# Patient Record
Sex: Male | Born: 1960
Health system: Southern US, Community
[De-identification: ages and names within clinical notes are randomized; demographics above are authoritative.]

## PROBLEM LIST (undated history)

## (undated) DIAGNOSIS — E119 Type 2 diabetes mellitus without complications: Secondary | ICD-10-CM

## (undated) DIAGNOSIS — E7800 Pure hypercholesterolemia, unspecified: Secondary | ICD-10-CM

## (undated) DIAGNOSIS — I513 Intracardiac thrombosis, not elsewhere classified: Secondary | ICD-10-CM

## (undated) DIAGNOSIS — I214 Non-ST elevation (NSTEMI) myocardial infarction: Secondary | ICD-10-CM

---

## 2016-02-21 ENCOUNTER — Emergency Department (HOSPITAL_COMMUNITY): Payer: Self-pay

## 2016-02-21 ENCOUNTER — Other Ambulatory Visit: Payer: Self-pay

## 2016-02-21 ENCOUNTER — Inpatient Hospital Stay (HOSPITAL_COMMUNITY)
Admission: EM | Admit: 2016-02-21 | Discharge: 2016-03-16 | DRG: 246 | Disposition: A | Discharge status: Home Health | Payer: Self-pay | Attending: Internal Medicine | Admitting: Internal Medicine

## 2016-02-21 ENCOUNTER — Emergency Department (HOSPITAL_COMMUNITY): Payer: MEDICAID

## 2016-02-21 ENCOUNTER — Inpatient Hospital Stay (HOSPITAL_COMMUNITY): Payer: MEDICAID | Admitting: Anesthesiology

## 2016-02-21 ENCOUNTER — Inpatient Hospital Stay (HOSPITAL_COMMUNITY): Payer: Self-pay | Admitting: Anesthesiology

## 2016-02-21 ENCOUNTER — Encounter (HOSPITAL_COMMUNITY)
Admission: EM | Disposition: A | Discharge status: Home Health | Payer: Self-pay | Source: Home / Self Care | Attending: Internal Medicine

## 2016-02-21 ENCOUNTER — Encounter (HOSPITAL_COMMUNITY): Payer: Self-pay | Admitting: *Deleted

## 2016-02-21 DIAGNOSIS — Z419 Encounter for procedure for purposes other than remedying health state, unspecified: Secondary | ICD-10-CM

## 2016-02-21 DIAGNOSIS — I998 Other disorder of circulatory system: Secondary | ICD-10-CM

## 2016-02-21 DIAGNOSIS — R079 Chest pain, unspecified: Secondary | ICD-10-CM

## 2016-02-21 DIAGNOSIS — R109 Unspecified abdominal pain: Secondary | ICD-10-CM | POA: Insufficient documentation

## 2016-02-21 DIAGNOSIS — M79A21 Nontraumatic compartment syndrome of right lower extremity: Secondary | ICD-10-CM | POA: Diagnosis present

## 2016-02-21 DIAGNOSIS — R509 Fever, unspecified: Secondary | ICD-10-CM | POA: Insufficient documentation

## 2016-02-21 DIAGNOSIS — I513 Intracardiac thrombosis, not elsewhere classified: Secondary | ICD-10-CM | POA: Diagnosis present

## 2016-02-21 DIAGNOSIS — Y838 Other surgical procedures as the cause of abnormal reaction of the patient, or of later complication, without mention of misadventure at the time of the procedure: Secondary | ICD-10-CM | POA: Diagnosis present

## 2016-02-21 DIAGNOSIS — R9431 Abnormal electrocardiogram [ECG] [EKG]: Secondary | ICD-10-CM | POA: Diagnosis present

## 2016-02-21 DIAGNOSIS — Z9889 Other specified postprocedural states: Secondary | ICD-10-CM

## 2016-02-21 DIAGNOSIS — R7989 Other specified abnormal findings of blood chemistry: Secondary | ICD-10-CM | POA: Diagnosis present

## 2016-02-21 DIAGNOSIS — I2109 ST elevation (STEMI) myocardial infarction involving other coronary artery of anterior wall: Secondary | ICD-10-CM | POA: Diagnosis present

## 2016-02-21 DIAGNOSIS — I5041 Acute combined systolic (congestive) and diastolic (congestive) heart failure: Secondary | ICD-10-CM | POA: Insufficient documentation

## 2016-02-21 DIAGNOSIS — E113393 Type 2 diabetes mellitus with moderate nonproliferative diabetic retinopathy without macular edema, bilateral: Secondary | ICD-10-CM

## 2016-02-21 DIAGNOSIS — E119 Type 2 diabetes mellitus without complications: Secondary | ICD-10-CM

## 2016-02-21 DIAGNOSIS — R9389 Abnormal findings on diagnostic imaging of other specified body structures: Secondary | ICD-10-CM | POA: Insufficient documentation

## 2016-02-21 DIAGNOSIS — I97638 Postprocedural hematoma of a circulatory system organ or structure following other circulatory system procedure: Secondary | ICD-10-CM | POA: Diagnosis not present

## 2016-02-21 DIAGNOSIS — I219 Acute myocardial infarction, unspecified: Secondary | ICD-10-CM | POA: Diagnosis present

## 2016-02-21 DIAGNOSIS — I252 Old myocardial infarction: Secondary | ICD-10-CM

## 2016-02-21 DIAGNOSIS — K8020 Calculus of gallbladder without cholecystitis without obstruction: Secondary | ICD-10-CM | POA: Diagnosis present

## 2016-02-21 DIAGNOSIS — I743 Embolism and thrombosis of arteries of the lower extremities: Secondary | ICD-10-CM

## 2016-02-21 DIAGNOSIS — I214 Non-ST elevation (NSTEMI) myocardial infarction: Secondary | ICD-10-CM | POA: Diagnosis present

## 2016-02-21 DIAGNOSIS — M79604 Pain in right leg: Secondary | ICD-10-CM | POA: Diagnosis present

## 2016-02-21 DIAGNOSIS — Z955 Presence of coronary angioplasty implant and graft: Secondary | ICD-10-CM | POA: Insufficient documentation

## 2016-02-21 DIAGNOSIS — I749 Embolism and thrombosis of unspecified artery: Secondary | ICD-10-CM | POA: Insufficient documentation

## 2016-02-21 DIAGNOSIS — D62 Acute posthemorrhagic anemia: Secondary | ICD-10-CM | POA: Diagnosis not present

## 2016-02-21 DIAGNOSIS — R52 Pain, unspecified: Secondary | ICD-10-CM | POA: Insufficient documentation

## 2016-02-21 DIAGNOSIS — R55 Syncope and collapse: Secondary | ICD-10-CM | POA: Diagnosis not present

## 2016-02-21 DIAGNOSIS — Z87891 Personal history of nicotine dependence: Secondary | ICD-10-CM

## 2016-02-21 DIAGNOSIS — K5900 Constipation, unspecified: Secondary | ICD-10-CM | POA: Diagnosis not present

## 2016-02-21 DIAGNOSIS — E871 Hypo-osmolality and hyponatremia: Secondary | ICD-10-CM | POA: Diagnosis present

## 2016-02-21 DIAGNOSIS — E1165 Type 2 diabetes mellitus with hyperglycemia: Secondary | ICD-10-CM | POA: Diagnosis present

## 2016-02-21 DIAGNOSIS — R778 Other specified abnormalities of plasma proteins: Secondary | ICD-10-CM | POA: Diagnosis present

## 2016-02-21 HISTORY — DX: Intracardiac thrombosis, not elsewhere classified: I51.3

## 2016-02-21 HISTORY — DX: Type 2 diabetes mellitus without complications: E11.9

## 2016-02-21 HISTORY — PX: EMBOLECTOMY: SHX44

## 2016-02-21 HISTORY — DX: Non-ST elevation (NSTEMI) myocardial infarction: I21.4

## 2016-02-21 LAB — CBC WITH DIFFERENTIAL/PLATELET
BASOS ABS: 0 10*3/uL (ref 0.0–0.1)
BASOS PCT: 0 %
Eosinophils Absolute: 0 10*3/uL (ref 0.0–0.7)
Eosinophils Relative: 0 %
HCT: 42.8 % (ref 39.0–52.0)
HEMOGLOBIN: 15.5 g/dL (ref 13.0–17.0)
Lymphocytes Relative: 17 %
Lymphs Abs: 1.7 10*3/uL (ref 0.7–4.0)
MCH: 29.9 pg (ref 26.0–34.0)
MCHC: 36.2 g/dL — ABNORMAL HIGH (ref 30.0–36.0)
MCV: 82.6 fL (ref 78.0–100.0)
MONO ABS: 0.4 10*3/uL (ref 0.1–1.0)
Monocytes Relative: 4 %
NEUTROS ABS: 7.9 10*3/uL — AB (ref 1.7–7.7)
NEUTROS PCT: 79 %
Platelets: 250 10*3/uL (ref 150–400)
RBC: 5.18 MIL/uL (ref 4.22–5.81)
RDW: 12 % (ref 11.5–15.5)
WBC: 10.1 10*3/uL (ref 4.0–10.5)

## 2016-02-21 LAB — MRSA PCR SCREENING: MRSA BY PCR: NEGATIVE

## 2016-02-21 LAB — ECHOCARDIOGRAM COMPLETE
E decel time: 183 msec
E/e' ratio: 13.93
FS: 26 % — AB (ref 28–44)
IV/PV OW: 1.1
LA vol A4C: 36.8 ml
LADIAMINDEX: 1.74 cm/m2
LASIZE: 36 mm
LAVOL: 37.9 mL
LAVOLIN: 18.3 mL/m2
LEFT ATRIUM END SYS DIAM: 36 mm
LV E/e'average: 13.93
LV PW d: 13.7 mm — AB (ref 0.6–1.1)
LVEEMED: 13.93
LVELAT: 7.07 cm/s
MV Dec: 183
MV pk E vel: 98.5 m/s
MVPG: 4 mmHg
MVPKAVEL: 88.3 m/s
P 1/2 time: 439 ms
TDI e' lateral: 7.07
TDI e' medial: 4.03

## 2016-02-21 LAB — BASIC METABOLIC PANEL
Anion gap: 7 (ref 5–15)
BUN: 14 mg/dL (ref 6–20)
CALCIUM: 8.6 mg/dL — AB (ref 8.9–10.3)
CHLORIDE: 100 mmol/L — AB (ref 101–111)
CO2: 25 mmol/L (ref 22–32)
Creatinine, Ser: 0.94 mg/dL (ref 0.61–1.24)
Glucose, Bld: 216 mg/dL — ABNORMAL HIGH (ref 65–99)
Potassium: 4.5 mmol/L (ref 3.5–5.1)
SODIUM: 132 mmol/L — AB (ref 135–145)

## 2016-02-21 LAB — I-STAT TROPONIN, ED: Troponin i, poc: 0.85 ng/mL (ref 0.00–0.08)

## 2016-02-21 LAB — APTT: aPTT: 25 seconds (ref 24–37)

## 2016-02-21 LAB — CBC
HEMATOCRIT: 41.3 % (ref 39.0–52.0)
HEMOGLOBIN: 14.2 g/dL (ref 13.0–17.0)
MCH: 28.9 pg (ref 26.0–34.0)
MCHC: 34.4 g/dL (ref 30.0–36.0)
MCV: 84.1 fL (ref 78.0–100.0)
Platelets: 266 10*3/uL (ref 150–400)
RBC: 4.91 MIL/uL (ref 4.22–5.81)
RDW: 12.1 % (ref 11.5–15.5)
WBC: 11.3 10*3/uL — ABNORMAL HIGH (ref 4.0–10.5)

## 2016-02-21 LAB — TROPONIN I
Troponin I: 0.84 ng/mL (ref <0.031)
Troponin I: 0.94 ng/mL (ref <0.031)

## 2016-02-21 LAB — GLUCOSE, CAPILLARY
Glucose-Capillary: 176 mg/dL — ABNORMAL HIGH (ref 65–99)
Glucose-Capillary: 161 mg/dL — ABNORMAL HIGH (ref 65–99)

## 2016-02-21 LAB — PROTIME-INR
INR: 1.11 (ref 0.00–1.49)
Prothrombin Time: 14.5 seconds (ref 11.6–15.2)

## 2016-02-21 SURGERY — EMBOLECTOMY
Laterality: Right

## 2016-02-21 MED ORDER — DOCUSATE SODIUM 100 MG PO CAPS
100.0000 mg | ORAL_CAPSULE | Freq: Every day | ORAL | Status: DC
  Administered 2016-02-23 – 2016-03-16 (×21): 100 mg via ORAL
  Filled 2016-02-21 (×22): qty 1

## 2016-02-21 MED ORDER — ONDANSETRON HCL 4 MG/2ML IJ SOLN
4.0000 mg | Freq: Four times a day (QID) | INTRAMUSCULAR | Status: DC | PRN
  Administered 2016-02-22: 4 mg via INTRAVENOUS

## 2016-02-21 MED ORDER — BUPIVACAINE HCL (PF) 0.5 % IJ SOLN
INTRAMUSCULAR | Status: AC
  Filled 2016-02-21: qty 30

## 2016-02-21 MED ORDER — BUPIVACAINE-EPINEPHRINE (PF) 0.5% -1:200000 IJ SOLN
INTRAMUSCULAR | Status: AC
  Filled 2016-02-21: qty 30

## 2016-02-21 MED ORDER — SODIUM CHLORIDE 0.9 % IV SOLN: INTRAVENOUS | Status: DC

## 2016-02-21 MED ORDER — 0.9 % SODIUM CHLORIDE (POUR BTL) OPTIME
TOPICAL | Status: DC | PRN
  Administered 2016-02-21: 2000 mL

## 2016-02-21 MED ORDER — PROPOFOL 10 MG/ML IV BOLUS
INTRAVENOUS | Status: AC
  Filled 2016-02-21: qty 40

## 2016-02-21 MED ORDER — ACETAMINOPHEN 650 MG RE SUPP: 325.0000 mg | RECTAL | Status: DC | PRN

## 2016-02-21 MED ORDER — MAGNESIUM SULFATE 2 GM/50ML IV SOLN: 2.0000 g | Freq: Every day | INTRAVENOUS | Status: DC | PRN

## 2016-02-21 MED ORDER — METOPROLOL TARTRATE 5 MG/5ML IV SOLN: 2.0000 mg | INTRAVENOUS | Status: DC | PRN

## 2016-02-21 MED ORDER — ALUM & MAG HYDROXIDE-SIMETH 200-200-20 MG/5ML PO SUSP
15.0000 mL | ORAL | Status: DC | PRN
  Administered 2016-03-03: 30 mL via ORAL
  Filled 2016-02-21 (×3): qty 30

## 2016-02-21 MED ORDER — MORPHINE SULFATE (PF) 2 MG/ML IV SOLN
2.0000 mg | INTRAVENOUS | Status: DC | PRN
  Administered 2016-02-22 (×4): 2 mg via INTRAVENOUS
  Administered 2016-02-24: 1 mg via INTRAVENOUS
  Administered 2016-02-24: 3 mg via INTRAVENOUS
  Filled 2016-02-21: qty 2
  Filled 2016-02-21 (×2): qty 1

## 2016-02-21 MED ORDER — SUCCINYLCHOLINE CHLORIDE 200 MG/10ML IV SOSY
PREFILLED_SYRINGE | INTRAVENOUS | Status: AC
  Filled 2016-02-21: qty 20

## 2016-02-21 MED ORDER — IOPAMIDOL (ISOVUE-370) INJECTION 76%
100.0000 mL | Freq: Once | INTRAVENOUS | Status: AC | PRN
  Administered 2016-02-21: 100 mL via INTRAVENOUS

## 2016-02-21 MED ORDER — MIDAZOLAM HCL 2 MG/2ML IJ SOLN
INTRAMUSCULAR | Status: AC
  Filled 2016-02-21: qty 2

## 2016-02-21 MED ORDER — ASPIRIN 81 MG PO CHEW
324.0000 mg | CHEWABLE_TABLET | Freq: Once | ORAL | Status: AC
  Administered 2016-02-21: 324 mg via ORAL
  Filled 2016-02-21: qty 4

## 2016-02-21 MED ORDER — SODIUM CHLORIDE 0.9 % IV SOLN: 500.0000 mL | Freq: Once | INTRAVENOUS | Status: DC | PRN

## 2016-02-21 MED ORDER — ACETAMINOPHEN 650 MG RE SUPP: 650.0000 mg | Freq: Four times a day (QID) | RECTAL | Status: DC | PRN

## 2016-02-21 MED ORDER — POTASSIUM CHLORIDE CRYS ER 20 MEQ PO TBCR: 20.0000 meq | EXTENDED_RELEASE_TABLET | Freq: Every day | ORAL | Status: DC | PRN

## 2016-02-21 MED ORDER — ACETAMINOPHEN 325 MG PO TABS
650.0000 mg | ORAL_TABLET | Freq: Four times a day (QID) | ORAL | Status: DC | PRN
  Administered 2016-02-26 – 2016-03-08 (×5): 650 mg via ORAL
  Filled 2016-02-21 (×6): qty 2

## 2016-02-21 MED ORDER — LIDOCAINE-EPINEPHRINE (PF) 1 %-1:200000 IJ SOLN
INTRAMUSCULAR | Status: AC
Start: 2016-02-21 — End: 2016-02-21
  Filled 2016-02-21: qty 30

## 2016-02-21 MED ORDER — MIDAZOLAM HCL 2 MG/2ML IJ SOLN: 0.5000 mg | Freq: Once | INTRAMUSCULAR | Status: DC | PRN

## 2016-02-21 MED ORDER — THROMBIN 20000 UNITS EX SOLR
CUTANEOUS | Status: AC
  Filled 2016-02-21: qty 20000

## 2016-02-21 MED ORDER — HEPARIN BOLUS VIA INFUSION
4000.0000 [IU] | INTRAVENOUS | Status: AC
  Administered 2016-02-21: 4000 [IU] via INTRAVENOUS
  Filled 2016-02-21: qty 4000

## 2016-02-21 MED ORDER — HEPARIN (PORCINE) IN NACL 100-0.45 UNIT/ML-% IJ SOLN
1100.0000 [IU]/h | INTRAMUSCULAR | Status: DC
  Administered 2016-02-21: 1100 [IU]/h via INTRAVENOUS
  Filled 2016-02-21: qty 250

## 2016-02-21 MED ORDER — LIDOCAINE HCL (PF) 1 % IJ SOLN
INTRAMUSCULAR | Status: AC
  Filled 2016-02-21: qty 90

## 2016-02-21 MED ORDER — LIDOCAINE-EPINEPHRINE (PF) 1 %-1:200000 IJ SOLN
INTRAMUSCULAR | Status: DC | PRN
  Administered 2016-02-21: 14 mL

## 2016-02-21 MED ORDER — THROMBIN 20000 UNITS EX SOLR: CUTANEOUS | Status: DC | PRN

## 2016-02-21 MED ORDER — CEFUROXIME SODIUM 1.5 G IJ SOLR
1.5000 g | INTRAMUSCULAR | Status: AC
  Administered 2016-02-21: 1.5 g via INTRAMUSCULAR

## 2016-02-21 MED ORDER — FENTANYL CITRATE (PF) 250 MCG/5ML IJ SOLN
INTRAMUSCULAR | Status: AC
  Filled 2016-02-21: qty 5

## 2016-02-21 MED ORDER — PANTOPRAZOLE SODIUM 40 MG PO TBEC
40.0000 mg | DELAYED_RELEASE_TABLET | Freq: Every day | ORAL | Status: DC
  Administered 2016-02-23 – 2016-03-06 (×12): 40 mg via ORAL
  Filled 2016-02-21 (×12): qty 1

## 2016-02-21 MED ORDER — ASPIRIN 325 MG PO TABS
325.0000 mg | ORAL_TABLET | Freq: Every day | ORAL | Status: DC
  Administered 2016-02-23 – 2016-03-06 (×12): 325 mg via ORAL
  Filled 2016-02-21 (×13): qty 1

## 2016-02-21 MED ORDER — BISACODYL 10 MG RE SUPP
10.0000 mg | Freq: Every day | RECTAL | Status: DC | PRN
  Administered 2016-03-06 (×2): 10 mg via RECTAL
  Filled 2016-02-21 (×3): qty 1

## 2016-02-21 MED ORDER — INSULIN ASPART 100 UNIT/ML ~~LOC~~ SOLN: 0.0000 [IU] | Freq: Every day | SUBCUTANEOUS | Status: DC

## 2016-02-21 MED ORDER — BUPIVACAINE HCL (PF) 0.5 % IJ SOLN
INTRAMUSCULAR | Status: DC | PRN
  Administered 2016-02-21: 30 mL

## 2016-02-21 MED ORDER — FENTANYL CITRATE (PF) 100 MCG/2ML IJ SOLN
50.0000 ug | INTRAMUSCULAR | Status: AC | PRN
  Administered 2016-02-21 (×2): 50 ug via NASAL
  Filled 2016-02-21 (×2): qty 2

## 2016-02-21 MED ORDER — ONDANSETRON HCL 4 MG/2ML IJ SOLN: 4.0000 mg | Freq: Four times a day (QID) | INTRAMUSCULAR | Status: DC | PRN

## 2016-02-21 MED ORDER — MIDAZOLAM HCL 5 MG/5ML IJ SOLN
INTRAMUSCULAR | Status: DC | PRN
  Administered 2016-02-21 (×2): 1 mg via INTRAVENOUS

## 2016-02-21 MED ORDER — POLYETHYLENE GLYCOL 3350 17 G PO PACK
17.0000 g | PACK | Freq: Every day | ORAL | Status: DC | PRN
  Administered 2016-02-24: 17 g via ORAL
  Filled 2016-02-21: qty 1

## 2016-02-21 MED ORDER — HEPARIN (PORCINE) IN NACL 100-0.45 UNIT/ML-% IJ SOLN
14.0000 [IU]/kg/h | INTRAMUSCULAR | Status: DC
Start: 2016-02-21 — End: 2016-02-21
  Administered 2016-02-21: 14 [IU]/kg/h via INTRAVENOUS
  Filled 2016-02-21: qty 250

## 2016-02-21 MED ORDER — PROMETHAZINE HCL 25 MG/ML IJ SOLN: 6.2500 mg | INTRAMUSCULAR | Status: DC | PRN

## 2016-02-21 MED ORDER — OXYCODONE-ACETAMINOPHEN 5-325 MG PO TABS
1.0000 | ORAL_TABLET | ORAL | Status: DC | PRN
  Administered 2016-02-22 – 2016-02-24 (×9): 2 via ORAL
  Administered 2016-02-25: 1 via ORAL
  Administered 2016-02-25 – 2016-03-09 (×31): 2 via ORAL
  Administered 2016-03-10: 1 via ORAL
  Administered 2016-03-10 – 2016-03-15 (×8): 2 via ORAL
  Administered 2016-03-16: 1 via ORAL
  Administered 2016-03-16: 2 via ORAL
  Filled 2016-02-21 (×23): qty 2
  Filled 2016-02-21: qty 1
  Filled 2016-02-21 (×3): qty 2
  Filled 2016-02-21: qty 1
  Filled 2016-02-21 (×27): qty 2

## 2016-02-21 MED ORDER — LABETALOL HCL 5 MG/ML IV SOLN
10.0000 mg | INTRAVENOUS | Status: DC | PRN
  Filled 2016-02-21: qty 4

## 2016-02-21 MED ORDER — HEPARIN SODIUM (PORCINE) 1000 UNIT/ML IJ SOLN
INTRAMUSCULAR | Status: DC | PRN
  Administered 2016-02-21: 9500 [IU] via INTRAVENOUS

## 2016-02-21 MED ORDER — MEPERIDINE HCL 25 MG/ML IJ SOLN: 6.2500 mg | INTRAMUSCULAR | Status: DC | PRN

## 2016-02-21 MED ORDER — LIDOCAINE HCL 1 % IJ SOLN
INTRAMUSCULAR | Status: DC | PRN
  Administered 2016-02-21: 30 mL

## 2016-02-21 MED ORDER — FENTANYL CITRATE (PF) 250 MCG/5ML IJ SOLN
INTRAMUSCULAR | Status: DC | PRN
  Administered 2016-02-21 (×5): 50 ug via INTRAVENOUS

## 2016-02-21 MED ORDER — LIDOCAINE-EPINEPHRINE (PF) 1 %-1:200000 IJ SOLN
INTRAMUSCULAR | Status: DC | PRN
  Administered 2016-02-21: 30 mL

## 2016-02-21 MED ORDER — SODIUM CHLORIDE 0.9 % IV SOLN
INTRAVENOUS | Status: DC
  Administered 2016-02-21 – 2016-02-25 (×5): via INTRAVENOUS
  Administered 2016-02-25: 75 mL via INTRAVENOUS

## 2016-02-21 MED ORDER — ONDANSETRON HCL 4 MG PO TABS: 4.0000 mg | ORAL_TABLET | Freq: Four times a day (QID) | ORAL | Status: DC | PRN

## 2016-02-21 MED ORDER — ACETAMINOPHEN 325 MG PO TABS: 325.0000 mg | ORAL_TABLET | ORAL | Status: DC | PRN

## 2016-02-21 MED ORDER — LACTATED RINGERS IV SOLN
INTRAVENOUS | Status: DC | PRN
  Administered 2016-02-21: 17:00:00 via INTRAVENOUS

## 2016-02-21 MED ORDER — SODIUM CHLORIDE 0.9 % IV SOLN
INTRAVENOUS | Status: DC | PRN
  Administered 2016-02-21: 16:00:00

## 2016-02-21 MED ORDER — HEMOSTATIC AGENTS (NO CHARGE) OPTIME
TOPICAL | Status: DC | PRN
  Administered 2016-02-21: 1 via TOPICAL

## 2016-02-21 MED ORDER — BUPIVACAINE HCL (PF) 0.5 % IJ SOLN
INTRAMUSCULAR | Status: DC | PRN
  Administered 2016-02-21: 14 mL

## 2016-02-21 MED ORDER — IOPAMIDOL (ISOVUE-300) INJECTION 61%
INTRAVENOUS | Status: AC
  Filled 2016-02-21: qty 50

## 2016-02-21 MED ORDER — MORPHINE SULFATE (PF) 2 MG/ML IV SOLN
1.0000 mg | INTRAVENOUS | Status: DC | PRN
  Administered 2016-02-22: 2 mg via INTRAVENOUS
  Filled 2016-02-21 (×3): qty 1

## 2016-02-21 MED ORDER — LIDOCAINE-EPINEPHRINE (PF) 1 %-1:200000 IJ SOLN
INTRAMUSCULAR | Status: AC
  Filled 2016-02-21: qty 30

## 2016-02-21 MED ORDER — DEXTROSE 5 % IV SOLN
1.5000 g | Freq: Two times a day (BID) | INTRAVENOUS | Status: AC
  Administered 2016-02-22 (×2): 1.5 g via INTRAVENOUS
  Filled 2016-02-21 (×3): qty 1.5

## 2016-02-21 MED ORDER — IOPAMIDOL (ISOVUE-370) INJECTION 76%: 100.0000 mL | Freq: Once | INTRAVENOUS | Status: DC | PRN

## 2016-02-21 MED ORDER — LIDOCAINE HCL 1 % IJ SOLN: INTRAMUSCULAR | Status: DC | PRN

## 2016-02-21 MED ORDER — FLEET ENEMA 7-19 GM/118ML RE ENEM
1.0000 | ENEMA | Freq: Once | RECTAL | Status: DC | PRN
  Filled 2016-02-21: qty 1

## 2016-02-21 MED ORDER — INSULIN ASPART 100 UNIT/ML ~~LOC~~ SOLN
0.0000 [IU] | Freq: Three times a day (TID) | SUBCUTANEOUS | Status: DC
  Administered 2016-02-22: 2 [IU] via SUBCUTANEOUS
  Administered 2016-02-22: 5 [IU] via SUBCUTANEOUS

## 2016-02-21 MED ORDER — ONDANSETRON HCL 4 MG/2ML IJ SOLN: 4.0000 mg | Freq: Three times a day (TID) | INTRAMUSCULAR | Status: DC | PRN

## 2016-02-21 MED ORDER — HYDRALAZINE HCL 20 MG/ML IJ SOLN: 5.0000 mg | INTRAMUSCULAR | Status: DC | PRN

## 2016-02-21 SURGICAL SUPPLY — 51 items
ARMBAND PINK RESTRICT EXTREMIT (MISCELLANEOUS) IMPLANT
BANDAGE ACE 4X5 VEL STRL LF (GAUZE/BANDAGES/DRESSINGS) ×3 IMPLANT
BNDG GAUZE ELAST 4 BULKY (GAUZE/BANDAGES/DRESSINGS) ×3 IMPLANT
CANISTER SUCTION 2500CC (MISCELLANEOUS) ×3 IMPLANT
CATH EMB 3FR 40CM (CATHETERS) ×3 IMPLANT
CATH EMB 4FR 80CM (CATHETERS) ×3 IMPLANT
CLIP TI MEDIUM 6 (CLIP) ×3 IMPLANT
CLIP TI WIDE RED SMALL 24 (CLIP) ×3 IMPLANT
CLIP TI WIDE RED SMALL 6 (CLIP) ×3 IMPLANT
CONT SPEC STER OR (MISCELLANEOUS) ×3 IMPLANT
COVER PROBE W GEL 5X96 (DRAPES) ×3 IMPLANT
DERMABOND ADVANCED (GAUZE/BANDAGES/DRESSINGS) ×2
DERMABOND ADVANCED .7 DNX12 (GAUZE/BANDAGES/DRESSINGS) ×1 IMPLANT
DRAIN HEMOVAC 1/8 X 5 (WOUND CARE) ×3 IMPLANT
ELECT REM PT RETURN 9FT ADLT (ELECTROSURGICAL) ×3
ELECTRODE REM PT RTRN 9FT ADLT (ELECTROSURGICAL) ×1 IMPLANT
GEL ULTRASOUND 20GR AQUASONIC (MISCELLANEOUS) IMPLANT
GLOVE BIO SURGEON STRL SZ 6.5 (GLOVE) ×8 IMPLANT
GLOVE BIO SURGEON STRL SZ7 (GLOVE) ×3 IMPLANT
GLOVE BIO SURGEONS STRL SZ 6.5 (GLOVE) ×4
GLOVE BIOGEL PI IND STRL 6.5 (GLOVE) ×6 IMPLANT
GLOVE BIOGEL PI IND STRL 7.5 (GLOVE) ×1 IMPLANT
GLOVE BIOGEL PI INDICATOR 6.5 (GLOVE) ×12
GLOVE BIOGEL PI INDICATOR 7.5 (GLOVE) ×2
GOWN STRL REUS W/ TWL LRG LVL3 (GOWN DISPOSABLE) ×4 IMPLANT
GOWN STRL REUS W/TWL LRG LVL3 (GOWN DISPOSABLE) ×8
HEMOSTAT SPONGE AVITENE ULTRA (HEMOSTASIS) ×3 IMPLANT
KIT BASIN OR (CUSTOM PROCEDURE TRAY) ×3 IMPLANT
KIT ROOM TURNOVER OR (KITS) ×3 IMPLANT
LIQUID BAND (GAUZE/BANDAGES/DRESSINGS) ×3 IMPLANT
NEEDLE 27GAX1X1/2 (NEEDLE) ×3 IMPLANT
NS IRRIG 1000ML POUR BTL (IV SOLUTION) ×6 IMPLANT
PACK PERIPHERAL VASCULAR (CUSTOM PROCEDURE TRAY) ×3 IMPLANT
PAD ARMBOARD 7.5X6 YLW CONV (MISCELLANEOUS) ×6 IMPLANT
SPONGE GAUZE 4X4 12PLY STER LF (GAUZE/BANDAGES/DRESSINGS) ×3 IMPLANT
SPONGE LAP 18X18 X RAY DECT (DISPOSABLE) ×3 IMPLANT
SPONGE SURGIFOAM ABS GEL 100 (HEMOSTASIS) IMPLANT
SUT ETHILON 3 0 PS 1 (SUTURE) ×3 IMPLANT
SUT MNCRL AB 4-0 PS2 18 (SUTURE) ×3 IMPLANT
SUT PROLENE 6 0 BV (SUTURE) ×6 IMPLANT
SUT PROLENE 6 0 C 1 24 (SUTURE) ×3 IMPLANT
SUT VIC AB 2-0 CTX 36 (SUTURE) ×3 IMPLANT
SUT VIC AB 3-0 SH 27 (SUTURE) ×4
SUT VIC AB 3-0 SH 27X BRD (SUTURE) ×2 IMPLANT
SYR 3ML LL SCALE MARK (SYRINGE) ×6 IMPLANT
SYR BULB IRRIGATION 50ML (SYRINGE) ×3 IMPLANT
SYR CONTROL 10ML LL (SYRINGE) ×3 IMPLANT
SYRINGE 1CC SLIP TB (MISCELLANEOUS) ×3 IMPLANT
TRAY FOLEY CATH 16FRSI W/METER (SET/KITS/TRAYS/PACK) ×3 IMPLANT
UNDERPAD 30X30 INCONTINENT (UNDERPADS AND DIAPERS) ×3 IMPLANT
WATER STERILE IRR 1000ML POUR (IV SOLUTION) ×3 IMPLANT

## 2016-02-21 NOTE — Progress Notes (Signed)
ANTICOAGULATION CONSULT NOTE   Pharmacy Consult for Heparin Indication: Subacute MI, vascular occulusion   No Known Allergies  Patient Measurements: Height: 5\' 8"  (172.7 cm) Weight: 207 lb (93.895 kg) IBW/kg (Calculated) : 68.4 Heparin Dosing Weight: 88kg  Vital Signs: Temp: 98.3 F (36.8 C) (06/25 2006) Temp Source: Oral (06/25 1421) BP: 112/83 mmHg (06/25 2045) Pulse Rate: 81 (06/25 2100)  Labs:  Recent Labs  02/21/16 0827 02/21/16 1342 02/21/16 2026  HGB 15.5  --   --   HCT 42.8  --   --   PLT 250  --   --   APTT  --  25  --   LABPROT  --  14.5  --   INR  --  1.11  --   CREATININE 0.94  --   --   TROPONINI 0.84*  --  0.94*    Estimated Creatinine Clearance: 97.6 mL/min (by C-G formula based on Cr of 0.94).   Medical History: Past Medical History  Diagnosis Date  . Diabetes (HCC)   . NSTEMI (non-ST elevated myocardial infarction) (HCC)   . LV (left ventricular) mural thrombus (HCC)     Medications:  Scheduled:  . sodium chloride   Intravenous STAT  . [MAR Hold] aspirin  325 mg Oral Daily  . [MAR Hold] insulin aspart  0-15 Units Subcutaneous TID WC  . [MAR Hold] insulin aspart  0-5 Units Subcutaneous QHS   Infusions:  . heparin 1,100 Units/hr (02/21/16 1651)    Assessment: 55 yo male pain in his right leg with associated numbness in his toes.  Concern for embolic event. Patient also c/o chest pain ~2 weeks ago; ECG shows subacute anterior MI - Cardiology consulted and starting IV heparin per pharmacy protocol.  New dx subacute MI with LV thrombus.>>embolization to R leg Patient now s/p R femoral embolectomy and fasciotomies  Heparin continued at 1100 units/hr through procedure. 9500 unit bolus intra-op. Initial level was due prior to OR so will allow bolus to settle before checking level.  Goal of Therapy:  Heparin level 0.3-0.7 units/ml Monitor platelets by anticoagulation protocol: Yes   Plan:   Restart Heparin IV infusion 1100 units/hr    Check heparin level in 6 hrs  Daily heparin level and CBC  Sheppard Coil PharmD., BCPS Clinical Pharmacist Pager [phone] 02/21/2016 9:18 PM

## 2016-02-21 NOTE — Op Note (Addendum)
OPERATIVE NOTE   PROCEDURE: 1. Right femoral embolectomy 2. Right calf four compartment fasciotomies  PRE-OPERATIVE DIAGNOSIS: subacute myocardial infarction with left ventricle thrombus, embolism to right femoral arteries  POST-OPERATIVE DIAGNOSIS: same as above   SURGEON: Leonides Sake, MD  ASSISTANT(S): RNFA  ANESTHESIA: local and MAC  ESTIMATED BLOOD LOSS: 350 cc  FINDING(S): 1.  Defined thrombus in right profunda femoral artery and superficial femoral artery  2.  Faintly palpable anterior tibial artery and posterior tibial artery at end of case with multiphasic doppler signals 3.  Viable muscle throughout all compartments without any muscle bulging  SPECIMEN(S):  none  INDICATIONS:   Troy Yu is a 55 y.o. male who presents with >12 hours of right leg ischemic symptoms in setting of newly diagnosed subacute myocardial infarction with left ventricle thrombus.  It was felt that the patient had embolized to the right leg.  CTA abd/pelvis with runoff verified this had occurred with embolism found in the right profunda femoral artery and superficial femoral artery.  I recommended: right leg thromboembolectomy and four-compartment calf fasciotomy.  The risk, benefits, and alternative for this operation were discussed with the patient.  The patient is aware the risks include but are not limited to: bleeding, infection, myocardial infarction, stroke, limb loss, nerve damage, need for additional procedures in the future, and wound complications. The patient is aware of these risks and agreed to proceed.  DESCRIPTION: After obtaining full informed written consent, the patient was brought back to the operating room and placed supine upon the operating table.  The patient received IV antibiotics prior to induction.  After obtaining adequate anesthesia, the patient was prepped and draped in the standard fashion for: right leg thromboembolectomy.  I started by imaging the right common  femoral artery with Sonosite.  Proximally there was a pulsation but distally this was gone, consistent with thrombus in the proximal superficial femoral artery and profunda femoral artery on CTA.  I injected a mixture of local anesthetic (1% lidocaine with epinephrine, 1% lidocaine without epinephrine, and 0.5% Marcaine) in the skin and subcutaneous tissue overlying the right common femoral artery.  I made an incision over the right common femoral artery.  Using blunt dissection and electrocautery, I dissected out the common femoral artery from inguinal ligament down to femoral bifurcation.  I placed vessel loops around the proximal common femoral artery, profunda femoral artery and superficial femoral artery.  The patient was given 9500 units of Heparin intravenously, which was a therapeutic bolus.  Additionally, a heparin drip at 1100 units/hr was running, given the presence of left ventricular thrombus.    After waiting 3 minutes, I made a transverse arteriotomy just proximal to the femoral bifurcation.  I extended this with a Potts scissor.  In the process, a thrombus presented itself immediately.  I removed this thrombus which appeared to be somewhat organized.  I passed a 3 Fogarty down the profunda femoral artery, obtaining an organized thrombus.  On subsequent passes, there was no further thrombus, along with return of pulsatile bleeding.  I then repeated this process with a 4 Fogarty down the superficial femoral artery.  I could advance the Fogarty for its entire length, i.e. down to the ankle level.  I retrieved an organized plaque.  On subsequent passes, there was no thrombus.  There was some backbleeding but not pulsatile bleeding.  I also allowed the distal external iliac artery to bleed.  This blood flow was pulsatile, without any thrombus.  I passed the  4 Fogarty balloon proximally, obtaining no thrombus.  I clamped the distal external iliac artery and placed the superficial femoral artery and  profunda femoral artery under tension.  I repaired this common femoral artery with two running stitches of 6-0 Prolene.  Prior to completing this repair, I backbled all arteries.  There was no obvious thrombus.  I completed this repair in the usual fashion.  I made an incision in the thigh and then tunneled through the subcutaneous tissue with a tonsil clamp.  I passed a 10 JP drain through the subcutaneous tissue.  I sharply shortened the drain for the dimensions of his incision.  I secured the drain to the skin with a 3-0 Nylon tied to the drain.  I turned my attention distally to start the fasciotomies.  Preoperatively, I had discussed with the patient trying to complete the fasciotomies with skip incisions rather than a long incision on each side of his right calf.  I could faintly feel an anterior tibial artery pulse and faintly a posterior tibial artery pulse.  I turned my attention distally to the lateral calf.  I injected the prior mixture of local anesthesia in the skin along the lateral calf, in line with expected fasciotomies.  I made an incision in the mid-calf roughly 2/3 of the way between the tibia and fibula.  I dissected down the fascia and opened it sharply.  I verified entry into the anterior compartment.  The muscle appeared alive and reactive to electrical stimulation.  I then open the lateral compartment fascia.  The muscle appeared alive and reactive to electrical stimulation.  I elevated the skin with an retractor and under direct visualization opened the lateral and anterior compartments sharply with a Metzenbaum scissor down to just above the ankle.  I made another skin incision in the proximal 1/3 of the calf lateral, in line with fasciotomies.  I dissected down to the fascia and repeat this process, connecting the fasciotomies made from this incision down the prior incision.  I also extended these fasciotomies proximally to the proximal calf.  I packed these incisions with  Avitene.  At this point, I turned my attention to the medial calf.  I injected the prior mixture of local anesthesia in the skin along the medial calf, in line with expected fasciotomies.  I made an incision 1 finger width behind the tibia.  I dissected down to the superficial posterior compartment fascia with electrocautery.  I then bluntly dissected down to the deep posterior compartment, taking down the medial belly of the gastrocnemius.  Neither compartments demonstrated any ischemic muscle and there was no bulging.  All muscle was reactive to electric stimulation.  I extended the fasciotomy proximally under direct visualization.  I then extended this incision distally under direct visualization.  I then had to make another skip incision in the distal 1/3 of the medial calf, in line with the fasciotomy.  I dissected down to the superficial fascia with electrocautery.  I sharply opened this and then extended the fasciotomy distally to above the level of the ankle.  I then extended this proximally to join the prior fasciotomy.  There was some active bleeding at this point.  After further exploration, I had to control some venous bleeding in the subcutaneous tissue with electrocautery.  I repacked these incisions with Avitene.  At this point, the right groin was washed out.  There was no further active bleeding.  The right groin incision was repaired with with a double layer  of 2-0 Vicryl, single layer of 3-0 Vicryl, and a running subcuticular of 4-0 Monocryl in the right groin.  The JP drain was attached to the bulb suction.  At this point, I washed out the lateral fasciotomy incisions.  There was no active bleeding.  I reapproximated the subcutaneous tissue in each incision with 3-0 Vicryl stitches.  The skin in each incisions was then stapled.  I repeated this process on the medial fasciotomy incisions.   Distally there remained faintly palpable anterior tibial artery and posterior tibial artery.  I washed  off the lower leg and wrapped it with Kerlix.  A gentle ACE wrap was applied to the calf to try to get some compression on the calf, given the need for continued anticoagulation.  In total, 118 cc of the local anesthestic mixture was used throughout this case.   COMPLICATIONS: none  CONDITION: stable   Leonides Sake, MD, [facility] [place] Office: [phone] Pager: [phone]  02/21/2016, 8:18 PM

## 2016-02-21 NOTE — ED Notes (Signed)
Cardiology at bedside.

## 2016-02-21 NOTE — ED Notes (Signed)
Spoke with [facility] CRNA will be ready for pt in 15 mins. They are waiting for echo report to result before doing surgery.

## 2016-02-21 NOTE — Progress Notes (Signed)
ANTICOAGULATION CONSULT NOTE - Initial Consult  Pharmacy Consult for Heparin Indication: Subacute MI, vascular occulusion   No Known Allergies  Patient Measurements: Height: 5\' 8"  (172.7 cm) Weight: 207 lb (93.895 kg) IBW/kg (Calculated) : 68.4 Heparin Dosing Weight: 88kg  Vital Signs: Temp: 98.9 F (37.2 C) (06/25 0918) Temp Source: Oral (06/25 0918) BP: 118/82 mmHg (06/25 1300) Pulse Rate: 81 (06/25 1300)  Labs:  Recent Labs  02/21/16 0827  HGB 15.5  HCT 42.8  PLT 250  CREATININE 0.94  TROPONINI 0.84*    Estimated Creatinine Clearance: 97.6 mL/min (by C-G formula based on Cr of 0.94).   Medical History: History reviewed. No pertinent past medical history.  Medications:  Scheduled:  . [START ON 02/22/2016] aspirin  325 mg Oral Daily  . heparin  4,000 Units Intravenous STAT   Infusions:  . heparin     PRN:   Assessment: 55 yo male pain in his right leg with associated numbness in his toes.  Concern for embolic event. Patient also c/o chest pain ~2 weeks ago; ECG shows subacute anterior MI - Cardiology consulted and starting IV heparin per pharmacy protocol.  Goal of Therapy:  Heparin level 0.3-0.7 units/ml Monitor platelets by anticoagulation protocol: Yes   Plan:   Heparin 4000 units IV bolus  Heparin IV infusion 1100 units/hr   Check heparin level in 6 hrs  Daily heparin level and CBC  Loralee Pacas, PharmD, BCPS Pager: [phone] 02/21/2016,1:33 PM

## 2016-02-21 NOTE — ED Provider Notes (Signed)
Patient seen/examined in the Emergency Department in conjunction with Midlevel Provider [facility] Patient reports right LE pain/numbness that is improving.  He also mentions CP recently but not currently Exam : awake/alert, no distress, distal pulses intact in legs, no discoloration is noted, he is ambulatory Plan: plan to image right LE due to concern for possible occlusion As for abnormal EKG, labs pending then would recommend cardiology consult/followup   Zadie Rhine, MD 02/21/16 [phone]

## 2016-02-21 NOTE — Progress Notes (Signed)
D; paged 2 different Dr, for lab result, said need call Dr, Claiborne Billings, sent messages  Await call. Report given to 2H 20 RN via phone wil tx with monitor

## 2016-02-21 NOTE — Anesthesia Postprocedure Evaluation (Signed)
Anesthesia Post Note  Patient: Troy Yu  Procedure(s) Performed: Procedure(s) (LRB): EMBOLECTOMY RIGHT COMMON FEMORAL ARTERY; FOUR COMPARTMENT FASCIOTOMY (Right)  Patient location during evaluation: PACU Anesthesia Type: MAC Level of consciousness: awake and alert, oriented and patient cooperative Pain management: pain level controlled Vital Signs Assessment: post-procedure vital signs reviewed and stable Respiratory status: spontaneous breathing, nonlabored ventilation, respiratory function stable and patient connected to nasal cannula oxygen Cardiovascular status: blood pressure returned to baseline and stable Postop Assessment: no signs of nausea or vomiting Anesthetic complications: no    Last Vitals:  Filed Vitals:   02/21/16 2100 02/21/16 2115  BP: 100/81 104/78  Pulse: 81 87  Temp:    Resp: 20 17    Last Pain:  Filed Vitals:   02/21/16 2121  PainSc: 10-Worst pain ever                 Tramar Brueckner,E. Mecca Barga

## 2016-02-21 NOTE — ED Provider Notes (Signed)
Patient was transferred from [facility] after developing a pulseless right lower extremity.  CT angiogram of the lower extremities have been done prior to transfer.  It showed embolic thrombus at the bifurcation of the right common femoral artery with thrombus into the proximal profundus femoral.  Dr. Imogene Burn was notified and came to the emergency department.  Patient will be taken to surgery.  Patient was admitted to the hospitalist service.  I examined the patient he was awake alert.  Still with absent pulses in the right foot.  Nelva Nay, MD 02/21/16 1600

## 2016-02-21 NOTE — Anesthesia Preprocedure Evaluation (Addendum)
Anesthesia Evaluation  Patient identified by MRN, date of birth, ID band Patient awake    Reviewed: Allergy & Precautions, NPO status , Patient's Chart, lab work & pertinent test resultsPreop documentation limited or incomplete due to emergent nature of procedure.  History of Anesthesia Complications Negative for: history of anesthetic complications  Airway Mallampati: I  TM Distance: >3 FB Neck ROM: Full    Dental  (+) Poor Dentition, Loose, Missing, Dental Advisory Given   Pulmonary former smoker,    breath sounds clear to auscultation       Cardiovascular (-) angina+ Past MI (anterior MI 2 weeks ago, possibly extending today) and + Peripheral Vascular Disease   Rhythm:Regular Rate:Normal  ECHO today 02/21/16: Left ventricle: Anteroapical infarct with large thrombus burden at apex. Mobile and high embolic potential. The cavity size was severely dilated. Wall thickness was normal. Systolic function was moderately to severely reduced. The estimated ejection fraction was in the range of 30% to 35%   Neuro/Psych negative neurological ROS     GI/Hepatic negative GI ROS, Neg liver ROS,   Endo/Other  diabetes (newly diagnosed, glu 216, diet controlled)Morbid obesity  Renal/GU negative Renal ROS     Musculoskeletal   Abdominal (+) + obese,   Peds  Hematology negative hematology ROS (+)   Anesthesia Other Findings   Reproductive/Obstetrics                       Anesthesia Physical Anesthesia Plan  ASA: IV and emergent  Anesthesia Plan: MAC   Post-op Pain Management:    Induction: Intravenous  Airway Management Planned: Natural Airway and Simple Face Mask  Additional Equipment:   Intra-op Plan:   Post-operative Plan:   Informed Consent: I have reviewed the patients History and Physical, chart, labs and discussed the procedure including the risks, benefits and alternatives for the proposed  anesthesia with the patient or authorized representative who has indicated his/her understanding and acceptance.   Dental advisory given, Consent reviewed with POA and Only emergency history available  Plan Discussed with: CRNA and Surgeon  Anesthesia Plan Comments: (Plan routine monitors, MAC with local,  GETA explained if pt/surgeon needs for fasciotomy Pt and family understand risk of further MI/heart damage)        Anesthesia Quick Evaluation

## 2016-02-21 NOTE — H&P (Signed)
History and Physical    Troy Yu JXB:[id number] DOB: [birth year] DOA: 02/21/2016  PCP: No primary care provider on file. Patient coming from: home  Chief Complaint: right leg pain  HPI: Troy Yu is a 55 y.o. male with medical history significant for recent chest pain presents to [place] from [facility] ed with cc right leg pain and recent chest pain. Initial evaluation includes evaluation by cardiology who opined likely large MI weeks ago with now LV thrombus with emboli. He's also been evaluated by vascular surgery who plans taken to the operating room today.  Information is obtained from the patient and the chart. He reports developing right leg pain yesterday. Associated symptoms include numbness and tingling of his toes. He reports he didn't think much of it initially because he's had problems with the leg since he was a child that worsened a little bit after an injury in 2014. Yesterday he was doing some heavy lifting when the pain began. He reports it being continuous for 2 hours radiating from his right lateral knee down to his foot. He rates the pain a 10 out of 10. He then developed coolness. He took ibuprofen without improvement. He denies any chest pain palpitations headache dizziness syncope or near-syncope. He denies any abdominal pain nausea vomiting diarrhea constipation. He denies dysuria hematuria frequency or urgency. He denies cough shortness of breath fever chills or sick contacts. He denies any recent travel.  Of note patient reports approximately 2 weeks ago while on a during fasting time he developed left-sided chest pressure. Associated symptoms at that time included nausea and vomiting. He attributed to being in the hot signed in the setting of fasting. He reports the pain lasted for 3 days. He says he broke his fast and vomited shortly thereafter. The pain resolved and has not reoccurred. At that time he denied any shortness of breath.    ED Course: He is evaluated by cardiology  at [facility] who recommend the initiation of heparin and transfer to [facility]. At [facility] ED he is evaluated by vascular surgery as noted above.  Review of Systems: As per HPI otherwise 10 point review of systems negative.   Ambulatory Status: Independent with steady gait  Past Medical History  Diagnosis Date  . Diabetes (HCC)   . NSTEMI (non-ST elevated myocardial infarction) (HCC)   . LV (left ventricular) mural thrombus (HCC)     History reviewed. No pertinent past surgical history.  Social History   Social History  . Marital Status: Married    Spouse Name: N/A  . Number of Children: N/A  . Years of Education: N/A   Occupational History  . Not on file.   Social History Main Topics  . Smoking status: Never Smoker   . Smokeless tobacco: Not on file  . Alcohol Use: No  . Drug Use: No  . Sexual Activity: Not on file   Other Topics Concern  . Not on file   Social History Narrative  . No narrative on file    No Known Allergies  History reviewed. No pertinent family history.  Prior to Admission medications   Medication Sig Start Date End Date Taking? Authorizing Provider  ibuprofen (ADVIL,MOTRIN) 200 MG tablet Take 400 mg by mouth every 6 (six) hours as needed (for pain.).   Yes Historical Provider, MD    Physical Exam: Filed Vitals:   02/21/16 1330 02/21/16 1421 02/21/16 1430 02/21/16 1500  BP: 122/83 115/71 117/79 134/91  Pulse: 75  79 88  Temp:  99 F (37.2 C)    TempSrc:  Oral    Resp: Height:      Weight:      SpO2: 97% 98% 98% 100%     General:  Appears calm and comfortable Eyes:  PERRL, EOMI, normal lids, iris ENT:  grossly normal hearing, lips & tongue, mmm Neck:  no LAD, masses or thyromegaly Cardiovascular:  RRR, no m/r/g. No LE edema. Right foot with very faint pulses. Left pulses palpable and not as cool as the right foot. A lower extremity without erythema or swelling. Nontender to palpation Respiratory:  CTA bilaterally, no w/r/r.  Normal respiratory effort. Abdomen:  soft, ntnd, positive bowel sounds no guarding or rebounding Skin:  no rash or induration seen on limited exam Musculoskeletal:  grossly normal tone BUE/BLE, good ROM, no bony abnormality Psychiatric:  grossly normal mood and affect, speech fluent and appropriate, AOx3 Neurologic:  CN 2-12 grossly intact, moves all extremities in coordinated fashion, sensation intact  Labs on Admission: I have personally reviewed following labs and imaging studies  CBC:  Recent Labs Lab 02/21/16 0827  WBC 10.1  NEUTROABS 7.9*  HGB 15.5  HCT 42.8  MCV 82.6  PLT 250   Basic Metabolic Panel:  Recent Labs Lab 02/21/16 0827  NA 132*  K 4.5  CL 100*  CO2 25  GLUCOSE 216*  BUN 14  CREATININE 0.94  CALCIUM 8.6*   GFR: Estimated Creatinine Clearance: 97.6 mL/min (by C-G formula based on Cr of 0.94). Liver Function Tests: No results for input(s): AST, ALT, ALKPHOS, BILITOT, PROT, ALBUMIN in the last 168 hours. No results for input(s): LIPASE, AMYLASE in the last 168 hours. No results for input(s): AMMONIA in the last 168 hours. Coagulation Profile:  Recent Labs Lab 02/21/16 1342  INR 1.11   Cardiac Enzymes:  Recent Labs Lab 02/21/16 0827  TROPONINI 0.84*   BNP (last 3 results) No results for input(s): PROBNP in the last 8760 hours. HbA1C: No results for input(s): HGBA1C in the last 72 hours. CBG: No results for input(s): GLUCAP in the last 168 hours. Lipid Profile: No results for input(s): CHOL, HDL, LDLCALC, TRIG, CHOLHDL, LDLDIRECT in the last 72 hours. Thyroid Function Tests: No results for input(s): TSH, T4TOTAL, FREET4, T3FREE, THYROIDAB in the last 72 hours. Anemia Panel: No results for input(s): VITAMINB12, FOLATE, FERRITIN, TIBC, IRON, RETICCTPCT in the last 72 hours. Urine analysis: No results found for: COLORURINE, APPEARANCEUR, LABSPEC, PHURINE, GLUCOSEU, HGBUR, BILIRUBINUR, KETONESUR, PROTEINUR, UROBILINOGEN, NITRITE,  LEUKOCYTESUR  Creatinine Clearance: Estimated Creatinine Clearance: 97.6 mL/min (by C-G formula based on Cr of 0.94).  Sepsis Labs: (procalcitonin:4,lacticidven:4) )No results found for this or any previous visit (from the past 240 hour(s)).   Radiological Exams on Admission: Dg Chest 2 View  02/21/2016  CLINICAL DATA:  Left lower extremity numbness and pain. EXAM: CHEST  2 VIEW COMPARISON:  None. FINDINGS: Normal heart size. Low lung volumes. Top-normal heart size. Mediastinal contour is within normal limits. No pneumothorax. No pleural effusion. Lungs appear clear, with no acute consolidative airspace disease and no pulmonary edema. IMPRESSION: Low lung volumes.  No active disease in the chest. Electronically Signed   By: Delbert Phenix M.D.   On: 02/21/2016 11:27   Ct Angio Ao+bifem W &/or Wo Contrast  02/21/2016  CLINICAL DATA:  Right lower extremity pain with diminished pulses and numbness. EXAM: CT ANGIOGRAPHY OF ABDOMINAL AORTA WITH ILIOFEMORAL RUNOFF TECHNIQUE: Multidetector CT imaging of the abdomen, pelvis and  lower extremities was performed using the standard protocol during bolus administration of intravenous contrast. Multiplanar CT image reconstructions and MIPs were obtained to evaluate the vascular anatomy. CONTRAST:  100 mL Isovue 370 IV COMPARISON:  None. FINDINGS: Aorta: Normally patent abdominal aorta without evidence of atherosclerosis, aneurysm or dissection. Visceral arteries are widely patent including the celiac axis, superior mesenteric artery, inferior mesenteric artery and bilateral single renal arteries. No embolic occlusions identified in the abdomen or pelvis. Right Lower Extremity: Widely patent right common, external and internal iliac arteries. The common femoral artery is normally patent up to its bifurcation. At the femoral bifurcation, there is nearly occlusive thrombus identified extending into both proximal profunda femoral and superficial femoral artery  trunks. Contrast flow is seen beyond the thrombus and into normal distal profunda femoral branches. The SFA is open, but demonstrates some anterior mural thrombus. In the absence of significant atherosclerosis, this may represent some adherent thrombus from recent/prior embolic event. Anterior mural thrombus continues into the popliteal artery above the knee. Below the knee, there is only faint opacification of the distal popliteal artery and no visualized opacification of the tibial arteries. This is likely due to slow flow. However, component of thrombus in the tibial arteries cannot be entirely excluded. Left Lower Extremity: Left-sided arterial supply is completely normal including the common femoral artery, profunda femoral artery, superficial femoral artery, popliteal artery and tibial arteries. Or there is some narrowing of the popliteal artery just above the knee joint of approximately 40-50% is associated with a slightly tortuous/ course. No plaque is identified at this level. A component of incidental popliteal entrapment cannot be excluded. Review of the MIP images confirms the above findings. Lower chest:  Bibasilar atelectasis.  No pleural effusions. Hepatobiliary: Unremarkable arterial phase imaging of the liver and gallbladder. Pancreas: Unremarkable appearance. Spleen: Normal appearance of spleen. Adrenals/Urinary Tract: Normal adrenal glands and kidneys. No renal infarcts identified. Stomach/Bowel: Normal appearance without evidence to suggest mesenteric ischemia or inflammation. No obstruction. Normal appendix. No free air, free fluid or abscess. Lymphatic: No enlarged lymph nodes identified. Reproductive: Negative. Other: No hernias. Musculoskeletal: Bony structures are normal. IMPRESSION: 1. Embolic thrombus at the bifurcation of the right common femoral artery with thrombus extending into proximal profunda femoral and superficial femoral arteries. Thrombus is nearly occlusive but contrast is  seen to flow around the thrombus into the distal vessels. There is also a component of probable anterior mural thrombus in the SFA and popliteal arteries which may be consistent with additional adherent thrombus. Tibial arteries are not adequately opacified on the right, most likely secondary to slow flow. Distal thrombus cannot be excluded. Vascular Surgical consultation is recommended. 2. Incidental finding of a mildly tortuous course of the popliteal artery on the left associated with some narrowing just above the knee joint. This narrowing does not appear atherosclerotic and there may be an incidental component of popliteal entrapment syndrome on the left. These results were called by telephone at the time of interpretation on 02/21/2016 at 2:55 pm to Dr. Zadie Rhine , who verbally acknowledged these results. Electronically Signed   By: Irish Lack M.D.   On: 02/21/2016 14:56    EKG: Independently reviewed. Sinus rhythm Probable inferior infarct, old Anterior infarct, recent Prolonged QT interval biphasic t waves Abnormal ekg   Assessment/Plan Principal Problem:   LV (left ventricular) mural thrombus (HCC) Active Problems:   Anterior subendocardial MI (HCC)   Right leg pain   Non-STEMI (non-ST elevated myocardial infarction) (HCC)   Elevated  troponin   Abnormal EKG   Diabetes (HCC)   #1. Ventricular mural thrombus. CT angio pelvis reveals embolic thrombus at right common femoral artery. Presumably from non-STEMI several weeks ago. Heparin started in the emergency department at [facility]. He was evaluated by vascular surgery who recommends surgery and plan to take today -Admit to telemetry -Vascular surgery per Dr. Claudie Fisherman -Management per Dr. Claudie Fisherman  #2. Non-STEMI/ elevated troponin/abnormal EKG. History indicates chest pain 3 weeks ago. Troponin elevated. EKG abnormal. Pain-free on admission. He denies any history of CAD or PVD. Evaluated by cardiology time subacute anterior MI large by  ECG per cards note. Echo reveals LVEF of 30-35%. -Management per cards -Low-dose ACE inhibitor per cards -Cardiology note indicates patient will eventually need right and left heart cath.  #3. Diabetes. Patient reports being diagnosed with type 2 diabetes but he "watches what he eats". Denies ever being started on any medications. Serum glucose 216 on admission -We'll obtain a hemoglobin A1c -We will use sliding scale insulin for optimal control -May benefit from outpatient follow-up  -#4. Right leg pain. Related to #1. Also has a history of right knee injury as well -surgery as noted above -monitor     DVT prophylaxis: heparin gtt Code Status: full  Family Communication: sons at bedside  Disposition Plan:  Home when ready Consults called: nishan cardiology chen vascular  Admission status: inpatient    Gwenyth Bender MD [facility]  If 7PM-7AM, please contact night-coverage [email or web address] Password [email or web address]  02/21/2016, 3:21 PM

## 2016-02-21 NOTE — ED Notes (Signed)
PA at bedside.

## 2016-02-21 NOTE — ED Provider Notes (Signed)
CSN: [id number]     Arrival date & time 02/21/16  0256 History   First MD Initiated Contact with Patient 02/21/16 [phone]     Chief Complaint  Patient presents with  . Leg Pain     (Consider location/radiation/quality/duration/timing/severity/associated sxs/prior Treatment) HPI   Patient presents with extreme pain in his right leg with associated numbness in his toes that began yesterday.  Has had chronic problems with this leg since he was a child, when the leg "turned to the opposite direction" and he injured it in a similar way in 2014.  Yesterday he was pushing something heavy in a closet with the pain began.  It was continuous for 2 hours, radiating from his right lateral knee down into his foot.  The pain was severe and was associated with numbness/tingling, and cold sensation.  Used 400mg  ibuprofen and topical cream without improvement.  Denies fevers, recent illness, back pain.  Pt states approximately 2 weeks ago he developed left sided chest pressure with N/V that occurred in the setting of fasting and working in the midday sun.  He had pain for 3 days.  When he broke his fast he vomited and felt better.  He has had no chest pain since.  He denies any SOB, DOE.    Currently without insurance as he was told he no longer qualifies for [employer] insurance.  Previously was told he was prediabetic and had high cholesterol.  He attempts to control these things with diet.     History reviewed. No pertinent past medical history. History reviewed. No pertinent past surgical history. No family history on file. Social History  Substance Use Topics  . Smoking status: Never Smoker   . Smokeless tobacco: None  . Alcohol Use: No    Review of Systems  All other systems reviewed and are negative.     Allergies  Review of patient's allergies indicates no known allergies.  Home Medications   Prior to Admission medications   Medication Sig Start Date End Date Taking? Authorizing  Provider  ibuprofen (ADVIL,MOTRIN) 200 MG tablet Take 400 mg by mouth every 6 (six) hours as needed (for pain.).   Yes Historical Provider, MD   BP 151/81 mmHg  Pulse 93  Temp(Src) 98.9 F (37.2 C) (Oral)  Resp 22  SpO2 100% Physical Exam  Constitutional: He appears well-developed and well-nourished. No distress.  HENT:  Head: Normocephalic and atraumatic.  Neck: Neck supple.  Cardiovascular: Normal rate and regular rhythm.   Lower extremity pulses intact.  Right dorsalis pedis pulse slightly decreased compared to left.  Pulmonary/Chest: Effort normal and breath sounds normal. No respiratory distress. He has no wheezes. He has no rales.  Abdominal: Soft. He exhibits no distension and no mass. There is no tenderness. There is no rebound and no guarding.  Musculoskeletal: Normal range of motion. He exhibits no edema or tenderness.  Spine nontender, no crepitus, or stepoffs.  RLE:  No tenderness, no edema, erythema, warmth.  Few erythematous and scabbed lesions scattered over lower leg.  See cardiovascular exam.   Neurological: He is alert. He exhibits normal muscle tone.  Skin: He is not diaphoretic.  Nursing note and vitals reviewed.   ED Course  Procedures (including critical care time) Labs Review Labs Reviewed  BASIC METABOLIC PANEL - Abnormal; Notable for the following:    Sodium 132 (*)    Chloride 100 (*)    Glucose, Bld 216 (*)    Calcium 8.6 (*)  All other components within normal limits  CBC WITH DIFFERENTIAL/PLATELET - Abnormal; Notable for the following:    MCHC 36.2 (*)    Neutro Abs 7.9 (*)    All other components within normal limits  TROPONIN I - Abnormal; Notable for the following:    Troponin I 0.84 (*)    All other components within normal limits  I-STAT TROPOININ, ED - Abnormal; Notable for the following:    Troponin i, poc 0.85 (*)    All other components within normal limits  APTT  PROTIME-INR  HEPARIN LEVEL (UNFRACTIONATED)    Imaging  Review Dg Chest 2 View  02/21/2016  CLINICAL DATA:  Left lower extremity numbness and pain. EXAM: CHEST  2 VIEW COMPARISON:  None. FINDINGS: Normal heart size. Low lung volumes. Top-normal heart size. Mediastinal contour is within normal limits. No pneumothorax. No pleural effusion. Lungs appear clear, with no acute consolidative airspace disease and no pulmonary edema. IMPRESSION: Low lung volumes.  No active disease in the chest. Electronically Signed   By: Delbert Phenix M.D.   On: 02/21/2016 11:27   I have personally reviewed and evaluated these images and lab results as part of my medical decision-making.   EKG Interpretation   Date/Time:  Sunday February 21 2016 09:10:29 EDT Ventricular Rate:  74 PR Interval:    QRS Duration: 113 QT Interval:  462 QTC Calculation: 513 R Axis:   -98 Text Interpretation:  Sinus rhythm Probable inferior infarct, old Anterior  infarct, recent Prolonged QT interval biphasic t waves Abnormal ekg No  previous ECGs available Confirmed by Bebe Shaggy  MD, Dorinda Hill ([id number]) on  02/21/2016 9:21:49 AM Also confirmed by Bebe Shaggy  MD, DONALD ([id number]),  editor Whitney Post, Cala Bradford [phone])  on 02/21/2016 10:05:16 AM       CRITICAL CARE Performed by: Trixie Dredge   Total critical care time: 45 minutes  Critical care time was exclusive of separately billable procedures and treating other patients.  Critical care was necessary to treat or prevent imminent or life-threatening deterioration.  Critical care was time spent personally by me on the following activities: development of treatment plan with patient and/or surrogate as well as nursing, discussions with consultants, evaluation of patient's response to treatment, examination of patient, obtaining history from patient or surrogate, ordering and performing treatments and interventions, ordering and review of laboratory studies, ordering and review of radiographic studies, pulse oximetry and re-evaluation of patient's condition.      Discussed pt with Dr Bebe Shaggy, who has also seen and examined the patient.    9:51 AM I spoke with Dr Delton See, cardiology, who will see the patient.    12:41 PM I spoke with PA Janee Morn, cardiology.  Dr Eden Emms to see pt.  1:13 PM Dr Eden Emms has seen patient.  Pt to be admitted at [facility].    MDM   Final diagnoses:  Pain  Non-STEMI (non-ST elevated myocardial infarction) [facility])  Vascular occlusion  Right leg pain    Pt with untreated HLD and DM p/w severe right leg pain with numbness and cold sensation that resolved after several hours and tx with fentanyl in the waiting room.  On my exam he had present but decreased pulses in the right foot.   He told me of an episode two weeks ago in which he had chest pain, N/V x 3 days, that resolved.  I discussed the patient with Dr Bebe Shaggy.  Out of concern for vascular occlusion or pathology I ordered CT angio of the right lower extremity  and EKG to look for possible arrhythmia.  EKG reviewed by Dr Bebe Shaggy was concerning for [facility].  Troponin then ordered was elevated.  Dr Bebe Shaggy and I discussed the plan at this point and I spoke with cardiology.  We elected to hold off of the CT angio extremity given the fact that patient continued to be pain free had demonstrated pulses in his foot and needed more urgent cardiology evaluation and possible catheterization.  I spoke initially with Dr Delton See of cardiology then again with PA Janee Morn (see above).  Dr Eden Emms saw the patient in the ED and spoke with Dr Bebe Shaggy regarding the resulting plan of discussion with vascular surgery, additional testing, and transfer to [facility].  Please see Dr Bebe Shaggy and Dr Fabio Bering notes for further details.  Dr Bebe Shaggy placed further orders and spoke with Dr Imogene Burn (vascular) who requested ED to ED transfer so he could take the patient to OR.   I spoke with Dr Madilyn Hook (ED [facility]) who accepted patient in transfer.      Trixie Dredge, PA-C 02/21/16 1401  Zadie Rhine, MD 02/21/16 1536

## 2016-02-21 NOTE — ED Notes (Signed)
Unable to collect labs MD and Pa is in the room with the patient

## 2016-02-21 NOTE — ED Notes (Signed)
Patient transported to CT 

## 2016-02-21 NOTE — ED Provider Notes (Signed)
While awaiting evaluation, pt now has gone pulseless in right foot His foot is not cool to touch and is warm to touch He is pain free at this time D/w dr Eden Emms cardiology He has seen patient Plan to admit to hospitalist, start heparin, consult vascular surgery.  He may need cardiac cath but not emergently he suspects he had large MI weeks ago and now has LV thrombus with emboli I spoke to dr Imogene Burn with vascular He requests emergent CT angio abd/pelvis with bifem runoff Start heparin and transfer to ER at [facility] so he can take to OR I have spoken to pharmacy to start heparin   Zadie Rhine, MD 02/21/16 1323

## 2016-02-21 NOTE — ED Notes (Signed)
Pt states that he is having  Rt leg pain from foot to hip; pt denies injury to leg; + pulse, + sensation; no obvious swelling or deformity noted

## 2016-02-21 NOTE — ED Notes (Signed)
PA and EDP informed about elevated troponin.

## 2016-02-21 NOTE — Consult Note (Signed)
CARDIOLOGY CONSULT NOTE       Patient ID: Troy Yu MRN: [id number] DOB/AGE: 1961/04/22 55 y.o.  Admit date: 02/21/2016 Referring Physician:  Bebe Shaggy Primary Physician: No primary care provider on file. Primary Cardiologist:  New Reason for Consultation:  Chest pain  Active Problems:   * No active hospital problems. *   HPI:  55 y.o. seen in ER for right leg pain and paresthesias. Started at 12:30 am.  Pain from knee down. Somewhat improved now but persistent paresthesias. Indicated difficulty moving his ankle Had SSCP for 48 hrs 2 weeks ago. ECG shows subacute anterior MI.  Pain free now. No previous history of CAD, PVD, angina or claudication. Stressed 2 weeks ago when office roof leaked and  Weather was very humid. Has also been fasting for 30 days for religious purposes which just ended today.  No dyspnea or signs of CHF No other evidence of embolic events.  Was taking no meds At home has not been on asa.  Does indicate injuring right knee in past but had acute pain below knee early this am which is different   ROS All other systems reviewed and negative except as noted above  History reviewed. No pertinent past medical history.  No family history on file.  Social History   Social History  . Marital Status: Married    Spouse Name: N/A  . Number of Children: N/A  . Years of Education: N/A   Occupational History  . Not on file.   Social History Main Topics  . Smoking status: Never Smoker   . Smokeless tobacco: Not on file  . Alcohol Use: No  . Drug Use: No  . Sexual Activity: Not on file   Other Topics Concern  . Not on file   Social History Narrative  . No narrative on file    History reviewed. No pertinent past surgical history.      Physical Exam: Blood pressure 117/80, pulse 77, temperature 98.9 F (37.2 C), temperature source Oral, resp. rate 19, SpO2 100 %.   Affect appropriate Middle aged [place] / Muslim male  HEENT: normal Neck supple with  no adenopathy JVP normal no bruits no thyromegaly Lungs clear with no wheezing and good diaphragmatic motion Heart:  S1/S2 no murmur, no rub, gallop or click PMI normal Abdomen: benighn, BS positve, no tenderness, no AAA no bruit.  No HSM or HJR Cannot doppler PT/DP on right foot but it is same temp as left which has loud PT doppler Femorals normal with no bruit.  No edema Neuro non-focal Skin warm and dry No muscular weakness   Labs:   Lab Results  Component Value Date   WBC 10.1 02/21/2016   HGB 15.5 02/21/2016   HCT 42.8 02/21/2016   MCV 82.6 02/21/2016   PLT 250 02/21/2016    Recent Labs Lab 02/21/16 0827  NA 132*  K 4.5  CL 100*  CO2 25  BUN 14  CREATININE 0.94  CALCIUM 8.6*  GLUCOSE 216*   Lab Results  Component Value Date   TROPONINI 0.84* 02/21/2016     Radiology: Dg Chest 2 View  02/21/2016  CLINICAL DATA:  Left lower extremity numbness and pain. EXAM: CHEST  2 VIEW COMPARISON:  None. FINDINGS: Normal heart size. Low lung volumes. Top-normal heart size. Mediastinal contour is within normal limits. No pneumothorax. No pleural effusion. Lungs appear clear, with no acute consolidative airspace disease and no pulmonary edema. IMPRESSION: Low lung volumes.  No active disease in the  chest. Electronically Signed   By: Delbert Phenix M.D.   On: 02/21/2016 11:27    EKG:  SR subacute anterior MI with PVC   ASSESSMENT AND PLAN:  Chest Pain:  Subacute anterior MI large by ECG Clinically occurred 2 weeks ago when he had 48 hrs pain. Have contacted echo tech to do stat echo to assess EF and r/o Mural apical thrombus.  Start low dose ACE and beta blocker pending results. Have empirically started him on asa and heparin  Leg Pain:  Discussed with Dr Bebe Shaggy need for arterial duplex and notifying [facility] he indicated he would work on this. Foot/leg does not appear cold or in jeopardy But acute onset of pain , lack of PT/DP in distal small vessels and normal femoral with no  history of claudication suggests embolic event to leg below knee.   Discussed diagnosis of MI with 2 sons and patient . At some point latter in week will need right and left heart cath. Will be admitted by hospitalist to [facility]  Signed: Charlton Haws 02/21/2016, 1:13 PM

## 2016-02-21 NOTE — Anesthesia Procedure Notes (Signed)
Anesthesia Procedure Note Time out, Sterile prep, drape R wrist. 1% lido local, #22ga AC radial artery.  Biopatch and sterile dressing on.  VSS, pt tolerated well  Sandford Craze, MD  [phone]

## 2016-02-21 NOTE — Progress Notes (Signed)
*  PRELIMINARY RESULTS* Echocardiogram 2D Echocardiogram has been performed.  Troy Yu 02/21/2016, 2:00 PM

## 2016-02-21 NOTE — Transfer of Care (Signed)
Immediate Anesthesia Transfer of Care Note  Patient: Troy Yu  Procedure(s) Performed: Procedure(s): EMBOLECTOMY RIGHT COMMON FEMORAL ARTERY; FOUR COMPARTMENT FASCIOTOMY (Right)  Patient Location: PACU  Anesthesia Type:MAC  Level of Consciousness: awake  Airway & Oxygen Therapy: Patient Spontanous Breathing  Post-op Assessment: Report given to RN and Post -op Vital signs reviewed and stable  Post vital signs: Reviewed and stable  Last Vitals:  Filed Vitals:   02/21/16 1600 02/21/16 2015  BP: 117/79   Pulse: 81 80  Temp:    Resp: 26 20    Last Pain:  Filed Vitals:   02/21/16 2015  PainSc: 10-Worst pain ever         Complications: No apparent anesthesia complications

## 2016-02-21 NOTE — Consult Note (Addendum)
Referred by:  [facility] ED  Reason for referral: right leg ischemia   History of Present Illness  Troy Yu is a 55 y.o. (06-07-1961) male who presents with chief complaint: right leg pain.  This patient reported developed severe right leg pain that woke him up from sleep (~1 am).  He was seen in [facility] ED and found to a subacute MI.  Reported while at [facility] his right leg pain improved.  He initially had paraesthesia associated with the right leg pain.  This also improved.  He denies any current chest pain.  He has no known prior history of thrombopenia and no family history of such.    Past Medical History: none  Past Surgical History: none   Social History   Social History  . Marital Status: Married    Spouse Name: N/A  . Number of Children: N/A  . Years of Education: N/A   Occupational History  . Not on file.   Social History Main Topics  . Smoking status: Never Smoker   . Smokeless tobacco: Not on file  . Alcohol Use: No  . Drug Use: No  . Sexual Activity: Not on file   Other Topics Concern  . Not on file   Social History Narrative  . No narrative on file    Family History: patient is unable to detail the medical history of his parents    Current Facility-Administered Medications  Medication Dose Route Frequency Provider Last Rate Last Dose  . 0.9 %  sodium chloride infusion   Intravenous STAT Nelva Nay, MD      . acetaminophen (TYLENOL) tablet 650 mg  650 mg Oral Q6H PRN Gwenyth Bender, NP       Or  . acetaminophen (TYLENOL) suppository 650 mg  650 mg Rectal Q6H PRN Gwenyth Bender, NP      . Melene Muller ON 02/22/2016] aspirin tablet 325 mg  325 mg Oral Daily Wendall Stade, MD      . heparin ADULT infusion 100 units/mL (25000 units/238mL sodium chloride 0.45%)  14 Units/kg/hr (Adjusted) Intravenous Continuous Rollene Fare, RPH 11 mL/hr at 02/21/16 1341 14 Units/kg/hr at 02/21/16 1341  . insulin aspart (novoLOG) injection 0-15 Units  0-15 Units Subcutaneous TID WC  Lesle Chris Black, NP      . insulin aspart (novoLOG) injection 0-5 Units  0-5 Units Subcutaneous QHS Lesle Chris Black, NP      . ondansetron [facility]) injection 4 mg  4 mg Intravenous Q8H PRN Nelva Nay, MD      . ondansetron [facility]) tablet 4 mg  4 mg Oral Q6H PRN Gwenyth Bender, NP       Or  . ondansetron [facility]) injection 4 mg  4 mg Intravenous Q6H PRN Gwenyth Bender, NP       Current Outpatient Prescriptions  Medication Sig Dispense Refill  . ibuprofen (ADVIL,MOTRIN) 200 MG tablet Take 400 mg by mouth every 6 (six) hours as needed (for pain.).       No Known Allergies   REVIEW OF SYSTEMS:  (Positives checked otherwise negative)  CARDIOVASCULAR:    chest pain: 2 week ago  chest pressure,   palpitations,   shortness of breath when laying flat,   shortness of breath with exertion,    pain in feet when walking,   pain in feet when laying flat,  history of blood clot in veins (DVT),   history of phlebitis,    swelling in legs,  [ ]  varicose veins  PULMONARY:   [ ]  productive cough,  [ ]  asthma,  [ ]  wheezing  NEUROLOGIC:   [ ]  weakness in arms or legs,  [ ]  numbness in arms or legs,  [ ]  difficulty speaking or slurred speech,  [ ]  temporary loss of vision in one eye,  [ ]  dizziness  HEMATOLOGIC:   [ ]  bleeding problems,  [ ]  problems with blood clotting too easily  MUSCULOSKEL:   [ ]  joint pain, [ ]  joint swelling  GASTROINTEST:   [ ]  vomiting blood,  [ ]  blood in stool     GENITOURINARY:   [ ]  burning with urination,  [ ]  blood in urine  PSYCHIATRIC:   [ ]  history of major depression  INTEGUMENTARY:   [ ]  rashes,  [ ]  ulcers  CONSTITUTIONAL:   [ ]  fever,  [ ]  chills   Physical Examination  Filed Vitals:   02/21/16 1330 02/21/16 1421 02/21/16 1430 02/21/16 1500  BP: 122/83 115/71 117/79 134/91  Pulse: 75  79 88  Temp:  99 F (37.2 C)    TempSrc:  Oral    Resp: 18 16 19 24   Height:      Weight:      SpO2: 97% 98%  98% 100%   Body mass index is 31.48 kg/(m^2).  General: A&O x 3, WDWN  Head: Asher/AT  Ear/Nose/Throat: Hearing grossly intact, nares w/o erythema or drainage, oropharynx w/o Erythema/Exudate, Mallampati score: 3  Eyes: PERRLA, EOMI  Neck: Supple, no nuchal rigidity, no palpable LAD  Pulmonary: Sym exp, good air movt, CTAB, no rales, rhonchi, & wheezing  Cardiac: RRR, Nl S1, S2, no Murmurs, rubs or gallops  Vascular: Vessel Right Left  Radial Palpable Palpable  Brachial Palpable Palpable  Carotid Palpable, without bruit Palpable, without bruit  Aorta Not palpable N/A  Femoral Faintly Palpable Palpable  Popliteal Not palpable Not palpable  PT Not Palpable Palpable  DP Not Palpable Palpable   Gastrointestinal: soft, NTND, no G/R, no HSM, no masses, no CVAT B  Musculoskeletal: M/S 5/5 throughout , intact PF/DF in R foot, Extremities without ischemic changes except cool from distal lower leg  Neurologic: CN 2-12 intact , Pain and light touch intact in extremities including right foot, Motor exam as listed above  Psychiatric: Judgment intact, Mood & affect appropriate for pt's clinical situation  Dermatologic: See M/S exam for extremity exam, no rashes otherwise noted  Lymph : No Cervical, Axillary, or Inguinal lymphadenopathy    Laboratory: CBC:    Component Value Date/Time   WBC 10.1 02/21/2016 0827   RBC 5.18 02/21/2016 0827   HGB 15.5 02/21/2016 0827   HCT 42.8 02/21/2016 0827   PLT 250 02/21/2016 0827   MCV 82.6 02/21/2016 0827   MCH 29.9 02/21/2016 0827   MCHC 36.2* 02/21/2016 0827   RDW 12.0 02/21/2016 0827   LYMPHSABS 1.7 02/21/2016 0827   MONOABS 0.4 02/21/2016 0827   EOSABS 0.0 02/21/2016 0827   BASOSABS 0.0 02/21/2016 0827    BMP:    Component Value Date/Time   NA 132* 02/21/2016 0827   K 4.5 02/21/2016 0827   CL 100* 02/21/2016 0827   CO2 25 02/21/2016 0827   GLUCOSE 216* 02/21/2016 0827   BUN 14 02/21/2016 0827   CREATININE 0.94 02/21/2016  0827   CALCIUM 8.6* 02/21/2016 0827   GFRNONAA >60 02/21/2016 0827   GFRAA >60 02/21/2016 0827    Coagulation: Lab Results  Component Value Date   INR 1.11 02/21/2016   No results found for: PTT  Lipids: No results found for: CHOL, TRIG, HDL, CHOLHDL, VLDL, LDLCALC, LDLDIRECT   Radiology: Dg Chest 2 View  02/21/2016  CLINICAL DATA:  Left lower extremity numbness and pain. EXAM: CHEST  2 VIEW COMPARISON:  None. FINDINGS: Normal heart size. Low lung volumes. Top-normal heart size. Mediastinal contour is within normal limits. No pneumothorax. No pleural effusion. Lungs appear clear, with no acute consolidative airspace disease and no pulmonary edema. IMPRESSION: Low lung volumes.  No active disease in the chest. Electronically Signed   By: Delbert Phenix M.D.   On: 02/21/2016 11:27   Ct Angio Ao+bifem W &/or Wo Contrast  02/21/2016  CLINICAL DATA:  Right lower extremity pain with diminished pulses and numbness. EXAM: CT ANGIOGRAPHY OF ABDOMINAL AORTA WITH ILIOFEMORAL RUNOFF TECHNIQUE: Multidetector CT imaging of the abdomen, pelvis and lower extremities was performed using the standard protocol during bolus administration of intravenous contrast. Multiplanar CT image reconstructions and MIPs were obtained to evaluate the vascular anatomy. CONTRAST:  100 mL Isovue 370 IV COMPARISON:  None. FINDINGS: Aorta: Normally patent abdominal aorta without evidence of atherosclerosis, aneurysm or dissection. Visceral arteries are widely patent including the celiac axis, superior mesenteric artery, inferior mesenteric artery and bilateral single renal arteries. No embolic occlusions identified in the abdomen or pelvis. Right Lower Extremity: Widely patent right common, external and internal iliac arteries. The common femoral artery is normally patent up to its bifurcation. At the femoral bifurcation, there is nearly occlusive thrombus identified extending into both proximal profunda femoral and superficial  femoral artery trunks. Contrast flow is seen beyond the thrombus and into normal distal profunda femoral branches. The SFA is open, but demonstrates some anterior mural thrombus. In the absence of significant atherosclerosis, this may represent some adherent thrombus from recent/prior embolic event. Anterior mural thrombus continues into the popliteal artery above the knee. Below the knee, there is only faint opacification of the distal popliteal artery and no visualized opacification of the tibial arteries. This is likely due to slow flow. However, component of thrombus in the tibial arteries cannot be entirely excluded. Left Lower Extremity: Left-sided arterial supply is completely normal including the common femoral artery, profunda femoral artery, superficial femoral artery, popliteal artery and tibial arteries. Or there is some narrowing of the popliteal artery just above the knee joint of approximately 40-50% is associated with a slightly tortuous/ course. No plaque is identified at this level. A component of incidental popliteal entrapment cannot be excluded. Review of the MIP images confirms the above findings. Lower chest:  Bibasilar atelectasis.  No pleural effusions. Hepatobiliary: Unremarkable arterial phase imaging of the liver and gallbladder. Pancreas: Unremarkable appearance. Spleen: Normal appearance of spleen. Adrenals/Urinary Tract: Normal adrenal glands and kidneys. No renal infarcts identified. Stomach/Bowel: Normal appearance without evidence to suggest mesenteric ischemia or inflammation. No obstruction. Normal appendix. No free air, free fluid or abscess. Lymphatic: No enlarged lymph nodes identified. Reproductive: Negative. Other: No hernias. Musculoskeletal: Bony structures are normal. IMPRESSION: 1. Embolic thrombus at the bifurcation of the right common femoral artery with thrombus extending into proximal profunda femoral and superficial femoral arteries. Thrombus is nearly occlusive but  contrast is seen to flow around the thrombus into the distal vessels. There is also a component of probable anterior mural thrombus in the SFA and popliteal arteries which may be consistent with additional adherent thrombus. Tibial arteries are not adequately opacified on the right, most likely secondary to  slow flow. Distal thrombus cannot be excluded. Vascular Surgical consultation is recommended. 2. Incidental finding of a mildly tortuous course of the popliteal artery on the left associated with some narrowing just above the knee joint. This narrowing does not appear atherosclerotic and there may be an incidental component of popliteal entrapment syndrome on the left. These results were called by telephone at the time of interpretation on 02/21/2016 at 2:55 pm to Dr. Zadie Rhine , who verbally acknowledged these results. Electronically Signed   By: Irish Lack M.D.   On: 02/21/2016 14:56   Based on this patient's CTA, he has an embolism to R distal CFA extending into profunda femoral artery and superficial femoral artery.  There is flow past the R SFA thrombus.  Distal R pop flow is poorly visualized and distal tibial flow is not visualized.  Medical Decision Making  Jayveon Convey is a 55 y.o. male who presents with: recent MI, R femoral embolism likely to result in threatened right leg    Pt will need: Emergent R leg embolectomy, possible four-compartment fasciotomy. The risk, benefits, and alternative for this operation were discussed with the patient.   The patient is aware the risks include but are not limited to: bleeding, infection, myocardial infarction, stroke, limb loss, nerve damage, need for additional procedures in the future, and wound complications. Obviously this patient is at increased risk of mortality given the recent MI, unfortunately his history is consistent with acute femoral occlusion which resolved likely due to partial distal embolization.  Waiting is likely to result in  limb loss in this patient. He is aware of the risk and has agreed to proceed.  Thank you for allowing [place] to participate in this patient's care.   Leonides Sake, MD [facility] Office: [phone] Pager: [phone]  02/21/2016, 3:12 PM

## 2016-02-21 NOTE — ED Notes (Signed)
Echo at bedside

## 2016-02-22 ENCOUNTER — Encounter (HOSPITAL_COMMUNITY): Payer: Self-pay | Admitting: Certified Registered Nurse Anesthetist

## 2016-02-22 ENCOUNTER — Inpatient Hospital Stay (HOSPITAL_COMMUNITY): Payer: MEDICAID | Admitting: Certified Registered Nurse Anesthetist

## 2016-02-22 ENCOUNTER — Encounter (HOSPITAL_COMMUNITY)
Admission: EM | Disposition: A | Discharge status: Home Health | Payer: Self-pay | Source: Home / Self Care | Attending: Internal Medicine

## 2016-02-22 ENCOUNTER — Inpatient Hospital Stay (HOSPITAL_COMMUNITY): Payer: Self-pay | Admitting: Certified Registered Nurse Anesthetist

## 2016-02-22 ENCOUNTER — Other Ambulatory Visit: Payer: Self-pay

## 2016-02-22 DIAGNOSIS — R9431 Abnormal electrocardiogram [ECG] [EKG]: Secondary | ICD-10-CM

## 2016-02-22 DIAGNOSIS — I213 ST elevation (STEMI) myocardial infarction of unspecified site: Secondary | ICD-10-CM

## 2016-02-22 DIAGNOSIS — I97638 Postprocedural hematoma of a circulatory system organ or structure following other circulatory system procedure: Secondary | ICD-10-CM

## 2016-02-22 DIAGNOSIS — R7989 Other specified abnormal findings of blood chemistry: Secondary | ICD-10-CM

## 2016-02-22 DIAGNOSIS — R52 Pain, unspecified: Secondary | ICD-10-CM

## 2016-02-22 DIAGNOSIS — E1169 Type 2 diabetes mellitus with other specified complication: Secondary | ICD-10-CM

## 2016-02-22 HISTORY — PX: APPLICATION OF WOUND VAC: SHX5189

## 2016-02-22 HISTORY — PX: HEMATOMA EVACUATION: SHX5118

## 2016-02-22 LAB — BASIC METABOLIC PANEL
Anion gap: 6 (ref 5–15)
BUN: 12 mg/dL (ref 6–20)
CALCIUM: 7.9 mg/dL — AB (ref 8.9–10.3)
CO2: 23 mmol/L (ref 22–32)
CREATININE: 1.01 mg/dL (ref 0.61–1.24)
Chloride: 101 mmol/L (ref 101–111)
GFR calc Af Amer: >60 mL/min (ref >60)
GLUCOSE: 303 mg/dL — AB (ref 65–99)
Potassium: 4.4 mmol/L (ref 3.5–5.1)
SODIUM: 130 mmol/L — AB (ref 135–145)

## 2016-02-22 LAB — CBC
HEMATOCRIT: 37.9 % — AB (ref 39.0–52.0)
HEMOGLOBIN: 12.8 g/dL — AB (ref 13.0–17.0)
MCH: 28.5 pg (ref 26.0–34.0)
MCHC: 33.8 g/dL (ref 30.0–36.0)
MCV: 84.4 fL (ref 78.0–100.0)
Platelets: 266 10*3/uL (ref 150–400)
RBC: 4.49 MIL/uL (ref 4.22–5.81)
RDW: 12.1 % (ref 11.5–15.5)
WBC: 12.6 10*3/uL — ABNORMAL HIGH (ref 4.0–10.5)

## 2016-02-22 LAB — GLUCOSE, CAPILLARY
GLUCOSE-CAPILLARY: 126 mg/dL — AB (ref 65–99)
Glucose-Capillary: 309 mg/dL — ABNORMAL HIGH (ref 65–99)
Glucose-Capillary: 230 mg/dL — ABNORMAL HIGH (ref 65–99)
Glucose-Capillary: 105 mg/dL — ABNORMAL HIGH (ref 65–99)

## 2016-02-22 LAB — TROPONIN I: Troponin I: 0.85 ng/mL (ref <0.031)

## 2016-02-22 LAB — HEPARIN LEVEL (UNFRACTIONATED): Heparin Unfractionated: 0.57 IU/mL (ref 0.30–0.70)

## 2016-02-22 SURGERY — EVACUATION HEMATOMA
Anesthesia: General | Site: Leg Lower | Laterality: Right

## 2016-02-22 MED ORDER — ROCURONIUM BROMIDE 50 MG/5ML IV SOLN
INTRAVENOUS | Status: AC
  Filled 2016-02-22: qty 1

## 2016-02-22 MED ORDER — LACTATED RINGERS IV SOLN
INTRAVENOUS | Status: DC
  Administered 2016-02-22: 08:00:00 via INTRAVENOUS

## 2016-02-22 MED ORDER — FENTANYL CITRATE (PF) 250 MCG/5ML IJ SOLN
INTRAMUSCULAR | Status: AC
  Filled 2016-02-22: qty 5

## 2016-02-22 MED ORDER — FENTANYL CITRATE (PF) 100 MCG/2ML IJ SOLN
INTRAMUSCULAR | Status: DC | PRN
  Administered 2016-02-22 (×3): 50 ug via INTRAVENOUS

## 2016-02-22 MED ORDER — SUCCINYLCHOLINE CHLORIDE 20 MG/ML IJ SOLN
INTRAMUSCULAR | Status: DC | PRN
  Administered 2016-02-22: 100 mg via INTRAVENOUS

## 2016-02-22 MED ORDER — HEPARIN (PORCINE) IN NACL 100-0.45 UNIT/ML-% IJ SOLN
1350.0000 [IU]/h | INTRAMUSCULAR | Status: DC
  Administered 2016-02-22: 900 [IU]/h via INTRAVENOUS
  Administered 2016-02-23: 1200 [IU]/h via INTRAVENOUS
  Administered 2016-02-24 – 2016-02-26 (×3): 1350 [IU]/h via INTRAVENOUS
  Filled 2016-02-22 (×4): qty 250

## 2016-02-22 MED ORDER — PROPOFOL 10 MG/ML IV BOLUS
INTRAVENOUS | Status: DC | PRN
  Administered 2016-02-22: 100 mg via INTRAVENOUS

## 2016-02-22 MED ORDER — PHENYLEPHRINE HCL 10 MG/ML IJ SOLN
INTRAMUSCULAR | Status: DC | PRN
  Administered 2016-02-22: 80 ug via INTRAVENOUS

## 2016-02-22 MED ORDER — INSULIN ASPART 100 UNIT/ML ~~LOC~~ SOLN
4.0000 [IU] | Freq: Three times a day (TID) | SUBCUTANEOUS | Status: DC
  Administered 2016-02-23 – 2016-02-25 (×9): 4 [IU] via SUBCUTANEOUS

## 2016-02-22 MED ORDER — SODIUM CHLORIDE 0.9 % IV SOLN
INTRAVENOUS | Status: DC | PRN
  Administered 2016-02-22: 09:00:00

## 2016-02-22 MED ORDER — EPHEDRINE SULFATE 50 MG/ML IJ SOLN
INTRAMUSCULAR | Status: DC | PRN
  Administered 2016-02-22 (×3): 5 mg via INTRAVENOUS
  Administered 2016-02-22: 10 mg via INTRAVENOUS
  Administered 2016-02-22: 5 mg via INTRAVENOUS

## 2016-02-22 MED ORDER — MEPERIDINE HCL 25 MG/ML IJ SOLN: 6.2500 mg | INTRAMUSCULAR | Status: DC | PRN

## 2016-02-22 MED ORDER — PROPRANOLOL HCL 10 MG PO TABS
10.0000 mg | ORAL_TABLET | Freq: Three times a day (TID) | ORAL | Status: DC
  Administered 2016-02-22 (×2): 10 mg via ORAL
  Filled 2016-02-22 (×3): qty 1

## 2016-02-22 MED ORDER — PHENYLEPHRINE HCL 10 MG/ML IJ SOLN
10.0000 mg | INTRAVENOUS | Status: DC | PRN
  Administered 2016-02-22: 25 ug/min via INTRAVENOUS

## 2016-02-22 MED ORDER — SUCCINYLCHOLINE CHLORIDE 200 MG/10ML IV SOSY
PREFILLED_SYRINGE | INTRAVENOUS | Status: AC
  Filled 2016-02-22: qty 20

## 2016-02-22 MED ORDER — PROPOFOL 10 MG/ML IV BOLUS
INTRAVENOUS | Status: AC
  Filled 2016-02-22: qty 20

## 2016-02-22 MED ORDER — PROMETHAZINE HCL 25 MG/ML IJ SOLN: 6.2500 mg | INTRAMUSCULAR | Status: DC | PRN

## 2016-02-22 MED ORDER — HYDROMORPHONE HCL 1 MG/ML IJ SOLN: 0.2500 mg | INTRAMUSCULAR | Status: DC | PRN

## 2016-02-22 MED ORDER — INSULIN ASPART 100 UNIT/ML ~~LOC~~ SOLN
0.0000 [IU] | Freq: Every day | SUBCUTANEOUS | Status: DC
  Administered 2016-02-25: 2 [IU] via SUBCUTANEOUS
  Administered 2016-02-26: 3 [IU] via SUBCUTANEOUS
  Administered 2016-03-02: 2 [IU] via SUBCUTANEOUS

## 2016-02-22 MED ORDER — PHENYLEPHRINE 40 MCG/ML (10ML) SYRINGE FOR IV PUSH (FOR BLOOD PRESSURE SUPPORT)
PREFILLED_SYRINGE | INTRAVENOUS | Status: AC
  Filled 2016-02-22: qty 20

## 2016-02-22 MED ORDER — INSULIN ASPART 100 UNIT/ML ~~LOC~~ SOLN
0.0000 [IU] | Freq: Three times a day (TID) | SUBCUTANEOUS | Status: DC
  Administered 2016-02-23 – 2016-02-24 (×4): 5 [IU] via SUBCUTANEOUS
  Administered 2016-02-24 (×2): 3 [IU] via SUBCUTANEOUS
  Administered 2016-02-25 – 2016-02-26 (×5): 5 [IU] via SUBCUTANEOUS
  Administered 2016-02-26: 3 [IU] via SUBCUTANEOUS
  Administered 2016-02-27: 5 [IU] via SUBCUTANEOUS
  Administered 2016-02-27 (×2): 8 [IU] via SUBCUTANEOUS
  Administered 2016-02-28: 5 [IU] via SUBCUTANEOUS
  Administered 2016-02-28: 2 [IU] via SUBCUTANEOUS
  Administered 2016-02-28 – 2016-02-29 (×3): 3 [IU] via SUBCUTANEOUS
  Administered 2016-02-29: 5 [IU] via SUBCUTANEOUS
  Administered 2016-03-01 – 2016-03-02 (×4): 3 [IU] via SUBCUTANEOUS
  Administered 2016-03-02: 5 [IU] via SUBCUTANEOUS
  Administered 2016-03-03: 2 [IU] via SUBCUTANEOUS
  Administered 2016-03-03 (×2): 3 [IU] via SUBCUTANEOUS
  Administered 2016-03-04 – 2016-03-05 (×3): 2 [IU] via SUBCUTANEOUS
  Administered 2016-03-05: 3 [IU] via SUBCUTANEOUS
  Administered 2016-03-05: 5 [IU] via SUBCUTANEOUS
  Administered 2016-03-06 – 2016-03-08 (×3): 2 [IU] via SUBCUTANEOUS
  Administered 2016-03-09: 3 [IU] via SUBCUTANEOUS
  Administered 2016-03-11: 2 [IU] via SUBCUTANEOUS
  Administered 2016-03-11: 3 [IU] via SUBCUTANEOUS
  Administered 2016-03-12 (×2): 2 [IU] via SUBCUTANEOUS
  Administered 2016-03-13 – 2016-03-14 (×3): 3 [IU] via SUBCUTANEOUS
  Administered 2016-03-15: 2 [IU] via SUBCUTANEOUS
  Administered 2016-03-15: 3 [IU] via SUBCUTANEOUS
  Administered 2016-03-15: 2 [IU] via SUBCUTANEOUS

## 2016-02-22 MED ORDER — LIDOCAINE 2% (20 MG/ML) 5 ML SYRINGE
INTRAMUSCULAR | Status: AC
  Filled 2016-02-22: qty 10

## 2016-02-22 MED ORDER — INSULIN GLARGINE 100 UNIT/ML ~~LOC~~ SOLN
10.0000 [IU] | Freq: Every day | SUBCUTANEOUS | Status: DC
  Administered 2016-02-22 – 2016-02-24 (×3): 10 [IU] via SUBCUTANEOUS
  Filled 2016-02-22 (×3): qty 0.1

## 2016-02-22 MED ORDER — EPHEDRINE 5 MG/ML INJ
INTRAVENOUS | Status: AC
  Filled 2016-02-22: qty 10

## 2016-02-22 MED ORDER — SODIUM CHLORIDE 0.9 % IR SOLN
Status: DC | PRN
Start: 2016-02-22 — End: 2016-02-22
  Administered 2016-02-22: 1000 mL
  Administered 2016-02-22: 3000 mL

## 2016-02-22 MED ORDER — LIDOCAINE HCL (CARDIAC) 20 MG/ML IV SOLN
INTRAVENOUS | Status: DC | PRN
  Administered 2016-02-22: 100 mg via INTRAVENOUS

## 2016-02-22 SURGICAL SUPPLY — 48 items
BANDAGE ELASTIC 4 VELCRO ST LF (GAUZE/BANDAGES/DRESSINGS) IMPLANT
BANDAGE ESMARK 6X9 LF (GAUZE/BANDAGES/DRESSINGS) IMPLANT
BNDG ESMARK 6X9 LF (GAUZE/BANDAGES/DRESSINGS)
CANISTER SUCTION 2500CC (MISCELLANEOUS) ×6 IMPLANT
CONNECTOR Y ATS VAC SYSTEM (MISCELLANEOUS) ×3 IMPLANT
CUFF TOURNIQUET SINGLE 18IN (TOURNIQUET CUFF) IMPLANT
CUFF TOURNIQUET SINGLE 24IN (TOURNIQUET CUFF) IMPLANT
CUFF TOURNIQUET SINGLE 34IN LL (TOURNIQUET CUFF) IMPLANT
CUFF TOURNIQUET SINGLE 44IN (TOURNIQUET CUFF) IMPLANT
DRAIN CHANNEL 15F RND FF W/TCR (WOUND CARE) IMPLANT
DRAPE ORTHO SPLIT 77X108 STRL (DRAPES) ×2
DRAPE PROXIMA HALF (DRAPES) ×3 IMPLANT
DRAPE SURG ORHT 6 SPLT 77X108 (DRAPES) ×1 IMPLANT
DRAPE U-SHAPE 47X51 STRL (DRAPES) ×3 IMPLANT
DRSG COVADERM 4X10 (GAUZE/BANDAGES/DRESSINGS) IMPLANT
DRSG COVADERM 4X8 (GAUZE/BANDAGES/DRESSINGS) IMPLANT
DRSG VAC ATS LRG SENSATRAC (GAUZE/BANDAGES/DRESSINGS) ×3 IMPLANT
ELECT REM PT RETURN 9FT ADLT (ELECTROSURGICAL) ×3
ELECTRODE REM PT RTRN 9FT ADLT (ELECTROSURGICAL) ×1 IMPLANT
EVACUATOR SILICONE 100CC (DRAIN) IMPLANT
GLOVE BIO SURGEON STRL SZ7 (GLOVE) ×3 IMPLANT
GLOVE BIOGEL PI IND STRL 7.5 (GLOVE) ×1 IMPLANT
GLOVE BIOGEL PI INDICATOR 7.5 (GLOVE) ×2
GOWN STRL REUS W/ TWL LRG LVL3 (GOWN DISPOSABLE) ×3 IMPLANT
GOWN STRL REUS W/TWL LRG LVL3 (GOWN DISPOSABLE) ×6
HANDPIECE INTERPULSE COAX TIP (DISPOSABLE) ×2
KIT BASIN OR (CUSTOM PROCEDURE TRAY) ×3 IMPLANT
KIT REMOVER STAPLE SKIN (MISCELLANEOUS) ×3 IMPLANT
KIT ROOM TURNOVER OR (KITS) ×3 IMPLANT
NS IRRIG 1000ML POUR BTL (IV SOLUTION) ×6 IMPLANT
PACK PERIPHERAL VASCULAR (CUSTOM PROCEDURE TRAY) ×3 IMPLANT
PAD ARMBOARD 7.5X6 YLW CONV (MISCELLANEOUS) ×6 IMPLANT
PADDING CAST COTTON 6X4 STRL (CAST SUPPLIES) IMPLANT
SET HNDPC FAN SPRY TIP SCT (DISPOSABLE) ×1 IMPLANT
SPONGE LAP 18X18 X RAY DECT (DISPOSABLE) ×3 IMPLANT
SPONGE SURGIFOAM ABS GEL 100 (HEMOSTASIS) IMPLANT
STAPLER VISISTAT 35W (STAPLE) IMPLANT
SUT MNCRL AB 4-0 PS2 18 (SUTURE) IMPLANT
SUT PROLENE 5 0 C 1 24 (SUTURE) IMPLANT
SUT PROLENE 6 0 BV (SUTURE) IMPLANT
SUT VIC AB 2-0 CT1 27 (SUTURE)
SUT VIC AB 2-0 CT1 TAPERPNT 27 (SUTURE) IMPLANT
SUT VIC AB 3-0 SH 27 (SUTURE) ×2
SUT VIC AB 3-0 SH 27X BRD (SUTURE) ×1 IMPLANT
SYR 20CC LL (SYRINGE) ×3 IMPLANT
TRAY FOLEY W/METER SILVER 16FR (SET/KITS/TRAYS/PACK) ×3 IMPLANT
UNDERPAD 30X30 INCONTINENT (UNDERPADS AND DIAPERS) ×3 IMPLANT
WATER STERILE IRR 1000ML POUR (IV SOLUTION) ×3 IMPLANT

## 2016-02-22 NOTE — Progress Notes (Signed)
ANTICOAGULATION CONSULT NOTE - Follow Up Consult  Pharmacy Consult for Heparin  Indication: Subacute MI/vascular occlusion  No Known Allergies  Patient Measurements: Height: 5\' 8"  (172.7 cm) Weight: 204 lb 5.9 oz (92.7 kg) IBW/kg (Calculated) : 68.4 Vital Signs: Temp: 97.9 F (36.6 C) (06/26 1315) Temp Source: Oral (06/26 0743) BP: 115/81 mmHg (06/26 1315) Pulse Rate: 96 (06/26 1315)  Labs:  Recent Labs  02/21/16 0827 02/21/16 1342 02/21/16 2026 02/21/16 2300 02/22/16 0400 02/22/16 0500  HGB 15.5  --   --  14.2  --  12.8*  HCT 42.8  --   --  41.3  --  37.9*  PLT 250  --   --  266  --  266  APTT  --  25  --   --   --   --   LABPROT  --  14.5  --   --   --   --   INR  --  1.11  --   --   --   --   HEPARINUNFRC  --   --   --   --  0.57  --   CREATININE 0.94  --   --   --  1.01  --   TROPONINI 0.84*  --  0.94*  --  0.85*  --     Estimated Creatinine Clearance: 90.2 mL/min (by C-G formula based on Cr of 1.01).   Assessment: 55 yo male with LV thrombus and R femoral embolism s/p embolectomy on 6/25 and now s/p hematoma evacuation. Heparin to restart 8 hrs post surgery. -Heparin infusion was last at 1100 units/hr with heparin level= 0.57  Goal of Therapy:  Heparin level= 0.,5-0.7 Monitor platelets by anticoagulation protocol: Yes   Plan:  -No heparin bolus -restart heparin at 7pm at 900/hr (goal 0.3-0.5).  -heparin level and CBC daily  Harland German, Pharm D 02/22/2016 1:55 PM

## 2016-02-22 NOTE — Transfer of Care (Signed)
Immediate Anesthesia Transfer of Care Note  Patient: Troy Yu  Procedure(s) Performed: Procedure(s): EVACUATION HEMATOMA With Placement of Negative Pressure Dressing (Right) APPLICATION OF Negative Pressure Dressing Right Lower Leg (Right)  Patient Location: PACU  Anesthesia Type:General  Level of Consciousness: awake, alert , oriented and patient cooperative  Airway & Oxygen Therapy: Patient Spontanous Breathing and Patient connected to face mask oxygen  Post-op Assessment: Report given to RN and Post -op Vital signs reviewed and stable  Post vital signs: Reviewed and stable  Last Vitals:  Filed Vitals:   02/22/16 0743 02/22/16 1030  BP: 108/85 117/78  Pulse: 98 100  Temp: 36.7 C   Resp: 12     Last Pain:  Filed Vitals:   02/22/16 1034  PainSc: 6       Patients Stated Pain Goal: 0 (02/22/16 0450)  Complications: No apparent anesthesia complications

## 2016-02-22 NOTE — Anesthesia Procedure Notes (Signed)
Procedure Name: Intubation Date/Time: 02/22/2016 8:51 AM Performed by: Adonis Housekeeper Pre-anesthesia Checklist: Patient identified, Emergency Drugs available, Suction available and Patient being monitored Patient Re-evaluated:Patient Re-evaluated prior to inductionOxygen Delivery Method: Circle system utilized Preoxygenation: Pre-oxygenation with 100% oxygen Intubation Type: IV induction Ventilation: Mask ventilation without difficulty Laryngoscope Size: Glidescope and 4 Grade View: Grade I Tube type: Oral Tube size: 7.5 mm Number of attempts: 1 Airway Equipment and Method: Stylet and Video-laryngoscopy Placement Confirmation: ETT inserted through vocal cords under direct vision,  positive ETCO2 and breath sounds checked- equal and bilateral Secured at: 22 cm Tube secured with: Tape Dental Injury: Teeth and Oropharynx as per pre-operative assessment

## 2016-02-22 NOTE — Progress Notes (Signed)
ANTICOAGULATION CONSULT NOTE - Follow Up Consult  Pharmacy Consult for Heparin  Indication: Subacute MI/vascular occlusion  No Known Allergies  Patient Measurements: Height: 5\' 8"  (172.7 cm) Weight: 204 lb 5.9 oz (92.7 kg) IBW/kg (Calculated) : 68.4 Vital Signs: Temp: 98.2 F (36.8 C) (06/26 0400) Temp Source: Oral (06/26 0400) BP: 131/78 mmHg (06/26 0400) Pulse Rate: 88 (06/26 0400)  Labs:  Recent Labs  02/21/16 0827 02/21/16 1342 02/21/16 2026 02/21/16 2300 02/22/16 0400 02/22/16 0500  HGB 15.5  --   --  14.2  --  12.8*  HCT 42.8  --   --  41.3  --  37.9*  PLT 250  --   --  266  --  266  APTT  --  25  --   --   --   --   LABPROT  --  14.5  --   --   --   --   INR  --  1.11  --   --   --   --   HEPARINUNFRC  --   --   --   --  0.57  --   CREATININE 0.94  --   --   --   --   --   TROPONINI 0.84*  --  0.94*  --   --   --     Estimated Creatinine Clearance: 96.9 mL/min (by C-G formula based on Cr of 0.94).   Assessment: S/P right femoral embolectomy, initial heparin level is good at 0.57  Goal of Therapy:  Heparin level 0.3-0.7 units/ml Monitor platelets by anticoagulation protocol: Yes   Plan:  -Cont heparin at 1100 units/hr -1200 HL  Abran Duke 02/22/2016,6:24 AM

## 2016-02-22 NOTE — Progress Notes (Signed)
PT Cancellation Note  Patient Details Name: Troy Yu MRN: [id number] DOB: 1961/08/09   Cancelled Treatment:    Reason Eval/Treat Not Completed: Patient at procedure or test/unavailable (currently off floor for evacuation of hematoma ).  PT will continue to follow acutely.  Encarnacion Chu PT, DPT  Pager: [phone] Phone: [phone] 02/22/2016, 11:23 AM

## 2016-02-22 NOTE — Progress Notes (Signed)
   Daily Progress Note   Called to talk with the patient.  He is upset with the results of the second operation, i.e. the need for skin incision extension and application of VAC dressings.  He is also upset with his management overnight which he did not express to me earlier this AM.  - I discussed with the patient the intraoperative findings and what was done procedure wise.  - I also discussed with the patient that I had only received one call from the unit nurse overnight (documented in pager at 2:18) until this AM at 06:58, shortly before  I was starting AM rounds.  - He seems to believe that ACE bandage that was gently applied to the calf lead to need for his second operation.  I doubt the ACE bandage contributed significantly given the nurse documented persistence of pulses in the right foot.  - He does not seem to accept that his second operation was due to bleeding in his post-fasciotomy incisions with compromised skin.  I discussed this with the patient at bedside and called his son Ninfa Linden earlier.  - At this point, the patient would like a second opinion.  I have briefly discussed the case with Dr. Arbie Cookey, who will provide another opinion.   Leonides Sake, MD [facility] Office: [phone] Pager: [phone]  02/22/2016, 3:33 PM

## 2016-02-22 NOTE — Progress Notes (Signed)
Progress Note    Troy Yu  WUJ:[id number] DOB: 13-Aug-1961  DOA: 02/21/2016 PCP: No primary care provider on file.    Brief Narrative:   Troy Yu is an 55 y.o. male who was in his usual state of health until about 2 weeks ago when he had substernal chest pain that lasted about 48 hours.The pain went away after he vomited, and he attributed his symptoms to a prolonged religious fast.  He then come to the [facility] ED on 02/21/16 with severe RLE pain associated with paresthesias.  Upon presentation, he was found to have an abnormal EKG and elevated troponin.  Cardiology was subsequently called to evaluate the patient, and he was felt to have suffered a subacute anterior MI.  STAT echo was ordered and EDP advised to order an arterial duplex with recommendation to consult vascular surgery.  IV heparin ordered and the patient was transferred to [facility] for further evaluation by vascular surgery, as it was felt that he had a right femoral embolism with concern for threatened limb.  Dr Troy Yu subsequently performed a right femoral embolectomy and right calf compartment fasciotomies x 4 .  At 4:16 a.m. On 02/22/16, the patient complained of loss of sensation to RLE, and the patient reports he was in severe pain.  He was given pain medication which did not alleviate his symptoms. He was re-evaluated by Dr. Imogene Yu at 7:33 a.m. Who removed the ACE wrap from his RLE which relieved the pain, and the patient was felt to have excessive bleeding in his calf related to full anti-coagulation. He was taken back to surgery for evacuation of hematoma and VAC dressings. Cardiologist continues to follow for management of his subacute MI.  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  Wound VAC in place.  Case taken over by Dr. Arbie Yu after patient requested a second opinion. Will get FLP in a.m.  Active Problems:   Anterior subendocardial MI (HCC)/Abnormal EKG/Elevated troponin On therapeutic dose heparin.  Inderal started. ACE-I to be started when stable.  Will eventually need cardiac cath.    Newly diagnosed diabetes mellitus/Hyperglycemia Check hemoglobin A1c.  Currently on moderate scale SSI. CBGs 161-309. Add meal coverage and add Lantus, 10 units daily. Will ask DM coordinator to see.    Hyponatremia Likely from hyperglycemia.  Correct blood glucose and monitor.    Family Communication/Anticipated D/C date and plan/Code Status   DVT prophylaxis: On IV heparin. Code Status: Full Code.  Family Communication: Several family updated at bedside. Disposition Plan: Home when stable, likely will be in hospital for several more days.   Medical Consultants:    Cardiology  Vascular surgery   Procedures:   02/21/16 PROCEDURE: 1. Right femoral embolectomy 2. Right calf four compartment fasciotomies  02/22/16 PROCEDURE: 3. Evacuation of hematoma from bilateral right calf fasciotomies 4. Placement of negative pressure dressings x 2   Anti-Infectives:   Anti-infectives    Start     Dose/Rate Route Frequency Ordered Stop   02/22/16 0800  cefUROXime (ZINACEF) 1.5 g in dextrose 5 % 50 mL IVPB     1.5 g 100 mL/hr over 30 Minutes Intravenous Every 12 hours 02/21/16 2115 02/23/16 0759   02/22/16 0600  cefUROXime (ZINACEF) injection 1.5 g     1.5 g Intramuscular On call to O.R. 02/21/16 1523 02/21/16 1713      Subjective:   Troy Yu tells me he is very upset with his care.  He feels he suffered in terrible pain all night and was specifically upset that a doctor did not come an examine him when he was in such terrible pain.  He was tearful during our conversation, and  expressed disappointment at the care rendered.  He is especially upset at the appearance of his leg.  No complaints of chest pain, palpitations, dyspnea.  Objective:    Filed Vitals:   02/22/16 1245 02/22/16 1300 02/22/16 1315 02/22/16 1728  BP: 109/77  115/81 104/91  Pulse: 98 93 96 112  Temp: 97.6 F (36.4 C)  97.9 F (36.6 C) 100.4 F (38 C)  TempSrc:    Oral  Resp: 16 16 21 27   Height:      Weight:      SpO2: 100% 100% 100% 100%    Intake/Output Summary (Last 24 hours) at 02/22/16 1826 Last data filed at 02/22/16 1400  Gross per 24 hour  Intake 1909.5 ml  Output   1375 ml  Net  534.5 ml   Filed Weights   02/21/16 1322 02/21/16 2300 02/22/16 0400  Weight: 93.895 kg (207 lb) 91.1 kg (200 lb 13.4 oz) 92.7 kg (204 lb 5.9 oz)    Exam: General exam: Visibly upset, but otherwise no physical distress. Respiratory system: Clear to auscultation. Respiratory effort normal. Cardiovascular system: S1 & S2 heard, RRR. No JVD,  rubs, gallops or clicks. No murmurs. Gastrointestinal system: Abdomen is nondistended, soft and nontender. No organomegaly or masses felt. Normal bowel sounds heard. Central nervous system: Alert and oriented. No focal neurological deficits. Extremities: RLE with wound VAC in place, 1+ pulse, able to move toes. Skin: No rashes, lesions or ulcers. Psychiatry: Judgement and insight appear normal. Mood & affect anxious/distressed.   Data Reviewed:   I have personally reviewed following labs and imaging studies:  Labs: Basic Metabolic Panel:  Recent Labs Lab 02/21/16 0827 02/22/16 0400  NA 132* 130*  K 4.5 4.4  CL 100* 101  CO2 25 23  GLUCOSE 216* 303*  BUN 14 12  CREATININE 0.94 1.01  CALCIUM 8.6* 7.9*   GFR Estimated Creatinine Clearance: 90.2 mL/min (by C-G formula based on Cr of 1.01). Liver Function Tests: No results for input(s): AST, ALT, ALKPHOS, BILITOT, PROT, ALBUMIN in the last 168 hours. No results for input(s): LIPASE, AMYLASE in  the last 168 hours. No results for input(s): AMMONIA in the last 168 hours. Coagulation profile  Recent Labs Lab 02/21/16 1342  INR 1.11    CBC:  Recent Labs Lab 02/21/16 0827 02/21/16 2300 02/22/16 0500  WBC 10.1 11.3* 12.6*  NEUTROABS 7.9*  --   --   HGB 15.5 14.2 12.8*  HCT 42.8 41.3 37.9*  MCV 82.6 84.1 84.4  PLT 250 266 266   Cardiac Enzymes:  Recent Labs Lab 02/21/16 0827 02/21/16 2026 02/22/16 0400  TROPONINI 0.84* 0.94* 0.85*   BNP (last 3 results) No results for input(s): PROBNP in the last 8760 hours. CBG:  Recent Labs Lab 02/21/16 2020 02/21/16 2257 02/22/16 0822 02/22/16 1232  GLUCAP 176* 161* 309* 230*   D-Dimer: No results for input(s): DDIMER in the last 72 hours. Hgb A1c: No results for input(s): HGBA1C in the last 72 hours. Lipid Profile: No results for input(s): CHOL, HDL, LDLCALC,  TRIG, CHOLHDL, LDLDIRECT in the last 72 hours. Thyroid function studies: No results for input(s): TSH, T4TOTAL, T3FREE, THYROIDAB in the last 72 hours.  Invalid input(s): FREET3 Anemia work up: No results for input(s): VITAMINB12, FOLATE, FERRITIN, TIBC, IRON, RETICCTPCT in the last 72 hours. Sepsis Labs:  Recent Labs Lab 02/21/16 0827 02/21/16 2300 02/22/16 0500  WBC 10.1 11.3* 12.6*   Urine analysis: No results found for: COLORURINE, APPEARANCEUR, LABSPEC, PHURINE, GLUCOSEU, HGBUR, BILIRUBINUR, KETONESUR, PROTEINUR, UROBILINOGEN, NITRITE, LEUKOCYTESUR Microbiology Recent Results (from the past 240 hour(s))  MRSA PCR Screening     Status: None   Collection Time: 02/21/16 10:17 PM  Result Value Ref Range Status   MRSA by PCR NEGATIVE NEGATIVE Final    Comment:        The GeneXpert MRSA Assay (FDA approved for NASAL specimens only), is one component of a comprehensive MRSA colonization surveillance program. It is not intended to diagnose MRSA infection nor to guide or monitor treatment for MRSA infections.     Radiology: Dg Chest 2  View  02/21/2016  CLINICAL DATA:  Left lower extremity numbness and pain. EXAM: CHEST  2 VIEW COMPARISON:  None. FINDINGS: Normal heart size. Low lung volumes. Top-normal heart size. Mediastinal contour is within normal limits. No pneumothorax. No pleural effusion. Lungs appear clear, with no acute consolidative airspace disease and no pulmonary edema. IMPRESSION: Low lung volumes.  No active disease in the chest. Electronically Signed   By: Delbert Phenix M.D.   On: 02/21/2016 11:27   Ct Angio Ao+bifem W &/or Wo Contrast  02/21/2016  CLINICAL DATA:  Right lower extremity pain with diminished pulses and numbness. EXAM: CT ANGIOGRAPHY OF ABDOMINAL AORTA WITH ILIOFEMORAL RUNOFF TECHNIQUE: Multidetector CT imaging of the abdomen, pelvis and lower extremities was performed using the standard protocol during bolus administration of intravenous contrast. Multiplanar CT image reconstructions and MIPs were obtained to evaluate the vascular anatomy. CONTRAST:  100 mL Isovue 370 IV COMPARISON:  None. FINDINGS: Aorta: Normally patent abdominal aorta without evidence of atherosclerosis, aneurysm or dissection. Visceral arteries are widely patent including the celiac axis, superior mesenteric artery, inferior mesenteric artery and bilateral single renal arteries. No embolic occlusions identified in the abdomen or pelvis. Right Lower Extremity: Widely patent right common, external and internal iliac arteries. The common femoral artery is normally patent up to its bifurcation. At the femoral bifurcation, there is nearly occlusive thrombus identified extending into both proximal profunda femoral and superficial femoral artery trunks. Contrast flow is seen beyond the thrombus and into normal distal profunda femoral branches. The SFA is open, but demonstrates some anterior mural thrombus. In the absence of significant atherosclerosis, this may represent some adherent thrombus from recent/prior embolic event. Anterior mural thrombus  continues into the popliteal artery above the knee. Below the knee, there is only faint opacification of the distal popliteal artery and no visualized opacification of the tibial arteries. This is likely due to slow flow. However, component of thrombus in the tibial arteries cannot be entirely excluded. Left Lower Extremity: Left-sided arterial supply is completely normal including the common femoral artery, profunda femoral artery, superficial femoral artery, popliteal artery and tibial arteries. Or there is some narrowing of the popliteal artery just above the knee joint of approximately 40-50% is associated with a slightly tortuous/ course. No plaque is identified at this level. A component of incidental popliteal entrapment cannot be excluded. Review of the MIP images confirms the above findings. Lower chest:  Bibasilar atelectasis.  No pleural effusions. Hepatobiliary:  Unremarkable arterial phase imaging of the liver and gallbladder. Pancreas: Unremarkable appearance. Spleen: Normal appearance of spleen. Adrenals/Urinary Tract: Normal adrenal glands and kidneys. No renal infarcts identified. Stomach/Bowel: Normal appearance without evidence to suggest mesenteric ischemia or inflammation. No obstruction. Normal appendix. No free air, free fluid or abscess. Lymphatic: No enlarged lymph nodes identified. Reproductive: Negative. Other: No hernias. Musculoskeletal: Bony structures are normal. IMPRESSION: 1. Embolic thrombus at the bifurcation of the right common femoral artery with thrombus extending into proximal profunda femoral and superficial femoral arteries. Thrombus is nearly occlusive but contrast is seen to flow around the thrombus into the distal vessels. There is also a component of probable anterior mural thrombus in the SFA and popliteal arteries which may be consistent with additional adherent thrombus. Tibial arteries are not adequately opacified on the right, most likely secondary to slow flow.  Distal thrombus cannot be excluded. Vascular Surgical consultation is recommended. 2. Incidental finding of a mildly tortuous course of the popliteal artery on the left associated with some narrowing just above the knee joint. This narrowing does not appear atherosclerotic and there may be an incidental component of popliteal entrapment syndrome on the left. These results were called by telephone at the time of interpretation on 02/21/2016 at 2:55 pm to Dr. Zadie Rhine , who verbally acknowledged these results. Electronically Signed   By: Irish Lack M.D.   On: 02/21/2016 14:56    Medications:   . aspirin  325 mg Oral Daily  . cefUROXime (ZINACEF)  IV  1.5 g Intravenous Q12H  . docusate sodium  100 mg Oral Daily  . insulin aspart  0-15 Units Subcutaneous TID WC  . insulin aspart  0-5 Units Subcutaneous QHS  . pantoprazole  40 mg Oral Daily  . propranolol  10 mg Oral TID   Continuous Infusions: . sodium chloride 75 mL/hr at 02/21/16 2230  . heparin    . lactated ringers 50 mL/hr at 02/22/16 0826    Time spent: 35 minutes.  The patient is medically complex with multiple co-morbidities and is at high risk for clinical deterioration and requires high complexity decision making.    LOS: 1 day   RAMA,CHRISTINA  [facility] Pager [phone]. If unable to reach me by pager, please call my cell phone at [phone].  *Please refer to [email or web address], password [email or web address] to get updated schedule on who will round on this patient, as hospitalists switch teams weekly. If 7PM-7AM, please contact night-coverage at [email or web address], password [email or web address] for any overnight needs.  02/22/2016, 6:26 PM

## 2016-02-22 NOTE — Progress Notes (Addendum)
   Daily Progress Note  Assessment/Planning: POD #1 s/p R fem TE, R calf fasciotomy   Excessive bleeding in calf, likely related to full anticoagulation  Will need to evacuate hematoma and likely convert the fasciotomy incision to full thickness and apply VAC dressings  Pt still has palpable pulses  Subjective  - 1 Day Post-Op  C/o pain in R calf (relieved by removing ACE wrap)  Objective Filed Vitals:   02/22/16 0400 02/22/16 0500 02/22/16 0600 02/22/16 0700  BP: 131/78     Pulse: 88 87 101 100  Temp: 98.2 F (36.8 C)     TempSrc: Oral     Resp: 16 21 15 13   Height:      Weight: 204 lb 5.9 oz (92.7 kg)     SpO2: 100% 99% 98% 99%    Intake/Output Summary (Last 24 hours) at 02/22/16 0733 Last data filed at 02/22/16 0700  Gross per 24 hour  Intake 1409.5 ml  Output   2295 ml  Net -885.5 ml    PULM  RRR CV  Mild tachy, RRR GI  soft, NTND VASC  L calf swollen: medial > lateral, some skin blistering, palpable DP and and AT, dopplerable PT, weak DF/PF sec. To pain  Laboratory CBC    Component Value Date/Time   WBC 12.6* 02/22/2016 0500   HGB 12.8* 02/22/2016 0500   HCT 37.9* 02/22/2016 0500   PLT 266 02/22/2016 0500    BMET    Component Value Date/Time   NA 130* 02/22/2016 0400   K 4.4 02/22/2016 0400   CL 101 02/22/2016 0400   CO2 23 02/22/2016 0400   GLUCOSE 303* 02/22/2016 0400   BUN 12 02/22/2016 0400   CREATININE 1.01 02/22/2016 0400   CALCIUM 7.9* 02/22/2016 0400   GFRNONAA >60 02/22/2016 0400   GFRAA >60 02/22/2016 0400    Leonides Sake, MD [facility] Office: [phone] Pager: [phone]  02/22/2016, 7:33 AM   Addendum  DF/PF improved after ACE wrap removed.  Paraesthesia improved.  Leonides Sake, MD [facility] [place] Office: [phone] Pager: [phone]  02/22/2016, 8:03 AM

## 2016-02-22 NOTE — Progress Notes (Signed)
Paged Dr. Imogene Burn about Pt. Losing sensation in his right leg associated with numbness. New onset of pain and pt complaints of coolness in toes. Informed Dr. Imogene Burn that there are still +1 pulses throughout right leg. MD said to administer PRN pain meds and continue to monitor.

## 2016-02-22 NOTE — Progress Notes (Signed)
Patient ID: Troy Yu, male   DOB: 09/06/1960, 55 y.o.   MRN: [id number]    Subjective:  Seen this am before OR  Denies SSCP, palpitations or Dyspnea Right leg hurts and has to go back to Or  Objective:  Filed Vitals:   02/22/16 0500 02/22/16 0600 02/22/16 0700 02/22/16 0743  BP:    108/85  Pulse: 87 101 100 98  Temp:    98.1 F (36.7 C)  TempSrc:    Oral  Resp: 21 15 13 12   Height:      Weight:      SpO2: 99% 98% 99% 100%    Intake/Output from previous day:  Intake/Output Summary (Last 24 hours) at 02/22/16 3845 Last data filed at 02/22/16 3646  Gross per 24 hour  Intake 1409.5 ml  Output   2345 ml  Net -935.5 ml    Physical Exam: Affect appropriate Healthy:  appears stated age HEENT: normal Neck supple with no adenopathy JVP normal no bruits no thyromegaly Lungs clear with no wheezing and good diaphragmatic motion Heart:  S1/S2 no murmur, no rub, gallop or click PMI normal Abdomen: benighn, BS positve, no tenderness, no AAA no bruit.  No HSM or HJR Distal pulses intact with no bruits No edema Neuro non-focal Skin warm and dry Post RLE embolectomy and fasciotomy with bleeding in calf.   Lab Results: Basic Metabolic Panel:  Recent Labs  80/32/12 0827 02/22/16 0400  NA 132* 130*  K 4.5 4.4  CL 100* 101  CO2 25 23  GLUCOSE 216* 303*  BUN 14 12  CREATININE 0.94 1.01  CALCIUM 8.6* 7.9*   CBC:  Recent Labs  02/21/16 0827 02/21/16 2300 02/22/16 0500  WBC 10.1 11.3* 12.6*  NEUTROABS 7.9*  --   --   HGB 15.5 14.2 12.8*  HCT 42.8 41.3 37.9*  MCV 82.6 84.1 84.4  PLT 250 266 266   Cardiac Enzymes:  Recent Labs  02/21/16 0827 02/21/16 2026 02/22/16 0400  TROPONINI 0.84* 0.94* 0.85*    Imaging: Dg Chest 2 View  02/21/2016  CLINICAL DATA:  Left lower extremity numbness and pain. EXAM: CHEST  2 VIEW COMPARISON:  None. FINDINGS: Normal heart size. Low lung volumes. Top-normal heart size. Mediastinal contour is within normal limits. No  pneumothorax. No pleural effusion. Lungs appear clear, with no acute consolidative airspace disease and no pulmonary edema. IMPRESSION: Low lung volumes.  No active disease in the chest. Electronically Signed   By: Delbert Phenix M.D.   On: 02/21/2016 11:27   Ct Angio Ao+bifem W &/or Wo Contrast  02/21/2016  CLINICAL DATA:  Right lower extremity pain with diminished pulses and numbness. EXAM: CT ANGIOGRAPHY OF ABDOMINAL AORTA WITH ILIOFEMORAL RUNOFF TECHNIQUE: Multidetector CT imaging of the abdomen, pelvis and lower extremities was performed using the standard protocol during bolus administration of intravenous contrast. Multiplanar CT image reconstructions and MIPs were obtained to evaluate the vascular anatomy. CONTRAST:  100 mL Isovue 370 IV COMPARISON:  None. FINDINGS: Aorta: Normally patent abdominal aorta without evidence of atherosclerosis, aneurysm or dissection. Visceral arteries are widely patent including the celiac axis, superior mesenteric artery, inferior mesenteric artery and bilateral single renal arteries. No embolic occlusions identified in the abdomen or pelvis. Right Lower Extremity: Widely patent right common, external and internal iliac arteries. The common femoral artery is normally patent up to its bifurcation. At the femoral bifurcation, there is nearly occlusive thrombus identified extending into both proximal profunda femoral and superficial femoral artery trunks. Contrast flow  is seen beyond the thrombus and into normal distal profunda femoral branches. The SFA is open, but demonstrates some anterior mural thrombus. In the absence of significant atherosclerosis, this may represent some adherent thrombus from recent/prior embolic event. Anterior mural thrombus continues into the popliteal artery above the knee. Below the knee, there is only faint opacification of the distal popliteal artery and no visualized opacification of the tibial arteries. This is likely due to slow flow. However,  component of thrombus in the tibial arteries cannot be entirely excluded. Left Lower Extremity: Left-sided arterial supply is completely normal including the common femoral artery, profunda femoral artery, superficial femoral artery, popliteal artery and tibial arteries. Or there is some narrowing of the popliteal artery just above the knee joint of approximately 40-50% is associated with a slightly tortuous/ course. No plaque is identified at this level. A component of incidental popliteal entrapment cannot be excluded. Review of the MIP images confirms the above findings. Lower chest:  Bibasilar atelectasis.  No pleural effusions. Hepatobiliary: Unremarkable arterial phase imaging of the liver and gallbladder. Pancreas: Unremarkable appearance. Spleen: Normal appearance of spleen. Adrenals/Urinary Tract: Normal adrenal glands and kidneys. No renal infarcts identified. Stomach/Bowel: Normal appearance without evidence to suggest mesenteric ischemia or inflammation. No obstruction. Normal appendix. No free air, free fluid or abscess. Lymphatic: No enlarged lymph nodes identified. Reproductive: Negative. Other: No hernias. Musculoskeletal: Bony structures are normal. IMPRESSION: 1. Embolic thrombus at the bifurcation of the right common femoral artery with thrombus extending into proximal profunda femoral and superficial femoral arteries. Thrombus is nearly occlusive but contrast is seen to flow around the thrombus into the distal vessels. There is also a component of probable anterior mural thrombus in the SFA and popliteal arteries which may be consistent with additional adherent thrombus. Tibial arteries are not adequately opacified on the right, most likely secondary to slow flow. Distal thrombus cannot be excluded. Vascular Surgical consultation is recommended. 2. Incidental finding of a mildly tortuous course of the popliteal artery on the left associated with some narrowing just above the knee joint. This  narrowing does not appear atherosclerotic and there may be an incidental component of popliteal entrapment syndrome on the left. These results were called by telephone at the time of interpretation on 02/21/2016 at 2:55 pm to Dr. Zadie Rhine , who verbally acknowledged these results. Electronically Signed   By: Irish Lack M.D.   On: 02/21/2016 14:56    Cardiac Studies:  ECG:  SR anterior MI sub acute    Telemetry:  SR occasional PVCls   Echo: EF 30-35% anterior MI apical thrombus large burden  Medications:   . sodium chloride   Intravenous STAT  . [MAR Hold] aspirin  325 mg Oral Daily  . [MAR Hold] cefUROXime (ZINACEF)  IV  1.5 g Intravenous Q12H  . [MAR Hold] docusate sodium  100 mg Oral Daily  . [MAR Hold] insulin aspart  0-15 Units Subcutaneous TID WC  . [MAR Hold] insulin aspart  0-5 Units Subcutaneous QHS  . [MAR Hold] pantoprazole  40 mg Oral Daily     . sodium chloride 75 mL/hr at 02/21/16 2230  . heparin Stopped (02/22/16 0731)  . lactated ringers 50 mL/hr at 02/22/16 7829    Assessment/Plan:  Anterior MI:  Subacute 2 weeks ago with mural thrombus. Cath will have to wait definitive Rx and recovery from LE  Thrombectomy and now need for full thickness fasciotomy from bleeding  Lasix as needed to keep I/O even At some point  will need beta blocker and ACE  PV:  In OR now for revision full thickness fasciotomy Still with dopplerable PT/DP  Will take him a while to  Recover from all this   Charlton Haws 02/22/2016, 9:39 AM

## 2016-02-22 NOTE — Progress Notes (Signed)
Pts CBG 250, anesthesia called and order received for 5 uts sliding scale

## 2016-02-22 NOTE — Anesthesia Preprocedure Evaluation (Signed)
Anesthesia Evaluation  Patient identified by MRN, date of birth, ID band Patient awake    Reviewed: Allergy & Precautions, NPO status , Patient's Chart, lab work & pertinent test resultsPreop documentation limited or incomplete due to emergent nature of procedure.  History of Anesthesia Complications Negative for: history of anesthetic complications  Airway Mallampati: I  TM Distance: >3 FB Neck ROM: Full    Dental  (+) Poor Dentition, Loose, Missing, Dental Advisory Given   Pulmonary former smoker,    breath sounds clear to auscultation       Cardiovascular (-) angina+ Past MI (anterior MI 2 weeks ago, possibly extending today) and + Peripheral Vascular Disease   Rhythm:Regular Rate:Normal  ECHO today 02/21/16: Left ventricle: Anteroapical infarct with large thrombus burden at apex. Mobile and high embolic potential. The cavity size was severely dilated. Wall thickness was normal. Systolic function was moderately to severely reduced. The estimated ejection fraction was in the range of 30% to 35%   Neuro/Psych negative neurological ROS     GI/Hepatic negative GI ROS, Neg liver ROS,   Endo/Other  diabetesMorbid obesity  Renal/GU negative Renal ROS     Musculoskeletal   Abdominal (+) + obese,   Peds  Hematology negative hematology ROS (+)   Anesthesia Other Findings   Reproductive/Obstetrics                             Anesthesia Physical  Anesthesia Plan  ASA: IV and emergent  Anesthesia Plan: General   Post-op Pain Management:    Induction: Intravenous, Rapid sequence and Cricoid pressure planned  Airway Management Planned: Oral ETT  Additional Equipment:   Intra-op Plan:   Post-operative Plan: Possible Post-op intubation/ventilation  Informed Consent: I have reviewed the patients History and Physical, chart, labs and discussed the procedure including the risks, benefits and  alternatives for the proposed anesthesia with the patient or authorized representative who has indicated his/her understanding and acceptance.   Dental advisory given  Plan Discussed with: CRNA  Anesthesia Plan Comments:         Anesthesia Quick Evaluation

## 2016-02-22 NOTE — Progress Notes (Signed)
Patient ID: Troy Yu, male   DOB: 03-19-1961, 55 y.o.   MRN: [id number] Long discussion with patient and family They have been very upset about care by Dr Imogene Burn Apparently Dr Arbie Cookey is taking over vascular case Pain control seems to be good HR in 120's ECG no flutter ST Will start inderal 10 tid give recent large anterior MI  Charlton Haws

## 2016-02-22 NOTE — Anesthesia Postprocedure Evaluation (Signed)
Anesthesia Post Note  Patient: Troy Yu  Procedure(s) Performed: Procedure(s) (LRB): EVACUATION HEMATOMA With Placement of Negative Pressure Dressing (Right) APPLICATION OF Negative Pressure Dressing Right Lower Leg (Right)  Patient location during evaluation: PACU Anesthesia Type: General Level of consciousness: sedated and patient cooperative Pain management: pain level controlled Vital Signs Assessment: post-procedure vital signs reviewed and stable Respiratory status: spontaneous breathing Cardiovascular status: stable Anesthetic complications: no    Last Vitals:  Filed Vitals:   02/22/16 1300 02/22/16 1315  BP:  115/81  Pulse: 93 96  Temp:  36.6 C  Resp: 16 21    Last Pain:  Filed Vitals:   02/22/16 1318  PainSc: 6                  Isley Zinni [employer]

## 2016-02-22 NOTE — Progress Notes (Signed)
Patient ID: Troy Yu, male   DOB: 12/29/1960, 55 y.o.   MRN: [id number] Dr. Imogene Burn asked that I review the patient and evaluate and discuss with the patient his care. This was at the patient's request. I have reviewed his chart and his CT angiogram. He presented with severe ischemia related to occlusion of his common superficial femoral and profundus femoris artery. He underwent emergent thrombectomy and fasciotomy yesterday with Dr. Imogene Burn. He had the continued presence of thrombus in his heart and therefore was maintained on anticoagulation therapy. He had significant pain overnight and was found to have a hematoma requiring return to the operating room for evacuation and completion of skin bridges for the fasciotomy. He had a VAC placed. The patient has a very upset regarding indications for and timing of his re- intervention. He is convinced that that this is all related to his Ace wrap placed to tight. I explained that this is a routine following this type of procedure and that he was found to have good flow to his foot overnight. He does report immediately relief when this was removed this morning. I explained that subcutaneous hematoma in light of the pressure dressing most likely was the cause of his pain. I explained that fortunately has normal flow to his foot and currently is does have a 2-3+ dorsalis pedis pulse. His fasciotomy I dressing wound vacs are intact and is a groin has no evidence of hematoma and is soft with the JP drain present. He does have decreased sensation in his foot but does have apparently good motor function and is able to access foot.  He remains very upset regarding his care. I have agreed to assume care of the patient at his request. I have discussed this with Dr. Imogene Burn as well. Also discussed with multiple family members present.

## 2016-02-22 NOTE — Op Note (Signed)
    OPERATIVE NOTE   PROCEDURE: 1. Evacuation of hematoma from bilateral right calf fasciotomies 2. Placement of negative pressure dressings x 2  PRE-OPERATIVE DIAGNOSIS: right calf hematoma  POST-OPERATIVE DIAGNOSIS: same as above   SURGEON: Leonides Sake, MD  ASSISTANT(S): Karsten Ro, PAC   ANESTHESIA: general  ESTIMATED BLOOD LOSS: 50 cc  FINDING(S): 1. Limited bleeding and hematoma in anterior and lateral compartments: viable muscle throughout without bulge 2. Extensive hematoma in superficial and deep posterior compartments: viable muscle throughout with mild muscle bulge 3.  Fasciotomies already completed in all compartments  SPECIMEN(S):  none  INDICATIONS:   Troy Yu is a 55 y.o. male who presents with right calf swelling with compromised skin the the setting of right femoral embolectomy and calf fasciotomies done for subacute myocardial infarction with embolization of LV thrombus.  Full anticoagulation was continued due to LV thrombus presence.  I recommended evacuation of hematoma to prevent any further skin necrosis due to pressure build up.  DESCRIPTION: After obtaining full informed written consent, the patient was brought back to the operating room and placed supine upon the operating table.  The patient received IV antibiotics prior to induction.  After obtaining adequate anesthesia, the patient was prepped and draped in the standard fashion for: right calf exploration.  I removed the staples from the medial incision:  Immediately pressurized serous fluid began draining.  Eventually there was some limited bleeding noted.  I extended the medial fasciotomy incision, connecting the prior incisions.  There was some mild muscle bulge.  The deep and superficial posterior compartment fasciotomies were all complete.  All muscles were viable and reactive to electrostimulation.  I extended the skin incision proximally and distally.  I washed out the medial fasciotomy incision with  a Pulsavac and sterile normal saline.  I manually evacuated some hematoma.  I packed this incision and turned my attention to the lateral incisions.  I took out the staples here and sucked out some venous blood from this fasciotomy incision.  I extended the fasciotomy incisions proximally and distally and connected the two prior incisions.  The anterior and lateral fasciotomies were already completed.  There was no active bleeding.  The muscle in each compartment was viable throughout and were reactive to electrostimulation.  I washed out the lateral fasciotomy incision with a Pulsavac and sterile normal saline.    At this point, venous oozing was controlled with electrocautery in all incisions.  I washed off the leg and then dried the leg.  Large VAC sponges were fashioned for both fasciotomy incisions.  There were secured with adhesive strips.  A hole was cut in the strips overlying each incision.  A lilypad was applied to both incisions.  The two tubes were connected to a Y-connected and connected to the a VAC pump.  The pump was activated and then the [facility] dressing became adherent.  I had to reinforce some areas with additional strips.  The plan is to resume anticoagulation in 8 hours given the significant bleeding in the calf.  Will try to close the fasciotomies on Thursday.  I expect the lateral fasciotomy incision will close easily.  The medial incision is likely to be difficult to close.   COMPLICATIONS: none  CONDITION: stable   Leonides Sake, MD [facility] Office: [phone] Pager: [phone]  02/22/2016, 10:24 AM

## 2016-02-23 ENCOUNTER — Encounter (HOSPITAL_COMMUNITY): Payer: Self-pay

## 2016-02-23 ENCOUNTER — Encounter (HOSPITAL_COMMUNITY): Payer: Self-pay | Admitting: Vascular Surgery

## 2016-02-23 DIAGNOSIS — I998 Other disorder of circulatory system: Secondary | ICD-10-CM | POA: Insufficient documentation

## 2016-02-23 DIAGNOSIS — I749 Embolism and thrombosis of unspecified artery: Secondary | ICD-10-CM | POA: Insufficient documentation

## 2016-02-23 LAB — CBC
HEMATOCRIT: 27 % — AB (ref 39.0–52.0)
HEMATOCRIT: 25.3 % — AB (ref 39.0–52.0)
Hemoglobin: 9.4 g/dL — ABNORMAL LOW (ref 13.0–17.0)
Hemoglobin: 8.8 g/dL — ABNORMAL LOW (ref 13.0–17.0)
MCH: 29.2 pg (ref 26.0–34.0)
MCH: 29.4 pg (ref 26.0–34.0)
MCHC: 34.8 g/dL (ref 30.0–36.0)
MCHC: 34.8 g/dL (ref 30.0–36.0)
MCV: 83.9 fL (ref 78.0–100.0)
MCV: 84.6 fL (ref 78.0–100.0)
PLATELETS: 178 10*3/uL (ref 150–400)
PLATELETS: 197 10*3/uL (ref 150–400)
RBC: 3.22 MIL/uL — ABNORMAL LOW (ref 4.22–5.81)
RBC: 2.99 MIL/uL — ABNORMAL LOW (ref 4.22–5.81)
RDW: 12 % (ref 11.5–15.5)
RDW: 12 % (ref 11.5–15.5)
WBC: 7.8 10*3/uL (ref 4.0–10.5)
WBC: 8.3 10*3/uL (ref 4.0–10.5)

## 2016-02-23 LAB — COMPREHENSIVE METABOLIC PANEL
ALK PHOS: 35 U/L — AB (ref 38–126)
ALT: 17 U/L (ref 17–63)
AST: 15 U/L (ref 15–41)
Albumin: 2.1 g/dL — ABNORMAL LOW (ref 3.5–5.0)
Anion gap: 6 (ref 5–15)
BILIRUBIN TOTAL: 0.5 mg/dL (ref 0.3–1.2)
BUN: 10 mg/dL (ref 6–20)
CALCIUM: 7.5 mg/dL — AB (ref 8.9–10.3)
CO2: 24 mmol/L (ref 22–32)
CREATININE: 0.97 mg/dL (ref 0.61–1.24)
Chloride: 102 mmol/L (ref 101–111)
Glucose, Bld: 275 mg/dL — ABNORMAL HIGH (ref 65–99)
Potassium: 3.5 mmol/L (ref 3.5–5.1)
Sodium: 132 mmol/L — ABNORMAL LOW (ref 135–145)
TOTAL PROTEIN: 4.6 g/dL — AB (ref 6.5–8.1)

## 2016-02-23 LAB — LIPID PANEL
CHOLESTEROL: 116 mg/dL (ref 0–200)
HDL: 30 mg/dL — AB (ref >40)
LDL CALC: 60 mg/dL (ref 0–99)
TRIGLYCERIDES: 130 mg/dL (ref <150)
Total CHOL/HDL Ratio: 3.9 RATIO
VLDL: 26 mg/dL (ref 0–40)

## 2016-02-23 LAB — GLUCOSE, CAPILLARY
Glucose-Capillary: 208 mg/dL — ABNORMAL HIGH (ref 65–99)
Glucose-Capillary: 210 mg/dL — ABNORMAL HIGH (ref 65–99)
Glucose-Capillary: 223 mg/dL — ABNORMAL HIGH (ref 65–99)
Glucose-Capillary: 168 mg/dL — ABNORMAL HIGH (ref 65–99)

## 2016-02-23 LAB — HEPARIN LEVEL (UNFRACTIONATED)
HEPARIN UNFRACTIONATED: 0.18 [IU]/mL — AB (ref 0.30–0.70)
HEPARIN UNFRACTIONATED: 0.1 [IU]/mL — AB (ref 0.30–0.70)
Heparin Unfractionated: 0.36 IU/mL (ref 0.30–0.70)

## 2016-02-23 MED ORDER — INSULIN STARTER KIT- PEN NEEDLES (ENGLISH)
1.0000 | Freq: Once | Status: AC
  Administered 2016-02-23: 1
  Filled 2016-02-23: qty 1

## 2016-02-23 MED ORDER — PROPRANOLOL HCL 10 MG PO TABS
10.0000 mg | ORAL_TABLET | Freq: Three times a day (TID) | ORAL | Status: DC
  Administered 2016-02-23 – 2016-03-08 (×36): 10 mg via ORAL
  Filled 2016-02-23 (×48): qty 1

## 2016-02-23 MED ORDER — LIVING WELL WITH DIABETES BOOK
Freq: Once | Status: AC
  Administered 2016-02-23: 16:00:00
  Filled 2016-02-23: qty 1

## 2016-02-23 MED ORDER — ATORVASTATIN CALCIUM 80 MG PO TABS
80.0000 mg | ORAL_TABLET | Freq: Every day | ORAL | Status: DC
  Administered 2016-02-23 – 2016-03-16 (×23): 80 mg via ORAL
  Filled 2016-02-23 (×24): qty 1

## 2016-02-23 NOTE — Evaluation (Signed)
Physical Therapy Evaluation Patient Details Name: Troy Yu MRN: [id number] DOB: September 01, 1960 Today's Date: 02/23/2016   History of Present Illness  55 y.o. male with no significant hx of cad/pvd/angina/claudication. 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 adm with complaints of red right leg pain; + NSTEMI subacute; EF 30-35%; +left ventricle thrombus; +Rt femoral embolism, 6/25 femoral embolectomy and four compartment calf fasciotomies; 6/26 Evacuation of hematoma from bilateral right calf fasciotomies with placement of negative pressure dressings x 2     Clinical Impression  Patient is s/p above surgeries resulting in functional limitations due to the deficits listed below (see PT Problem List). Patient with good family support (son to care for him). Patient will benefit from skilled PT to increase their independence and safety with mobility to allow discharge to the venue listed below.      Follow Up Recommendations Home health PT;Supervision for mobility/OOB    Equipment Recommendations  Rolling walker with 5" wheels (possible wheelchair (depending on progress))    Recommendations for Other Services   none    Precautions / Restrictions Precautions Precautions: Fall      Mobility  Bed Mobility Overal bed mobility: + 2 for safety/equipment;Needs Assistance Bed Mobility: Supine to Sit     Supine to sit: Min guard;HOB elevated;+2 for safety/equipment     General bed mobility comments: moving slowly, but no assist needed  Transfers Overall transfer level: Needs assistance Equipment used: Rolling walker (2 wheeled) Transfers: Sit to/from [employer] Transfers Sit to Stand: Min assist;+2 safety/equipment;+2 physical assistance Stand pivot transfers: Min assist       General transfer comment: pt holding Rt foot off floor and hopping (small hops) on  LT  Ambulation/Gait             General Gait Details: unable due to lines and dizziness  Stairs            Wheelchair Mobility    Modified Rankin (Stroke Patients Only)       Balance Overall balance assessment: Needs assistance Sitting-balance support: No upper extremity supported;Feet supported Sitting balance-Leahy Scale: Fair     Standing balance support: Bilateral upper extremity supported Standing balance-Leahy Scale: Poor Standing balance comment: min to steady                             Pertinent Vitals/Pain See vitals flow sheet. (BP with MAP 69 throughout) HR mid 80s  Pain Assessment: 0-10 Pain Score: 5  Pain Location: Rt calf at rest; up to 7 with OOB Pain Descriptors / Indicators: Operative site guarding Pain Intervention(s): Limited activity within patient's tolerance;Monitored during session;Repositioned    Home Living Family/patient expects to be discharged to:: Private residence Living Arrangements: Children (pt did not mention a spouse) Available Help at Discharge: Family;Available 24 hours/day ( son 24/7) Type of Home: Other(Comment) (condo) Home Access: Level entry;Other (comment) (curb from parking lot)     Home Layout: One level Home Equipment: None      Prior Function Level of Independence: Independent               Hand Dominance        Extremity/Trunk Assessment   Upper Extremity Assessment: Defer to OT evaluation;Overall [facility] for tasks assessed           Lower Extremity Assessment: RLE deficits/detail RLE  Deficits / Details: AAROM hip/knee flexion in supine to 80; able to DF ankle to neutral;    Cervical / Trunk Assessment: Normal  Communication   Communication: No difficulties  Cognition Arousal/Alertness: Awake/alert Behavior During Therapy: WFL for tasks assessed/performed Overall Cognitive Status: Within Functional Limits for tasks assessed                      General  Comments   All incisions clean and dry pre and post activity. VAC contained 300 cc pre and ~320 post.     Exercises General Exercises - Lower Extremity Ankle Circles/Pumps: AROM;Right;5 reps Quad Sets: AROM;Right;Other reps (comment) Heel Slides: AAROM;Right;5 reps Hip ABduction/ADduction: AROM;Right;Other reps (comment) Other Exercises Other Exercises: rt leg internal rotation; educated re: try no pillow under knee to allow full extension      Assessment/Plan    PT Assessment Patient needs continued PT services  PT Diagnosis Difficulty walking;Acute pain   PT Problem List Decreased range of motion;Decreased activity tolerance;Decreased balance;Decreased mobility;Decreased knowledge of use of DME;Impaired sensation;Pain  PT Treatment Interventions DME instruction;Gait training;Stair training;Functional mobility training;Therapeutic activities;Therapeutic exercise;Patient/family education   PT Goals (Current goals can be found in the Care Plan section) Acute Rehab PT Goals Patient Stated Goal: heal leg and walk PT Goal Formulation: With patient Time For Goal Achievement: 03/01/16 Potential to Achieve Goals: Good    Frequency Min 4X/week   Barriers to discharge        Co-evaluation               End of Session Equipment Utilized During Treatment: Gait belt Activity Tolerance: Patient tolerated treatment well Patient left: in chair;with call bell/phone within reach;with family/visitor present;with nursing/sitter in room Nurse Communication: Mobility status         Time: 1610-9604 PT Time Calculation (min) (ACUTE ONLY): 43 min   Charges:   PT Evaluation $PT Eval Low Complexity: 1 Procedure PT Treatments $Therapeutic Exercise: 8-22 mins $Therapeutic Activity: 8-22 mins   PT G Codes:        Tishawna Larouche 03/17/16, 12:40 PM Pager [phone]

## 2016-02-23 NOTE — Progress Notes (Addendum)
ANTICOAGULATION CONSULT NOTE - Follow Up Consult  Pharmacy Consult for Heparin  Indication: Subacute MI/vascular occlusion  No Known Allergies  Patient Measurements: Height: 5\' 8"  (172.7 cm) Weight: 206 lb 9.1 oz (93.7 kg) IBW/kg (Calculated) : 68.4  Heparin dosing wt: 87kg  Vital Signs: Temp: 100.2 F (37.9 C) (06/27 1200) Temp Source: Oral (06/27 1200) BP: 96/57 mmHg (06/27 1200) Pulse Rate: 80 (06/27 1200)  Labs:  Recent Labs  02/21/16 0827 02/21/16 1342 02/21/16 2026  02/22/16 0400 02/22/16 0500 02/23/16 0339 02/23/16 1325 02/23/16 1349  HGB 15.5  --   --   < >  --  12.8* 9.4* 8.8*  --   HCT 42.8  --   --   < >  --  37.9* 27.0* 25.3*  --   PLT 250  --   --   < >  --  266 178 197  --   APTT  --  25  --   --   --   --   --   --   --   LABPROT  --  14.5  --   --   --   --   --   --   --   INR  --  1.11  --   --   --   --   --   --   --   HEPARINUNFRC  --   --   --   --  0.57  --  0.18*  --  0.10*  CREATININE 0.94  --   --   --  1.01  --  0.97  --   --   TROPONINI 0.84*  --  0.94*  --  0.85*  --   --   --   --   < > = values in this interval not displayed.  Estimated Creatinine Clearance: 94.4 mL/min (by C-G formula based on Cr of 0.97).   Assessment: 55 yo male with LV thrombus and R femoral embolism s/p embolectomy on 6/25 and now s/p hematoma evacuation. Heparin level has decreased after the last infusion change. -HL= 0.1 on 1050 units/hr  (was at goal previously on 1100 units/hr) -hg= 8.8 with trend down (likely post op anemia)  Goal of Therapy:  Heparin level 0.3-0.7 units/ml, low end for now with calf hematoma Monitor platelets by anticoagulation protocol: Yes   Plan:  -Increase heparin to 1200 units/hr  -Heparin level in 8 hours and daily wth CBC daily  Harland German, Pharm D 02/23/2016 3:05 PM

## 2016-02-23 NOTE — Progress Notes (Signed)
ANTICOAGULATION CONSULT NOTE - Follow Up Consult  Pharmacy Consult for Heparin  Indication: Subacute MI/vascular occlusion  No Known Allergies  Patient Measurements: Height: 5\' 8"  (172.7 cm) Weight: 204 lb 5.9 oz (92.7 kg) IBW/kg (Calculated) : 68.4 Vital Signs: Temp: 98.5 F (36.9 C) (06/27 0400) Temp Source: Oral (06/27 0400) BP: 107/63 mmHg (06/27 0400) Pulse Rate: 81 (06/26 2145)  Labs:  Recent Labs  02/21/16 0827 02/21/16 1342 02/21/16 2026 02/21/16 2300 02/22/16 0400 02/22/16 0500 02/23/16 0339  HGB 15.5  --   --  14.2  --  12.8* 9.4*  HCT 42.8  --   --  41.3  --  37.9* 27.0*  PLT 250  --   --  266  --  266 178  APTT  --  25  --   --   --   --   --   LABPROT  --  14.5  --   --   --   --   --   INR  --  1.11  --   --   --   --   --   HEPARINUNFRC  --   --   --   --  0.57  --  0.18*  CREATININE 0.94  --   --   --  1.01  --   --   TROPONINI 0.84*  --  0.94*  --  0.85*  --   --     Estimated Creatinine Clearance: 90.2 mL/min (by C-G formula based on Cr of 1.01).   Assessment: S/P right femoral embolectomy, heparin level is slightly low this AM, had to have evacuation of hematoma 6/26 so being more conservative  Goal of Therapy:  Heparin level 0.3-0.7 units/ml, low end for now with calf hematoma Monitor platelets by anticoagulation protocol: Yes   Plan:  -Inc heparin to 1050 units/hr -1200 HL  Abran Duke 02/23/2016,4:42 AM

## 2016-02-23 NOTE — Progress Notes (Signed)
Progress Note    Troy Yu  MVH:[id number] DOB: [birth year]  DOA: 02/21/2016 PCP: No primary care provider on file.    Brief Narrative:   Troy Yu is an 55 y.o. male who was in his usual state of health until about 2 weeks ago when he had substernal chest pain that lasted about 48 hours.The pain went away after he vomited, and he attributed his symptoms to a prolonged religious fast.  He then come to the [facility] ED on 02/21/16 with severe RLE pain associated with paresthesias.  Upon presentation, he was found to have an abnormal EKG and elevated troponin.  Cardiology was subsequently called to evaluate the patient, and he was felt to have suffered a subacute anterior MI.  STAT echo was ordered and EDP advised to order an arterial duplex with recommendation to consult vascular surgery.  IV heparin ordered and the patient was transferred to [facility] for further evaluation by vascular surgery, as it was felt that he had a right femoral embolism with concern for threatened limb.  Dr Imogene Burn subsequently performed a right femoral embolectomy and right calf compartment fasciotomies x 4 .  At 4:16 a.m. On 02/22/16, the patient complained of loss of sensation to RLE, and the patient reports he was in severe pain.  He was given pain medication which did not alleviate his symptoms. He was re-evaluated by Dr. Imogene Burn at 7:33 a.m. Who removed the ACE wrap from his RLE which relieved the pain, and the patient was felt to have excessive bleeding in his calf related to full anti-coagulation. He was taken back to surgery for evacuation of hematoma and VAC dressings. Cardiologist continues to follow for management of his subacute MI.  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  Wound VAC in place.  Case taken over by Dr. Arbie Cookey after patient requested a second opinion. Lipids well controlled, cholesterol 116, LDL 60. Likely for surgical revision later this week.  Active Problems:   Anterior subendocardial MI (HCC)/Abnormal EKG/Elevated troponin On therapeutic dose heparin.  Inderal started. ACE-I to be started when stable.  Will eventually need cardiac cath.    Newly diagnosed diabetes mellitus/Hyperglycemia Follow-up hemoglobin A1c.  Currently on moderate scale SSI/meal coverage and 10 units of Lantus. CBGs better controlled with insulin adjustment. Will ask DM coordinator to see.    Hyponatremia Likely from hyperglycemia.  Correct blood glucose and monitor.   Family Communication/Anticipated D/C date and plan/Code Status   DVT prophylaxis: On IV heparin. Code Status: Full Code.  Family Communication: Several family updated at bedside6/26/17. Disposition Plan: Home when stable, likely will be in hospital for several more days.   Medical Consultants:    Cardiology  Vascular surgery   Procedures:   02/21/16 PROCEDURE: 1. Right femoral embolectomy 2. Right calf four compartment fasciotomies  02/22/16 PROCEDURE: 3. Evacuation of hematoma from bilateral right calf fasciotomies 4. Placement of negative pressure dressings x 2   Anti-Infectives:   Anti-infectives    Start     Dose/Rate Route Frequency Ordered Stop   02/22/16 0800  cefUROXime (ZINACEF) 1.5 g in dextrose 5 % 50 mL IVPB     1.5 g 100 mL/hr over 30 Minutes Intravenous Every 12 hours  02/21/16 2115 02/22/16 2140   02/22/16 0600  cefUROXime (ZINACEF) injection 1.5 g     1.5 g Intramuscular On call to O.R. 02/21/16 1523 02/21/16 1713      Subjective:   Troy Yu appears comfortable today. He continues to be pain medications to control his leg pain, but reports it is much better. Has not moved his  bowels. Did get out of bed and had some in his leg with movement. No shortness of breath or chest pain.  Objective:    Filed Vitals:   02/23/16 0200 02/23/16 0400 02/23/16 0443 02/23/16 0600  BP: 91/56 107/63  88/60  Pulse:      Temp:  98.5 F (36.9 C)    TempSrc:  Oral    Resp: Height:      Weight:   93.7 kg (206 lb 9.1 oz)   SpO2: 96% 100%  99%    Intake/Output Summary (Last 24 hours) at 02/23/16 0720 Last data filed at 02/23/16 0700  Gross per 24 hour  Intake 3149.76 ml  Output   2880 ml  Net 269.76 ml   Filed Weights   02/21/16 2300 02/22/16 0400 02/23/16 0443  Weight: 91.1 kg (200 lb 13.4 oz) 92.7 kg (204 lb 5.9 oz) 93.7 kg (206 lb 9.1 oz)    Exam: General exam: Visibly upset, but otherwise no physical distress. Respiratory system: Clear to auscultation. Respiratory effort normal. Cardiovascular system: S1 & S2 heard, RRR. No JVD,  rubs, gallops or clicks. No murmurs. Gastrointestinal system: Abdomen is nondistended, soft and nontender. No organomegaly or masses felt. Normal bowel sounds heard. Central nervous system: Alert and oriented. No focal neurological deficits. Extremities: RLE with wound VAC in place, 1+ pulse, able to move toes. Skin: No rashes, lesions or ulcers. Psychiatry: Judgement and insight appear normal. Mood & affect anxious/distressed.   Data Reviewed:   I have personally reviewed following labs and imaging studies:  Labs: Basic Metabolic Panel:  Recent Labs Lab 02/21/16 0827 02/22/16 0400 02/23/16 0339  NA 132* 130* 132*  K 4.5 4.4 3.5  CL 100* 101 102  CO2 GLUCOSE 216* 303* 275*  BUN CREATININE 0.94 1.01 0.97  CALCIUM 8.6* 7.9* 7.5*   GFR Estimated Creatinine Clearance: 94.4 mL/min (by C-G formula based on Cr of 0.97). Liver Function Tests:  Recent Labs Lab 02/23/16 0339  AST 15  ALT 17  ALKPHOS 35*  BILITOT 0.5  PROT 4.6*  ALBUMIN 2.1*   No results for input(s): LIPASE, AMYLASE in the  last 168 hours. No results for input(s): AMMONIA in the last 168 hours. Coagulation profile  Recent Labs Lab 02/21/16 1342  INR 1.11    CBC:  Recent Labs Lab 02/21/16 0827 02/21/16 2300 02/22/16 0500 02/23/16 0339  WBC 10.1 11.3* 12.6* 7.8  NEUTROABS 7.9*  --   --   --   HGB 15.5 14.2 12.8* 9.4*  HCT 42.8 41.3 37.9* 27.0*  MCV 82.6 84.1 84.4 83.9  PLT 250 266 266 178   Cardiac Enzymes:  Recent Labs Lab 02/21/16 0827 02/21/16 2026 02/22/16 0400  TROPONINI 0.84* 0.94* 0.85*   BNP (last 3 results) No results for input(s): PROBNP in the last 8760 hours. CBG:  Recent Labs Lab 02/21/16 2257 02/22/16 0822 02/22/16 1232 02/22/16 1833 02/22/16 2126  GLUCAP 161* 309* 230* 126* 105*   D-Dimer: No results for input(s): DDIMER in the last 72 hours. Hgb A1c: No results for  input(s): HGBA1C in the last 72 hours. Lipid Profile:  Recent Labs  02/23/16 0339  CHOL 116  HDL 30*  LDLCALC 60  TRIG 161  CHOLHDL 3.9   Thyroid function studies: No results for input(s): TSH, T4TOTAL, T3FREE, THYROIDAB in the last 72 hours.  Invalid input(s): FREET3 Anemia work up: No results for input(s): VITAMINB12, FOLATE, FERRITIN, TIBC, IRON, RETICCTPCT in the last 72 hours. Sepsis Labs:  Recent Labs Lab 02/21/16 0827 02/21/16 2300 02/22/16 0500 02/23/16 0339  WBC 10.1 11.3* 12.6* 7.8   Urine analysis: No results found for: COLORURINE, APPEARANCEUR, LABSPEC, PHURINE, GLUCOSEU, HGBUR, BILIRUBINUR, KETONESUR, PROTEINUR, UROBILINOGEN, NITRITE, LEUKOCYTESUR Microbiology Recent Results (from the past 240 hour(s))  MRSA PCR Screening     Status: None   Collection Time: 02/21/16 10:17 PM  Result Value Ref Range Status   MRSA by PCR NEGATIVE NEGATIVE Final    Comment:        The GeneXpert MRSA Assay (FDA approved for NASAL specimens only), is one component of a comprehensive MRSA colonization surveillance program. It is not intended to diagnose MRSA infection nor to  guide or monitor treatment for MRSA infections.     Radiology: Dg Chest 2 View  02/21/2016  CLINICAL DATA:  Left lower extremity numbness and pain. EXAM: CHEST  2 VIEW COMPARISON:  None. FINDINGS: Normal heart size. Low lung volumes. Top-normal heart size. Mediastinal contour is within normal limits. No pneumothorax. No pleural effusion. Lungs appear clear, with no acute consolidative airspace disease and no pulmonary edema. IMPRESSION: Low lung volumes.  No active disease in the chest. Electronically Signed   By: Delbert Phenix M.D.   On: 02/21/2016 11:27   Ct Angio Ao+bifem W &/or Wo Contrast  02/21/2016  CLINICAL DATA:  Right lower extremity pain with diminished pulses and numbness. EXAM: CT ANGIOGRAPHY OF ABDOMINAL AORTA WITH ILIOFEMORAL RUNOFF TECHNIQUE: Multidetector CT imaging of the abdomen, pelvis and lower extremities was performed using the standard protocol during bolus administration of intravenous contrast. Multiplanar CT image reconstructions and MIPs were obtained to evaluate the vascular anatomy. CONTRAST:  100 mL Isovue 370 IV COMPARISON:  None. FINDINGS: Aorta: Normally patent abdominal aorta without evidence of atherosclerosis, aneurysm or dissection. Visceral arteries are widely patent including the celiac axis, superior mesenteric artery, inferior mesenteric artery and bilateral single renal arteries. No embolic occlusions identified in the abdomen or pelvis. Right Lower Extremity: Widely patent right common, external and internal iliac arteries. The common femoral artery is normally patent up to its bifurcation. At the femoral bifurcation, there is nearly occlusive thrombus identified extending into both proximal profunda femoral and superficial femoral artery trunks. Contrast flow is seen beyond the thrombus and into normal distal profunda femoral branches. The SFA is open, but demonstrates some anterior mural thrombus. In the absence of significant atherosclerosis, this may  represent some adherent thrombus from recent/prior embolic event. Anterior mural thrombus continues into the popliteal artery above the knee. Below the knee, there is only faint opacification of the distal popliteal artery and no visualized opacification of the tibial arteries. This is likely due to slow flow. However, component of thrombus in the tibial arteries cannot be entirely excluded. Left Lower Extremity: Left-sided arterial supply is completely normal including the common femoral artery, profunda femoral artery, superficial femoral artery, popliteal artery and tibial arteries. Or there is some narrowing of the popliteal artery just above the knee joint of approximately 40-50% is associated with a slightly tortuous/ course. No plaque is identified at this level. A  component of incidental popliteal entrapment cannot be excluded. Review of the MIP images confirms the above findings. Lower chest:  Bibasilar atelectasis.  No pleural effusions. Hepatobiliary: Unremarkable arterial phase imaging of the liver and gallbladder. Pancreas: Unremarkable appearance. Spleen: Normal appearance of spleen. Adrenals/Urinary Tract: Normal adrenal glands and kidneys. No renal infarcts identified. Stomach/Bowel: Normal appearance without evidence to suggest mesenteric ischemia or inflammation. No obstruction. Normal appendix. No free air, free fluid or abscess. Lymphatic: No enlarged lymph nodes identified. Reproductive: Negative. Other: No hernias. Musculoskeletal: Bony structures are normal. IMPRESSION: 1. Embolic thrombus at the bifurcation of the right common femoral artery with thrombus extending into proximal profunda femoral and superficial femoral arteries. Thrombus is nearly occlusive but contrast is seen to flow around the thrombus into the distal vessels. There is also a component of probable anterior mural thrombus in the SFA and popliteal arteries which may be consistent with additional adherent thrombus. Tibial  arteries are not adequately opacified on the right, most likely secondary to slow flow. Distal thrombus cannot be excluded. Vascular Surgical consultation is recommended. 2. Incidental finding of a mildly tortuous course of the popliteal artery on the left associated with some narrowing just above the knee joint. This narrowing does not appear atherosclerotic and there may be an incidental component of popliteal entrapment syndrome on the left. These results were called by telephone at the time of interpretation on 02/21/2016 at 2:55 pm to Dr. Zadie Rhine , who verbally acknowledged these results. Electronically Signed   By: Irish Lack M.D.   On: 02/21/2016 14:56    Medications:   . aspirin  325 mg Oral Daily  . docusate sodium  100 mg Oral Daily  . insulin aspart  0-15 Units Subcutaneous TID WC  . insulin aspart  0-5 Units Subcutaneous QHS  . insulin aspart  4 Units Subcutaneous TID WC  . insulin glargine  10 Units Subcutaneous QHS  . pantoprazole  40 mg Oral Daily  . propranolol  10 mg Oral TID   Continuous Infusions: . sodium chloride 75 mL/hr at 02/23/16 0000  . heparin 1,050 Units/hr (02/23/16 0445)  . lactated ringers Stopped (02/22/16 1900)    Time spent: 25 minutes.    LOS: 2 days   Paras Kreider  [facility] Pager [phone]. If unable to reach me by pager, please call my cell phone at [phone].  *Please refer to [email or web address], password [email or web address] to get updated schedule on who will round on this patient, as hospitalists switch teams weekly. If 7PM-7AM, please contact night-coverage at [email or web address], password [email or web address] for any overnight needs.  02/23/2016, 7:20 AM

## 2016-02-23 NOTE — Progress Notes (Signed)
Patient Name: Troy Yu Date of Encounter: 02/23/2016  Primary Cardiologist: Dr. Eden Emms   Principal Problem:   LV (left ventricular) [facility] Active Problems:   Anterior subendocardial MI [facility])   Right leg pain   Non-STEMI (non-ST elevated myocardial infarction) (HCC)   Elevated troponin   Abnormal EKG   Diabetes (HCC)   Acute MI (HCC)   History of embolectomy   Pain    SUBJECTIVE  Denies any CP or SOB. Last episode of CP was 2 weeks ago.   CURRENT MEDS . aspirin  325 mg Oral Daily  . docusate sodium  100 mg Oral Daily  . insulin aspart  0-15 Units Subcutaneous TID WC  . insulin aspart  0-5 Units Subcutaneous QHS  . insulin aspart  4 Units Subcutaneous TID WC  . insulin glargine  10 Units Subcutaneous QHS  . pantoprazole  40 mg Oral Daily  . propranolol  10 mg Oral TID    OBJECTIVE  Filed Vitals:   02/23/16 0400 02/23/16 0443 02/23/16 0600 02/23/16 0734  BP: 107/63  88/60   Pulse:    82  Temp: 98.5 F (36.9 C)   98.3 F (36.8 C)  TempSrc: Oral   Oral  Resp: 13  15   Height:      Weight:  93.7 kg (206 lb 9.1 oz)    SpO2: 100%  99%     Intake/Output Summary (Last 24 hours) at 02/23/16 0812 Last data filed at 02/23/16 0800  Gross per 24 hour  Intake 3235.26 ml  Output   3290 ml  Net -54.74 ml   Filed Weights   02/21/16 2300 02/22/16 0400 02/23/16 0443  Weight: 91.1 kg (200 lb 13.4 oz) 92.7 kg (204 lb 5.9 oz) 93.7 kg (206 lb 9.1 oz)    PHYSICAL EXAM  General: Pleasant, NAD. Neuro: Alert and oriented X 3. Moves all extremities spontaneously. Psych: Normal affect. HEENT:  Normal  Neck: Supple without bruits or JVD. Lungs:  Resp regular and unlabored, CTA. Heart: RRR no s3, s4, or murmurs. Abdomen: Soft, non-tender, non-distended, BS + x 4.  Extremities: No clubbing, cyanosis. Wound Vac in place, draining serosanguinous fluid. Great DP pulse in RLE  Accessory Clinical Findings  CBC  Recent Labs  02/21/16 0827  02/22/16 0500  02/23/16 0339  WBC 10.1  < > 12.6* 7.8  NEUTROABS 7.9*  --   --   --   HGB 15.5  < > 12.8* 9.4*  HCT 42.8  < > 37.9* 27.0*  MCV 82.6  < > 84.4 83.9  PLT 250  < > 266 178  < > = values in this interval not displayed. Basic Metabolic Panel  Recent Labs  02/22/16 0400 02/23/16 0339  NA 130* 132*  K 4.4 3.5  CL 101 102  CO2 23 24  GLUCOSE 303* 275*  BUN 12 10  CREATININE 1.01 0.97  CALCIUM 7.9* 7.5*   Liver Function Tests  Recent Labs  02/23/16 0339  AST 15  ALT 17  ALKPHOS 35*  BILITOT 0.5  PROT 4.6*  ALBUMIN 2.1*   Cardiac Enzymes  Recent Labs  02/21/16 0827 02/21/16 2026 02/22/16 0400  TROPONINI 0.84* 0.94* 0.85*   Fasting Lipid Panel  Recent Labs  02/23/16 0339  CHOL 116  HDL 30*  LDLCALC 60  TRIG 161  CHOLHDL 3.9    TELE NSR without ventricular ectopy    ECG  No new EKG  Echocardiogram 02/21/2016  LV EF: 30% -  35%  ------------------------------------------------------------------- Indications: Chest pain 786.51.  ------------------------------------------------------------------- History: PMH: Right Leg Pain, Chest pain 2 weeks ago. Angina pectoris. PMH: Myocardial infarction.  ------------------------------------------------------------------- Study Conclusions  - Left ventricle: Anteroapical infarct with large thrombus burden  at apex. Mobile and high embolic potential. The cavity size was  severely dilated. Wall thickness was normal. Systolic function  was moderately to severely reduced. The estimated ejection  fraction was in the range of 30% to 35%. - Atrial septum: No defect or patent foramen ovale was identified.    Radiology/Studies  Dg Chest 2 View  02/21/2016  CLINICAL DATA:  Left lower extremity numbness and pain. EXAM: CHEST  2 VIEW COMPARISON:  None. FINDINGS: Normal heart size. Low lung volumes. Top-normal heart size. Mediastinal contour is within normal limits. No pneumothorax. No pleural  effusion. Lungs appear clear, with no acute consolidative airspace disease and no pulmonary edema. IMPRESSION: Low lung volumes.  No active disease in the chest. Electronically Signed   By: Delbert Phenix M.D.   On: 02/21/2016 11:27   Ct Angio Ao+bifem W &/or Wo Contrast  02/21/2016  CLINICAL DATA:  Right lower extremity pain with diminished pulses and numbness. EXAM: CT ANGIOGRAPHY OF ABDOMINAL AORTA WITH ILIOFEMORAL RUNOFF TECHNIQUE: Multidetector CT imaging of the abdomen, pelvis and lower extremities was performed using the standard protocol during bolus administration of intravenous contrast. Multiplanar CT image reconstructions and MIPs were obtained to evaluate the vascular anatomy. CONTRAST:  100 mL Isovue 370 IV COMPARISON:  None. FINDINGS: Aorta: Normally patent abdominal aorta without evidence of atherosclerosis, aneurysm or dissection. Visceral arteries are widely patent including the celiac axis, superior mesenteric artery, inferior mesenteric artery and bilateral single renal arteries. No embolic occlusions identified in the abdomen or pelvis. Right Lower Extremity: Widely patent right common, external and internal iliac arteries. The common femoral artery is normally patent up to its bifurcation. At the femoral bifurcation, there is nearly occlusive thrombus identified extending into both proximal profunda femoral and superficial femoral artery trunks. Contrast flow is seen beyond the thrombus and into normal distal profunda femoral branches. The SFA is open, but demonstrates some anterior mural thrombus. In the absence of significant atherosclerosis, this may represent some adherent thrombus from recent/prior embolic event. Anterior mural thrombus continues into the popliteal artery above the knee. Below the knee, there is only faint opacification of the distal popliteal artery and no visualized opacification of the tibial arteries. This is likely due to slow flow. However, component of thrombus in  the tibial arteries cannot be entirely excluded. Left Lower Extremity: Left-sided arterial supply is completely normal including the common femoral artery, profunda femoral artery, superficial femoral artery, popliteal artery and tibial arteries. Or there is some narrowing of the popliteal artery just above the knee joint of approximately 40-50% is associated with a slightly tortuous/ course. No plaque is identified at this level. A component of incidental popliteal entrapment cannot be excluded. Review of the MIP images confirms the above findings. Lower chest:  Bibasilar atelectasis.  No pleural effusions. Hepatobiliary: Unremarkable arterial phase imaging of the liver and gallbladder. Pancreas: Unremarkable appearance. Spleen: Normal appearance of spleen. Adrenals/Urinary Tract: Normal adrenal glands and kidneys. No renal infarcts identified. Stomach/Bowel: Normal appearance without evidence to suggest mesenteric ischemia or inflammation. No obstruction. Normal appendix. No free air, free fluid or abscess. Lymphatic: No enlarged lymph nodes identified. Reproductive: Negative. Other: No hernias. Musculoskeletal: Bony structures are normal. IMPRESSION: 1. Embolic thrombus at the bifurcation of the right common femoral artery with thrombus  extending into proximal profunda femoral and superficial femoral arteries. Thrombus is nearly occlusive but contrast is seen to flow around the thrombus into the distal vessels. There is also a component of probable anterior mural thrombus in the SFA and popliteal arteries which may be consistent with additional adherent thrombus. Tibial arteries are not adequately opacified on the right, most likely secondary to slow flow. Distal thrombus cannot be excluded. Vascular Surgical consultation is recommended. 2. Incidental finding of a mildly tortuous course of the popliteal artery on the left associated with some narrowing just above the knee joint. This narrowing does not appear  atherosclerotic and there may be an incidental component of popliteal entrapment syndrome on the left. These results were called by telephone at the time of interpretation on 02/21/2016 at 2:55 pm to Dr. Zadie Rhine , who verbally acknowledged these results. Electronically Signed   By: Irish Lack M.D.   On: 02/21/2016 14:56    ASSESSMENT AND PLAN  1. Subacute anterior MI: likely occurred 2 weeks prior to arrival  - Echo 02/21/2016 EF 30-35%, anteroapical infarct with large thrombus burden at apex, mobile and high embolic potential  - once RLE healed, plan for cath   2. Ischemic leg  - CTA of leg showed embolic thrombus at the bifurcation of R common femoral artery extending into proximal profunda femoral and superficial femoral arteries.  - 02/21/2016 emergent R leg embolectomy, R calf four compartment fasciotomies  - post procedure complicated by presence of hematoma  - 02/22/2016 s/p evacuation of hematoma from bilateral R calf fasciotomies, application of VAC  3. Newly diagnosed DM  4. Worsening postop anemia: hgb 14.2 --> 12.8 --> 9.4. Continue to monitor. Patient is on IV heparin and has wound vac that actively draining the RLE  5. Hypotension: transfuse PRBC for anemia if hgb < 8.0 and give IV fluid. Likely dry. Hold BB if SBP < 105  Signed, Azalee Course PA-C Pager: [phone]  I/O's ok no CHF on exam.  HR better on inderal No ACE due to low BP.  On heparin for now Will have another Operation Friday to try to close fasciotomy sights Discussed with Dr Arbie Cookey. Start coumadin over weekend.   Cath will be deferred for a few weeks until acute PV issues resolved and fasciotomy incisions closed/healing Better.   Charlton Haws

## 2016-02-23 NOTE — Evaluation (Signed)
Occupational Therapy Evaluation Patient Details Name: Jequan Shahin MRN: [id number] DOB: [birth year] Today's Date: 02/23/2016    History of Present Illness 55 y.o. male with no significant hx of cad/pvd/angina/claudication. 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 adm with complaints of red right leg pain; + NSTEMI subacute; EF 30-35%; +left ventricle thrombus; +Rt femoral embolism, 6/25 femoral embolectomy and four compartment calf fasciotomies; 6/26 Evacuation of hematoma from bilateral right calf fasciotomies with placement of negative pressure dressings x 2 with plan for partial wound closeure with VAC replacement on 02/26/16   Clinical Impression   This 55 yo male admitted and underwent above presents to acute OT with deficits below (see OT problem list) thus affecting his PLOF of independent with basic ADLs and IADLs. He will benefit from acute OT without need for follow up, but will need 24 hour S with prn A.    Follow Up Recommendations  No OT follow up;Supervision/Assistance - 24 hour    Equipment Recommendations  3 in 1 bedside comode       Precautions / Restrictions Precautions Precautions: Fall Precaution Comments: wound vac RLE Restrictions Weight Bearing Restrictions: No      Mobility Bed Mobility Overal bed mobility: Needs Assistance Bed Mobility: Sit to Supine     Sit to supine: Min assist   General bed mobility comments: for RLE with HOB flat and no rail  Transfers Overall transfer level: Needs assistance Equipment used: Rolling walker (2 wheeled) Transfers: Sit to/from [employer] Sit to Stand: Min assist;+2 safety/equipment Stand pivot transfers: Min assist;+2 safety/equipment       General transfer comment: pt holding Rt foot off floor and hopping (small hops) on LT    Balance Overall balance assessment: Needs  assistance Sitting-balance support: No upper extremity supported (LLE supported, pt does not want to put RLE on ground due to pain) Sitting balance-Leahy Scale: Fair     Standing balance support: Bilateral upper extremity supported;During functional activity Standing balance-Leahy Scale: Poor Standing balance comment: reliant on steadying A when in standing with RW                            ADL Overall ADL's : Needs assistance/impaired Eating/Feeding: Independent;Sitting   Grooming: Set up;Sitting   Upper Body Bathing: Set up;Sitting   Lower Body Bathing: Moderate assistance (min A sit<>stand)   Upper Body Dressing : Set up;Sitting   Lower Body Dressing: Maximal assistance (min A sit<>stand)   Toilet Transfer: Minimal assistance;+2 for safety/equipment;RW Toilet Transfer Details (indicate cue type and reason): recliner>hop to bed since he does not want to put weight on RLE due to painful Toileting- Clothing Manipulation and Hygiene: Moderate assistance (min A sit<>stand)                         Pertinent Vitals/Pain Pain Assessment: 0-10 Pain Score: 5  Pain Location: wound areas Pain Descriptors / Indicators: Stabbing Pain Intervention(s): Monitored during session;Repositioned;Patient requesting pain meds-RN notified;RN gave pain meds during session     Hand Dominance Right   Extremity/Trunk Assessment Upper Extremity Assessment Upper Extremity Assessment: Overall WFL for tasks assessed     Communication Communication Communication: No difficulties   Cognition Arousal/Alertness: Awake/alert Behavior During Therapy: WFL for tasks assessed/performed Overall Cognitive Status: Within Functional Limits for tasks assessed  Home Living Family/patient expects to be discharged to:: Private residence Living Arrangements: Children Available Help at Discharge: Family;Available 24 hours/day Type of Home:  Other(Comment) Home Access: Level entry;Other (comment) (curb from parking lot)     Home Layout: One level         Bathroom Toilet: Standard     Home Equipment: None          Prior Functioning/Environment Level of Independence: Independent             OT Diagnosis: Generalized weakness;Acute pain   OT Problem List: Impaired balance (sitting and/or standing);Pain;Decreased knowledge of use of DME or AE   OT Treatment/Interventions: Self-care/ADL training;Patient/family education;Balance training;DME and/or AE instruction;Therapeutic activities    OT Goals(Current goals can be found in the care plan section) Acute Rehab OT Goals Patient Stated Goal: be able to walk again OT Goal Formulation: With patient Time For Goal Achievement: 03/08/16 Potential to Achieve Goals: Good  OT Frequency: Min 2X/week              End of Session Equipment Utilized During Treatment: Gait belt;Rolling walker Nurse Communication: Patient requests pain meds  Activity Tolerance: Patient tolerated treatment well Patient left: in bed;with bed alarm set;with family/visitor present   Time: 1359-1434 OT Time Calculation (min): 35 min Charges:  OT General Charges $OT Visit: 1 Procedure OT Evaluation $OT Eval Moderate Complexity: 1 Procedure OT Treatments $Therapeutic Activity: 8-22 mins  Evette Georges [phone] 02/23/2016, 2:48 PM

## 2016-02-23 NOTE — Progress Notes (Signed)
JP drain removed at this time per MD order. Manual pressure held for 25 minutes. Level 1 bruising. No active bleeding at time of pressure dressing application. RN will continue to monitor site.

## 2016-02-23 NOTE — Progress Notes (Signed)
Subjective: Interval History: none.. Comfortable night. No chest pain. Still with some soreness in his  Objective: Vital signs in last 24 hours: Temp:  [97.6 F (36.4 C)-100.4 F (38 C)] 98.3 F (36.8 C) (06/27 0734) Pulse Rate:  [81-112] 82 (06/27 0734) Resp:  [13-29] 15 (06/27 0600) BP: (88-117)/(54-91) 88/60 mmHg (06/27 0600) SpO2:  [96 %-100 %] 99 % (06/27 0600) Arterial Line BP: (93-100)/(87-93) 93/88 mmHg (06/26 1245) Weight:  [206 lb 9.1 oz (93.7 kg)] 206 lb 9.1 oz (93.7 kg) (06/27 0443)  Intake/Output from previous day: 06/26 0701 - 06/27 0700 In: 3149.8 [I.V.:3149.8] Out: 2880 [Urine:2625; Drains:200; Blood:55] Intake/Output this shift: Total I/O In: 85.5 [I.V.:85.5] Out: 410 [Urine:400; Drains:10]  Groin without hematoma. Jackson-Pratt drain in place. VAC in place for fasciotomy sites area and does have sensation throughout his foot. Reports some tingling more so on the medial aspect. Able to move his toes without difficulty  Lab Results:  Recent Labs  02/22/16 0500 02/23/16 0339  WBC 12.6* 7.8  HGB 12.8* 9.4*  HCT 37.9* 27.0*  PLT 266 178   BMET  Recent Labs  02/22/16 0400 02/23/16 0339  NA 130* 132*  K 4.4 3.5  CL 101 102  CO2 23 24  GLUCOSE 303* 275*  BUN 12 10  CREATININE 1.01 0.97  CALCIUM 7.9* 7.5*    Studies/Results: Dg Chest 2 View  02/21/2016  CLINICAL DATA:  Left lower extremity numbness and pain. EXAM: CHEST  2 VIEW COMPARISON:  None. FINDINGS: Normal heart size. Low lung volumes. Top-normal heart size. Mediastinal contour is within normal limits. No pneumothorax. No pleural effusion. Lungs appear clear, with no acute consolidative airspace disease and no pulmonary edema. IMPRESSION: Low lung volumes.  No active disease in the chest. Electronically Signed   By: Delbert Phenix M.D.   On: 02/21/2016 11:27   Ct Angio Ao+bifem W &/or Wo Contrast  02/21/2016  CLINICAL DATA:  Right lower extremity pain with diminished pulses and numbness. EXAM:  CT ANGIOGRAPHY OF ABDOMINAL AORTA WITH ILIOFEMORAL RUNOFF TECHNIQUE: Multidetector CT imaging of the abdomen, pelvis and lower extremities was performed using the standard protocol during bolus administration of intravenous contrast. Multiplanar CT image reconstructions and MIPs were obtained to evaluate the vascular anatomy. CONTRAST:  100 mL Isovue 370 IV COMPARISON:  None. FINDINGS: Aorta: Normally patent abdominal aorta without evidence of atherosclerosis, aneurysm or dissection. Visceral arteries are widely patent including the celiac axis, superior mesenteric artery, inferior mesenteric artery and bilateral single renal arteries. No embolic occlusions identified in the abdomen or pelvis. Right Lower Extremity: Widely patent right common, external and internal iliac arteries. The common femoral artery is normally patent up to its bifurcation. At the femoral bifurcation, there is nearly occlusive thrombus identified extending into both proximal profunda femoral and superficial femoral artery trunks. Contrast flow is seen beyond the thrombus and into normal distal profunda femoral branches. The SFA is open, but demonstrates some anterior mural thrombus. In the absence of significant atherosclerosis, this may represent some adherent thrombus from recent/prior embolic event. Anterior mural thrombus continues into the popliteal artery above the knee. Below the knee, there is only faint opacification of the distal popliteal artery and no visualized opacification of the tibial arteries. This is likely due to slow flow. However, component of thrombus in the tibial arteries cannot be entirely excluded. Left Lower Extremity: Left-sided arterial supply is completely normal including the common femoral artery, profunda femoral artery, superficial femoral artery, popliteal artery and tibial arteries. Or there is  some narrowing of the popliteal artery just above the knee joint of approximately 40-50% is associated with a  slightly tortuous/ course. No plaque is identified at this level. A component of incidental popliteal entrapment cannot be excluded. Review of the MIP images confirms the above findings. Lower chest:  Bibasilar atelectasis.  No pleural effusions. Hepatobiliary: Unremarkable arterial phase imaging of the liver and gallbladder. Pancreas: Unremarkable appearance. Spleen: Normal appearance of spleen. Adrenals/Urinary Tract: Normal adrenal glands and kidneys. No renal infarcts identified. Stomach/Bowel: Normal appearance without evidence to suggest mesenteric ischemia or inflammation. No obstruction. Normal appendix. No free air, free fluid or abscess. Lymphatic: No enlarged lymph nodes identified. Reproductive: Negative. Other: No hernias. Musculoskeletal: Bony structures are normal. IMPRESSION: 1. Embolic thrombus at the bifurcation of the right common femoral artery with thrombus extending into proximal profunda femoral and superficial femoral arteries. Thrombus is nearly occlusive but contrast is seen to flow around the thrombus into the distal vessels. There is also a component of probable anterior mural thrombus in the SFA and popliteal arteries which may be consistent with additional adherent thrombus. Tibial arteries are not adequately opacified on the right, most likely secondary to slow flow. Distal thrombus cannot be excluded. Vascular Surgical consultation is recommended. 2. Incidental finding of a mildly tortuous course of the popliteal artery on the left associated with some narrowing just above the knee joint. This narrowing does not appear atherosclerotic and there may be an incidental component of popliteal entrapment syndrome on the left. These results were called by telephone at the time of interpretation on 02/21/2016 at 2:55 pm to Dr. Zadie Rhine , who verbally acknowledged these results. Electronically Signed   By: Irish Lack M.D.   On: 02/21/2016 14:56   Anti-infectives: Anti-infectives     Start     Dose/Rate Route Frequency Ordered Stop   02/22/16 0800  cefUROXime (ZINACEF) 1.5 g in dextrose 5 % 50 mL IVPB     1.5 g 100 mL/hr over 30 Minutes Intravenous Every 12 hours 02/21/16 2115 02/22/16 2140   02/22/16 0600  cefUROXime (ZINACEF) injection 1.5 g     1.5 g Intramuscular On call to O.R. 02/21/16 1523 02/21/16 1713      Assessment/Plan: s/p Procedure(s): EVACUATION HEMATOMA With Placement of Negative Pressure Dressing (Right) APPLICATION OF Negative Pressure Dressing Right Lower Leg (Right) Stable overall. 2-3+ dorsalis pedis pulse on the right. No evidence of bleeding. Continue heparin due to cardiac issues. We will begin to mobilize. Suggest PT evaluation if okay with cardiac service. Explained need for eventual closure of fasciotomy sites. Tentatively plan to return to the operating room on Friday, June 30 for partial closure and washout and replacement of VAC. Explained may eventually require skin grafting since unlikely to be able to completely close skin over fasciotomies. Patient and family present understand the plan that we have discussed this morning. Will DC Jackson-Pratt drain from right groin. Has put out 10 cc in the last shift   LOS: 2 days   Francene Mcerlean 02/23/2016, 8:17 AM

## 2016-02-23 NOTE — Progress Notes (Addendum)
ANTICOAGULATION CONSULT NOTE - Follow Up Consult  Pharmacy Consult for Heparin  Indication: Subacute MI/vascular occlusion  No Known Allergies  Patient Measurements: Height: 5\' 8"  (172.7 cm) Weight: 206 lb 9.1 oz (93.7 kg) IBW/kg (Calculated) : 68.4  Heparin dosing wt: 87kg  Vital Signs: Temp: 98.8 F (37.1 C) (06/27 2338) Temp Source: Oral (06/27 2338) BP: 95/58 mmHg (06/28 0000)  Labs:  Recent Labs  02/21/16 0827 02/21/16 1342 02/21/16 2026  02/22/16 0400  02/23/16 0339 02/23/16 1325 02/23/16 1349 02/23/16 2300 02/24/16 0320 02/24/16 0325  HGB 15.5  --   --   < >  --   < > 9.4* 8.8*  --   --  8.8*  --   HCT 42.8  --   --   < >  --   < > 27.0* 25.3*  --   --  26.4*  --   PLT 250  --   --   < >  --   < > 178 197  --   --  198  --   APTT  --  25  --   --   --   --   --   --   --   --   --   --   LABPROT  --  14.5  --   --   --   --   --   --   --   --   --   --   INR  --  1.11  --   --   --   --   --   --   --   --   --   --   HEPARINUNFRC  --   --   --   < > 0.57  --  0.18*  --  0.10* 0.36  --  0.29*  CREATININE 0.94  --   --   --  1.01  --  0.97  --   --   --   --   --   TROPONINI 0.84*  --  0.94*  --  0.85*  --   --   --   --   --   --   --   < > = values in this interval not displayed.  Estimated Creatinine Clearance: 94.4 mL/min (by C-G formula based on Cr of 0.97).   Assessment: 55 yo male with LV thrombus and R femoral embolism s/p embolectomy on 6/25 and now s/p hematoma evacuation. Heparin level low end of therapeutic on 1200 units/hr. No bleeding noted.  Goal of Therapy:  Heparin level 0.3-0.7 units/ml, low end for now with calf hematoma Monitor platelets by anticoagulation protocol: Yes   Plan:  -Continue heparin at 1200 units/hr  -Daily heparin level to confirm therapeutic  Christoper Fabian, PharmD, BCPS Clinical pharmacist, pager [phone]  02/24/2016 4:05 AM   Addendum 0405: Heparin level now down to 0.29 (slightly subtherapeutic) on 1200  units/hr. No issues with line or bleeding reported per RN.  Plan: Increase heparin gtt to 1350 units/hr F/u 6 hour heparin level  Christoper Fabian, PharmD, BCPS Clinical pharmacist, pager [phone] 02/24/2016 4:06 AM

## 2016-02-24 LAB — HEPARIN LEVEL (UNFRACTIONATED)
Heparin Unfractionated: 0.29 IU/mL — ABNORMAL LOW (ref 0.30–0.70)
Heparin Unfractionated: 0.48 IU/mL (ref 0.30–0.70)

## 2016-02-24 LAB — GLUCOSE, CAPILLARY
GLUCOSE-CAPILLARY: 191 mg/dL — AB (ref 65–99)
GLUCOSE-CAPILLARY: 173 mg/dL — AB (ref 65–99)
Glucose-Capillary: 181 mg/dL — ABNORMAL HIGH (ref 65–99)
Glucose-Capillary: 238 mg/dL — ABNORMAL HIGH (ref 65–99)

## 2016-02-24 LAB — CBC
HEMATOCRIT: 26.4 % — AB (ref 39.0–52.0)
Hemoglobin: 8.8 g/dL — ABNORMAL LOW (ref 13.0–17.0)
MCH: 28.1 pg (ref 26.0–34.0)
MCHC: 33.3 g/dL (ref 30.0–36.0)
MCV: 84.3 fL (ref 78.0–100.0)
Platelets: 198 10*3/uL (ref 150–400)
RBC: 3.13 MIL/uL — ABNORMAL LOW (ref 4.22–5.81)
RDW: 12.1 % (ref 11.5–15.5)
WBC: 9.3 10*3/uL (ref 4.0–10.5)

## 2016-02-24 LAB — HEMOGLOBIN A1C
HEMOGLOBIN A1C: 9.2 % — AB (ref 4.8–5.6)
MEAN PLASMA GLUCOSE: 217 mg/dL

## 2016-02-24 MED ORDER — HYDROMORPHONE HCL 1 MG/ML IJ SOLN
0.5000 mg | INTRAMUSCULAR | Status: DC | PRN
  Administered 2016-02-24 – 2016-02-27 (×8): 1 mg via INTRAVENOUS
  Administered 2016-02-27: 0.5 mg via INTRAVENOUS
  Administered 2016-02-27 – 2016-03-02 (×38): 1 mg via INTRAVENOUS
  Filled 2016-02-24 (×49): qty 1

## 2016-02-24 MED ORDER — LIVING WELL WITH DIABETES BOOK
Freq: Once | Status: AC
  Administered 2016-02-24: 11:00:00
  Filled 2016-02-24: qty 1

## 2016-02-24 MED ORDER — INSULIN STARTER KIT- PEN NEEDLES (ENGLISH)
1.0000 | Freq: Once | Status: AC
  Administered 2016-02-24: 1
  Filled 2016-02-24: qty 1

## 2016-02-24 NOTE — Progress Notes (Addendum)
PROGRESS NOTE    Troy Yu  IOE:[id number] DOB: [birth year] DOA: 02/21/2016 PCP: No primary care provider on file.   Brief Narrative:  Troy Yu is an 55 y.o. male who was in his usual state of health until about 2 weeks ago when he had substernal chest pain that lasted about 48 hours.The pain went away after he vomited, and he attributed his symptoms to a prolonged religious fast. He then come to the [facility] ED on 02/21/16 with severe RLE pain associated with paresthesias. Upon presentation, he was found to have an abnormal EKG and elevated troponin. Cardiology was subsequently called to evaluate the patient, and he was felt to have suffered a subacute anterior MI. STAT echo was ordered and EDP advised to order an arterial duplex with recommendation to consult vascular surgery. IV heparin ordered and the patient was transferred to [facility] for further evaluation by vascular surgery, as it was felt that he had a right femoral embolism with concern for threatened limb. Dr Troy Yu subsequently performed a right femoral embolectomy and right calf compartment fasciotomies x 4 . At 4:16 a.m. On 02/22/16, the patient complained of loss of sensation to RLE, and the patient reports he was in severe pain. He was given pain medication which did not alleviate his symptoms. He was re-evaluated by Dr. [name] at 7:33 a.m. Who removed the ACE wrap from his RLE which relieved the pain, and the patient was felt to have excessive bleeding in his calf related to full anti-coagulation. He was taken back to surgery for evacuation of hematoma and VAC dressings. Cardiologist continues to follow for management of his subacute MI.  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  -Wound VAC in place.  -Case taken over by Dr. [name] after patient requested a second opinion.  -Lipids well controlled, cholesterol 116, LDL 60.  -Likely for surgical revision later this week, 02/26/2016  Anterior subendocardial MI (HCC)/Abnormal EKG/Elevated troponin -On therapeutic dose heparin.  -Cardiology consulted and appreciated -Continue Inderal -ACE-I to be started when BP able to tolerate -Will eventually need cardiac cath  Newly diagnosed diabetes mellitus/Hyperglycemia -Hemoglobin A1c 9.2.  -Continue lantus, ISS and CBG monitoring.  -Diabetes coordinator consulted  Hyponatremia -Likely from hyperglycemia. Correct blood glucose and monitor. -Continue to monitor BMP  Postoperative anemia -Hemoglobin currently 8.8 -Currently on heparin -Continue to monitor CBC  DVT Prophylaxis  heparin  Code Status: Full  Family Communication: Son at bedside  Disposition Plan: Admitted. Pending surgery on Friday. Monitor hemoglobin  Consultants Cardiology  Vascular Surgery  Procedures  Right femoral embolectomy Right calf four compartment fasciotomies  Evacuation of hematoma from bilateral right calf fasciotomies Placement of negative pressure dressings x2  Antibiotics   Anti-infectives    Start     Dose/Rate Route Frequency Ordered Stop   02/22/16 0800  cefUROXime (ZINACEF) 1.5 g in dextrose 5 % 50 mL IVPB     1.5 g 100 mL/hr over 30 Minutes Intravenous Every 12 hours 02/21/16 2115 02/22/16 2140   02/22/16 0600  cefUROXime (ZINACEF) injection 1.5 g     1.5 g Intramuscular On call to O.R. 02/21/16 1523 02/21/16 1713      Subjective:   Troy Yu seen and examined today.  Patient states  his pain is controlled as he just took some pain medication.  Denies chest pain, shortness of breath, abdominal pain. Denies having a bowel movement recently.   Objective:   Filed Vitals:   02/24/16 0400  02/24/16 0745 02/24/16 0848 02/24/16 0924  BP: 102/59  91/75 104/63  Pulse:   88 83  Temp: 99.1 F (37.3 C)  98.5 F (36.9 C)   TempSrc: Oral  Oral   Resp: '16 22 20 21  ' Height:      Weight:      SpO2: 99% 100% 99% 100%    Intake/Output Summary (Last 24 hours) at 02/24/16 1122 Last data filed at 02/24/16 1000  Gross per 24 hour  Intake 2603.63 ml  Output   4825 ml  Net -2221.37 ml   Filed Weights   02/21/16 2300 02/22/16 0400 02/23/16 0443  Weight: 91.1 kg (200 lb 13.4 oz) 92.7 kg (204 lb 5.9 oz) 93.7 kg (206 lb 9.1 oz)    Exam  General: Well developed, well nourished, NAD  HEENT: NCAT, mucous membranes moist.   Cardiovascular: S1 S2 auscultated, no murmurs, RRR  Respiratory: Clear to auscultation bilaterally   Abdomen: Soft, nontender, nondistended, + bowel sounds  Extremities: warm dry without cyanosis clubbing or edema. RLE with wound vac in place.  Neuro: AAOx3, nonfocal  Psych: Normal affect and demeanor    Data Reviewed: I have personally reviewed following labs and imaging studies  CBC:  Recent Labs Lab 02/21/16 0827 02/21/16 2300 02/22/16 0500 02/23/16 0339 02/23/16 1325 02/24/16 0320  WBC 10.1 11.3* 12.6* 7.8 8.3 9.3  NEUTROABS 7.9*  --   --   --   --   --   HGB 15.5 14.2 12.8* 9.4* 8.8* 8.8*  HCT 42.8 41.3 37.9* 27.0* 25.3* 26.4*  MCV 82.6 84.1 84.4 83.9 84.6 84.3  PLT 250 266 266 178 197 275   Basic Metabolic Panel:  Recent Labs Lab 02/21/16 0827 02/22/16 0400 02/23/16 0339  NA 132* 130* 132*  K 4.5 4.4 3.5  CL 100* 101 102  CO2 '25 23 24  ' GLUCOSE 216* 303* 275*  BUN '14 12 10  ' CREATININE 0.94 1.01 0.97  CALCIUM 8.6* 7.9* 7.5*   GFR: Estimated Creatinine Clearance: 94.4 mL/min (by C-G formula based on Cr of 0.97). Liver Function Tests:  Recent Labs Lab 02/23/16 0339  AST 15  ALT 17  ALKPHOS 35*  BILITOT 0.5  PROT 4.6*  ALBUMIN 2.1*   No results for input(s): LIPASE, AMYLASE in the last 168 hours. No results for  input(s): AMMONIA in the last 168 hours. Coagulation Profile:  Recent Labs Lab 02/21/16 1342  INR 1.11   Cardiac Enzymes:  Recent Labs Lab 02/21/16 0827 02/21/16 2026 02/22/16 0400  TROPONINI 0.84* 0.94* 0.85*   BNP (last 3 results) No results for input(s): PROBNP in the last 8760 hours. HbA1C:  Recent Labs  02/23/16 0339  HGBA1C 9.2*   CBG:  Recent Labs Lab 02/23/16 0736 02/23/16 1147 02/23/16 1716 02/23/16 2135 02/24/16 0846  GLUCAP 208* 210* 223* 168* 181*   Lipid Profile:  Recent Labs  02/23/16 0339  CHOL 116  HDL 30*  LDLCALC 60  TRIG 130  CHOLHDL 3.9   Thyroid Function Tests: No results for input(s): TSH, T4TOTAL, FREET4, T3FREE, THYROIDAB in the last 72 hours. Anemia Panel: No results for input(s): VITAMINB12, FOLATE, FERRITIN, TIBC, IRON, RETICCTPCT in the last 72 hours. Urine analysis: No results found for: COLORURINE, APPEARANCEUR, [place], [place], [place], [place], [place], [place], [place],  UROBILINOGEN, NITRITE, LEUKOCYTESUR Sepsis Labs: '@[email or web address]' (procalcitonin:4,lacticidven:4)  ) Recent Results (from the past 240 hour(s))  MRSA PCR Screening     Status: None   Collection Time: 02/21/16 10:17 PM  Result Value Ref Range Status   MRSA by PCR NEGATIVE NEGATIVE Final    Comment:        The GeneXpert MRSA Assay (FDA approved for NASAL specimens only), is one component of a comprehensive MRSA colonization surveillance program. It is not intended to diagnose MRSA infection nor to guide or monitor treatment for MRSA infections.       Radiology Studies: No results found.   Scheduled Meds: . aspirin  325 mg Oral Daily  . atorvastatin  80 mg Oral q1800  . docusate sodium  100 mg Oral Daily  . insulin aspart  0-15 Units Subcutaneous TID WC  . insulin aspart  0-5 Units Subcutaneous QHS  . insulin aspart  4 Units Subcutaneous TID WC  . insulin glargine  10 Units Subcutaneous QHS  . insulin starter kit- pen needles  1 kit  Other Once  . living well with diabetes book   Does not apply Once  . pantoprazole  40 mg Oral Daily  . propranolol  10 mg Oral TID   Continuous Infusions: . sodium chloride 75 mL/hr at 02/23/16 2350  . heparin 1,350 Units/hr (02/24/16 0415)     LOS: 3 days   Time Spent in minutes   30 minutes  Troy Yu D.O. on 02/24/2016 at 11:22 AM  Between 7am to 7pm - Pager - [phone]  After 7pm go to [email or web address] - password [email or web address]  And look for the night coverage person covering for me after hours  [facility] Office  [phone]

## 2016-02-24 NOTE — Progress Notes (Signed)
Patient ID: Troy Yu, male   DOB: [birth year], 55 y.o.   MRN: [id number]   Patient Name: Troy Yu Date of Encounter: 02/24/2016  Primary Cardiologist: Dr. Eden Emms   Principal Problem:   LV (left ventricular) mural thrombus [facility]) Active Problems:   Anterior subendocardial MI [facility])   Right leg pain   Non-STEMI (non-ST elevated myocardial infarction) (HCC)   Elevated troponin   Abnormal EKG   Diabetes (HCC)   Acute MI (HCC)   History of embolectomy   Pain   Thromboembolism (HCC)   Vascular occlusion    SUBJECTIVE  Better spirits some bleeding/ drainage from leg ? To OR to "clean" No chest pain or dyspnea   CURRENT MEDS . aspirin  325 mg Oral Daily  . atorvastatin  80 mg Oral q1800  . docusate sodium  100 mg Oral Daily  . insulin aspart  0-15 Units Subcutaneous TID WC  . insulin aspart  0-5 Units Subcutaneous QHS  . insulin aspart  4 Units Subcutaneous TID WC  . insulin glargine  10 Units Subcutaneous QHS  . pantoprazole  40 mg Oral Daily  . propranolol  10 mg Oral TID    OBJECTIVE  Filed Vitals:   02/24/16 0000 02/24/16 0400 02/24/16 0745 02/24/16 0848  BP: 95/58 102/59  91/75  Pulse:    88  Temp:  99.1 F (37.3 C)  98.5 F (36.9 C)  TempSrc:  Oral  Oral  Resp: 20 16 22 20   Height:      Weight:      SpO2: 99% 99% 100% 99%    Intake/Output Summary (Last 24 hours) at 02/24/16 0905 Last data filed at 02/24/16 0800  Gross per 24 hour  Intake 2837.63 ml  Output   4825 ml  Net -1987.37 ml   Filed Weights   02/21/16 2300 02/22/16 0400 02/23/16 0443  Weight: 200 lb 13.4 oz (91.1 kg) 204 lb 5.9 oz (92.7 kg) 206 lb 9.1 oz (93.7 kg)    PHYSICAL EXAM  General: Pleasant, NAD. Neuro: Alert and oriented X 3. Moves all extremities spontaneously. Psych: Normal affect. HEENT:  Normal  Neck: Supple without bruits or JVD. Lungs:  Resp regular and unlabored, CTA. Heart: RRR no s3, s4, or murmurs. Abdomen: Soft, non-tender, non-distended, BS + x 4.    Extremities: No clubbing, cyanosis. Wound Vac in place, draining serosanguinous fluid. Great DP pulse in RLE  Accessory Clinical Findings  CBC  Recent Labs  02/23/16 1325 02/24/16 0320  WBC 8.3 9.3  HGB 8.8* 8.8*  HCT 25.3* 26.4*  MCV 84.6 84.3  PLT 197 198   Basic Metabolic Panel  Recent Labs  02/22/16 0400 02/23/16 0339  NA 130* 132*  K 4.4 3.5  CL 101 102  CO2 23 24  GLUCOSE 303* 275*  BUN 12 10  CREATININE 1.01 0.97  CALCIUM 7.9* 7.5*   Liver Function Tests  Recent Labs  02/23/16 0339  AST 15  ALT 17  ALKPHOS 35*  BILITOT 0.5  PROT 4.6*  ALBUMIN 2.1*   Cardiac Enzymes  Recent Labs  02/21/16 2026 02/22/16 0400  TROPONINI 0.94* 0.85*   Fasting Lipid Panel  Recent Labs  02/23/16 0339  CHOL 116  HDL 30*  LDLCALC 60  TRIG 831  CHOLHDL 3.9    TELE NSR without ventricular ectopy    ECG  No new EKG  Echocardiogram 02/21/2016  LV EF: 30% - 35%  ------------------------------------------------------------------- Indications: Chest pain 786.51.  ------------------------------------------------------------------- History: PMH: Right Leg Pain,  Chest pain 2 weeks ago. Angina pectoris. PMH: Myocardial infarction.  ------------------------------------------------------------------- Study Conclusions  - Left ventricle: Anteroapical infarct with large thrombus burden  at apex. Mobile and high embolic potential. The cavity size was  severely dilated. Wall thickness was normal. Systolic function  was moderately to severely reduced. The estimated ejection  fraction was in the range of 30% to 35%. - Atrial septum: No defect or patent foramen ovale was identified.    Radiology/Studies  Dg Chest 2 View  02/21/2016  CLINICAL DATA:  Left lower extremity numbness and pain. EXAM: CHEST  2 VIEW COMPARISON:  None. FINDINGS: Normal heart size. Low lung volumes. Top-normal heart size. Mediastinal contour is within normal  limits. No pneumothorax. No pleural effusion. Lungs appear clear, with no acute consolidative airspace disease and no pulmonary edema. IMPRESSION: Low lung volumes.  No active disease in the chest. Electronically Signed   By: Delbert Phenix M.D.   On: 02/21/2016 11:27   Ct Angio Ao+bifem W &/or Wo Contrast  02/21/2016  CLINICAL DATA:  Right lower extremity pain with diminished pulses and numbness. EXAM: CT ANGIOGRAPHY OF ABDOMINAL AORTA WITH ILIOFEMORAL RUNOFF TECHNIQUE: Multidetector CT imaging of the abdomen, pelvis and lower extremities was performed using the standard protocol during bolus administration of intravenous contrast. Multiplanar CT image reconstructions and MIPs were obtained to evaluate the vascular anatomy. CONTRAST:  100 mL Isovue 370 IV COMPARISON:  None. FINDINGS: Aorta: Normally patent abdominal aorta without evidence of atherosclerosis, aneurysm or dissection. Visceral arteries are widely patent including the celiac axis, superior mesenteric artery, inferior mesenteric artery and bilateral single renal arteries. No embolic occlusions identified in the abdomen or pelvis. Right Lower Extremity: Widely patent right common, external and internal iliac arteries. The common femoral artery is normally patent up to its bifurcation. At the femoral bifurcation, there is nearly occlusive thrombus identified extending into both proximal profunda femoral and superficial femoral artery trunks. Contrast flow is seen beyond the thrombus and into normal distal profunda femoral branches. The SFA is open, but demonstrates some anterior mural thrombus. In the absence of significant atherosclerosis, this may represent some adherent thrombus from recent/prior embolic event. Anterior mural thrombus continues into the popliteal artery above the knee. Below the knee, there is only faint opacification of the distal popliteal artery and no visualized opacification of the tibial arteries. This is likely due to slow  flow. However, component of thrombus in the tibial arteries cannot be entirely excluded. Left Lower Extremity: Left-sided arterial supply is completely normal including the common femoral artery, profunda femoral artery, superficial femoral artery, popliteal artery and tibial arteries. Or there is some narrowing of the popliteal artery just above the knee joint of approximately 40-50% is associated with a slightly tortuous/ course. No plaque is identified at this level. A component of incidental popliteal entrapment cannot be excluded. Review of the MIP images confirms the above findings. Lower chest:  Bibasilar atelectasis.  No pleural effusions. Hepatobiliary: Unremarkable arterial phase imaging of the liver and gallbladder. Pancreas: Unremarkable appearance. Spleen: Normal appearance of spleen. Adrenals/Urinary Tract: Normal adrenal glands and kidneys. No renal infarcts identified. Stomach/Bowel: Normal appearance without evidence to suggest mesenteric ischemia or inflammation. No obstruction. Normal appendix. No free air, free fluid or abscess. Lymphatic: No enlarged lymph nodes identified. Reproductive: Negative. Other: No hernias. Musculoskeletal: Bony structures are normal. IMPRESSION: 1. Embolic thrombus at the bifurcation of the right common femoral artery with thrombus extending into proximal profunda femoral and superficial femoral arteries. Thrombus is nearly occlusive but  contrast is seen to flow around the thrombus into the distal vessels. There is also a component of probable anterior mural thrombus in the SFA and popliteal arteries which may be consistent with additional adherent thrombus. Tibial arteries are not adequately opacified on the right, most likely secondary to slow flow. Distal thrombus cannot be excluded. Vascular Surgical consultation is recommended. 2. Incidental finding of a mildly tortuous course of the popliteal artery on the left associated with some narrowing just above the knee  joint. This narrowing does not appear atherosclerotic and there may be an incidental component of popliteal entrapment syndrome on the left. These results were called by telephone at the time of interpretation on 02/21/2016 at 2:55 pm to Dr. Zadie Rhine , who verbally acknowledged these results. Electronically Signed   By: Irish Lack M.D.   On: 02/21/2016 14:56    ASSESSMENT AND PLAN  1. Subacute anterior MI: likely occurred 2 weeks prior to arrival  - Echo 02/21/2016 EF 30-35%, anteroapical infarct with large thrombus burden at apex, mobile and high embolic potential  - once RLE healed will need further coronary evaluation and risk stratification for AICD.  Add ACE before d/c when BP allows  - good response to beta blocker HR in 80's now   2. Ischemic leg  - CTA of leg showed embolic thrombus at the bifurcation of R common femoral artery extending into proximal profunda femoral and superficial femoral arteries.  - 02/21/2016 emergent R leg embolectomy, R calf four compartment fasciotomies  - post procedure complicated by presence of hematoma  - 02/22/2016 s/p evacuation of hematoma from bilateral R calf fasciotomies, application of VAC  - Dr Early to debride today and ? Partially close fasciotomy on Friday   3. Newly diagnosed DM  4. Worsening postop anemia: hgb 14.2 --> 12.8 --> 9.4. Continue to monitor. Patient is on IV heparin and has wound vac that actively draining the RLE  5. Hypotension: transfuse PRBC for anemia if hgb < 8.0 and give IV fluid. Likely dry. Hold BB if SBP < 105  Charlton Haws

## 2016-02-24 NOTE — Progress Notes (Addendum)
  [facility] Progress Note  Subjective   Right foot feels ok. Needs to have BM.   Objective Filed Vitals:   02/24/16 0000 02/24/16 0400  BP: 95/58 102/59  Pulse:    Temp:  99.1 F (37.3 C)  Resp: 20 16    Intake/Output Summary (Last 24 hours) at 02/24/16 0826 Last data filed at 02/24/16 0745  Gross per 24 hour  Intake 2639.63 ml  Output   4825 ml  Net -2185.37 ml   Right leg medial VAC seal intact. Right lateral with minor oozing distally.  Right foot 3+ DP pulse. Motor and sensory function intact. Left foot 3+ DP pulse.   Assessment/Planning: 55 y.o. male is s/p: right femoral embolectomy, evacuation hematoma from bilateral right calf fasciotomies, placement of negative pressure dressings 2 Days Post-Op   Right foot well perfused. Continue heparin.  For VAC change today. Please premedicate patient.  Plan to return to OR Friday for possible closure.   Raymond Gurney 02/24/2016 8:26 AM  Addendum Was at bedside for [facility] change with WOC RN. Medial and lateral fasciotomy sites are clean with viable muscle.   Maris Berger, PA-C  Laboratory CBC    Component Value Date/Time   WBC 9.3 02/24/2016 0320   HGB 8.8* 02/24/2016 0320   HCT 26.4* 02/24/2016 0320   PLT 198 02/24/2016 0320    BMET    Component Value Date/Time   NA 132* 02/23/2016 0339   K 3.5 02/23/2016 0339   CL 102 02/23/2016 0339   CO2 24 02/23/2016 0339   GLUCOSE 275* 02/23/2016 0339   BUN 10 02/23/2016 0339   CREATININE 0.97 02/23/2016 0339   CALCIUM 7.5* 02/23/2016 0339   GFRNONAA >60 02/23/2016 0339   GFRAA >60 02/23/2016 0339    COAG Lab Results  Component Value Date   INR 1.11 02/21/2016   No results found for: PTT  Antibiotics Anti-infectives    Start     Dose/Rate Route Frequency Ordered Stop   02/22/16 0800  cefUROXime (ZINACEF) 1.5 g in dextrose 5 % 50 mL IVPB     1.5 g 100 mL/hr over 30 Minutes Intravenous Every 12 hours 02/21/16 2115 02/22/16 2140   02/22/16 0600  cefUROXime (ZINACEF) injection 1.5 g     1.5 g Intramuscular On call to O.R. 02/21/16 1523 02/21/16 1713       Maris Berger, PA-C [facility] Office: [phone] Pager: [phone] 02/24/2016 8:26 AM

## 2016-02-24 NOTE — Consult Note (Signed)
WOC wound consult note Reason for Consult: right LE medial and lateral fasciotomy sites, placement of VAC OR 02/22/16 First post operative dressing change at the bedside, contacted [facility] to have surgeon or PA at the bedside per hospital policy.  Wound type:  RLE fasciotomies  Measurement: see operative notes Wound bed: both sites, bleeding with good muscle color. No significant edema.  Drainage (amount, consistency, odor) sanguinous, canister on NPWT with 500 and filled from initial placement, canister changed by bedside nurse just prior to my arrival Periwound: intact Dressing procedure/placement/frequency: Extended visit with PA from [facility] at the bedside.  Total of 6 of MSO4 given IV due to patient's insist ance that pain was too much. PO Percocet administered per bedside nurse as well. Dressing change took greater than 1 hour.  Patient has lots of questions related to the dressing. Requested multiple times to have anesthesia for dressing change, we have explained that this type of dressing is routinely changed at the bedside until the time of closure.  He was pleasant but did ask WOC nurse to stop dressing change multiple times.  2pc of black foam used to cover the medial site. No Mepitel used at the time of initial placement per PA, and no need to place today per PA order due to planned closure on Friday in the OR.  VO to not use Mepitel placed in the patient's chart. 1pc of black foam used to cover the lateral site. Y connector used to attain seal of each site at .  Pt did have a lot of pain despite use of IV and PO pain meds.  Plans per PA for return to OR Friday.  WOC will keep patient on my list and follow along with you in case further dressing changes needed, would not expect bedside nursing to complete this type of dressing change.  Jewett Mcgann Perry RN,CWOCN [phone]

## 2016-02-24 NOTE — Progress Notes (Signed)
ANTICOAGULATION CONSULT NOTE - Follow Up Consult  Pharmacy Consult for Heparin  Indication: Subacute MI/vascular occlusion  No Known Allergies  Patient Measurements: Height: 5\' 8"  (172.7 cm) Weight: 206 lb 9.1 oz (93.7 kg) IBW/kg (Calculated) : 68.4  Heparin dosing wt: 87kg  Vital Signs: Temp: 98.5 F (36.9 C) (06/28 0848) Temp Source: Oral (06/28 0848) BP: 104/63 mmHg (06/28 0924) Pulse Rate: 83 (06/28 0924)  Labs:  Recent Labs  02/21/16 1342 02/21/16 2026  02/22/16 0400  02/23/16 0339 02/23/16 1325  02/23/16 2300 02/24/16 0320 02/24/16 0325 02/24/16 0940  HGB  --   --   < >  --   < > 9.4* 8.8*  --   --  8.8*  --   --   HCT  --   --   < >  --   < > 27.0* 25.3*  --   --  26.4*  --   --   PLT  --   --   < >  --   < > 178 197  --   --  198  --   --   APTT 25  --   --   --   --   --   --   --   --   --   --   --   LABPROT 14.5  --   --   --   --   --   --   --   --   --   --   --   INR 1.11  --   --   --   --   --   --   --   --   --   --   --   HEPARINUNFRC  --   --   < > 0.57  --  0.18*  --   < > 0.36  --  0.29* 0.48  CREATININE  --   --   --  1.01  --  0.97  --   --   --   --   --   --   TROPONINI  --  0.94*  --  0.85*  --   --   --   --   --   --   --   --   < > = values in this interval not displayed.  Estimated Creatinine Clearance: 94.4 mL/min (by C-G formula based on Cr of 0.97).   Assessment: 55 yo male with LV thrombus and R femoral embolism s/p embolectomy on 6/25 and now s/p hematoma evacuation. Heparin level is at goal. Plans noted for OR on Friday. -HL= 0.48 on 1350 units/hr   -hg= 8.8, plt= 198  Goal of Therapy:  Heparin level 0.3-0.7 units/ml, low end for now with calf hematoma Monitor platelets by anticoagulation protocol: Yes   Plan:  -Increase heparin to 1200 units/hr  -Heparin level in 8 hours and daily wth CBC daily  Harland German, Pharm D 02/24/2016 11:40 AM

## 2016-02-24 NOTE — Progress Notes (Signed)
Inpatient Diabetes Program Recommendations  AACE/ADA: New Consensus Statement on Inpatient Glycemic Control (2015)  Target Ranges:  Prepandial:   less than 140 mg/dL      Peak postprandial:   less than 180 mg/dL (1-2 hours)      Critically ill patients:  140 - 180 mg/dL   Results for ORMAND, SENN (MRN [id number]) as of 02/24/2016 12:38  Ref. Range 02/23/2016 03:39  Hemoglobin A1C Latest Ref Range: 4.8-5.6 % 9.2 (H)   Results for BURMAN, BRUINGTON (MRN [id number]) as of 02/24/2016 12:38  Ref. Range 02/23/2016 07:36 02/23/2016 11:47 02/23/2016 17:16 02/23/2016 21:35 02/24/2016 08:46  Glucose-Capillary Latest Ref Range: 65-99 mg/dL 208 (H) 210 (H) 223 (H) 168 (H) 181 (H)   Review of Glycemic Control  Diabetes history: New onset Current orders for Inpatient glycemic control: Lantus 10 units + Novolog meal coverage 4 units tid with meals + Novolog correction 0-15 units tid with meals + 0-5 units hs  Inpatient Diabetes Program Recommendations:  Reviewed CBGs. Please consider increase in Lantus insulin to 15 units q hs. Ordered book Living Well with Diabetes, patient educaton videos to be coordinated with nurses, pen needle starter kit. Nurses, please start teaching insulin injection administration when giving patient coverage and allow patient to start giving own injections. Also review insulin pen with patient when appropriate.  Thank you, Nani Gasser. Norberto Wishon, RN, MSN, CDE Inpatient Glycemic Control Team Team Pager [phone] (8am-5pm) 02/24/2016 12:44 PM

## 2016-02-24 NOTE — Progress Notes (Signed)
Occupational Therapy Treatment Patient Details Name: Troy Yu MRN: [id number] DOB: Dec 30, 1960 Today's Date: 02/24/2016    History of present illness 55 y.o. male with no significant hx of cad/pvd/angina/claudication. 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 adm with complaints of red right leg pain; + NSTEMI subacute; EF 30-35%; +left ventricle thrombus; +Rt femoral embolism, 6/25 femoral embolectomy and four compartment calf fasciotomies; 6/26 Evacuation of hematoma from bilateral right calf fasciotomies with placement of negative pressure dressings x 2 with plan for partial wound closeure with VAC replacement on 02/26/16   OT comments  This 55 yo male admitted and underwent above remains at same level of transfers as yesterday due to muliple lines/tubes and decreased balance when up on feet. He will continue to benefit from acute OT and hopefully will start attempting to put weight on RLE post next sx.  Follow Up Recommendations  No OT follow up;Supervision/Assistance - 24 hour    Equipment Recommendations  3 in 1 bedside comode       Precautions / Restrictions Precautions Precautions: Fall Precaution Comments: wound vac RLE Restrictions Weight Bearing Restrictions: No       Mobility Bed Mobility Overal bed mobility: Needs Assistance Bed Mobility: Sit to Supine     Sit to supine: Min assist (A for partially getting LLE onto bed and fully with RLE)    Transfers Overall transfer level: Needs assistance Equipment used: Rolling walker (2 wheeled) Transfers: Sit to/from Stand Sit to Stand: Min assist;+2 safety/equipment Stand pivot transfers: Min assist;+2 safety/equipment       General transfer comment: min A to steady, +2 for lines/ wond vac. Pt cannot tolerate wt on RLE    Balance Overall balance assessment: Needs assistance Sitting-balance support: No upper  extremity supported;Feet supported Sitting balance-Leahy Scale: Fair     Standing balance support: Bilateral upper extremity supported Standing balance-Leahy Scale: Poor Standing balance comment: reliant on Bil UE support in standing                    ADL Overall ADL's : Needs assistance/impaired                         Toilet Transfer: Minimal assistance;+2 for safety/equipment;RW                              Cognition   Behavior During Therapy: WFL for tasks assessed/performed Overall Cognitive Status: Within Functional Limits for tasks assessed                         Exercises General Exercises - Lower Extremity Ankle Circles/Pumps: AROM;Right;10 reps Other Exercises Other Exercises: pt able to verbalize proper positioning of his RLE and was propped with knee in extension and hip in neutral           Pertinent Vitals/ Pain       Pain Assessment: Faces Faces Pain Scale: Hurts whole lot Pain Location: RLE where wound/vac is Pain Descriptors / Indicators: Grimacing;Operative site guarding Pain Intervention(s): Limited activity within patient's tolerance;Monitored during session;Repositioned         Frequency Min 2X/week     Progress Toward Goals  OT Goals(current goals can now be found in the care plan section)  Progress towards OT goals: Not  progressing toward goals - comment (pt still requiring min A for ambulation due to multiple lines/tubes and unsteady on his feet due to not wanting to WB through RLE)  Acute Rehab OT Goals Patient Stated Goal: be able to walk again  Plan      Co-evaluation    PT/OT/SLP Co-Evaluation/Treatment: Yes (partial) Reason for Co-Treatment: For patient/therapist safety   OT goals addressed during session: ADL's and self-care;Strengthening/ROM      End of Session Equipment Utilized During Treatment: Gait belt;Rolling walker   Activity Tolerance Patient tolerated treatment well    Patient Left in bed;with bed alarm set;with family/visitor present           Time: 2956-2130 OT Time Calculation (min): 10 min  Charges: OT General Charges $OT Visit: 1 Procedure OT Treatments $Self Care/Home Management : 8-22 mins  Evette Georges [phone] 02/24/2016, 3:03 PM

## 2016-02-24 NOTE — Progress Notes (Signed)
Physical Therapy Treatment Patient Details Name: Troy Yu MRN: [id number] DOB: [birth year] Today's Date: 02/24/2016    History of Present Illness 55 y.o. male with no significant hx of cad/pvd/angina/claudication. 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 adm with complaints of red right leg pain; + NSTEMI subacute; EF 30-35%; +left ventricle thrombus; +Rt femoral embolism, 6/25 femoral embolectomy and four compartment calf fasciotomies; 6/26 Evacuation of hematoma from bilateral right calf fasciotomies with placement of negative pressure dressings x 2 with plan for partial wound closeure with VAC replacement on 02/26/16    PT Comments    Pt ambulated to and from bathroom with unsuccessful attempt at bowel movement. Pt still unable to tolerate wt on RLE so is hopping with RW, is somewhat impulsive, working on safety with transfers and ambulation as well as proper positioning of the RLE. PT will continue to follow.   Follow Up Recommendations  Home health PT;Supervision for mobility/OOB     Equipment Recommendations  Rolling walker with 5" wheels (possible wheelchair (depending on progress))    Recommendations for Other Services       Precautions / Restrictions Precautions Precautions: Fall Precaution Comments: wound vac RLE Restrictions Weight Bearing Restrictions: No    Mobility  Bed Mobility Overal bed mobility: Needs Assistance Bed Mobility: Sit to Supine;Supine to Sit     Supine to sit: Min assist Sit to supine: Min assist   General bed mobility comments: pt needed min HHA to lift trunk to full sitting and min A to RLE for return to supine  Transfers Overall transfer level: Needs assistance Equipment used: Rolling walker (2 wheeled) Transfers: Sit to/from Stand Sit to Stand: Min assist;+2 safety/equipment         General transfer comment: min A to steady,  +2 for lines/ wond vac. Pt cannot tolerate wt on RLE  Ambulation/Gait Ambulation/Gait assistance: Min assist;+2 safety/equipment Ambulation Distance (Feet): 16 Feet (8' x2) Assistive device: Rolling walker (2 wheeled)   Gait velocity: decreased Gait velocity interpretation: Below normal speed for age/gender General Gait Details: hopping pattern on LLE, vc's for moving safely as pt tends to be impulsive. Practiced turning with RW as well as backing.   Stairs            Wheelchair Mobility    Modified Rankin (Stroke Patients Only)       Balance Overall balance assessment: Needs assistance Sitting-balance support: No upper extremity supported Sitting balance-Leahy Scale: Fair     Standing balance support: Bilateral upper extremity supported Standing balance-Leahy Scale: Poor Standing balance comment: needs bilateral UE support for safety in standing                    Cognition Arousal/Alertness: Awake/alert Behavior During Therapy: WFL for tasks assessed/performed Overall Cognitive Status: Within Functional Limits for tasks assessed                      Exercises General Exercises - Lower Extremity Ankle Circles/Pumps: AROM;Right;10 reps Other Exercises Other Exercises: pt able to verbalize proper positioning of his RLE and was propped with knee in extension and hip in neutral    General Comments General comments (skin integrity, edema, etc.): pt with oozing from distal wound, RN notified and covered before mobility      Pertinent Vitals/Pain Pain Assessment: Faces Faces Pain Scale: Hurts whole lot Pain Location: wound  RLE Pain Descriptors / Indicators: Operative site guarding;Grimacing Pain Intervention(s): Limited activity within patient's tolerance;Monitored during session;Repositioned    Home Living                      Prior Function            PT Goals (current goals can now be found in the care plan section) Acute Rehab  PT Goals Patient Stated Goal: be able to walk again PT Goal Formulation: With patient Time For Goal Achievement: 03/01/16 Potential to Achieve Goals: Good Progress towards PT goals: Progressing toward goals    Frequency  Min 4X/week    PT Plan Current plan remains appropriate    Co-evaluation             End of Session Equipment Utilized During Treatment: Gait belt Activity Tolerance: Patient tolerated treatment well Patient left: with call bell/phone within reach;with family/visitor present;in bed     Time: 1049-1130 PT Time Calculation (min) (ACUTE ONLY): 41 min  Charges:  $Gait Training: 8-22 mins $Therapeutic Activity: 23-37 mins                    G Codes:     Lyanne Co, PT  Acute Rehab Services  [phone]  [place], [place] 02/24/2016, 2:20 PM

## 2016-02-25 LAB — GLUCOSE, CAPILLARY
GLUCOSE-CAPILLARY: 217 mg/dL — AB (ref 65–99)
Glucose-Capillary: 249 mg/dL — ABNORMAL HIGH (ref 65–99)
Glucose-Capillary: 209 mg/dL — ABNORMAL HIGH (ref 65–99)
Glucose-Capillary: 238 mg/dL — ABNORMAL HIGH (ref 65–99)

## 2016-02-25 LAB — BASIC METABOLIC PANEL
ANION GAP: 5 (ref 5–15)
BUN: 5 mg/dL — AB (ref 6–20)
CALCIUM: 7.6 mg/dL — AB (ref 8.9–10.3)
CO2: 26 mmol/L (ref 22–32)
CREATININE: 0.88 mg/dL (ref 0.61–1.24)
Chloride: 102 mmol/L (ref 101–111)
GFR calc Af Amer: >60 mL/min (ref >60)
GFR calc non Af Amer: >60 mL/min (ref >60)
GLUCOSE: 234 mg/dL — AB (ref 65–99)
Potassium: 4.2 mmol/L (ref 3.5–5.1)
Sodium: 133 mmol/L — ABNORMAL LOW (ref 135–145)

## 2016-02-25 LAB — CBC
HEMATOCRIT: 22.7 % — AB (ref 39.0–52.0)
Hemoglobin: 7.8 g/dL — ABNORMAL LOW (ref 13.0–17.0)
MCH: 29.3 pg (ref 26.0–34.0)
MCHC: 34.4 g/dL (ref 30.0–36.0)
MCV: 85.3 fL (ref 78.0–100.0)
Platelets: 265 10*3/uL (ref 150–400)
RBC: 2.66 MIL/uL — ABNORMAL LOW (ref 4.22–5.81)
RDW: 12.3 % (ref 11.5–15.5)
WBC: 9.6 10*3/uL (ref 4.0–10.5)

## 2016-02-25 LAB — PREPARE RBC (CROSSMATCH)

## 2016-02-25 LAB — HEPARIN LEVEL (UNFRACTIONATED): Heparin Unfractionated: 0.62 IU/mL (ref 0.30–0.70)

## 2016-02-25 LAB — ABO/RH: ABO/RH(D): A POS

## 2016-02-25 MED ORDER — SODIUM CHLORIDE 0.9 % IV BOLUS (SEPSIS)
500.0000 mL | Freq: Once | INTRAVENOUS | Status: AC
  Administered 2016-02-25: 500 mL via INTRAVENOUS

## 2016-02-25 MED ORDER — DEXTROSE 5 % IV SOLN
1.5000 g | INTRAVENOUS | Status: AC
  Administered 2016-02-26: 1.5 g via INTRAVENOUS
  Filled 2016-02-25 (×2): qty 1.5

## 2016-02-25 MED ORDER — SODIUM CHLORIDE 0.9 % IV SOLN: Freq: Once | INTRAVENOUS | Status: AC

## 2016-02-25 MED ORDER — INSULIN GLARGINE 100 UNIT/ML ~~LOC~~ SOLN
15.0000 [IU] | Freq: Every day | SUBCUTANEOUS | Status: DC
Start: 2016-02-25 — End: 2016-02-26
  Administered 2016-02-25: 15 [IU] via SUBCUTANEOUS
  Filled 2016-02-25 (×2): qty 0.15

## 2016-02-25 MED ORDER — SODIUM CHLORIDE 0.9 % IV BOLUS (SEPSIS): 500.0000 mL | INTRAVENOUS | Status: DC | PRN

## 2016-02-25 NOTE — Progress Notes (Signed)
Issues over custody of patient's daughter arose on hospital premises in hallway outside unit. Daughter was seen by visitors being pulled by family down the hallway and family members yelling. [employer] was called and came on unit and talked with patient, his ex-wife, and other family for about 2 hours. Patient is very upset about his situation with his ex wife and that [employer] got involved. Hospital security aware as well.

## 2016-02-25 NOTE — Progress Notes (Signed)
   02/25/16 2230  Vitals  BP (!) 95/58 mmHg  MAP (mmHg) 68  Pulse Rate 94  ECG Heart Rate 94  Resp 18  Oxygen Therapy  SpO2 100 %  500 ml NS bolus complete. Patient drowsy without complaints at this time. Will continue to monitor.

## 2016-02-25 NOTE — Progress Notes (Signed)
Patient becoming more drowsy. BP slowing trending down, now SBP 82-86. Patient states he feels really tired. Donnamarie Poag NP paged. Known Hgb 7.6; in which patient had refused blood products earlier. Patient stated it is not in relation to religious beliefs, he just did not want the blood at the time. Educated patient and family on purpose and possible need for blood products.

## 2016-02-25 NOTE — Progress Notes (Signed)
Subjective: Interval History: none.. Had some difficulty with VAC dressing on the lateral aspect of his right leg. Patient is very upset about this. Again very concerned.  Objective: Vital signs in last 24 hours: Temp:  [97.4 F (36.3 C)-99.2 F (37.3 C)] 97.4 F (36.3 C) (06/29 0922) Pulse Rate:  [79-109] 92 (06/29 1000) Resp:  [12-26] 16 (06/29 1000) BP: (88-149)/(51-87) 107/66 mmHg (06/29 1000) SpO2:  [98 %-100 %] 100 % (06/29 1000) Weight:  [200 lb 13.4 oz (91.1 kg)] 200 lb 13.4 oz (91.1 kg) (06/29 0500)  Intake/Output from previous day: 06/28 0701 - 06/29 0700 In: 2242.5 [P.O.:120; I.V.:2122.5] Out: 2345 [Urine:1325; Drains:1020] Intake/Output this shift: Total I/O In: 267 [I.V.:267] Out: 2100 [Urine:2100]  Right groin without hematoma. 3+ dorsalis pedis pulse on the right. Medial VAC wound intact. Lateral not keeping a seal and some blood under the VAC dressing.  Lab Results:  Recent Labs  02/24/16 0320 02/25/16 0348  WBC 9.3 9.6  HGB 8.8* 7.8*  HCT 26.4* 22.7*  PLT 198 265   BMET  Recent Labs  02/23/16 0339 02/25/16 0348  NA 132* 133*  K 3.5 4.2  CL 102 102  CO2 24 26  GLUCOSE 275* 234*  BUN 10 5*  CREATININE 0.97 0.88  CALCIUM 7.5* 7.6*    Studies/Results: Dg Chest 2 View  02/21/2016  CLINICAL DATA:  Left lower extremity numbness and pain. EXAM: CHEST  2 VIEW COMPARISON:  None. FINDINGS: Normal heart size. Low lung volumes. Top-normal heart size. Mediastinal contour is within normal limits. No pneumothorax. No pleural effusion. Lungs appear clear, with no acute consolidative airspace disease and no pulmonary edema. IMPRESSION: Low lung volumes.  No active disease in the chest. Electronically Signed   By: Delbert Phenix M.D.   On: 02/21/2016 11:27   Ct Angio Ao+bifem W &/or Wo Contrast  02/21/2016  CLINICAL DATA:  Right lower extremity pain with diminished pulses and numbness. EXAM: CT ANGIOGRAPHY OF ABDOMINAL AORTA WITH ILIOFEMORAL RUNOFF TECHNIQUE:  Multidetector CT imaging of the abdomen, pelvis and lower extremities was performed using the standard protocol during bolus administration of intravenous contrast. Multiplanar CT image reconstructions and MIPs were obtained to evaluate the vascular anatomy. CONTRAST:  100 mL Isovue 370 IV COMPARISON:  None. FINDINGS: Aorta: Normally patent abdominal aorta without evidence of atherosclerosis, aneurysm or dissection. Visceral arteries are widely patent including the celiac axis, superior mesenteric artery, inferior mesenteric artery and bilateral single renal arteries. No embolic occlusions identified in the abdomen or pelvis. Right Lower Extremity: Widely patent right common, external and internal iliac arteries. The common femoral artery is normally patent up to its bifurcation. At the femoral bifurcation, there is nearly occlusive thrombus identified extending into both proximal profunda femoral and superficial femoral artery trunks. Contrast flow is seen beyond the thrombus and into normal distal profunda femoral branches. The SFA is open, but demonstrates some anterior mural thrombus. In the absence of significant atherosclerosis, this may represent some adherent thrombus from recent/prior embolic event. Anterior mural thrombus continues into the popliteal artery above the knee. Below the knee, there is only faint opacification of the distal popliteal artery and no visualized opacification of the tibial arteries. This is likely due to slow flow. However, component of thrombus in the tibial arteries cannot be entirely excluded. Left Lower Extremity: Left-sided arterial supply is completely normal including the common femoral artery, profunda femoral artery, superficial femoral artery, popliteal artery and tibial arteries. Or there is some narrowing of the popliteal artery just  above the knee joint of approximately 40-50% is associated with a slightly tortuous/ course. No plaque is identified at this level. A  component of incidental popliteal entrapment cannot be excluded. Review of the MIP images confirms the above findings. Lower chest:  Bibasilar atelectasis.  No pleural effusions. Hepatobiliary: Unremarkable arterial phase imaging of the liver and gallbladder. Pancreas: Unremarkable appearance. Spleen: Normal appearance of spleen. Adrenals/Urinary Tract: Normal adrenal glands and kidneys. No renal infarcts identified. Stomach/Bowel: Normal appearance without evidence to suggest mesenteric ischemia or inflammation. No obstruction. Normal appendix. No free air, free fluid or abscess. Lymphatic: No enlarged lymph nodes identified. Reproductive: Negative. Other: No hernias. Musculoskeletal: Bony structures are normal. IMPRESSION: 1. Embolic thrombus at the bifurcation of the right common femoral artery with thrombus extending into proximal profunda femoral and superficial femoral arteries. Thrombus is nearly occlusive but contrast is seen to flow around the thrombus into the distal vessels. There is also a component of probable anterior mural thrombus in the SFA and popliteal arteries which may be consistent with additional adherent thrombus. Tibial arteries are not adequately opacified on the right, most likely secondary to slow flow. Distal thrombus cannot be excluded. Vascular Surgical consultation is recommended. 2. Incidental finding of a mildly tortuous course of the popliteal artery on the left associated with some narrowing just above the knee joint. This narrowing does not appear atherosclerotic and there may be an incidental component of popliteal entrapment syndrome on the left. These results were called by telephone at the time of interpretation on 02/21/2016 at 2:55 pm to Dr. Zadie Rhine , who verbally acknowledged these results. Electronically Signed   By: Irish Lack M.D.   On: 02/21/2016 14:56   Anti-infectives: Anti-infectives    Start     Dose/Rate Route Frequency Ordered Stop   02/26/16  0600  cefUROXime (ZINACEF) 1.5 g in dextrose 5 % 50 mL IVPB     1.5 g 100 mL/hr over 30 Minutes Intravenous On call to O.R. 02/25/16 1055 02/27/16 0559   02/22/16 0800  cefUROXime (ZINACEF) 1.5 g in dextrose 5 % 50 mL IVPB     1.5 g 100 mL/hr over 30 Minutes Intravenous Every 12 hours 02/21/16 2115 02/22/16 2140   02/22/16 0600  cefUROXime (ZINACEF) injection 1.5 g     1.5 g Intramuscular On call to O.R. 02/21/16 1523 02/21/16 1713      Assessment/Plan: s/p Procedure(s): EVACUATION HEMATOMA With Placement of Negative Pressure Dressing (Right) APPLICATION OF Negative Pressure Dressing Right Lower Leg (Right) Stable overall. Again discussed with the patient and his son present the fact that the malfunctioning back is not causing any danger. Explained that he could have ongoing normal saline wet-to-dry care only. Explained that the [facility] dressing typically can lessen the time required for closure. Will remove the lateral portion of the VAC dressing and dressed this with saline today. Will take to the operating room tomorrow and explained to the patient this would be mid morning around 9:00. Will closes much as possible of his skin without tension. Explain the objective is to decrease the amount of open area that we'll have to either close by secondary attention intention or have skin grafting. Patient understands and I will plan for surgery tomorrow.  Dr.Mikhail was present as well and we both discussed possibility for transfusion. Has had continued drop in his hemoglobin and hematocrit. Patient currently refusing consideration for transfusion. We will monitor this but may need to proceed with transfusion at some point.   LOS: 4  days   Gretta Began 02/25/2016, 10:56 AM

## 2016-02-25 NOTE — Progress Notes (Addendum)
While starting to change patient's right leg dressing, patient c/o nausea and attempted to turn self toward left side to vomit, pt became unresponsive, eyes rolled upward and patient's upper body began to twitch rapidly. HR 122. BP 99/63. Event lasted approximately 15-20 secs. Patient then became responsive. Donnamarie Poag NP notified and at bedside.

## 2016-02-25 NOTE — Progress Notes (Signed)
Patient ID: Kairee Isa, male   DOB: Sep 26, 1960, 55 y.o.   MRN: [id number]   Patient Name: Troy Yu Date of Encounter: 02/25/2016  Primary Cardiologist: Dr. Eden Emms   Principal Problem:   LV (left ventricular) mural thrombus [facility]) Active Problems:   Anterior subendocardial MI [facility])   Right leg pain   Non-STEMI (non-ST elevated myocardial infarction) (HCC)   Elevated troponin   Abnormal EKG   Diabetes (HCC)   Acute MI (HCC)   History of embolectomy   Pain   Thromboembolism (HCC)   Vascular occlusion    SUBJECTIVE  No chest pain or dyspnea. Waiting for OR tomorrow.   CURRENT MEDS . aspirin  325 mg Oral Daily  . atorvastatin  80 mg Oral q1800  . docusate sodium  100 mg Oral Daily  . insulin aspart  0-15 Units Subcutaneous TID WC  . insulin aspart  0-5 Units Subcutaneous QHS  . insulin aspart  4 Units Subcutaneous TID WC  . insulin glargine  15 Units Subcutaneous QHS  . pantoprazole  40 mg Oral Daily  . propranolol  10 mg Oral TID    OBJECTIVE  Filed Vitals:   02/25/16 0322 02/25/16 0400 02/25/16 0500 02/25/16 0922  BP: 95/70 107/63  98/54  Pulse: 80 79  80  Temp: 98.5 F (36.9 C)   97.4 F (36.3 C)  TempSrc: Oral   Oral  Resp: 16   17  Height:      Weight:   200 lb 13.4 oz (91.1 kg)   SpO2: 100% 100%  100%    Intake/Output Summary (Last 24 hours) at 02/25/16 0935 Last data filed at 02/25/16 0500  Gross per 24 hour  Intake   1716 ml  Output   2070 ml  Net   -354 ml   Filed Weights   02/22/16 0400 02/23/16 0443 02/25/16 0500  Weight: 204 lb 5.9 oz (92.7 kg) 206 lb 9.1 oz (93.7 kg) 200 lb 13.4 oz (91.1 kg)    PHYSICAL EXAM  General: Pleasant, NAD. Neuro: Alert and oriented X 3. Moves all extremities spontaneously. Psych: Normal affect. HEENT:  Normal  Neck: Supple without bruits or JVD. Lungs:  Resp regular and unlabored, CTA. Heart: RRR no s3, s4, or murmurs. Abdomen: Soft, non-tender, non-distended, BS + x 4.  Extremities: No  clubbing, cyanosis. Great DP pulse in RLE. RLE has draining blood  Accessory Clinical Findings  CBC  Recent Labs  02/24/16 0320 02/25/16 0348  WBC 9.3 9.6  HGB 8.8* 7.8*  HCT 26.4* 22.7*  MCV 84.3 85.3  PLT 198 265   Basic Metabolic Panel  Recent Labs  02/23/16 0339 02/25/16 0348  NA 132* 133*  K 3.5 4.2  CL 102 102  CO2 24 26  GLUCOSE 275* 234*  BUN 10 5*  CREATININE 0.97 0.88  CALCIUM 7.5* 7.6*   Liver Function Tests  Recent Labs  02/23/16 0339  AST 15  ALT 17  ALKPHOS 35*  BILITOT 0.5  PROT 4.6*  ALBUMIN 2.1*   Cardiac Enzymes No results for input(s): CKTOTAL, CKMB, CKMBINDEX, TROPONINI in the last 72 hours. Fasting Lipid Panel  Recent Labs  02/23/16 0339  CHOL 116  HDL 30*  LDLCALC 60  TRIG 478  CHOLHDL 3.9    TELE NSR without ventricular ectopy, HR 80s    ECG  No new EKG  Echocardiogram 02/21/2016  LV EF: 30% - 35%  ------------------------------------------------------------------- Indications: Chest pain 786.51.  ------------------------------------------------------------------- History: PMH: Right Leg Pain, Chest pain  2 weeks ago. Angina pectoris. PMH: Myocardial infarction.  ------------------------------------------------------------------- Study Conclusions  - Left ventricle: Anteroapical infarct with large thrombus burden  at apex. Mobile and high embolic potential. The cavity size was  severely dilated. Wall thickness was normal. Systolic function  was moderately to severely reduced. The estimated ejection  fraction was in the range of 30% to 35%. - Atrial septum: No defect or patent foramen ovale was identified.    Radiology/Studies  Dg Chest 2 View  02/21/2016  CLINICAL DATA:  Left lower extremity numbness and pain. EXAM: CHEST  2 VIEW COMPARISON:  None. FINDINGS: Normal heart size. Low lung volumes. Top-normal heart size. Mediastinal contour is within normal limits. No pneumothorax. No  pleural effusion. Lungs appear clear, with no acute consolidative airspace disease and no pulmonary edema. IMPRESSION: Low lung volumes.  No active disease in the chest. Electronically Signed   By: Delbert Phenix M.D.   On: 02/21/2016 11:27   Ct Angio Ao+bifem W &/or Wo Contrast  02/21/2016  CLINICAL DATA:  Right lower extremity pain with diminished pulses and numbness. EXAM: CT ANGIOGRAPHY OF ABDOMINAL AORTA WITH ILIOFEMORAL RUNOFF TECHNIQUE: Multidetector CT imaging of the abdomen, pelvis and lower extremities was performed using the standard protocol during bolus administration of intravenous contrast. Multiplanar CT image reconstructions and MIPs were obtained to evaluate the vascular anatomy. CONTRAST:  100 mL Isovue 370 IV COMPARISON:  None. FINDINGS: Aorta: Normally patent abdominal aorta without evidence of atherosclerosis, aneurysm or dissection. Visceral arteries are widely patent including the celiac axis, superior mesenteric artery, inferior mesenteric artery and bilateral single renal arteries. No embolic occlusions identified in the abdomen or pelvis. Right Lower Extremity: Widely patent right common, external and internal iliac arteries. The common femoral artery is normally patent up to its bifurcation. At the femoral bifurcation, there is nearly occlusive thrombus identified extending into both proximal profunda femoral and superficial femoral artery trunks. Contrast flow is seen beyond the thrombus and into normal distal profunda femoral branches. The SFA is open, but demonstrates some anterior mural thrombus. In the absence of significant atherosclerosis, this may represent some adherent thrombus from recent/prior embolic event. Anterior mural thrombus continues into the popliteal artery above the knee. Below the knee, there is only faint opacification of the distal popliteal artery and no visualized opacification of the tibial arteries. This is likely due to slow flow. However, component of  thrombus in the tibial arteries cannot be entirely excluded. Left Lower Extremity: Left-sided arterial supply is completely normal including the common femoral artery, profunda femoral artery, superficial femoral artery, popliteal artery and tibial arteries. Or there is some narrowing of the popliteal artery just above the knee joint of approximately 40-50% is associated with a slightly tortuous/ course. No plaque is identified at this level. A component of incidental popliteal entrapment cannot be excluded. Review of the MIP images confirms the above findings. Lower chest:  Bibasilar atelectasis.  No pleural effusions. Hepatobiliary: Unremarkable arterial phase imaging of the liver and gallbladder. Pancreas: Unremarkable appearance. Spleen: Normal appearance of spleen. Adrenals/Urinary Tract: Normal adrenal glands and kidneys. No renal infarcts identified. Stomach/Bowel: Normal appearance without evidence to suggest mesenteric ischemia or inflammation. No obstruction. Normal appendix. No free air, free fluid or abscess. Lymphatic: No enlarged lymph nodes identified. Reproductive: Negative. Other: No hernias. Musculoskeletal: Bony structures are normal. IMPRESSION: 1. Embolic thrombus at the bifurcation of the right common femoral artery with thrombus extending into proximal profunda femoral and superficial femoral arteries. Thrombus is nearly occlusive but contrast is  seen to flow around the thrombus into the distal vessels. There is also a component of probable anterior mural thrombus in the SFA and popliteal arteries which may be consistent with additional adherent thrombus. Tibial arteries are not adequately opacified on the right, most likely secondary to slow flow. Distal thrombus cannot be excluded. Vascular Surgical consultation is recommended. 2. Incidental finding of a mildly tortuous course of the popliteal artery on the left associated with some narrowing just above the knee joint. This narrowing does  not appear atherosclerotic and there may be an incidental component of popliteal entrapment syndrome on the left. These results were called by telephone at the time of interpretation on 02/21/2016 at 2:55 pm to Dr. Zadie Rhine , who verbally acknowledged these results. Electronically Signed   By: Irish Lack M.D.   On: 02/21/2016 14:56    ASSESSMENT AND PLAN  1. Subacute anterior MI: likely occurred 2 weeks prior to arrival  - Echo 02/21/2016 EF 30-35%, anteroapical infarct with large thrombus burden at apex, mobile and high embolic potential  - once RLE healed will need further coronary evaluation and risk stratification for AICD.  Add ACE before d/c when BP allows  - good response to beta blocker HR in 80's now   2. Ischemic leg  - CTA of leg showed embolic thrombus at the bifurcation of R common femoral artery extending into proximal profunda femoral and superficial femoral arteries.  - 02/21/2016 emergent R leg embolectomy, R calf four compartment fasciotomies  - post procedure complicated by presence of hematoma  - 02/22/2016 s/p evacuation of hematoma from bilateral R calf fasciotomies, application of VAC  - Dr Early to debride today and ? Partially close fasciotomy on Friday   3. Newly diagnosed DM: per hospitalist  4. Worsening postop anemia: hgb 14.2 --> 12.8 --> 9.4 --> 8.8 --> 8.8 --> 7.8. Continue to monitor. Patient is on IV heparin. Some wound vac issue overnight, still has trouble despite change. Still bleeding from R leg, noticeable pool of blood under the dressing. Typed and crossed. Transfuse as needed.  5. Hypotension: transfuse PRBC for anemia if hgb < 8.0 and give IV fluid. Likely dry. Hold BB if SBP < 105  Charlton Haws

## 2016-02-25 NOTE — Progress Notes (Signed)
Wound vac on lateral site was alarming high suction alarm and negative air alarms. Site dressing began leaking sanguinous drainage like earlier. Wound vac machine changed from earlier this evening to rule out defective pump. Tried to reinforce transparent film to ensure no air leaks. Negative air alarms continued to occur. Wound vac machine for lateral site paused for now. Vasc MD I spoke with earlier tonight stated that it was okay to pause machine until AM if unsuccessful in troubleshooting. Will continue to monitor.

## 2016-02-25 NOTE — Progress Notes (Signed)
Wound vac on lateral site was losing suction and continuously had obstruction alarms on the vac machine. Clots could be seen in the tubing from the lateral site. Medial site functioning well. MD on call paged and notified. Dilaudid given to patient before interventions started. Transparent film removed from lateral site and clots found around entry site of tubing on black foam. Tubing replaced and new transparent film applied.Lateral and medial sites now have separate vac pumps. Lateral wound site regained suction and is functioning without issue now. Will continue to monitor.

## 2016-02-25 NOTE — Progress Notes (Signed)
PROGRESS NOTE    Troy Yu  EYC:[id number] DOB: 1960-12-08 DOA: 02/21/2016 PCP: No primary care provider on file.   Brief Narrative:  Troy Yu is an 55 y.o. male who was in his usual state of health until about 2 weeks ago when he had substernal chest pain that lasted about 48 hours.The pain went away after he vomited, and he attributed his symptoms to a prolonged religious fast. He then come to the [facility] ED on 02/21/16 with severe RLE pain associated with paresthesias. Upon presentation, he was found to have an abnormal EKG and elevated troponin. Cardiology was subsequently called to evaluate the patient, and he was felt to have suffered a subacute anterior MI. STAT echo was ordered and EDP advised to order an arterial duplex with recommendation to consult vascular surgery. IV heparin ordered and the patient was transferred to [facility] for further evaluation by vascular surgery, as it was felt that he had a right femoral embolism with concern for threatened limb. Dr Imogene Burn subsequently performed a right femoral embolectomy and right calf compartment fasciotomies x 4 . At 4:16 a.m. On 02/22/16, the patient complained of loss of sensation to RLE, and the patient reports he was in severe pain. He was given pain medication which did not alleviate his symptoms. He was re-evaluated by Dr. Imogene Burn at 7:33 a.m. Who removed the ACE wrap from his RLE which relieved the pain, and the patient was felt to have excessive bleeding in his calf related to full anti-coagulation. He was taken back to surgery for evacuation of hematoma and VAC dressings. Cardiologist continues to follow for management of his subacute MI.  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  -Wound VAC in place.  Lateral side not functioning properly.  Spoke with Dr. Arbie Cookey, plan for saline dressing today (wet-to-dry) -Case taken over by Dr. Arbie Cookey after patient requested a second opinion.  -Lipids well controlled, cholesterol 116, LDL 60.  -Likely for surgical revision later this week, 02/26/2016  Anterior subendocardial MI (HCC)/Abnormal EKG/Elevated troponin -On therapeutic dose heparin.  -Cardiology consulted and appreciated -Continue Inderal -ACE-I to be started when BP able to tolerate -Will eventually need cardiac cath  Newly diagnosed diabetes mellitus/Hyperglycemia -Hemoglobin A1c 9.2.  -Continue lantus, ISS and CBG monitoring.  -Diabetes coordinator consulted  Hyponatremia -Likely from hyperglycemia. Correct blood glucose and monitor. -Continue to monitor BMP  Postoperative anemia -Hemoglobin currently 7.8 -Currently on heparin -Spoke with patient regarding blood transfusion, he refused today.   -Continue to monitor CBC  DVT Prophylaxis  heparin  Code Status: Full  Family Communication: Son at bedside  Disposition Plan: Admitted. Pending surgery on Friday. Monitor hemoglobin  Consultants Cardiology  Vascular Surgery  Procedures  Right femoral embolectomy Right calf four compartment fasciotomies  Evacuation of hematoma from bilateral right calf fasciotomies Placement of negative pressure dressings x2  Antibiotics   Anti-infectives    Start     Dose/Rate Route Frequency Ordered Stop   02/26/16 0930  cefUROXime (ZINACEF) 1.5 g in dextrose 5 % 50 mL IVPB     1.5 g 100 mL/hr over 30 Minutes Intravenous To [facility] 02/25/16 1055 02/27/16 0930   02/22/16 0800  cefUROXime (ZINACEF) 1.5 g in dextrose 5 % 50 mL  IVPB     1.5 g 100 mL/hr over 30 Minutes Intravenous Every 12 hours 02/21/16 2115 02/22/16 2140   02/22/16 0600  cefUROXime (ZINACEF) injection 1.5 g     1.5 g  Intramuscular On call to O.R. 02/21/16 1523 02/21/16 1713      Subjective:   Troy Yu seen and examined today.  Patient is very upset that part of his wound vac is not functioning properly.  Denies  chest pain, shortness of breath, abdominal pain.   Objective:   Filed Vitals:   02/25/16 0800 02/25/16 0900 02/25/16 0922 02/25/16 1000  BP:   98/54 107/66  Pulse: 89 99 80 92  Temp:   97.4 F (36.3 C)   TempSrc:   Oral   Resp:   17 16  Height:      Weight:      SpO2: 100% 98% 100% 100%    Intake/Output Summary (Last 24 hours) at 02/25/16 1120 Last data filed at 02/25/16 1000  Gross per 24 hour  Intake 2035.51 ml  Output   4170 ml  Net -2134.49 ml   Filed Weights   02/22/16 0400 02/23/16 0443 02/25/16 0500  Weight: 92.7 kg (204 lb 5.9 oz) 93.7 kg (206 lb 9.1 oz) 91.1 kg (200 lb 13.4 oz)    Exam  General: Well developed, well nourished, NAD  HEENT: NCAT, mucous membranes moist.   Cardiovascular: S1 S2 auscultated, no murmurs, RRR  Respiratory: Clear to auscultation bilaterally   Abdomen: Soft, nontender, nondistended, + bowel sounds  Extremities: warm dry without cyanosis clubbing or edema. RLE with wound vac in place- lateral aspect not keeping seal.   Neuro: AAOx3, nonfocal  Psych: Anxious, however, appropriate   Data Reviewed: I have personally reviewed following labs and imaging studies  CBC:  Recent Labs Lab 02/21/16 0827  02/22/16 0500 02/23/16 0339 02/23/16 1325 02/24/16 0320 02/25/16 0348  WBC 10.1  < > 12.6* 7.8 8.3 9.3 9.6  NEUTROABS 7.9*  --   --   --   --   --   --   HGB 15.5  < > 12.8* 9.4* 8.8* 8.8* 7.8*  HCT 42.8  < > 37.9* 27.0* 25.3* 26.4* 22.7*  MCV 82.6  < > 84.4 83.9 84.6 84.3 85.3  PLT 250  < > 266 178 197 198 265  < > = values in this interval not displayed. Basic Metabolic Panel:  Recent Labs Lab 02/21/16 0827 02/22/16 0400 02/23/16 0339 02/25/16 0348  NA 132* 130* 132* 133*  K 4.5 4.4 3.5 4.2  CL 100* 101  102 102  CO2 GLUCOSE 216* 303* 275* 234*  BUN 5*  CREATININE 0.94 1.01 0.97 0.88  CALCIUM 8.6* 7.9* 7.5* 7.6*   GFR: Estimated Creatinine Clearance: 102.7 mL/min (by C-G formula based on Cr of 0.88). Liver Function Tests:  Recent Labs Lab 02/23/16 0339  AST 15  ALT 17  ALKPHOS 35*  BILITOT 0.5  PROT 4.6*  ALBUMIN 2.1*   No results for input(s): LIPASE, AMYLASE in the last 168 hours. No results for input(s): AMMONIA in the last 168 hours. Coagulation Profile:  Recent Labs Lab 02/21/16 1342  INR 1.11   Cardiac Enzymes:  Recent Labs Lab 02/21/16 0827 02/21/16 2026 02/22/16 0400  TROPONINI 0.84* 0.94* 0.85*   BNP (last 3 results) No results for input(s): PROBNP in the last 8760 hours. HbA1C:  Recent Labs  02/23/16 0339  HGBA1C 9.2*   CBG:  Recent Labs Lab 02/24/16 0846 02/24/16 1314 02/24/16 1713 02/24/16 2242 02/25/16 0714  GLUCAP 181* 191* 238* 173* 249*   Lipid Profile:  Recent Labs  02/23/16 0339  CHOL 116  HDL 30*  LDLCALC 60  TRIG 409  CHOLHDL 3.9   Thyroid Function Tests: No results for input(s): TSH, T4TOTAL, FREET4, T3FREE, THYROIDAB in the last 72 hours. Anemia Panel: No results for input(s): VITAMINB12, FOLATE, FERRITIN, TIBC, IRON, RETICCTPCT in the last 72 hours. Urine analysis: No results found for: COLORURINE, APPEARANCEUR, LABSPEC, PHURINE, GLUCOSEU, HGBUR, BILIRUBINUR, KETONESUR, PROTEINUR, UROBILINOGEN, NITRITE, LEUKOCYTESUR Sepsis Labs: @LABRCNTIP (procalcitonin:4,lacticidven:4)  ) Recent Results (from the past 240 hour(s))  MRSA PCR Screening     Status: None   Collection Time: 02/21/16 10:17 PM  Result Value Ref Range Status   MRSA by PCR NEGATIVE NEGATIVE Final    Comment:        The GeneXpert MRSA Assay (FDA approved for NASAL specimens only), is one component of a comprehensive MRSA colonization surveillance program. It is not intended to diagnose MRSA infection nor to guide  or monitor treatment for MRSA infections.       Radiology Studies: No results found.   Scheduled Meds: . aspirin  325 mg Oral Daily  . atorvastatin  80 mg Oral q1800  . [START ON 02/26/2016] cefUROXime (ZINACEF)  IV  1.5 g Intravenous To SS-Surg  . docusate sodium  100 mg Oral Daily  . insulin aspart  0-15 Units Subcutaneous TID WC  . insulin aspart  0-5 Units Subcutaneous QHS  . insulin aspart  4 Units Subcutaneous TID WC  . insulin glargine  15 Units Subcutaneous QHS  . pantoprazole  40 mg Oral Daily  . propranolol  10 mg Oral TID   Continuous Infusions: . sodium chloride 75 mL/hr at 02/25/16 0358  . heparin 1,350 Units/hr (02/25/16 0540)     LOS: 4 days   Time Spent in minutes   30 minutes  Troy Yu D.O. on 02/25/2016 at 11:20 AM  Between 7am to 7pm - Pager - [phone]  After 7pm go to [email or web address] - password [email or web address]  And look for the night coverage person covering for me after hours  [facility] Office  [phone]

## 2016-02-25 NOTE — Progress Notes (Addendum)
RN paged NP because pt's BP was in the 80s and he was getting sleepy and "feeling bad" (weak). Attending wanted to give pt blood today but he refused. Tonight, he agreed to be transfused but then told RN he wanted to discuss this first. Questions were asked and answered. Risks vs benefits discussed and pt continues to refuse treatment at present and will talk more about this to Dr. in am. Explained that IVF will be used to help maintain safe BP but may become necessary to use blood if life threatening circumstances become evident.  KJKG, NP [facility] Called by RN stating she was attempting to change his right leg dressing and pt became nauseated. When pt turned sideways to lean over bed to vomit, he went unresponsive for a few seconds and upon awakening neuro was at baseline. NP to bedside.  S: Pt wants to be assured 100% that he will not "get anything" from the blood. It has to be "clean" he says. Relates nausea and dizziness when being turned in bed. No hx seizures. Wants to eat and get something to make him have a BM.  O: Obese fairly well appearing AAM in NAD. He is alert and oriented. No focal neuro deficits. Speech is fluent, not slurred, and appropriate. MOE x 4 spontaneously. Follows commands. He is a Biomedical scientist. A/P: 1. Hypotension (orthostatic) secondary to anemia and blood loss-pt refused blood earlier. NP again reviewed need for blood and how lack of volume and oxygen of blood can cause him to pass out (which he did), have even lower BP, and even die. NP related that there was no "100%" guarantee of blood free of disease or infection, but at this time, the risks of not getting it outweigh the risks of receiving it. NP related that a blood reaction/allergy is more likely than contracting a disease from a transfusion. Reassured pt that our procedures for screening blood of "diseases" is very stringent these days. He then agrees to receive transfusions. I discussed all this with his son at  bedside as well.  At present, stat CBC has been sent and we will cycle H/H every 6 hours. Transfuse 2 Units now and more as needed. Finish bolus which will make a total of 1.5 liters. BP 90s at present, but suspect with motion/turning it fell lower than this.  2. Acute blood loss from right lower extremity wound. Looks like about 100cc of blood on dressing supplies. Has a wound vac on one portion of leg but refused wound vac on other wound on same leg. Is slated for surgery this am to attempt to close/graft wound by Dr. Arbie Cookey. See #1. 3. Constipation-pt obsessed with bowels at present time. Explained to pt that we recognize this issue, but getting his BP higher takes priority at this moment. Assured pt that he could have a suppository later after stable. Pt told RN that he would just "dig" the stool out. Pt educated that he is on a blood thinner and that manual evacuation is not recommended due to risks of bleeding. He becomes beligerant with further discussion and states he "doesn't want to speak of this anymore, I will just take care of this myself, don't worry about me, leave me alone."  4. Mural Thrombus-on Heparin, this can not be stopped at this time.  5. Hx subendocardial MI-explained need to keep his Hgb a bit higher due to stress on heart caused by hypovolemia.  6. Syncope-believe secondary to orthostasis. RN stated he had some shaking in  UEs. However, do not suspect a seizure as pt was not post ictal and his mental/neuro status was immediately back to baseline with no sequelae of neuro issues on post-syncope episode exam. Plan: Replete volume/blood loss with transfusions. Follow H/H carefully. Change leg dsg when stable. Will assist with BM when stable. Follow BP closely. Should he have further neurological changes or ? episodes affecting mental status, will consider CT head.  If further hypotensive symptoms, may need PCCM consult and/or transfer to ICU.  Jimmye Norman, NP [facility] Update: Hgb down to 4.7. Total of 4 units to be transfused. Pt spiked a fever. WBCC up to 12. Check blood cx x 2, urinalysis, CXR, LA, add diff to CBC, and give 500cc bolus. Will start blood even with fever because fever started prior to transfusion. Tylenol prn. Further plan depends on pt's response to blood tranfusions and Hgb levels.  KJKG, NP [facility] Update: Pt's BP hovering around 100-105. T 102.5 after 2nd unit but had temp prior to starting the first unit. Pt needs Tylenol and Lasix now, so will hold 3rd unit for now, recheck temp in 45 mins and restart blood then if appropriate. He has no other signs of transfusion reaction. Will let attending know at 0700. KJKG, NP [facility]

## 2016-02-25 NOTE — Progress Notes (Signed)
ANTICOAGULATION CONSULT NOTE - Follow Up Consult  Pharmacy Consult for Heparin  Indication: Subacute MI/vascular occlusion  No Known Allergies  Patient Measurements: Height: 5\' 8"  (172.7 cm) Weight: 200 lb 13.4 oz (91.1 kg) IBW/kg (Calculated) : 68.4  Heparin dosing wt: 87 kg  Vital Signs: Temp: 97.4 F (36.3 C) (06/29 0922) Temp Source: Oral (06/29 0922) BP: 107/66 mmHg (06/29 1000) Pulse Rate: 92 (06/29 1000)  Labs:  Recent Labs  02/23/16 0339 02/23/16 1325  02/24/16 0320 02/24/16 0325 02/24/16 0940 02/25/16 0348  HGB 9.4* 8.8*  --  8.8*  --   --  7.8*  HCT 27.0* 25.3*  --  26.4*  --   --  22.7*  PLT 178 197  --  198  --   --  265  HEPARINUNFRC 0.18*  --   < >  --  0.29* 0.48 0.62  CREATININE 0.97  --   --   --   --   --  0.88  < > = values in this interval not displayed.  Estimated Creatinine Clearance: 102.7 mL/min (by C-G formula based on Cr of 0.88).  . sodium chloride 75 mL/hr at 02/25/16 0358  . heparin 1,350 Units/hr (02/25/16 0540)   Assessment: 55 yo male with LV thrombus and R femoral embolism s/p embolectomy on 6/25 and now s/p hematoma evacuation. Heparin level is at goal. Plans noted for OR on Friday.  Wound continuing to bleed per MD note.  Hgb declining, planning to transfuse.  -HL= 0.62 on heparin 1350 units/hr.  Goal of Therapy:  Heparin level 0.3-0.7 units/ml, low end for now with calf hematoma Monitor platelets by anticoagulation protocol: Yes   Plan:  -Continue IV heparin at current rate. -F/u plans for oral anticoagulation eventually. -Daily heparin level and CBC.  Tad Moore, BCPS  Clinical Pharmacist Pager [phone]  02/25/2016 10:36 AM

## 2016-02-25 NOTE — Care Management Note (Signed)
Case Management Note  Patient Details  Name: Nygil Luttrull MRN: [id number] Date of Birth: 02/22/1961  Subjective/Objective:        Adm w lv vent thrombus, leg wound            Action/Plan: lives w wife, has sched appt w pcp but has not seen   Expected Discharge Date:                  Expected Discharge Plan:  Home w Home Health Services  In-House Referral:     Discharge planning Services  CM Consult, [facility]igent [facility]  Post Acute Care Choice:    Choice offered to:     DME Arranged:    DME Agency:     HH Arranged:    HH Agency:     Status of Service:     If discussed at [employer], dates discussed:    Additional Comments: gave pt inform on clinics in [place] States [place] near [address] area.  Hanley Hays, RN 02/25/2016, 10:31 AM

## 2016-02-25 NOTE — Progress Notes (Signed)
PT Cancellation Note  Patient Details Name: Lynkin Torrente MRN: [id number] DOB: [birth year]   Cancelled Treatment:    Reason Eval/Treat Not Completed: Patient at procedure or test/unavailable;Medical issues which prohibited therapy   Nursing preparing to change Rt leg lateral dressing due to incr bleeding. Noted plans for OR tomorrow. Please clarify when OK to proceed with activity post-op.    Chayna Surratt 02/25/2016, 2:46 PM Pager [phone]

## 2016-02-26 ENCOUNTER — Inpatient Hospital Stay (HOSPITAL_COMMUNITY): Payer: Self-pay | Admitting: Anesthesiology

## 2016-02-26 ENCOUNTER — Inpatient Hospital Stay (HOSPITAL_COMMUNITY): Payer: Self-pay

## 2016-02-26 ENCOUNTER — Inpatient Hospital Stay (HOSPITAL_COMMUNITY): Payer: MEDICAID | Admitting: Anesthesiology

## 2016-02-26 ENCOUNTER — Encounter (HOSPITAL_COMMUNITY)
Admission: EM | Disposition: A | Discharge status: Home Health | Payer: Self-pay | Source: Home / Self Care | Attending: Internal Medicine

## 2016-02-26 DIAGNOSIS — I743 Embolism and thrombosis of arteries of the lower extremities: Secondary | ICD-10-CM

## 2016-02-26 HISTORY — PX: FASCIOTOMY CLOSURE: SHX5829

## 2016-02-26 HISTORY — PX: APPLICATION OF WOUND VAC: SHX5189

## 2016-02-26 LAB — CBC
HEMATOCRIT: 14 % — AB (ref 39.0–52.0)
Hemoglobin: 4.7 g/dL — CL (ref 13.0–17.0)
MCH: 29.2 pg (ref 26.0–34.0)
MCHC: 33.6 g/dL (ref 30.0–36.0)
MCV: 87 fL (ref 78.0–100.0)
PLATELETS: 304 10*3/uL (ref 150–400)
RBC: 1.61 MIL/uL — ABNORMAL LOW (ref 4.22–5.81)
RDW: 12.5 % (ref 11.5–15.5)
WBC: 12.5 10*3/uL — ABNORMAL HIGH (ref 4.0–10.5)

## 2016-02-26 LAB — PREPARE RBC (CROSSMATCH)

## 2016-02-26 LAB — CBC WITH DIFFERENTIAL/PLATELET
BASOS ABS: 0 10*3/uL (ref 0.0–0.1)
Basophils Relative: 0 %
Eosinophils Absolute: 0 10*3/uL (ref 0.0–0.7)
Eosinophils Relative: 0 %
HCT: 18.7 % — ABNORMAL LOW (ref 39.0–52.0)
HEMOGLOBIN: 6.3 g/dL — AB (ref 13.0–17.0)
LYMPHS PCT: 27 %
Lymphs Abs: 3.5 10*3/uL (ref 0.7–4.0)
MCH: 29.7 pg (ref 26.0–34.0)
MCHC: 33.7 g/dL (ref 30.0–36.0)
MCV: 88.2 fL (ref 78.0–100.0)
MONOS PCT: 7 %
Monocytes Absolute: 0.9 10*3/uL (ref 0.1–1.0)
NEUTROS PCT: 66 %
Neutro Abs: 8.5 10*3/uL — ABNORMAL HIGH (ref 1.7–7.7)
Platelets: 223 10*3/uL (ref 150–400)
RBC: 2.12 MIL/uL — AB (ref 4.22–5.81)
RDW: 13.4 % (ref 11.5–15.5)
WBC: 12.9 10*3/uL — AB (ref 4.0–10.5)

## 2016-02-26 LAB — GLUCOSE, CAPILLARY
GLUCOSE-CAPILLARY: 189 mg/dL — AB (ref 65–99)
GLUCOSE-CAPILLARY: 192 mg/dL — AB (ref 65–99)
GLUCOSE-CAPILLARY: 216 mg/dL — AB (ref 65–99)
GLUCOSE-CAPILLARY: 265 mg/dL — AB (ref 65–99)
Glucose-Capillary: 191 mg/dL — ABNORMAL HIGH (ref 65–99)
Glucose-Capillary: 227 mg/dL — ABNORMAL HIGH (ref 65–99)
Glucose-Capillary: 286 mg/dL — ABNORMAL HIGH (ref 65–99)

## 2016-02-26 LAB — HEPARIN LEVEL (UNFRACTIONATED)
HEPARIN UNFRACTIONATED: 0.69 [IU]/mL (ref 0.30–0.70)
HEPARIN UNFRACTIONATED: 0.71 [IU]/mL — AB (ref 0.30–0.70)

## 2016-02-26 LAB — URINALYSIS, ROUTINE W REFLEX MICROSCOPIC
BILIRUBIN URINE: NEGATIVE
Glucose, UA: 500 mg/dL — AB
Hgb urine dipstick: NEGATIVE
Ketones, ur: NEGATIVE mg/dL
Leukocytes, UA: NEGATIVE
NITRITE: NEGATIVE
Protein, ur: NEGATIVE mg/dL
SPECIFIC GRAVITY, URINE: 1.014 (ref 1.005–1.030)
pH: 6 (ref 5.0–8.0)

## 2016-02-26 LAB — BASIC METABOLIC PANEL
Anion gap: 6 (ref 5–15)
BUN: 12 mg/dL (ref 6–20)
CALCIUM: 7 mg/dL — AB (ref 8.9–10.3)
CO2: 22 mmol/L (ref 22–32)
CREATININE: 0.89 mg/dL (ref 0.61–1.24)
Chloride: 104 mmol/L (ref 101–111)
GFR calc Af Amer: >60 mL/min (ref >60)
GLUCOSE: 204 mg/dL — AB (ref 65–99)
Potassium: 4.2 mmol/L (ref 3.5–5.1)
Sodium: 132 mmol/L — ABNORMAL LOW (ref 135–145)

## 2016-02-26 LAB — HEMOGLOBIN AND HEMATOCRIT, BLOOD
HEMATOCRIT: 14.2 % — AB (ref 39.0–52.0)
HEMATOCRIT: 30.5 % — AB (ref 39.0–52.0)
HEMOGLOBIN: 10.3 g/dL — AB (ref 13.0–17.0)
Hemoglobin: 4.7 g/dL — CL (ref 13.0–17.0)

## 2016-02-26 LAB — LACTIC ACID, PLASMA: LACTIC ACID, VENOUS: 1.6 mmol/L (ref 0.5–1.9)

## 2016-02-26 SURGERY — FASCIOTOMY CLOSURE
Anesthesia: General | Site: Leg Lower | Laterality: Right

## 2016-02-26 MED ORDER — ROCURONIUM BROMIDE 50 MG/5ML IV SOLN
INTRAVENOUS | Status: AC
  Filled 2016-02-26: qty 1

## 2016-02-26 MED ORDER — FENTANYL CITRATE (PF) 250 MCG/5ML IJ SOLN
INTRAMUSCULAR | Status: AC
  Filled 2016-02-26: qty 5

## 2016-02-26 MED ORDER — ROCURONIUM BROMIDE 100 MG/10ML IV SOLN
INTRAVENOUS | Status: DC | PRN
  Administered 2016-02-26: 40 mg via INTRAVENOUS

## 2016-02-26 MED ORDER — HYDROMORPHONE HCL 1 MG/ML IJ SOLN
INTRAMUSCULAR | Status: AC
  Administered 2016-02-26: 0.5 mg via INTRAVENOUS
  Filled 2016-02-26: qty 1

## 2016-02-26 MED ORDER — SODIUM CHLORIDE 0.9 % IV BOLUS (SEPSIS)
1000.0000 mL | Freq: Once | INTRAVENOUS | Status: AC
  Administered 2016-02-26: 1000 mL via INTRAVENOUS

## 2016-02-26 MED ORDER — MIDAZOLAM HCL 2 MG/2ML IJ SOLN
INTRAMUSCULAR | Status: AC
  Filled 2016-02-26: qty 2

## 2016-02-26 MED ORDER — ONDANSETRON HCL 4 MG/2ML IJ SOLN
INTRAMUSCULAR | Status: DC | PRN
  Administered 2016-02-26: 4 mg via INTRAVENOUS

## 2016-02-26 MED ORDER — LACTATED RINGERS IV SOLN
INTRAVENOUS | Status: DC | PRN
  Administered 2016-02-26: 11:00:00 via INTRAVENOUS

## 2016-02-26 MED ORDER — SODIUM CHLORIDE 0.9 % IV SOLN: Freq: Once | INTRAVENOUS | Status: DC

## 2016-02-26 MED ORDER — FUROSEMIDE 10 MG/ML IJ SOLN
20.0000 mg | Freq: Once | INTRAMUSCULAR | Status: AC
  Administered 2016-02-27: 20 mg via INTRAVENOUS
  Filled 2016-02-26 (×2): qty 2

## 2016-02-26 MED ORDER — SODIUM CHLORIDE 0.9 % IV SOLN: Freq: Once | INTRAVENOUS | Status: AC

## 2016-02-26 MED ORDER — PROPOFOL 10 MG/ML IV BOLUS
INTRAVENOUS | Status: DC | PRN
  Administered 2016-02-26: 80 mg via INTRAVENOUS

## 2016-02-26 MED ORDER — FENTANYL CITRATE (PF) 100 MCG/2ML IJ SOLN
INTRAMUSCULAR | Status: DC | PRN
  Administered 2016-02-26 (×3): 50 ug via INTRAVENOUS

## 2016-02-26 MED ORDER — FUROSEMIDE 10 MG/ML IJ SOLN
20.0000 mg | Freq: Once | INTRAMUSCULAR | Status: AC
  Administered 2016-02-26: 20 mg via INTRAVENOUS
  Filled 2016-02-26: qty 2

## 2016-02-26 MED ORDER — SODIUM CHLORIDE 0.9 % IV BOLUS (SEPSIS)
500.0000 mL | Freq: Once | INTRAVENOUS | Status: AC
  Administered 2016-02-26: 500 mL via INTRAVENOUS

## 2016-02-26 MED ORDER — 0.9 % SODIUM CHLORIDE (POUR BTL) OPTIME
TOPICAL | Status: DC | PRN
  Administered 2016-02-26: 1000 mL

## 2016-02-26 MED ORDER — LIDOCAINE 2% (20 MG/ML) 5 ML SYRINGE
INTRAMUSCULAR | Status: AC
Start: 2016-02-26 — End: 2016-02-26
  Filled 2016-02-26: qty 5

## 2016-02-26 MED ORDER — INSULIN GLARGINE 100 UNIT/ML ~~LOC~~ SOLN
20.0000 [IU] | Freq: Every day | SUBCUTANEOUS | Status: DC
  Administered 2016-02-26 – 2016-03-15 (×17): 20 [IU] via SUBCUTANEOUS
  Filled 2016-02-26 (×22): qty 0.2

## 2016-02-26 MED ORDER — HEPARIN (PORCINE) IN NACL 100-0.45 UNIT/ML-% IJ SOLN
950.0000 [IU]/h | INTRAMUSCULAR | Status: DC
  Administered 2016-02-26 – 2016-02-28 (×3): 1100 [IU]/h via INTRAVENOUS
  Administered 2016-03-02: 950 [IU]/h via INTRAVENOUS
  Filled 2016-02-26 (×7): qty 250

## 2016-02-26 MED ORDER — LACTATED RINGERS IV SOLN
INTRAVENOUS | Status: DC
  Administered 2016-02-26: 10:00:00 via INTRAVENOUS

## 2016-02-26 MED ORDER — EPHEDRINE 5 MG/ML INJ
INTRAVENOUS | Status: AC
  Filled 2016-02-26: qty 10

## 2016-02-26 MED ORDER — HYDROMORPHONE HCL 1 MG/ML IJ SOLN
0.2500 mg | INTRAMUSCULAR | Status: DC | PRN
  Administered 2016-02-26 (×2): 0.5 mg via INTRAVENOUS

## 2016-02-26 MED ORDER — PROPOFOL 10 MG/ML IV BOLUS
INTRAVENOUS | Status: AC
  Filled 2016-02-26: qty 20

## 2016-02-26 MED ORDER — PHENYLEPHRINE 40 MCG/ML (10ML) SYRINGE FOR IV PUSH (FOR BLOOD PRESSURE SUPPORT)
PREFILLED_SYRINGE | INTRAVENOUS | Status: AC
  Filled 2016-02-26: qty 10

## 2016-02-26 MED ORDER — SUGAMMADEX SODIUM 200 MG/2ML IV SOLN
INTRAVENOUS | Status: DC | PRN
  Administered 2016-02-26: 200 mg via INTRAVENOUS

## 2016-02-26 MED ORDER — INSULIN ASPART 100 UNIT/ML ~~LOC~~ SOLN
6.0000 [IU] | Freq: Three times a day (TID) | SUBCUTANEOUS | Status: DC
  Administered 2016-02-27 – 2016-03-16 (×32): 6 [IU] via SUBCUTANEOUS

## 2016-02-26 SURGICAL SUPPLY — 48 items
BAG ISOLATION DRAPE 18X18 (DRAPES) ×1 IMPLANT
BANDAGE ELASTIC 4 VELCRO ST LF (GAUZE/BANDAGES/DRESSINGS) IMPLANT
BANDAGE ELASTIC 6 VELCRO ST LF (GAUZE/BANDAGES/DRESSINGS) IMPLANT
BNDG GAUZE ELAST 4 BULKY (GAUZE/BANDAGES/DRESSINGS) IMPLANT
CANISTER SUCTION 2500CC (MISCELLANEOUS) ×3 IMPLANT
CANISTER WOUND CARE 500ML ATS (WOUND CARE) ×3 IMPLANT
CLIP LIGATING EXTRA MED SLVR (CLIP) ×3 IMPLANT
CLIP LIGATING EXTRA SM BLUE (MISCELLANEOUS) ×3 IMPLANT
COVER SURGICAL LIGHT HANDLE (MISCELLANEOUS) ×3 IMPLANT
DRAPE INCISE IOBAN 66X45 STRL (DRAPES) ×3 IMPLANT
DRAPE ISOLATION BAG 18X18 (DRAPES) ×2
DRAPE U-SHAPE 47X51 STRL (DRAPES) IMPLANT
DRAPE U-SHAPE 76X120 STRL (DRAPES) IMPLANT
DRSG MEPITEL 3X4 ME34 (GAUZE/BANDAGES/DRESSINGS) ×3 IMPLANT
DRSG MEPITEL 4X7.2 (GAUZE/BANDAGES/DRESSINGS) ×3 IMPLANT
DRSG VAC ATS LRG SENSATRAC (GAUZE/BANDAGES/DRESSINGS) ×3 IMPLANT
ELECT REM PT RETURN 9FT ADLT (ELECTROSURGICAL) ×3
ELECTRODE REM PT RTRN 9FT ADLT (ELECTROSURGICAL) ×1 IMPLANT
GAUZE SPONGE 4X4 12PLY STRL (GAUZE/BANDAGES/DRESSINGS) ×3 IMPLANT
GLOVE BIO SURGEON STRL SZ 6.5 (GLOVE) ×2 IMPLANT
GLOVE BIO SURGEONS STRL SZ 6.5 (GLOVE) ×1
GLOVE BIOGEL PI IND STRL 6.5 (GLOVE) ×2 IMPLANT
GLOVE BIOGEL PI IND STRL 7.0 (GLOVE) ×2 IMPLANT
GLOVE BIOGEL PI IND STRL 8 (GLOVE) ×1 IMPLANT
GLOVE BIOGEL PI INDICATOR 6.5 (GLOVE) ×4
GLOVE BIOGEL PI INDICATOR 7.0 (GLOVE) ×4
GLOVE BIOGEL PI INDICATOR 8 (GLOVE) ×2
GLOVE ECLIPSE 7.5 STRL STRAW (GLOVE) ×3 IMPLANT
GLOVE SS BIOGEL STRL SZ 7.5 (GLOVE) ×1 IMPLANT
GLOVE SUPERSENSE BIOGEL SZ 7.5 (GLOVE) ×2
GOWN STRL REUS W/ TWL LRG LVL3 (GOWN DISPOSABLE) ×3 IMPLANT
GOWN STRL REUS W/TWL LRG LVL3 (GOWN DISPOSABLE) ×6
GOWN STRL REUS W/TWL XL LVL3 (GOWN DISPOSABLE) ×3 IMPLANT
KIT BASIN OR (CUSTOM PROCEDURE TRAY) ×3 IMPLANT
KIT ROOM TURNOVER OR (KITS) ×3 IMPLANT
NS IRRIG 1000ML POUR BTL (IV SOLUTION) ×3 IMPLANT
PACK GENERAL/GYN (CUSTOM PROCEDURE TRAY) ×3 IMPLANT
PACK UNIVERSAL I (CUSTOM PROCEDURE TRAY) ×3 IMPLANT
PAD ARMBOARD 7.5X6 YLW CONV (MISCELLANEOUS) ×6 IMPLANT
SPONGE GAUZE 4X4 12PLY STER LF (GAUZE/BANDAGES/DRESSINGS) ×3 IMPLANT
STAPLER VISISTAT 35W (STAPLE) ×3 IMPLANT
SUT ETHILON 3 0 PS 1 (SUTURE) ×21 IMPLANT
SUT PROLENE 6 0 BV (SUTURE) IMPLANT
SUT VIC AB 2-0 CTX 36 (SUTURE) IMPLANT
SUT VIC AB 3-0 SH 27 (SUTURE)
SUT VIC AB 3-0 SH 27X BRD (SUTURE) IMPLANT
TAPE CLOTH SURG 4X10 WHT LF (GAUZE/BANDAGES/DRESSINGS) ×3 IMPLANT
WATER STERILE IRR 1000ML POUR (IV SOLUTION) ×3 IMPLANT

## 2016-02-26 NOTE — Progress Notes (Signed)
Notified Maren Reamer, NP of patients temp of 102.5 post blood transfusion. Was ordered to re check temp 45 mins after giving tylenol and to let the attending know the results. The attending will decide on proceeding with the remaining 2 units of PRBC ordered. Will continue to monitor patient.

## 2016-02-26 NOTE — H&P (View-Only) (Signed)
Subjective: Interval History: none.. Had some difficulty with VAC dressing on the lateral aspect of his right leg. Patient is very upset about this. Again very concerned.  Objective: Vital signs in last 24 hours: Temp:  [97.4 F (36.3 C)-99.2 F (37.3 C)] 97.4 F (36.3 C) (06/29 0922) Pulse Rate:  [79-109] 92 (06/29 1000) Resp:  [12-26] 16 (06/29 1000) BP: (88-149)/(51-87) 107/66 mmHg (06/29 1000) SpO2:  [98 %-100 %] 100 % (06/29 1000) Weight:  [200 lb 13.4 oz (91.1 kg)] 200 lb 13.4 oz (91.1 kg) (06/29 0500)  Intake/Output from previous day: 06/28 0701 - 06/29 0700 In: 2242.5 [P.O.:120; I.V.:2122.5] Out: 2345 [Urine:1325; Drains:1020] Intake/Output this shift: Total I/O In: 267 [I.V.:267] Out: 2100 [Urine:2100]  Right groin without hematoma. 3+ dorsalis pedis pulse on the right. Medial VAC wound intact. Lateral not keeping a seal and some blood under the VAC dressing.  Lab Results:  Recent Labs  02/24/16 0320 02/25/16 0348  WBC 9.3 9.6  HGB 8.8* 7.8*  HCT 26.4* 22.7*  PLT 198 265   BMET  Recent Labs  02/23/16 0339 02/25/16 0348  NA 132* 133*  K 3.5 4.2  CL 102 102  CO2 24 26  GLUCOSE 275* 234*  BUN 10 5*  CREATININE 0.97 0.88  CALCIUM 7.5* 7.6*    Studies/Results: Dg Chest 2 View  02/21/2016  CLINICAL DATA:  Left lower extremity numbness and pain. EXAM: CHEST  2 VIEW COMPARISON:  None. FINDINGS: Normal heart size. Low lung volumes. Top-normal heart size. Mediastinal contour is within normal limits. No pneumothorax. No pleural effusion. Lungs appear clear, with no acute consolidative airspace disease and no pulmonary edema. IMPRESSION: Low lung volumes.  No active disease in the chest. Electronically Signed   By: Delbert Phenix M.D.   On: 02/21/2016 11:27   Ct Angio Ao+bifem W &/or Wo Contrast  02/21/2016  CLINICAL DATA:  Right lower extremity pain with diminished pulses and numbness. EXAM: CT ANGIOGRAPHY OF ABDOMINAL AORTA WITH ILIOFEMORAL RUNOFF TECHNIQUE:  Multidetector CT imaging of the abdomen, pelvis and lower extremities was performed using the standard protocol during bolus administration of intravenous contrast. Multiplanar CT image reconstructions and MIPs were obtained to evaluate the vascular anatomy. CONTRAST:  100 mL Isovue 370 IV COMPARISON:  None. FINDINGS: Aorta: Normally patent abdominal aorta without evidence of atherosclerosis, aneurysm or dissection. Visceral arteries are widely patent including the celiac axis, superior mesenteric artery, inferior mesenteric artery and bilateral single renal arteries. No embolic occlusions identified in the abdomen or pelvis. Right Lower Extremity: Widely patent right common, external and internal iliac arteries. The common femoral artery is normally patent up to its bifurcation. At the femoral bifurcation, there is nearly occlusive thrombus identified extending into both proximal profunda femoral and superficial femoral artery trunks. Contrast flow is seen beyond the thrombus and into normal distal profunda femoral branches. The SFA is open, but demonstrates some anterior mural thrombus. In the absence of significant atherosclerosis, this may represent some adherent thrombus from recent/prior embolic event. Anterior mural thrombus continues into the popliteal artery above the knee. Below the knee, there is only faint opacification of the distal popliteal artery and no visualized opacification of the tibial arteries. This is likely due to slow flow. However, component of thrombus in the tibial arteries cannot be entirely excluded. Left Lower Extremity: Left-sided arterial supply is completely normal including the common femoral artery, profunda femoral artery, superficial femoral artery, popliteal artery and tibial arteries. Or there is some narrowing of the popliteal artery just  above the knee joint of approximately 40-50% is associated with a slightly tortuous/ course. No plaque is identified at this level. A  component of incidental popliteal entrapment cannot be excluded. Review of the MIP images confirms the above findings. Lower chest:  Bibasilar atelectasis.  No pleural effusions. Hepatobiliary: Unremarkable arterial phase imaging of the liver and gallbladder. Pancreas: Unremarkable appearance. Spleen: Normal appearance of spleen. Adrenals/Urinary Tract: Normal adrenal glands and kidneys. No renal infarcts identified. Stomach/Bowel: Normal appearance without evidence to suggest mesenteric ischemia or inflammation. No obstruction. Normal appendix. No free air, free fluid or abscess. Lymphatic: No enlarged lymph nodes identified. Reproductive: Negative. Other: No hernias. Musculoskeletal: Bony structures are normal. IMPRESSION: 1. Embolic thrombus at the bifurcation of the right common femoral artery with thrombus extending into proximal profunda femoral and superficial femoral arteries. Thrombus is nearly occlusive but contrast is seen to flow around the thrombus into the distal vessels. There is also a component of probable anterior mural thrombus in the SFA and popliteal arteries which may be consistent with additional adherent thrombus. Tibial arteries are not adequately opacified on the right, most likely secondary to slow flow. Distal thrombus cannot be excluded. Vascular Surgical consultation is recommended. 2. Incidental finding of a mildly tortuous course of the popliteal artery on the left associated with some narrowing just above the knee joint. This narrowing does not appear atherosclerotic and there may be an incidental component of popliteal entrapment syndrome on the left. These results were called by telephone at the time of interpretation on 02/21/2016 at 2:55 pm to Dr. Zadie Rhine , who verbally acknowledged these results. Electronically Signed   By: Irish Lack M.D.   On: 02/21/2016 14:56   Anti-infectives: Anti-infectives    Start     Dose/Rate Route Frequency Ordered Stop   02/26/16  0600  cefUROXime (ZINACEF) 1.5 g in dextrose 5 % 50 mL IVPB     1.5 g 100 mL/hr over 30 Minutes Intravenous On call to O.R. 02/25/16 1055 02/27/16 0559   02/22/16 0800  cefUROXime (ZINACEF) 1.5 g in dextrose 5 % 50 mL IVPB     1.5 g 100 mL/hr over 30 Minutes Intravenous Every 12 hours 02/21/16 2115 02/22/16 2140   02/22/16 0600  cefUROXime (ZINACEF) injection 1.5 g     1.5 g Intramuscular On call to O.R. 02/21/16 1523 02/21/16 1713      Assessment/Plan: s/p Procedure(s): EVACUATION HEMATOMA With Placement of Negative Pressure Dressing (Right) APPLICATION OF Negative Pressure Dressing Right Lower Leg (Right) Stable overall. Again discussed with the patient and his son present the fact that the malfunctioning back is not causing any danger. Explained that he could have ongoing normal saline wet-to-dry care only. Explained that the [facility] dressing typically can lessen the time required for closure. Will remove the lateral portion of the VAC dressing and dressed this with saline today. Will take to the operating room tomorrow and explained to the patient this would be mid morning around 9:00. Will closes much as possible of his skin without tension. Explain the objective is to decrease the amount of open area that we'll have to either close by secondary attention intention or have skin grafting. Patient understands and I will plan for surgery tomorrow.  Dr.Mikhail was present as well and we both discussed possibility for transfusion. Has had continued drop in his hemoglobin and hematocrit. Patient currently refusing consideration for transfusion. We will monitor this but may need to proceed with transfusion at some point.   LOS: 4  days   Gretta Began 02/25/2016, 10:56 AM

## 2016-02-26 NOTE — Progress Notes (Signed)
OT Cancellation Note  Patient Details Name: Troy Yu MRN: [id number] DOB: 06-01-1961   Cancelled Treatment:    Reason Eval/Treat Not Completed: Patient not medically ready (getting blood and with temperature) ot to follow and check back at more appropriate time.   Harolyn Rutherford  [phone] 02/26/2016, 7:35 AM

## 2016-02-26 NOTE — Progress Notes (Signed)
ANTICOAGULATION CONSULT NOTE - Follow Up Consult  Pharmacy Consult for Heparin  Indication: Subacute MI/vascular occlusion  No Known Allergies  Patient Measurements: Height: 5\' 8"  (172.7 cm) Weight: 227 lb 8.2 oz (103.2 kg) (bed weight with correct linen ) IBW/kg (Calculated) : 68.4  Heparin dosing wt: 87 kg  Vital Signs: Temp: 100.3 F (37.9 C) (06/30 0746) Temp Source: Oral (06/30 0746) BP: 90/52 mmHg (06/30 0746) Pulse Rate: 102 (06/30 0746)  Labs:  Recent Labs  02/24/16 0940 02/25/16 0348 02/26/16 0005 02/26/16 0103 02/26/16 0812  HGB  --  7.8* 4.7* 4.7* 6.3*  6.6*  HCT  --  22.7* 14.2* 14.0* 18.7*  19.6*  PLT  --  265  --  304 PENDING  HEPARINUNFRC 0.48 0.62  --   --  0.69  CREATININE  --  0.88  --   --  0.89    Estimated Creatinine Clearance: 107.9 mL/min (by C-G formula based on Cr of 0.89).  . sodium chloride 75 mL/hr at 02/26/16 0230  . heparin    . lactated ringers 10 mL/hr at 02/26/16 4098   Assessment: 55 yo male with LV thrombus and R femoral embolism s/p embolectomy on 6/25 and now s/p hematoma evacuation. Heparin level is at goal.  Wound continuing to bleed per MD note, currently in OR.  Hgb declining, getting PRBCs.  -HL= 0.69 on heparin 1350 units/hr. (slightly above goal)  Goal of Therapy:  Heparin level 0.3-0.7 units/ml, low end for now with calf hematoma Monitor platelets by anticoagulation protocol: Yes   Plan:  -F/u heparin plans after OR - decrease gtt to 1250 units/hr if remains on heparin post-op. -Heparin level 6 hrs after gtt reduced -F/u plans for oral anticoagulation eventually. -Daily heparin level and CBC.  Tad Moore, BCPS  Clinical Pharmacist Pager [phone]  02/26/2016 10:44 AM

## 2016-02-26 NOTE — Progress Notes (Signed)
Patient continues to be uncooperative, belligerent, and argumentative with nurse. Right AC IV infiltrated, pt refused to let nurse remove IV. IV team consulted for new IV site and removal of infiltrated site.

## 2016-02-26 NOTE — Interval H&P Note (Signed)
History and Physical Interval Note:  02/26/2016 11:11 AM  Troy Yu  has presented today for surgery, with the diagnosis of Right lower extremity ischemia M62.89  The various methods of treatment have been discussed with the patient and family. After consideration of risks, benefits and other options for treatment, the patient has consented to  Procedure(s): FASCIOTOMY CLOSURE (Right) as a surgical intervention .  The patient's history has been reviewed, patient examined, no change in status, stable for surgery.  I have reviewed the patient's chart and labs.  Questions were answered to the patient's satisfaction.     Gretta Began

## 2016-02-26 NOTE — Progress Notes (Signed)
Troy Yu aware of Hgb 4.7. Patient receiving blood. Will monitor.

## 2016-02-26 NOTE — Progress Notes (Signed)
PROGRESS NOTE    Troy Yu  ZOX:[id number] DOB: 01/16/1961 DOA: 02/21/2016 PCP: No primary care provider on file.   Brief Narrative:  Troy Yu is an 55 y.o. male who was in his usual state of health until about 2 weeks ago when he had substernal chest pain that lasted about 48 hours.The pain went away after he vomited, and he attributed his symptoms to a prolonged religious fast. He then come to the [facility] ED on 02/21/16 with severe RLE pain associated with paresthesias. Upon presentation, he was found to have an abnormal EKG and elevated troponin. Cardiology was subsequently called to evaluate the patient, and he was felt to have suffered a subacute anterior MI. STAT echo was ordered and EDP advised to order an arterial duplex with recommendation to consult vascular surgery. IV heparin ordered and the patient was transferred to [facility] for further evaluation by vascular surgery, as it was felt that he had a right femoral embolism with concern for threatened limb. Dr Imogene Burn subsequently performed a right femoral embolectomy and right calf compartment fasciotomies x 4 . At 4:16 a.m. On 02/22/16, the patient complained of loss of sensation to RLE, and the patient reports he was in severe pain. He was given pain medication which did not alleviate his symptoms. He was re-evaluated by Dr. Imogene Burn at 7:33 a.m. Who removed the ACE wrap from his RLE which relieved the pain, and the patient was felt to have excessive bleeding in his calf related to full anti-coagulation. He was taken back to surgery for evacuation of hematoma and VAC dressings. Cardiologist continues to follow for management of his subacute MI.  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  -Wound VAC in place.  Lateral side not functioning properly.  Spoke with Dr. Arbie Cookey, plan for saline dressing today (wet-to-dry) -Case taken over by Dr. Arbie Cookey after patient requested a second opinion.  -Lipids well controlled, cholesterol 116, LDL 60.  -Likely for surgical revision today, however, pending as patient's hemoglobin dropped significantly overnight.  Symptomatic anemia/ Postoperative anemia/ Anemia secondary to blood loss -Overnight, hemglobin dropped to 4.7 -Patient became hypotensive, lethargic -Currently on heparin -was transfused 2 units PRBC -Hemoglobin currently 6.6 -Will order additional 2 units for transfusion -Continue to monitor CBC  ?Post-transfusion fever -Continue tylenol PRN -Continue to monitor closely  Syncope and orthostasis -Likely secondary to blood loss -Continue to monitor closely  Anterior subendocardial MI (HCC)/Abnormal EKG/Elevated troponin -On therapeutic dose heparin.  -Cardiology consulted and appreciated -Continue Inderal -ACE-I to be started when BP able to tolerate -Will eventually need cardiac cath  Newly diagnosed diabetes mellitus/Hyperglycemia -Hemoglobin A1c 9.2.  -Continue lantus, ISS and CBG monitoring.  -Diabetes coordinator consulted  Hyponatremia -Likely from hyperglycemia. Correct blood glucose and monitor. -Continue to monitor BMP  DVT Prophylaxis  heparin  Code Status: Full  Family Communication: Son at bedside  Disposition Plan: Admitted. Pending surgery today.  Consultants Cardiology  Vascular Surgery  Procedures  Right femoral embolectomy Right calf four compartment fasciotomies  Evacuation of hematoma from bilateral right calf fasciotomies Placement of negative pressure dressings x2  Antibiotics   Anti-infectives    Start     Dose/Rate Route Frequency Ordered Stop   02/26/16 0930  [MAR Hold]  cefUROXime (ZINACEF) 1.5 g in  dextrose 5 % 50 mL IVPB     (MAR Hold since 02/26/16 0928)   1.5 g 100 mL/hr over 30 Minutes Intravenous To ShortStay Surgical 02/25/16 1055 02/27/16 0930   02/22/16 0800  cefUROXime (ZINACEF) 1.5 g in dextrose 5 % 50 mL IVPB     1.5 g 100 mL/hr over 30 Minutes Intravenous Every 12 hours 02/21/16 2115 02/22/16 2140   02/22/16 0600  cefUROXime (ZINACEF) injection 1.5 g     1.5 g Intramuscular On call to O.R. 02/21/16 1523 02/21/16 1713      Subjective:   Troy Yu seen and examined today.  Patient states he is very sleepy this morning. Continues to have leg pain. Denies chest pain, shortness of breath, abdominal pain.   Objective:   Filed Vitals:   02/26/16 0500 02/26/16 0600 02/26/16 0614 02/26/16 0746  BP: 94/53 101/56 103/47 90/52  Pulse: 96 99  102  Temp:   102.5 F (39.2 C) 100.3 F (37.9 C)  TempSrc:    Oral  Resp: 22 24  25   Height:      Weight: 103.2 kg (227 lb 8.2 oz)     SpO2: 100% 100%  100%    Intake/Output Summary (Last 24 hours) at 02/26/16 1013 Last data filed at 02/26/16 0800  Gross per 24 hour  Intake 3642.7 ml  Output   1600 ml  Net 2042.7 ml   Filed Weights   02/23/16 0443 02/25/16 0500 02/26/16 0500  Weight: 93.7 kg (206 lb 9.1 oz) 91.1 kg (200 lb 13.4 oz) 103.2 kg (227 lb 8.2 oz)    Exam  General: Well developed, well nourished, no distress  HEENT: NCAT, mucous membranes moist.   Cardiovascular: S1 S2 auscultated, no murmurs, RRR  Respiratory: Clear to auscultation bilaterally   Abdomen: Soft, nontender, nondistended, + bowel sounds  Extremities: warm dry without cyanosis clubbing or edema. RLE wrapped, wound vac on medial aspect  Neuro: AAOx3, nonfocal  Psych: Appropriate   Data Reviewed: I have personally reviewed following labs and imaging studies  CBC:  Recent Labs Lab 02/21/16 0827  02/23/16 1325 02/24/16 0320 02/25/16 0348 02/26/16 0005 02/26/16 0103 02/26/16 0812  WBC 10.1  < > 8.3 9.3 9.6  --  12.5* PENDING   NEUTROABS 7.9*  --   --   --   --   --   --  PENDING  HGB 15.5  < > 8.8* 8.8* 7.8* 4.7* 4.7* 6.3*  6.6*  HCT 42.8  < > 25.3* 26.4* 22.7* 14.2* 14.0* 18.7*  19.6*  MCV 82.6  < > 84.6 84.3 85.3  --  87.0 88.2  PLT 250  < > 197 198 265  --  304 PENDING  < > = values in this interval not displayed. Basic Metabolic Panel:  Recent Labs Lab 02/21/16 0827 02/22/16 0400 02/23/16 0339 02/25/16 0348 02/26/16 0812  NA 132* 130* 132* 133* 132*  K 4.5 4.4 3.5 4.2 4.2  CL 100* 101 102 102 104  CO2 25 23 24 26 22   GLUCOSE 216* 303* 275* 234* 204*  BUN 14 12 10  5* 12  CREATININE 0.94 1.01 0.97 0.88 0.89  CALCIUM 8.6* 7.9* 7.5* 7.6* 7.0*   GFR: Estimated Creatinine Clearance: 107.9 mL/min (by C-G formula based on Cr of 0.89). Liver Function Tests:  Recent Labs Lab 02/23/16 0339  AST 15  ALT 17  ALKPHOS 35*  BILITOT 0.5  PROT 4.6*  ALBUMIN 2.1*   No results  for input(s): LIPASE, AMYLASE in the last 168 hours. No results for input(s): AMMONIA in the last 168 hours. Coagulation Profile:  Recent Labs Lab 02/21/16 1342  INR 1.11   Cardiac Enzymes:  Recent Labs Lab 02/21/16 0827 02/21/16 2026 02/22/16 0400  TROPONINI 0.84* 0.94* 0.85*   BNP (last 3 results) No results for input(s): PROBNP in the last 8760 hours. HbA1C: No results for input(s): HGBA1C in the last 72 hours. CBG:  Recent Labs Lab 02/25/16 1737 02/25/16 2115 02/25/16 2341 02/26/16 0745 02/26/16 1001  GLUCAP 217* 238* 265* 216* 191*   Lipid Profile: No results for input(s): CHOL, HDL, LDLCALC, TRIG, CHOLHDL, LDLDIRECT in the last 72 hours. Thyroid Function Tests: No results for input(s): TSH, T4TOTAL, FREET4, T3FREE, THYROIDAB in the last 72 hours. Anemia Panel: No results for input(s): VITAMINB12, FOLATE, FERRITIN, TIBC, IRON, RETICCTPCT in the last 72 hours. Urine analysis:    Component Value Date/Time   COLORURINE YELLOW 02/26/2016 0229   APPEARANCEUR CLEAR 02/26/2016 0229   LABSPEC 1.014  02/26/2016 0229   PHURINE 6.0 02/26/2016 0229   GLUCOSEU 500* 02/26/2016 0229   HGBUR NEGATIVE 02/26/2016 0229   BILIRUBINUR NEGATIVE 02/26/2016 0229   KETONESUR NEGATIVE 02/26/2016 0229   PROTEINUR NEGATIVE 02/26/2016 0229   NITRITE NEGATIVE 02/26/2016 0229   LEUKOCYTESUR NEGATIVE 02/26/2016 0229   Sepsis Labs: (procalcitonin:4,lacticidven:4)  ) Recent Results (from the past 240 hour(s))  MRSA PCR Screening     Status: None   Collection Time: 02/21/16 10:17 PM  Result Value Ref Range Status   MRSA by PCR NEGATIVE NEGATIVE Final    Comment:        The GeneXpert MRSA Assay (FDA approved for NASAL specimens only), is one component of a comprehensive MRSA colonization surveillance program. It is not intended to diagnose MRSA infection nor to guide or monitor treatment for MRSA infections.       Radiology Studies: Dg Chest Port 1 View  02/26/2016  CLINICAL DATA:  Fever, onset tonight. EXAM: PORTABLE CHEST 1 VIEW COMPARISON:  None. FINDINGS: A single AP portable view of the chest demonstrates no focal airspace consolidation or alveolar edema. The lungs are grossly clear. There is no large effusion or pneumothorax. Cardiac and mediastinal contours appear unremarkable. IMPRESSION: No acute findings Electronically Signed   By: Ellery Plunk M.D.   On: 02/26/2016 02:15     Scheduled Meds: . [MAR Hold] aspirin  325 mg Oral Daily  . [MAR Hold] atorvastatin  80 mg Oral q1800  . [MAR Hold] cefUROXime (ZINACEF)  IV  1.5 g Intravenous To SS-Surg  . [MAR Hold] docusate sodium  100 mg Oral Daily  . [MAR Hold] insulin aspart  0-15 Units Subcutaneous TID WC  . [MAR Hold] insulin aspart  0-5 Units Subcutaneous QHS  . [MAR Hold] insulin aspart  4 Units Subcutaneous TID WC  . [MAR Hold] insulin glargine  15 Units Subcutaneous QHS  . [MAR Hold] pantoprazole  40 mg Oral Daily  . [MAR Hold] propranolol  10 mg Oral TID   Continuous Infusions: . sodium chloride 75 mL/hr at  02/26/16 0230  . heparin 1,350 Units/hr (02/26/16 0800)  . lactated ringers 10 mL/hr at 02/26/16 0955     LOS: 5 days   Time Spent in minutes   30 minutes  Nickoli Bagheri D.O. on 02/26/2016 at 10:13 AM  Between 7am to 7pm - Pager - [phone]  After 7pm go to [email or web address] - password [email or web address]  And look for the night coverage person covering  for me after hours  [facility] Office  [phone]

## 2016-02-26 NOTE — Op Note (Signed)
    OPERATIVE REPORT  DATE OF SURGERY: 02/26/2016  PATIENT: Troy Yu, 55 y.o. male MRN: [id number]  DOB: Dec 27, 1960  PRE-OPERATIVE DIAGNOSIS: Open fasciotomies right lower extremity medial and lateral calf  POST-OPERATIVE DIAGNOSIS:  Same  PROCEDURE: Closure of lateral fasciotomy and VAC placement medial fasciotomy  SURGEON:  Gretta Began, M.D.  PHYSICIAN ASSISTANT: Ruel Favors [facility]  ANESTHESIA:  Gen.  EBL: Minimal ml  Total I/O In: 1868.5 [I.V.:863.5; Blood:1005] Out: 650 [Urine:600; Blood:50]  BLOOD ADMINISTERED: None  DRAINS: None  SPECIMEN: None  COUNTS CORRECT:  YES  PLAN OF CARE: PACU   PATIENT DISPOSITION:  PACU - hemodynamically stable  PROCEDURE DETAILS: Patient is status post right femoral embolectomy 5 days ago for acute embolus. He had fasciotomies that time. He did have some bleeding under this in the first day and was taken back to the operating room the following day for irrigation of the open wounds and extension of the skin incisions for the fasciotomy. He continues to have a good flow to his foot and a segment operating this time for attempted partial closure was fasciotomies. Stenosis at length with the patient and family prior. Explained that with retraction of skin with swelling would be unlikely to be able to get these completely closed. Explained that if we could at least partially close fasciotomies to be less for eventual closure by secondary extension or skin grafting. Also explained that we would be closing this with nylon sutures which would leave some scarring at the area of the fasciotomy closure.  Patient was taken to the operative placed supine position where the area the right  Draped in usual sterile fashion. Patient was fully heparinized due to his multiple left ventricular thrombus. He had some oozing from skin edges only on the right fasciotomy on the lateral aspect. Did not have any muscle bleeding or other bleeding of any  significance on the medial or lateral sides. Both medial and lateral sides were irrigated. The lateral fasciotomy incision was closed in its entirety with multiple 3-0 nylon mattress sutures. Staples were placed between the mattress sutures for good approximation of his wound edges. The defect in the right medial calf could not be closed without excessive tension. A very small portion of the upper edge of the wound was closed with interrupted 3-0 nylon sutures. A VAC dressing was placed in the remaining portion of the medial incision. The patient was referred to the recovery room stable condition   Gretta Began, M.D. 02/26/2016 1:53 PM

## 2016-02-26 NOTE — Progress Notes (Signed)
ANTICOAGULATION CONSULT NOTE - Follow Up Consult  Pharmacy Consult for Heparin  Indication: Subacute MI/vascular occlusion  No Known Allergies  Patient Measurements: Height: 5\' 8"  (172.7 cm) Weight: 227 lb 8.2 oz (103.2 kg) (bed weight with correct linen ) IBW/kg (Calculated) : 68.4  Heparin dosing wt: 87 kg  Vital Signs: Temp: 98.2 F (36.8 C) (06/30 1939) Temp Source: Oral (06/30 1939) BP: 125/72 mmHg (06/30 2000) Pulse Rate: 88 (06/30 2100)  Labs:  Recent Labs  02/25/16 0348  02/26/16 0103 02/26/16 2482 02/26/16 1325 02/26/16 2035  HGB 7.8*  < > 4.7* 6.3*  6.6* 10.3*  --   HCT 22.7*  < > 14.0* 18.7*  19.6* 30.5*  --   PLT 265  --  304 223  --   --   HEPARINUNFRC 0.62  --   --  0.69  --  0.71*  CREATININE 0.88  --   --  0.89  --   --   < > = values in this interval not displayed.  Estimated Creatinine Clearance: 107.9 mL/min (by C-G formula based on Cr of 0.89).  Medication Infusion: . sodium chloride 75 mL/hr at 02/26/16 0230  . heparin 1,250 Units/hr (02/26/16 2000)  . lactated ringers 10 mL/hr at 02/26/16 5003   Assessment: 55 yo male with LV thrombus and R femoral embolism s/p embolectomy on 6/25 and now s/p hematoma evacuation. Pt s/p closure of lateral fasciotomy and VAC placement to medial fasciotomy today.  HL is above goal at 0.71. Hb 10.3 >> 4.7 s/p 5 units PRBC, platelets are stable. Spoke with RN and wound VAC does have some drainage but not overtly bleeding.  Goal of Therapy:  Heparin level 0.3-0.7 units/ml, attempt middle range with bleeding risk Monitor platelets by anticoagulation protocol: Yes   Plan:  -Decrease heparin drip to 1100 units/hr -Check HL and CBC with am labs -F/u plans for oral anticoagulation eventually -Daily HL and CBC -Monitor for s/sx of bleeding   [facility], [address].D., BCPS Clinical Pharmacist Pager: [phone] 02/26/2016 9:27 PM

## 2016-02-26 NOTE — Progress Notes (Signed)
Blood pressure remains low (95/48), PO Propanolol not given preop.

## 2016-02-26 NOTE — Progress Notes (Signed)
Inpatient Diabetes Program Recommendations  AACE/ADA: New Consensus Statement on Inpatient Glycemic Control (2015)  Target Ranges:  Prepandial:   less than 140 mg/dL      Peak postprandial:   less than 180 mg/dL (1-2 hours)      Critically ill patients:  140 - 180 mg/dL  Results for Troy Yu, Troy Yu (MRN [id number]) as of 02/26/2016 08:43  Ref. Range 02/25/2016 07:14 02/25/2016 13:04 02/25/2016 17:37 02/25/2016 21:15 02/25/2016 23:41  Glucose-Capillary Latest Ref Range: 65-99 mg/dL 833 (H) 383 (H) 291 (H) 238 (H) 265 (H)    Inpatient Diabetes Program Recommendations:  Reviewed CBGs. Please consider increase in Lantus insulin to 20 units qd  And increase meal coverage to 6 units if eats > 50%.  Thank you, Billy Fischer. Alayne Estrella, RN, MSN, CDE Inpatient Glycemic Control Team Team Pager [phone] (8am-5pm) 02/26/2016 8:47 AM

## 2016-02-26 NOTE — Transfer of Care (Signed)
Immediate Anesthesia Transfer of Care Note  Patient: Troy Yu  Procedure(s) Performed: Procedure(s): RIGHT LATERAL LOWER LEG FASCIOTOMY CLOSURE (Right) APPLICATION OF WOUND VAC, RIGHT MEDIAL LOWER LEG FASCIOTOMY SITE (Right)  Patient Location: PACU  Anesthesia Type:General  Level of Consciousness: awake, oriented, sedated and patient cooperative  Airway & Oxygen Therapy: Patient Spontanous Breathing and Patient connected to face mask oxygen  Post-op Assessment: Report given to RN, Post -op Vital signs reviewed and stable, Patient moving all extremities and Patient moving all extremities X 4  Post vital signs: Reviewed and stable  Last Vitals:  Filed Vitals:   02/26/16 0614 02/26/16 0746  BP: 103/47 90/52  Pulse:  102  Temp: 39.2 C 37.9 C  Resp:  25    Last Pain:  Filed Vitals:   02/26/16 1308  PainSc: Asleep      Patients Stated Pain Goal: 2 (02/25/16 2123)  Complications: No apparent anesthesia complications

## 2016-02-26 NOTE — Anesthesia Preprocedure Evaluation (Signed)
Anesthesia Evaluation  Patient identified by MRN, date of birth, ID band Patient awake    Reviewed: Allergy & Precautions, H&P , NPO status , Patient's Chart, lab work & pertinent test results, reviewed documented beta blocker date and time   Airway Mallampati: III  TM Distance: >3 FB Neck ROM: Full    Dental no notable dental hx. (+) Teeth Intact, Dental Advisory Given   Pulmonary neg pulmonary ROS,    Pulmonary exam normal breath sounds clear to auscultation       Cardiovascular hypertension, On Home Beta Blockers + Past MI   Rhythm:Regular Rate:Normal     Neuro/Psych negative neurological ROS  negative psych ROS   GI/Hepatic negative GI ROS, Neg liver ROS,   Endo/Other  negative endocrine ROSdiabetes  Renal/GU negative Renal ROS  negative genitourinary   Musculoskeletal   Abdominal   Peds  Hematology negative hematology ROS (+)   Anesthesia Other Findings   Reproductive/Obstetrics negative OB ROS                             Anesthesia Physical Anesthesia Plan  ASA: III and emergent  Anesthesia Plan: General   Post-op Pain Management:    Induction: Intravenous  Airway Management Planned: Oral ETT  Additional Equipment: Arterial line  Intra-op Plan:   Post-operative Plan: Extubation in OR  Informed Consent: I have reviewed the patients History and Physical, chart, labs and discussed the procedure including the risks, benefits and alternatives for the proposed anesthesia with the patient or authorized representative who has indicated his/her understanding and acceptance.   Dental advisory given  Plan Discussed with: CRNA  Anesthesia Plan Comments:         Anesthesia Quick Evaluation

## 2016-02-26 NOTE — Progress Notes (Signed)
Report called to [phone].  

## 2016-02-26 NOTE — Anesthesia Procedure Notes (Signed)
Procedure Name: Intubation Date/Time: 02/26/2016 11:41 AM Performed by: Coralee Rud Pre-anesthesia Checklist: Patient identified, Emergency Drugs available, Suction available and Patient being monitored Patient Re-evaluated:Patient Re-evaluated prior to inductionOxygen Delivery Method: Circle system utilized Preoxygenation: Pre-oxygenation with 100% oxygen Intubation Type: IV induction Ventilation: Mask ventilation without difficulty Laryngoscope Size: Glidescope and 3 Grade View: Grade I Tube type: Oral Number of attempts: 1 Airway Equipment and Method: Stylet Placement Confirmation: ETT inserted through vocal cords under direct vision,  positive ETCO2 and breath sounds checked- equal and bilateral Secured at: 22 cm Tube secured with: Tape

## 2016-02-26 NOTE — Progress Notes (Signed)
First unit of PRBCs completed @ 0347. No s/sx of reaction.

## 2016-02-26 NOTE — Interval H&P Note (Signed)
History and Physical Interval Note:  02/26/2016 11:18 AM  Troy Yu  has presented today for surgery, with the diagnosis of Right lower extremity ischemia M62.89  The various methods of treatment have been discussed with the patient and family. After consideration of risks, benefits and other options for treatment, the patient has consented to  Procedure(s): FASCIOTOMY CLOSURE (Right) as a surgical intervention .  The patient's history has been reviewed, patient examined, no change in status, stable for surgery.  I have reviewed the patient's chart and labs.  Questions were answered to the patient's satisfaction.     Gretta Began

## 2016-02-26 NOTE — Anesthesia Postprocedure Evaluation (Signed)
Anesthesia Post Note  Patient: Troy Yu  Procedure(s) Performed: Procedure(s) (LRB): RIGHT LATERAL LOWER LEG FASCIOTOMY CLOSURE (Right) APPLICATION OF WOUND VAC, RIGHT MEDIAL LOWER LEG FASCIOTOMY SITE (Right)  Patient location during evaluation: PACU Anesthesia Type: General Level of consciousness: awake and alert Pain management: pain level controlled Vital Signs Assessment: post-procedure vital signs reviewed and stable Respiratory status: spontaneous breathing, nonlabored ventilation, respiratory function stable and patient connected to nasal cannula oxygen Cardiovascular status: blood pressure returned to baseline and stable Postop Assessment: no signs of nausea or vomiting Anesthetic complications: no    Last Vitals:  Filed Vitals:   02/26/16 1335 02/26/16 1340  BP: 148/95 143/93  Pulse: 89 88  Temp: 36.2 C   Resp: 16 16    Last Pain:  Filed Vitals:   02/26/16 1344  PainSc: 5                  Hamna Asa,W. EDMOND

## 2016-02-26 NOTE — Progress Notes (Signed)
Patient very argumentative with any type of care provided. Patient on bedpan trying to have bowel movement. C/o constipation. Explained to patient that he could receive a ducolux suppository after unit of blood started. Patient continues to state that he will not wait and he needs to get his stool out now. Patient proceeds to tell son to get some water bottles to assist him in evacuating his stool. Encouraged patient to not manual disimpact himself for he is on heparin and his hgb is very low. Patient  belligerent and uncooperative, he tells nurse and charge nurse to leave his room and not come back until he has evacuated his stool. Donnamarie Poag, NP aware.

## 2016-02-27 ENCOUNTER — Encounter (HOSPITAL_COMMUNITY): Payer: Self-pay | Admitting: Vascular Surgery

## 2016-02-27 DIAGNOSIS — R509 Fever, unspecified: Secondary | ICD-10-CM

## 2016-02-27 LAB — GLUCOSE, CAPILLARY
GLUCOSE-CAPILLARY: 207 mg/dL — AB (ref 65–99)
GLUCOSE-CAPILLARY: 291 mg/dL — AB (ref 65–99)
GLUCOSE-CAPILLARY: 256 mg/dL — AB (ref 65–99)
Glucose-Capillary: 170 mg/dL — ABNORMAL HIGH (ref 65–99)

## 2016-02-27 LAB — BASIC METABOLIC PANEL
Anion gap: 5 (ref 5–15)
BUN: 15 mg/dL (ref 6–20)
CALCIUM: 7 mg/dL — AB (ref 8.9–10.3)
CHLORIDE: 100 mmol/L — AB (ref 101–111)
CO2: 23 mmol/L (ref 22–32)
CREATININE: 1.01 mg/dL (ref 0.61–1.24)
GFR calc Af Amer: >60 mL/min (ref >60)
GFR calc non Af Amer: >60 mL/min (ref >60)
Glucose, Bld: 212 mg/dL — ABNORMAL HIGH (ref 65–99)
Potassium: 4.1 mmol/L (ref 3.5–5.1)
SODIUM: 128 mmol/L — AB (ref 135–145)

## 2016-02-27 LAB — CBC
HCT: 25.9 % — ABNORMAL LOW (ref 39.0–52.0)
HEMOGLOBIN: 9 g/dL — AB (ref 13.0–17.0)
MCH: 28.8 pg (ref 26.0–34.0)
MCHC: 34.7 g/dL (ref 30.0–36.0)
MCV: 82.7 fL (ref 78.0–100.0)
Platelets: 198 10*3/uL (ref 150–400)
RBC: 3.13 MIL/uL — ABNORMAL LOW (ref 4.22–5.81)
RDW: 14.7 % (ref 11.5–15.5)
WBC: 16.5 10*3/uL — ABNORMAL HIGH (ref 4.0–10.5)

## 2016-02-27 LAB — HEPARIN LEVEL (UNFRACTIONATED)
Heparin Unfractionated: 0.49 IU/mL (ref 0.30–0.70)
Heparin Unfractionated: 0.6 IU/mL (ref 0.30–0.70)

## 2016-02-27 NOTE — Progress Notes (Signed)
SUBJECTIVE: The patient is doing well today.  Continues to have improvement in his R leg.  At this time, he denies chest pain, shortness of breath, or any new concerns.  . sodium chloride   Intravenous Once  . aspirin  325 mg Oral Daily  . atorvastatin  80 mg Oral q1800  . docusate sodium  100 mg Oral Daily  . insulin aspart  0-15 Units Subcutaneous TID WC  . insulin aspart  0-5 Units Subcutaneous QHS  . insulin aspart  6 Units Subcutaneous TID WC  . insulin glargine  20 Units Subcutaneous QHS  . pantoprazole  40 mg Oral Daily  . propranolol  10 mg Oral TID   . sodium chloride 75 mL/hr at 02/26/16 0230  . heparin 1,100 Units/hr (02/26/16 2230)  . lactated ringers 10 mL/hr at 02/26/16 0955    OBJECTIVE: Physical Exam: Filed Vitals:   02/27/16 0400 02/27/16 0500 02/27/16 0600 02/27/16 0828  BP: 99/81   123/86  Pulse: 72 73 79 74  Temp:    98 F (36.7 C)  TempSrc:    Oral  Resp: 14 12 13 14   Height:      Weight:      SpO2: 98% 96% 98% 98%    Intake/Output Summary (Last 24 hours) at 02/27/16 1109 Last data filed at 02/27/16 0900  Gross per 24 hour  Intake 2157.82 ml  Output   1700 ml  Net 457.82 ml    Telemetry reveals sinus rhythm  GEN- The patient is ill appearing, alert and oriented x 3 today.   Head- normocephalic, atraumatic Eyes-  Sclera clear, conjunctiva pink Ears- hearing intact Oropharynx- clear Neck- supple,   Lungs- Clear to ausculation bilaterally, normal work of breathing Heart- Regular rate and rhythm, no murmurs, rubs or gallops, PMI not laterally displaced GI- soft, NT, ND, + BS Extremities- no clubbing, cyanosis, or edema Skin- R leg wound vac in place Psych- euthymic mood, full affect Neuro- strength and sensation are intact  LABS: Basic Metabolic Panel:  Recent Labs  45/03/88 0812 02/27/16 0330  NA 132* 128*  K 4.2 4.1  CL 104 100*  CO2 22 23  GLUCOSE 204* 212*  BUN 12 15  CREATININE 0.89 1.01  CALCIUM 7.0* 7.0*   Liver  Function Tests: No results for input(s): AST, ALT, ALKPHOS, BILITOT, PROT, ALBUMIN in the last 72 hours. No results for input(s): LIPASE, AMYLASE in the last 72 hours. CBC:  Recent Labs  02/26/16 0812 02/26/16 1325 02/27/16 0330  WBC 12.9*  --  16.5*  NEUTROABS 8.5*  --   --   HGB 6.3*  6.6* 10.3* 9.0*  HCT 18.7*  19.6* 30.5* 25.9*  MCV 88.2  --  82.7  PLT 223  --  198    ASSESSMENT AND PLAN:   1. Subacute anterior MI: likely occurred 2-3 weeks prior to arrival - Echo 02/21/2016 EF 30-35%, anteroapical infarct with large thrombus burden at apex, mobile and high embolic potential  - on heparin drip,  Would anticipate initiation of coumadin when ok with vascular surgery - once RLE healed will need further coronary evaluation.  Add ACE when BP allows  2. Ischemic leg - per vascular surgery  - on heparin drip  3. DM: per hospitalist  4. Worsening postop anemia:  S/p recent PRBCs.  May require additional PRBCs.  Will defer to primary team  Cardiology to see as needed over the weekend Please call with questions.  Hillis Range, MD 02/27/2016 11:09  AM

## 2016-02-27 NOTE — Progress Notes (Signed)
ANTICOAGULATION CONSULT NOTE - Follow Up Consult  Pharmacy Consult for Heparin  Indication: Subacute MI/vascular occlusion  No Known Allergies  Patient Measurements: Height: 5\' 8"  (172.7 cm) Weight: 230 lb 9.6 oz (104.6 kg) IBW/kg (Calculated) : 68.4  Heparin dosing wt: 87 kg  Vital Signs: Temp: 98 F (36.7 C) (07/01 0828) Temp Source: Oral (07/01 0828) BP: 123/86 mmHg (07/01 0828) Pulse Rate: 74 (07/01 0828)  Labs:  Recent Labs  02/25/16 0348  02/26/16 0103 02/26/16 1660 02/26/16 1325 02/26/16 2035 02/27/16 0330 02/27/16 1158  HGB 7.8*  < > 4.7* 6.3*  6.6* 10.3*  --  9.0*  --   HCT 22.7*  < > 14.0* 18.7*  19.6* 30.5*  --  25.9*  --   PLT 265  --  304 223  --   --  198  --   HEPARINUNFRC 0.62  --   --  0.69  --  0.71* 0.49 0.60  CREATININE 0.88  --   --  0.89  --   --  1.01  --   < > = values in this interval not displayed.  Estimated Creatinine Clearance: 95.8 mL/min (by C-G formula based on Cr of 1.01).  Medication Infusion: . sodium chloride 75 mL/hr at 02/26/16 0230  . heparin 1,100 Units/hr (02/26/16 2230)  . lactated ringers 10 mL/hr at 02/26/16 6301   Assessment: 55 yo male with LV thrombus and R femoral embolism s/p embolectomy on 6/25 and now s/p hematoma evacuation. Pt s/p closure of lateral fasciotomy and VAC placement to medial fasciotomy today.  HL is above goal at 0.71. Hb 10.3 >> 4.7 s/p 5 units PRBC > 9, platelets are stable. Spoke with RN and wound VAC does have some drainage but not overtly bleeding.  Goal of Therapy:  Heparin level 0.3-0.7 units/ml, attempt middle range with bleeding risk Monitor platelets by anticoagulation protocol: Yes   Plan:  -Continue IV heparin at current rate. -Check HL and CBC with am labs -F/u plans for oral anticoagulation eventually -Daily HL and CBC -Monitor for s/sx of bleeding  Tad Moore, BCPS  Clinical Pharmacist Pager [phone]  02/27/2016 1:29 PM

## 2016-02-27 NOTE — Progress Notes (Signed)
ANTICOAGULATION CONSULT NOTE - Follow Up Consult  Pharmacy Consult for heparin Indication: subacute MI/vascular occlusion  Labs:  Recent Labs  02/25/16 0348  02/26/16 0103 02/26/16 0812 02/26/16 1325 02/26/16 2035 02/27/16 0330  HGB 7.8*  < > 4.7* 6.3*  6.6* 10.3*  --  9.0*  HCT 22.7*  < > 14.0* 18.7*  19.6* 30.5*  --  25.9*  PLT 265  --  304 223  --   --  198  HEPARINUNFRC 0.62  --   --  0.69  --  0.71* 0.49  CREATININE 0.88  --   --  0.89  --   --  1.01  < > = values in this interval not displayed.   Assessment/Plan:  55yo male therapeutic on heparin after rate change. Will continue gtt at current rate and confirm stable with additional level.   Vernard Gambles, PharmD, BCPS  02/27/2016,4:19 AM

## 2016-02-27 NOTE — Progress Notes (Signed)
PROGRESS NOTE    Orban Mccrudden  HAF:[id number] DOB: Jul 24, 1961 DOA: 02/21/2016 PCP: No primary care provider on file.   Brief Narrative:  Troy Yu is an 55 y.o. male who was in his usual state of health until about 2 weeks ago when he had substernal chest pain that lasted about 48 hours.The pain went away after he vomited, and he attributed his symptoms to a prolonged religious fast. He then come to the [facility] ED on 02/21/16 with severe RLE pain associated with paresthesias. Upon presentation, he was found to have an abnormal EKG and elevated troponin. Cardiology was subsequently called to evaluate the patient, and he was felt to have suffered a subacute anterior MI. STAT echo was ordered and EDP advised to order an arterial duplex with recommendation to consult vascular surgery. IV heparin ordered and the patient was transferred to [facility] for further evaluation by vascular surgery, as it was felt that he had a right femoral embolism with concern for threatened limb. Dr Imogene Burn subsequently performed a right femoral embolectomy and right calf compartment fasciotomies x 4 . At 4:16 a.m. On 02/22/16, the patient complained of loss of sensation to RLE, and the patient reports he was in severe pain. He was given pain medication which did not alleviate his symptoms. He was re-evaluated by Dr. Imogene Burn at 7:33 a.m. Who removed the ACE wrap from his RLE which relieved the pain, and the patient was felt to have excessive bleeding in his calf related to full anti-coagulation. He was taken back to surgery for evacuation of hematoma and VAC dressings. Cardiologist continues to follow for management of his subacute MI. Patient underwent closure of lateral fasciotomy. Continues to have wound vac on medial aspect. Plastic surgery consulted.   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  -Wound VAC in place, medial aspect.  -Case taken over by Dr. Arbie Cookey after patient requested a second opinion.  -Lipids well controlled, cholesterol 116, LDL 60.  -s/p closure of lateral fasciotomy site and placement of VAC on medial fasciotomy site -Plastic surgery consulted by vascular for possible STSG to medial fasciotomy site  Symptomatic anemia/ Postoperative anemia/ Anemia secondary to blood loss -Overnight, hemglobin dropped to 4.7 -Patient became hypotensive, lethargic -Currently on heparin -Has received a total of 5u PRBCs -Hemoglobin currently 9 -Continue to monitor CBC  ?Post-transfusion fever -Continue tylenol PRN -Continue to monitor closely  Syncope and orthostasis -Likely secondary to blood loss -Continue to monitor closely  Anterior subendocardial MI (HCC)/Abnormal EKG/Elevated troponin -On therapeutic dose heparin.  -Cardiology consulted and appreciated -Continue Inderal -ACE-I to be started when BP able to tolerate -Will eventually need cardiac cath  Newly diagnosed diabetes mellitus/Hyperglycemia -Hemoglobin A1c 9.2.  -Continue lantus, ISS and CBG monitoring.  -Diabetes coordinator consulted  Hyponatremia -Likely from hyperglycemia. Correct blood glucose and monitor. -Continue to monitor BMP  DVT Prophylaxis  heparin  Code Status: Full  Family Communication: Son at bedside  Disposition Plan: Admitted. Pending Plastic surg consult, further recommendations from vascular surgery  Consultants Cardiology  Vascular Surgery Plastic surgery  Procedures  Right femoral embolectomy Right calf four compartment fasciotomies  Evacuation of hematoma from bilateral right calf fasciotomies Placement of negative pressure dressings x2 Closure of lateral fasciotomy site  and placement of VAC on medial fasciotomy site  Antibiotics   Anti-infectives    Start     Dose/Rate Route Frequency Ordered Stop   02/26/16 0930  [MAR Hold]  cefUROXime (ZINACEF) 1.5 g in dextrose 5 % 50 mL IVPB     (MAR Hold since 02/26/16 0928)   1.5 g 100 mL/hr over 30 Minutes Intravenous To [facility] 02/25/16 1055 02/26/16 1120   02/22/16 0800  cefUROXime (ZINACEF) 1.5 g in dextrose 5 % 50 mL IVPB     1.5 g 100 mL/hr over 30 Minutes Intravenous Every 12 hours 02/21/16 2115 02/22/16 2140   02/22/16 0600  cefUROXime (ZINACEF) injection 1.5 g     1.5 g Intramuscular On call to O.R. 02/21/16 1523 02/21/16 1713      Subjective:   Khole Mitter seen and examined today.  Continues to have leg pain. Denies chest pain, shortness of breath, abdominal pain, abdominal pain, dizziness, headache.   Objective:   Filed Vitals:   02/27/16 0400 02/27/16 0500 02/27/16 0600 02/27/16 0828  BP: 99/81   123/86  Pulse: 72 73 79 74  Temp:    98 F (36.7 C)  TempSrc:    Oral  Resp: 14 12 13 14   Height:      Weight:      SpO2: 98% 96% 98% 98%    Intake/Output Summary (Last 24 hours) at 02/27/16 1113 Last data filed at 02/27/16 0900  Gross per 24 hour  Intake 2157.82 ml  Output   1700 ml  Net 457.82 ml   Filed Weights   02/25/16 0500 02/26/16 0500 02/27/16 0325  Weight: 91.1 kg (200 lb 13.4 oz) 103.2 kg (227 lb 8.2 oz) 104.6 kg (230 lb 9.6 oz)    Exam  General: Well developed, well nourished, no distress  HEENT: NCAT, mucous membranes moist.   Cardiovascular: S1 S2 auscultated, no murmurs, RRR  Respiratory: Clear to auscultation bilaterally   Abdomen: Soft, nontender, nondistended, + bowel sounds  Extremities: warm dry without cyanosis clubbing or edema. Wound vac RLE medial aspect, lateral aspect- staples noted  Neuro: AAOx3, nonfocal  Psych: Appropriate mood and affect, pleasant   Data Reviewed: I have personally reviewed following labs and imaging  studies  CBC:  Recent Labs Lab 02/21/16 0827  02/24/16 0320 02/25/16 0348 02/26/16 0005 02/26/16 0103 02/26/16 0812 02/26/16 1325 02/27/16 0330  WBC 10.1  < > 9.3 9.6  --  12.5* 12.9*  --  16.5*  NEUTROABS 7.9*  --   --   --   --   --  8.5*  --   --   HGB 15.5  < > 8.8* 7.8* 4.7* 4.7* 6.3*  6.6* 10.3* 9.0*  HCT 42.8  < > 26.4* 22.7* 14.2* 14.0* 18.7*  19.6* 30.5* 25.9*  MCV 82.6  < > 84.3 85.3  --  87.0 88.2  --  82.7  PLT 250  < > 198 265  --  304 223  --  198  < > = values in this interval not displayed. Basic Metabolic Panel:  Recent Labs Lab 02/22/16 0400 02/23/16 0339 02/25/16 0348 02/26/16 0812 02/27/16 0330  NA 130* 132* 133* 132* 128*  K 4.4 3.5 4.2 4.2 4.1  CL 101 102 102 104 100*  CO2 23 24 26 22 23   GLUCOSE 303* 275* 234* 204* 212*  BUN 12 10 5* 12 15  CREATININE 1.01 0.97 0.88 0.89 1.01  CALCIUM 7.9* 7.5* 7.6* 7.0* 7.0*   GFR: Estimated  Creatinine Clearance: 95.8 mL/min (by C-G formula based on Cr of 1.01). Liver Function Tests:  Recent Labs Lab 02/23/16 0339  AST 15  ALT 17  ALKPHOS 35*  BILITOT 0.5  PROT 4.6*  ALBUMIN 2.1*   No results for input(s): LIPASE, AMYLASE in the last 168 hours. No results for input(s): AMMONIA in the last 168 hours. Coagulation Profile:  Recent Labs Lab 02/21/16 1342  INR 1.11   Cardiac Enzymes:  Recent Labs Lab 02/21/16 0827 02/21/16 2026 02/22/16 0400  TROPONINI 0.84* 0.94* 0.85*   BNP (last 3 results) No results for input(s): PROBNP in the last 8760 hours. HbA1C: No results for input(s): HGBA1C in the last 72 hours. CBG:  Recent Labs Lab 02/26/16 1001 02/26/16 1309 02/26/16 1401 02/26/16 1657 02/26/16 2111  GLUCAP 191* 189* 192* 227* 286*   Lipid Profile: No results for input(s): CHOL, HDL, LDLCALC, TRIG, CHOLHDL, LDLDIRECT in the last 72 hours. Thyroid Function Tests: No results for input(s): TSH, T4TOTAL, FREET4, T3FREE, THYROIDAB in the last 72 hours. Anemia Panel: No results  for input(s): VITAMINB12, FOLATE, FERRITIN, TIBC, IRON, RETICCTPCT in the last 72 hours. Urine analysis:    Component Value Date/Time   COLORURINE YELLOW 02/26/2016 0229   APPEARANCEUR CLEAR 02/26/2016 0229   LABSPEC 1.014 02/26/2016 0229   PHURINE 6.0 02/26/2016 0229   GLUCOSEU 500* 02/26/2016 0229   HGBUR NEGATIVE 02/26/2016 0229   BILIRUBINUR NEGATIVE 02/26/2016 0229   KETONESUR NEGATIVE 02/26/2016 0229   PROTEINUR NEGATIVE 02/26/2016 0229   NITRITE NEGATIVE 02/26/2016 0229   LEUKOCYTESUR NEGATIVE 02/26/2016 0229   Sepsis Labs: (procalcitonin:4,lacticidven:4)  ) Recent Results (from the past 240 hour(s))  MRSA PCR Screening     Status: None   Collection Time: 02/21/16 10:17 PM  Result Value Ref Range Status   MRSA by PCR NEGATIVE NEGATIVE Final    Comment:        The GeneXpert MRSA Assay (FDA approved for NASAL specimens only), is one component of a comprehensive MRSA colonization surveillance program. It is not intended to diagnose MRSA infection nor to guide or monitor treatment for MRSA infections.       Radiology Studies: Dg Chest Port 1 View  02/26/2016  CLINICAL DATA:  Fever, onset tonight. EXAM: PORTABLE CHEST 1 VIEW COMPARISON:  None. FINDINGS: A single AP portable view of the chest demonstrates no focal airspace consolidation or alveolar edema. The lungs are grossly clear. There is no large effusion or pneumothorax. Cardiac and mediastinal contours appear unremarkable. IMPRESSION: No acute findings Electronically Signed   By: Ellery Plunk M.D.   On: 02/26/2016 02:15     Scheduled Meds: . sodium chloride   Intravenous Once  . aspirin  325 mg Oral Daily  . atorvastatin  80 mg Oral q1800  . docusate sodium  100 mg Oral Daily  . insulin aspart  0-15 Units Subcutaneous TID WC  . insulin aspart  0-5 Units Subcutaneous QHS  . insulin aspart  6 Units Subcutaneous TID WC  . insulin glargine  20 Units Subcutaneous QHS  . pantoprazole  40 mg Oral  Daily  . propranolol  10 mg Oral TID   Continuous Infusions: . sodium chloride 75 mL/hr at 02/26/16 0230  . heparin 1,100 Units/hr (02/26/16 2230)  . lactated ringers 10 mL/hr at 02/26/16 0955     LOS: 6 days   Time Spent in minutes   30 minutes  Samani Deal D.O. on 02/27/2016 at 11:13 AM  Between 7am to 7pm - Pager -  [phone]  After 7pm go to [email or web address] - password [email or web address]  And look for the night coverage person covering for me after hours  [facility] Office  [phone]

## 2016-02-27 NOTE — Consult Note (Signed)
Reason for Consult: fasciotomy wound, possible skin graft Referring Physician: Althea Charon MD Location: [facility]: inpatient Date: 7.1.17  Lot Medford is an 55 y.o. male.  HPI: Patient admitted 6.25.17 at [facility] presenting with right leg pain and paresthesias. Work up revealed sub acute anteroapical MI with large clot burden ventricle and embolus to right femoral artery. Underwent emergent embolectomy and fasciotomies RLE. Started on heparin anticoagulation and course has been complicated by hematoma calf requiring operative evacuation on POD#1. Also at this time the incisions were extended between skip fasciotomies and connected.  Has had drop in hemoglobin and required 2 units PRBC yesterday. Returned to OR yesterday for reexamination, no significant drainage noted. Able to primarily close lateral fasciotomy, VAC reapplied to medial. Plastic Surgery consulted for treatment medial fasciotomy wound.  Per chart, suspect MI occurred 2 weeks ago and Cardiology notes will require further cardiac work up once wound healed. EF 30-35% on admission ECHO. Patient reports he was diagnosed with DM a year ago but states this was controlled with his diet.   PTA, patient independent living with children. 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   Past Medical History  Diagnosis Date  . Diabetes ([place])   . NSTEMI (non-ST elevated myocardial infarction) ([place])   . LV (left ventricular) mural thrombus [facility])     Past Surgical History  Procedure Laterality Date  . Embolectomy Right 02/21/2016    Procedure: EMBOLECTOMY RIGHT COMMON FEMORAL ARTERY; FOUR COMPARTMENT FASCIOTOMY;  Surgeon: Conrad [place], MD;  Location: [place];  Service: Vascular;  Laterality: Right;  . Hematoma evacuation Right 02/22/2016    Procedure: EVACUATION HEMATOMA With Placement of Negative Pressure Dressing;  Surgeon: Conrad , MD;  Location: [place];  Service: Vascular;  Laterality: Right;  .  Application of wound vac Right 02/22/2016    Procedure: APPLICATION OF Negative Pressure Dressing Right Lower Leg;  Surgeon: Conrad , MD;  Location: [place];  Service: Vascular;  Laterality: Right;    History reviewed. No pertinent family history.  Social History:  reports that he has never smoked. He does not have any smokeless tobacco history on file. He reports that he does not drink alcohol or use illicit drugs.  Allergies: No Known Allergies  Medications: I have reviewed the patient's current medications.  Results for orders placed or performed during the hospital encounter of 02/21/16 (from the past 48 hour(s))  Glucose, capillary     Status: Abnormal   Collection Time: 02/25/16  1:04 PM  Result Value Ref Range   Glucose-Capillary 209 (H) 65 - 99 mg/dL  Glucose, capillary     Status: Abnormal   Collection Time: 02/25/16  5:37 PM  Result Value Ref Range   Glucose-Capillary 217 (H) 65 - 99 mg/dL   Comment 1 Capillary Specimen   Glucose, capillary     Status: Abnormal   Collection Time: 02/25/16  9:15 PM  Result Value Ref Range   Glucose-Capillary 238 (H) 65 - 99 mg/dL   Comment 1 Capillary Specimen   Prepare RBC     Status: None   Collection Time: 02/25/16  9:53 PM  Result Value Ref Range   Order Confirmation ORDER PROCESSED BY BLOOD BANK   Glucose, capillary     Status: Abnormal   Collection Time: 02/25/16 11:41 PM  Result Value Ref Range   Glucose-Capillary 265 (H) 65 - 99 mg/dL   Comment 1 Capillary Specimen   Prepare RBC  Status: None   Collection Time: 02/26/16 12:04 AM  Result Value Ref Range   Order Confirmation ORDER PROCESSED BY BLOOD BANK   Hemoglobin and hematocrit, blood     Status: Abnormal   Collection Time: 02/26/16 12:05 AM  Result Value Ref Range   Hemoglobin 4.7 (LL) 13.0 - 17.0 g/dL    Comment: REPEATED TO VERIFY SPECIMEN CHECKED FOR CLOTS CRITICAL RESULT CALLED TO, READ BACK BY AND VERIFIED WITH: SHANNON STOWE RN ON 6.30.2017 AT 0057 BY  SHORT TIFFANY    HCT 14.2 (L) 39.0 - 52.0 %  CBC     Status: Abnormal   Collection Time: 02/26/16  1:03 AM  Result Value Ref Range   WBC 12.5 (H) 4.0 - 10.5 K/uL   RBC 1.61 (L) 4.22 - 5.81 MIL/uL   Hemoglobin 4.7 (LL) 13.0 - 17.0 g/dL    Comment: REPEATED TO VERIFY SPECIMEN CHECKED FOR CLOTS CRITICAL VALUE NOTED.  VALUE IS CONSISTENT WITH PREVIOUSLY REPORTED AND CALLED VALUE.    HCT 14.0 (L) 39.0 - 52.0 %   MCV 87.0 78.0 - 100.0 fL   MCH 29.2 26.0 - 34.0 pg   MCHC 33.6 30.0 - 36.0 g/dL   RDW 12.5 11.5 - 15.5 %   Platelets 304 150 - 400 K/uL  Prepare RBC     Status: None   Collection Time: 02/26/16  1:22 AM  Result Value Ref Range   Order Confirmation ORDER PROCESSED BY BLOOD BANK   Urinalysis, Routine w reflex microscopic (not at [facility])     Status: Abnormal   Collection Time: 02/26/16  2:29 AM  Result Value Ref Range   Color, Urine YELLOW YELLOW   APPearance CLEAR CLEAR   Specific Gravity, Urine 1.014 1.005 - 1.030   pH 6.0 5.0 - 8.0   Glucose, UA 500 (A) NEGATIVE mg/dL   Hgb urine dipstick NEGATIVE NEGATIVE   Bilirubin Urine NEGATIVE NEGATIVE   Ketones, ur NEGATIVE NEGATIVE mg/dL   Protein, ur NEGATIVE NEGATIVE mg/dL   Nitrite NEGATIVE NEGATIVE   Leukocytes, UA NEGATIVE NEGATIVE    Comment: MICROSCOPIC NOT DONE ON URINES WITH NEGATIVE PROTEIN, BLOOD, LEUKOCYTES, NITRITE, OR GLUCOSE <1000 mg/dL.  Glucose, capillary     Status: Abnormal   Collection Time: 02/26/16  7:45 AM  Result Value Ref Range   Glucose-Capillary 216 (H) 65 - 99 mg/dL  Heparin level (unfractionated)     Status: None   Collection Time: 02/26/16  8:12 AM  Result Value Ref Range   Heparin Unfractionated 0.69 0.30 - 0.70 IU/mL    Comment:        IF HEPARIN RESULTS ARE BELOW EXPECTED VALUES, AND PATIENT DOSAGE HAS BEEN CONFIRMED, SUGGEST FOLLOW UP TESTING OF ANTITHROMBIN III LEVELS.   Basic metabolic panel     Status: Abnormal   Collection Time: 02/26/16  8:12 AM  Result Value Ref Range   Sodium  132 (L) 135 - 145 mmol/L   Potassium 4.2 3.5 - 5.1 mmol/L   Chloride 104 101 - 111 mmol/L   CO2 22 22 - 32 mmol/L   Glucose, Bld 204 (H) 65 - 99 mg/dL   BUN 12 6 - 20 mg/dL   Creatinine, Ser 0.89 0.61 - 1.24 mg/dL   Calcium 7.0 (L) 8.9 - 10.3 mg/dL   GFR calc non Af Amer >60 >60 mL/min   GFR calc Af Amer >60 >60 mL/min    Comment: (NOTE) The eGFR has been calculated using the CKD EPI equation. This calculation has not been  validated in all clinical situations. eGFR's persistently <60 mL/min signify possible Chronic Kidney Disease.    Anion gap 6 5 - 15  Hemoglobin and hematocrit, blood     Status: Abnormal   Collection Time: 02/26/16  8:12 AM  Result Value Ref Range   Hemoglobin 6.6 (LL) 13.0 - 17.0 g/dL    Comment: DELTA CHECK NOTED REPEATED TO VERIFY POST TRANSFUSION SPECIMEN    HCT 19.6 (L) 39.0 - 52.0 %  CBC with Differential/Platelet     Status: Abnormal   Collection Time: 02/26/16  8:12 AM  Result Value Ref Range   WBC 12.9 (H) 4.0 - 10.5 K/uL    Comment: WHITE COUNT CONFIRMED ON SMEAR   RBC 2.12 (L) 4.22 - 5.81 MIL/uL   Hemoglobin 6.3 (LL) 13.0 - 17.0 g/dL    Comment: CRITICAL VALUE NOTED.  VALUE IS CONSISTENT WITH PREVIOUSLY REPORTED AND CALLED VALUE. REPEATED TO VERIFY    HCT 18.7 (L) 39.0 - 52.0 %   MCV 88.2 78.0 - 100.0 fL   MCH 29.7 26.0 - 34.0 pg   MCHC 33.7 30.0 - 36.0 g/dL   RDW 13.4 11.5 - 15.5 %   Platelets 223 150 - 400 K/uL    Comment: PLATELET COUNT CONFIRMED BY SMEAR   Neutrophils Relative % 66 %   Lymphocytes Relative 27 %   Monocytes Relative 7 %   Eosinophils Relative 0 %   Basophils Relative 0 %   Neutro Abs 8.5 (H) 1.7 - 7.7 K/uL   Lymphs Abs 3.5 0.7 - 4.0 K/uL   Monocytes Absolute 0.9 0.1 - 1.0 K/uL   Eosinophils Absolute 0.0 0.0 - 0.7 K/uL   Basophils Absolute 0.0 0.0 - 0.1 K/uL   RBC Morphology POLYCHROMASIA PRESENT    WBC Morphology MILD LEFT SHIFT (1-5% METAS, OCC MYELO, OCC BANDS)   Lactic acid, plasma     Status: None    Collection Time: 02/26/16  8:12 AM  Result Value Ref Range   Lactic Acid, Venous 1.6 0.5 - 1.9 mmol/L  Glucose, capillary     Status: Abnormal   Collection Time: 02/26/16 10:01 AM  Result Value Ref Range   Glucose-Capillary 191 (H) 65 - 99 mg/dL  Prepare RBC     Status: None   Collection Time: 02/26/16 10:20 AM  Result Value Ref Range   Order Confirmation ORDER PROCESSED BY BLOOD BANK   Glucose, capillary     Status: Abnormal   Collection Time: 02/26/16  1:09 PM  Result Value Ref Range   Glucose-Capillary 189 (H) 65 - 99 mg/dL  Hemoglobin and hematocrit, blood     Status: Abnormal   Collection Time: 02/26/16  1:25 PM  Result Value Ref Range   Hemoglobin 10.3 (L) 13.0 - 17.0 g/dL    Comment: POST TRANSFUSION SPECIMEN   HCT 30.5 (L) 39.0 - 52.0 %  Glucose, capillary     Status: Abnormal   Collection Time: 02/26/16  2:01 PM  Result Value Ref Range   Glucose-Capillary 192 (H) 65 - 99 mg/dL   Comment 1 Notify RN   Glucose, capillary     Status: Abnormal   Collection Time: 02/26/16  4:57 PM  Result Value Ref Range   Glucose-Capillary 227 (H) 65 - 99 mg/dL  Heparin level (unfractionated)     Status: Abnormal   Collection Time: 02/26/16  8:35 PM  Result Value Ref Range   Heparin Unfractionated 0.71 (H) 0.30 - 0.70 IU/mL    Comment:  IF HEPARIN RESULTS ARE BELOW EXPECTED VALUES, AND PATIENT DOSAGE HAS BEEN CONFIRMED, SUGGEST FOLLOW UP TESTING OF ANTITHROMBIN III LEVELS.   Glucose, capillary     Status: Abnormal   Collection Time: 02/26/16  9:11 PM  Result Value Ref Range   Glucose-Capillary 286 (H) 65 - 99 mg/dL   Comment 1 Capillary Specimen   Heparin level (unfractionated)     Status: None   Collection Time: 02/27/16  3:30 AM  Result Value Ref Range   Heparin Unfractionated 0.49 0.30 - 0.70 IU/mL    Comment:        IF HEPARIN RESULTS ARE BELOW EXPECTED VALUES, AND PATIENT DOSAGE HAS BEEN CONFIRMED, SUGGEST FOLLOW UP TESTING OF ANTITHROMBIN III LEVELS.   CBC      Status: Abnormal   Collection Time: 02/27/16  3:30 AM  Result Value Ref Range   WBC 16.5 (H) 4.0 - 10.5 K/uL   RBC 3.13 (L) 4.22 - 5.81 MIL/uL   Hemoglobin 9.0 (L) 13.0 - 17.0 g/dL   HCT 25.9 (L) 39.0 - 52.0 %   MCV 82.7 78.0 - 100.0 fL   MCH 28.8 26.0 - 34.0 pg   MCHC 34.7 30.0 - 36.0 g/dL   RDW 14.7 11.5 - 15.5 %   Platelets 198 150 - 400 K/uL  Basic metabolic panel     Status: Abnormal   Collection Time: 02/27/16  3:30 AM  Result Value Ref Range   Sodium 128 (L) 135 - 145 mmol/L   Potassium 4.1 3.5 - 5.1 mmol/L   Chloride 100 (L) 101 - 111 mmol/L   CO2 23 22 - 32 mmol/L   Glucose, Bld 212 (H) 65 - 99 mg/dL   BUN 15 6 - 20 mg/dL   Creatinine, Ser 1.01 0.61 - 1.24 mg/dL   Calcium 7.0 (L) 8.9 - 10.3 mg/dL   GFR calc non Af Amer >60 >60 mL/min   GFR calc Af Amer >60 >60 mL/min    Comment: (NOTE) The eGFR has been calculated using the CKD EPI equation. This calculation has not been validated in all clinical situations. eGFR's persistently <60 mL/min signify possible Chronic Kidney Disease.    Anion gap 5 5 - 15    Dg Chest Port 1 View  02/26/2016  CLINICAL DATA:  Fever, onset tonight. EXAM: PORTABLE CHEST 1 VIEW COMPARISON:  None. FINDINGS: A single AP portable view of the chest demonstrates no focal airspace consolidation or alveolar edema. The lungs are grossly clear. There is no large effusion or pneumothorax. Cardiac and mediastinal contours appear unremarkable. IMPRESSION: No acute findings Electronically Signed   By: Andreas Newport M.D.   On: 02/26/2016 02:15    ROS Blood pressure 123/86, pulse 74, temperature 98 F (36.7 C), temperature source Oral, resp. rate 14, height '5\' 8"'  (1.727 m), weight 104.6 kg (230 lb 9.6 oz), SpO2 98 %. Physical Exam Alert NAD VAC in place over medial lower leg with cannister nearly full watery serosanguinous fluid. Ioban in place and not able to examine incisions. + gross sensation and movement of foot  Assessment/Plan: Options have  been reviewed by Vascular Surgery. These were again discussed today. Given significant edema, do not anticipate will be able to close primarily. Options include continued VAC and transition to local wound care vs skin graft. Given anticoagulation, significant risk of bleeding complications that could cause failure of skin graft and donor site bleeding. Reviewed both donor site and grafted area will have patch like appearance, risk hyper or hypopigmentation in  future. Patient states appearance of area is important to him and desires reconstruction with same tissue of leg-again discussed that Vascular Surgery felt leg to edematous to close and that this is common with the severity of his pathology. In answer to his son's question, counseled if skin graft has full or even partial take, this would indeed get him to wound healing endpoint faster than local wound care. He also had question regarding infection risk, counseled both share this, do not feel one option has greater risk.  I mistakenly stated to patient that he had two fasciotomy incisions following his first surgery. The patient became quite agitated by this statement and reports I was using or receiving manipulated information. He stated he had 4 incisions at that time and then made this two. I reported I would read records again and apologized if I did not appreciate this prior to our discussion. Over half of discussion time was spent with patient relating the events leading to second surgery. These are documented in chart. He states [facility] should make a report of this. I attempted to redirect the patient stating I was not present for that time, and that I would like to focus on what I can help with current situation.  We discussed his HbA1c on admission was 9.2, that uncontrolled DM will impair his ability to heal wound.   Patient states he is not sure he had a heart attack, states that the episode a few weeks ago that he reported emesis he relates to  heat. Counseled the heat, activity may have contributed but laboratory and imaging obtained to date indicate prior MI.  Patient requests to discuss with Dr. Donnetta Hutching prior to making decision, requests that all his physicians discuss together what will be best plan. I have sent message to Dr. Donnetta Hutching the patient's request.   Irene Limbo, MD [facility] [phone], pin [phone]

## 2016-02-27 NOTE — Progress Notes (Signed)
   VASCULAR SURGERY ASSESSMENT & PLAN:   1 Day Post-Op s/p: closure of lateral fasciotomy site and placement of VAC on medial fasciotomy site.    Dr. Arbie Cookey has consulted Plastic Surgery to evaluate for possible STSG to medial fasciotomy site.    Elevate leg  SUBJECTIVE: Pain adequately controlled.  PHYSICAL EXAM: Filed Vitals:   02/27/16 0325 02/27/16 0400 02/27/16 0500 02/27/16 0600  BP: 103/75 99/81    Pulse: 74 72 73 79  Temp: 98.5 F (36.9 C)     TempSrc: Oral     Resp: 16 14 12 13   Height:      Weight: 230 lb 9.6 oz (104.6 kg)     SpO2: 96% 98% 96% 98%   VAC on medial leg with good seal. Lateral wound closed.  Right foot warm and well-perfused.  LABS: Lab Results  Component Value Date   WBC 16.5* 02/27/2016   HGB 9.0* 02/27/2016   HCT 25.9* 02/27/2016   MCV 82.7 02/27/2016   PLT 198 02/27/2016   Lab Results  Component Value Date   CREATININE 1.01 02/27/2016   Lab Results  Component Value Date   INR 1.11 02/21/2016   CBG (last 3)   Recent Labs  02/26/16 1401 02/26/16 1657 02/26/16 2111  GLUCAP 192* 227* 286*    Principal Problem:   LV (left ventricular) mural thrombus (HCC) Active Problems:   Anterior subendocardial MI (HCC)   Right leg pain   Non-STEMI (non-ST elevated myocardial infarction) (HCC)   Elevated troponin   Abnormal EKG   Diabetes (HCC)   Acute MI (HCC)   History of embolectomy   Pain   Thromboembolism [facility])   Vascular occlusion    Cari Caraway Beeper: [phone] 02/27/2016

## 2016-02-28 LAB — BASIC METABOLIC PANEL
Anion gap: 6 (ref 5–15)
BUN: 10 mg/dL (ref 6–20)
CO2: 24 mmol/L (ref 22–32)
Calcium: 7.3 mg/dL — ABNORMAL LOW (ref 8.9–10.3)
Chloride: 98 mmol/L — ABNORMAL LOW (ref 101–111)
Creatinine, Ser: 0.85 mg/dL (ref 0.61–1.24)
GFR calc Af Amer: >60 mL/min (ref >60)
GLUCOSE: 162 mg/dL — AB (ref 65–99)
POTASSIUM: 3.9 mmol/L (ref 3.5–5.1)
Sodium: 128 mmol/L — ABNORMAL LOW (ref 135–145)

## 2016-02-28 LAB — GLUCOSE, CAPILLARY
GLUCOSE-CAPILLARY: 242 mg/dL — AB (ref 65–99)
GLUCOSE-CAPILLARY: 144 mg/dL — AB (ref 65–99)
Glucose-Capillary: 178 mg/dL — ABNORMAL HIGH (ref 65–99)
Glucose-Capillary: 145 mg/dL — ABNORMAL HIGH (ref 65–99)

## 2016-02-28 LAB — CBC
HEMATOCRIT: 23.3 % — AB (ref 39.0–52.0)
Hemoglobin: 7.9 g/dL — ABNORMAL LOW (ref 13.0–17.0)
MCH: 29.4 pg (ref 26.0–34.0)
MCHC: 33.9 g/dL (ref 30.0–36.0)
MCV: 86.6 fL (ref 78.0–100.0)
PLATELETS: 209 10*3/uL (ref 150–400)
RBC: 2.69 MIL/uL — AB (ref 4.22–5.81)
RDW: 15.6 % — ABNORMAL HIGH (ref 11.5–15.5)
WBC: 11.8 10*3/uL — ABNORMAL HIGH (ref 4.0–10.5)

## 2016-02-28 LAB — HEMOGLOBIN AND HEMATOCRIT, BLOOD
HCT: 23 % — ABNORMAL LOW (ref 39.0–52.0)
Hemoglobin: 7.8 g/dL — ABNORMAL LOW (ref 13.0–17.0)

## 2016-02-28 LAB — HEPARIN LEVEL (UNFRACTIONATED): Heparin Unfractionated: 0.58 IU/mL (ref 0.30–0.70)

## 2016-02-28 NOTE — Progress Notes (Signed)
ANTICOAGULATION CONSULT NOTE - Follow Up Consult  Pharmacy Consult for Heparin  Indication: Subacute MI/vascular occlusion  No Known Allergies  Patient Measurements: Height: 5\' 8"  (172.7 cm) Weight: 231 lb 4.2 oz (104.9 kg) IBW/kg (Calculated) : 68.4  Heparin dosing wt: 87 kg  Vital Signs: Temp: 97.9 F (36.6 C) (07/02 0749) Temp Source: Oral (07/02 0749) BP: 102/64 mmHg (07/02 1000) Pulse Rate: 90 (07/02 1000)  Labs:  Recent Labs  02/26/16 0812 02/26/16 1325  02/27/16 0330 02/27/16 1158 02/28/16 0259  HGB 6.3*  6.6* 10.3*  --  9.0*  --  7.9*  HCT 18.7*  19.6* 30.5*  --  25.9*  --  23.3*  PLT 223  --   --  198  --  209  HEPARINUNFRC 0.69  --   < > 0.49 0.60 0.58  CREATININE 0.89  --   --  1.01  --  0.85  < > = values in this interval not displayed.  Estimated Creatinine Clearance: 113.9 mL/min (by C-G formula based on Cr of 0.85).  Medication Infusion: . heparin 1,100 Units/hr (02/27/16 2258)   Assessment: 55 yo male with LV thrombus and R femoral embolism s/p embolectomy on 6/25 and now s/p hematoma evacuation. Pt s/p closure of lateral fasciotomy and VAC placement to medial fasciotomy.  HL is at goal at 0.58. Hb 10.3 >> 4.7 s/p 5 units PRBC > 9 >7.9, platelets are stable. Spoke with RN and wound VAC does have some drainage but not overtly bleeding.  Goal of Therapy:  Heparin level 0.3-0.7 units/ml, attempt middle range with bleeding risk Monitor platelets by anticoagulation protocol: Yes   Plan:  -Continue IV heparin at current rate. -Check HL and CBC with am labs -F/u plans for oral anticoagulation eventually -Daily HL and CBC -Monitor for s/sx of bleeding  Tad Moore, BCPS  Clinical Pharmacist Pager [phone]  02/28/2016 10:14 AM

## 2016-02-28 NOTE — Progress Notes (Signed)
PROGRESS NOTE    Troy Yu  ZOX:[id number] DOB: 15-Jul-1961 DOA: 02/21/2016 PCP: No primary care provider on file.   Brief Narrative:  Troy Yu is an 55 y.o. male who was in his usual state of health until about 2 weeks ago when he had substernal chest pain that lasted about 48 hours.The pain went away after he vomited, and he attributed his symptoms to a prolonged religious fast. He then come to the [facility] ED on 02/21/16 with severe RLE pain associated with paresthesias. Upon presentation, he was found to have an abnormal EKG and elevated troponin. Cardiology was subsequently called to evaluate the patient, and he was felt to have suffered a subacute anterior MI. STAT echo was ordered and EDP advised to order an arterial duplex with recommendation to consult vascular surgery. IV heparin ordered and the patient was transferred to [facility] for further evaluation by vascular surgery, as it was felt that he had a right femoral embolism with concern for threatened limb. Dr Imogene Burn subsequently performed a right femoral embolectomy and right calf compartment fasciotomies x 4 . At 4:16 a.m. On 02/22/16, the patient complained of loss of sensation to RLE, and the patient reports he was in severe pain. He was given pain medication which did not alleviate his symptoms. He was re-evaluated by Dr. Imogene Burn at 7:33 a.m. Who removed the ACE wrap from his RLE which relieved the pain, and the patient was felt to have excessive bleeding in his calf related to full anti-coagulation. He was taken back to surgery for evacuation of hematoma and VAC dressings. Cardiologist continues to follow for management of his subacute MI. Patient underwent closure of lateral fasciotomy. Continues to have wound vac on medial aspect. Plastic surgery consulted.   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  -Case taken over by Dr. Arbie Cookey after patient requested a second opinion.  -Wound VAC in place, medial aspect. To be changed tomorrow, 02/29/2016 -Lipids well controlled, cholesterol 116, LDL 60.  -s/p closure of lateral fasciotomy site and placement of VAC on medial fasciotomy site -Plastic surgery consulted by vascular for possible STSG to medial fasciotomy site  Symptomatic anemia/ Postoperative anemia/ Anemia secondary to blood loss -During hospitalization, Hemglobin dropped to 4.7.  Patient became hypotensive, lethargic -Currently on heparin -Has received a total of 5u PRBCs -Hemoglobin currently 7.9 (was up to 10.3) -Continue to monitor CBC  ?Post-transfusion fever -Continue tylenol PRN -Continue to monitor closely  Syncope and orthostasis -Likely secondary to blood loss -Continue to monitor closely  Anterior subendocardial MI (HCC)/Abnormal EKG/Elevated troponin -On therapeutic dose heparin.  -Cardiology consulted and appreciated -Continue Inderal -ACE-I to be started when BP able to tolerate -Will eventually need cardiac cath  Newly diagnosed diabetes mellitus/Hyperglycemia -Hemoglobin A1c 9.2.  -Continue lantus, ISS and CBG monitoring.  -Diabetes coordinator consulted  Hyponatremia -Possibly from hyperglycemia. Correct blood glucose and monitor. -Continue to monitor BMP  DVT Prophylaxis  heparin  Code Status: Full  Family Communication: None at bedside  Disposition Plan: Admitted. Pending Plastic surg consult, further recommendations from vascular surgery. Continue to monitor hemoglobin.  Consultants Cardiology  Vascular Surgery Plastic surgery  Procedures  Right femoral embolectomy Right calf four compartment fasciotomies  Evacuation of hematoma from bilateral  right calf fasciotomies Placement of negative pressure dressings x2 Closure of lateral fasciotomy site and placement of VAC on medial fasciotomy site  Antibiotics   Anti-infectives    Start     Dose/Rate Route Frequency Ordered Stop   02/26/16 0930  [MAR Hold]  cefUROXime (ZINACEF) 1.5 g in dextrose 5 % 50 mL IVPB     (MAR Hold since 02/26/16 0928)   1.5 g 100 mL/hr over 30 Minutes Intravenous To ShortStay Surgical 02/25/16 1055 02/26/16 1120   02/22/16 0800  cefUROXime (ZINACEF) 1.5 g in dextrose 5 % 50 mL IVPB     1.5 g 100 mL/hr over 30 Minutes Intravenous Every 12 hours 02/21/16 2115 02/22/16 2140   02/22/16 0600  cefUROXime (ZINACEF) injection 1.5 g     1.5 g Intramuscular On call to O.R. 02/21/16 1523 02/21/16 1713      Subjective:   Troy Yu seen and examined today. Denies chest pain, shortness of breath, abdominal pain, abdominal pain, dizziness, headache.  Does not understand why his hemoglobin continues to drop. Very apprehensive and nervous about wound vac change tomorrow due to pain.  Objective:   Filed Vitals:   02/28/16 0700 02/28/16 0749 02/28/16 0800 02/28/16 1000  BP:  95/68 101/70 102/64  Pulse: 81 79 84 90  Temp:  97.9 F (36.6 C)    TempSrc:  Oral    Resp: 11  14 19   Height:      Weight:      SpO2: 98% 98% 98% 97%    Intake/Output Summary (Last 24 hours) at 02/28/16 1038 Last data filed at 02/28/16 0900  Gross per 24 hour  Intake   1832 ml  Output   6275 ml  Net  -4443 ml   Filed Weights   02/26/16 0500 02/27/16 0325 02/28/16 0255  Weight: 103.2 kg (227 lb 8.2 oz) 104.6 kg (230 lb 9.6 oz) 104.9 kg (231 lb 4.2 oz)    Exam  General: Well developed, well nourished, no apparent distress  HEENT: NCAT, mucous membranes moist.   Cardiovascular: S1 S2 auscultated, no murmurs, RRR  Respiratory: Clear to auscultation bilaterally with equal chest rise. No wheezing.   Abdomen: Soft, nontender, nondistended, + bowel sounds  Extremities:  warm dry without cyanosis clubbing or edema. Wound vac RLE medial aspect, lateral aspect- staples noted  Neuro: AAOx3, nonfocal  Psych: Appropriate mood and affect   Data Reviewed: I have personally reviewed following labs and imaging studies  CBC:  Recent Labs Lab 02/25/16 0348  02/26/16 0103 02/26/16 0812 02/26/16 1325 02/27/16 0330 02/28/16 0259  WBC 9.6  --  12.5* 12.9*  --  16.5* 11.8*  NEUTROABS  --   --   --  8.5*  --   --   --   HGB 7.8*  < > 4.7* 6.3*  6.6* 10.3* 9.0* 7.9*  HCT 22.7*  < > 14.0* 18.7*  19.6* 30.5* 25.9* 23.3*  MCV 85.3  --  87.0 88.2  --  82.7 86.6  PLT 265  --  304 223  --  198 209  < > = values in this interval not displayed. Basic Metabolic Panel:  Recent Labs Lab 02/23/16 0339 02/25/16 0348 02/26/16 0812 02/27/16 0330 02/28/16 0259  NA 132* 133* 132* 128* 128*  K 3.5 4.2 4.2 4.1 3.9  CL 102 102 104 100* 98*  CO2 24 26 22 23 24   GLUCOSE 275* 234* 204* 212* 162*  BUN 10 5* 12 15 10   CREATININE 0.97 0.88  0.89 1.01 0.85  CALCIUM 7.5* 7.6* 7.0* 7.0* 7.3*   GFR: Estimated Creatinine Clearance: 113.9 mL/min (by C-G formula based on Cr of 0.85). Liver Function Tests:  Recent Labs Lab 02/23/16 0339  AST 15  ALT 17  ALKPHOS 35*  BILITOT 0.5  PROT 4.6*  ALBUMIN 2.1*   No results for input(s): LIPASE, AMYLASE in the last 168 hours. No results for input(s): AMMONIA in the last 168 hours. Coagulation Profile:  Recent Labs Lab 02/21/16 1342  INR 1.11   Cardiac Enzymes:  Recent Labs Lab 02/21/16 2026 02/22/16 0400  TROPONINI 0.94* 0.85*   BNP (last 3 results) No results for input(s): PROBNP in the last 8760 hours. HbA1C: No results for input(s): HGBA1C in the last 72 hours. CBG:  Recent Labs Lab 02/27/16 0816 02/27/16 1224 02/27/16 1717 02/27/16 2146 02/28/16 0748  GLUCAP 207* 291* 256* 170* 178*   Lipid Profile: No results for input(s): CHOL, HDL, LDLCALC, TRIG, CHOLHDL, LDLDIRECT in the last 72 hours. Thyroid  Function Tests: No results for input(s): TSH, T4TOTAL, FREET4, T3FREE, THYROIDAB in the last 72 hours. Anemia Panel: No results for input(s): VITAMINB12, FOLATE, FERRITIN, TIBC, IRON, RETICCTPCT in the last 72 hours. Urine analysis:    Component Value Date/Time   COLORURINE YELLOW 02/26/2016 0229   APPEARANCEUR CLEAR 02/26/2016 0229   LABSPEC 1.014 02/26/2016 0229   PHURINE 6.0 02/26/2016 0229   GLUCOSEU 500* 02/26/2016 0229   HGBUR NEGATIVE 02/26/2016 0229   BILIRUBINUR NEGATIVE 02/26/2016 0229   KETONESUR NEGATIVE 02/26/2016 0229   PROTEINUR NEGATIVE 02/26/2016 0229   NITRITE NEGATIVE 02/26/2016 0229   LEUKOCYTESUR NEGATIVE 02/26/2016 0229   Sepsis Labs: (procalcitonin:4,lacticidven:4)  ) Recent Results (from the past 240 hour(s))  MRSA PCR Screening     Status: None   Collection Time: 02/21/16 10:17 PM  Result Value Ref Range Status   MRSA by PCR NEGATIVE NEGATIVE Final    Comment:        The GeneXpert MRSA Assay (FDA approved for NASAL specimens only), is one component of a comprehensive MRSA colonization surveillance program. It is not intended to diagnose MRSA infection nor to guide or monitor treatment for MRSA infections.   Culture, blood (routine x 2)     Status: None (Preliminary result)   Collection Time: 02/26/16  8:12 AM  Result Value Ref Range Status   Specimen Description BLOOD BLOOD LEFT HAND  Final   Special Requests IN PEDIATRIC BOTTLE 2.5CC  Final   Culture NO GROWTH 1 DAY  Final   Report Status PENDING  Incomplete  Culture, blood (routine x 2)     Status: None (Preliminary result)   Collection Time: 02/26/16  8:26 AM  Result Value Ref Range Status   Specimen Description BLOOD LEFT ANTECUBITAL  Final   Special Requests IN PEDIATRIC BOTTLE 1CC  Final   Culture NO GROWTH 1 DAY  Final   Report Status PENDING  Incomplete      Radiology Studies: No results found.   Scheduled Meds: . aspirin  325 mg Oral Daily  . atorvastatin  80 mg  Oral q1800  . docusate sodium  100 mg Oral Daily  . insulin aspart  0-15 Units Subcutaneous TID WC  . insulin aspart  0-5 Units Subcutaneous QHS  . insulin aspart  6 Units Subcutaneous TID WC  . insulin glargine  20 Units Subcutaneous QHS  . pantoprazole  40 mg Oral Daily  . propranolol  10 mg Oral TID   Continuous Infusions: . heparin  1,100 Units/hr (02/27/16 2258)     LOS: 7 days   Time Spent in minutes   30 minutes  Shanya Ferriss D.O. on 02/28/2016 at 10:38 AM  Between 7am to 7pm - Pager - [phone]  After 7pm go to [email or web address] - password [email or web address]  And look for the night coverage person covering for me after hours  [facility] Office  [phone]

## 2016-02-28 NOTE — Progress Notes (Signed)
PT Cancellation Note  Patient Details Name: Troy Yu MRN: [id number] DOB: [birth year]   Cancelled Treatment:    Reason Eval/Treat Not Completed: Pain limiting ability to participate; patient reports MD stated to keep his feet above his heart and wishes to hold off on PT today.  Noted low hemoglobin.  Will attempt again tomorrow.    Elray Mcgregor 02/28/2016, 2:03 PM  Sheran Lawless, PT [phone] 02/28/2016

## 2016-02-28 NOTE — Progress Notes (Signed)
   VASCULAR SURGERY ASSESSMENT & PLAN:   2 Days Post-Op s/pclosure of lateral fasciotomy site and placement of VAC on medial fasciotomy site.   VAC change tomorrow   Dr. Arbie Cookey has consulted Plastic Surgery to evaluate for possible STSG to medial fasciotomy site.   Elevate leg:   SUBJECTIVE: Pain adequately controlled  PHYSICAL EXAM: Filed Vitals:   02/28/16 0500 02/28/16 0600 02/28/16 0614 02/28/16 0700  BP:   94/79   Pulse: 85 81 88 81  Temp:      TempSrc:      Resp: 17 15 17 11   Height:      Weight:      SpO2: 99%  98% 98%   VAC with good seal. Right foot warm  LABS: Lab Results  Component Value Date   WBC 11.8* 02/28/2016   HGB 7.9* 02/28/2016   HCT 23.3* 02/28/2016   MCV 86.6 02/28/2016   PLT 209 02/28/2016   Lab Results  Component Value Date   CREATININE 0.85 02/28/2016   Lab Results  Component Value Date   INR 1.11 02/21/2016   CBG (last 3)   Recent Labs  02/27/16 1224 02/27/16 1717 02/27/16 2146  GLUCAP 291* 256* 170*    Principal Problem:   LV (left ventricular) mural thrombus (HCC) Active Problems:   Anterior subendocardial MI (HCC)   Right leg pain   Non-STEMI (non-ST elevated myocardial infarction) (HCC)   Elevated troponin   Abnormal EKG   Diabetes (HCC)   Acute MI (HCC)   History of embolectomy   Pain   Thromboembolism [facility])   Vascular occlusion   Fever    Cari Caraway Beeper: [phone] 02/28/2016

## 2016-02-29 DIAGNOSIS — I214 Non-ST elevation (NSTEMI) myocardial infarction: Secondary | ICD-10-CM | POA: Insufficient documentation

## 2016-02-29 LAB — BASIC METABOLIC PANEL
Anion gap: 6 (ref 5–15)
BUN: 8 mg/dL (ref 6–20)
CALCIUM: 7.6 mg/dL — AB (ref 8.9–10.3)
CHLORIDE: 100 mmol/L — AB (ref 101–111)
CO2: 24 mmol/L (ref 22–32)
CREATININE: 0.85 mg/dL (ref 0.61–1.24)
GFR calc Af Amer: >60 mL/min (ref >60)
GFR calc non Af Amer: >60 mL/min (ref >60)
GLUCOSE: 250 mg/dL — AB (ref 65–99)
Potassium: 3.9 mmol/L (ref 3.5–5.1)
Sodium: 130 mmol/L — ABNORMAL LOW (ref 135–145)

## 2016-02-29 LAB — TYPE AND SCREEN
ABO/RH(D): A POS
Antibody Screen: NEGATIVE
UNIT DIVISION: 0
UNIT DIVISION: 0
UNIT DIVISION: 0
Unit division: 0
Unit division: 0
Unit division: 0

## 2016-02-29 LAB — POCT I-STAT 7, (LYTES, BLD GAS, ICA,H+H)
BICARBONATE: 24.8 meq/L — AB (ref 20.0–24.0)
Calcium, Ion: 1.03 mmol/L — ABNORMAL LOW (ref 1.13–1.30)
HCT: 21 % — ABNORMAL LOW (ref 39.0–52.0)
HEMOGLOBIN: 7.1 g/dL — AB (ref 13.0–17.0)
O2 Saturation: 100 %
PO2 ART: 476 mmHg — AB (ref 80.0–100.0)
POTASSIUM: 4.3 mmol/L (ref 3.5–5.1)
SODIUM: 131 mmol/L — AB (ref 135–145)
TCO2: 26 mmol/L (ref 0–100)
pCO2 arterial: 38.8 mmHg (ref 35.0–45.0)
pH, Arterial: 7.413 (ref 7.350–7.450)

## 2016-02-29 LAB — GLUCOSE, CAPILLARY
GLUCOSE-CAPILLARY: 151 mg/dL — AB (ref 65–99)
GLUCOSE-CAPILLARY: 186 mg/dL — AB (ref 65–99)
Glucose-Capillary: 220 mg/dL — ABNORMAL HIGH (ref 65–99)
Glucose-Capillary: 163 mg/dL — ABNORMAL HIGH (ref 65–99)

## 2016-02-29 LAB — HEPARIN LEVEL (UNFRACTIONATED): Heparin Unfractionated: 0.52 IU/mL (ref 0.30–0.70)

## 2016-02-29 LAB — CBC
HEMATOCRIT: 22.9 % — AB (ref 39.0–52.0)
HEMOGLOBIN: 7.7 g/dL — AB (ref 13.0–17.0)
MCH: 30 pg (ref 26.0–34.0)
MCHC: 33.6 g/dL (ref 30.0–36.0)
MCV: 89.1 fL (ref 78.0–100.0)
Platelets: 239 10*3/uL (ref 150–400)
RBC: 2.57 MIL/uL — ABNORMAL LOW (ref 4.22–5.81)
RDW: 17.8 % — AB (ref 11.5–15.5)
WBC: 9.7 10*3/uL (ref 4.0–10.5)

## 2016-02-29 LAB — HEMOGLOBIN AND HEMATOCRIT, BLOOD
HCT: 19.6 % — ABNORMAL LOW (ref 39.0–52.0)
Hemoglobin: 6.6 g/dL — CL (ref 13.0–17.0)

## 2016-02-29 MED ORDER — SODIUM CHLORIDE 0.9 % IV SOLN
INTRAVENOUS | Status: DC | PRN
  Administered 2016-03-02: 250 mL via INTRAVENOUS
  Administered 2016-03-10: 13:00:00 500 mL via INTRAVENOUS

## 2016-02-29 MED ORDER — HYDROMORPHONE HCL 1 MG/ML IJ SOLN
1.0000 mg | Freq: Once | INTRAMUSCULAR | Status: AC
  Administered 2016-02-29: 1 mg via INTRAVENOUS

## 2016-02-29 NOTE — Progress Notes (Signed)
   VASCULAR SURGERY ASSESSMENT & PLAN  3 Days Post-Op s/p closure of lateral fasciotomy site and placement of VAC on medial fasciotomy site.   VAC change today. If I am available I will examine wound. I have left a message to page me.    Dr. Arbie Cookey has consulted Plastic Surgery to evaluate for possible STSG to medial fasciotomy site.   Elevate leg:  SUBJECTIVE: Pain better  PHYSICAL EXAM: Filed Vitals:   02/29/16 0500 02/29/16 0600 02/29/16 0700 02/29/16 0747  BP:  109/64  102/64  Pulse: 93 86 83 83  Temp:    98 F (36.7 C)  TempSrc:    Oral  Resp: 16 12 11 12   Height:      Weight:      SpO2: 97% 100% 99% 100%   VAC with good seal.   LABS: Lab Results  Component Value Date   WBC 9.7 02/29/2016   HGB 7.7* 02/29/2016   HCT 22.9* 02/29/2016   MCV 89.1 02/29/2016   PLT 239 02/29/2016   Lab Results  Component Value Date   CREATININE 0.85 02/29/2016   Lab Results  Component Value Date   INR 1.11 02/21/2016   CBG (last 3)   Recent Labs  02/28/16 1629 02/28/16 2113 02/29/16 0750  GLUCAP 145* 144* 220*    Principal Problem:   LV (left ventricular) mural thrombus (HCC) Active Problems:   Anterior subendocardial MI (HCC)   Right leg pain   Non-STEMI (non-ST elevated myocardial infarction) (HCC)   Elevated troponin   Abnormal EKG   Diabetes (HCC)   Acute MI (HCC)   History of embolectomy   Pain   Thromboembolism (HCC)   Vascular occlusion   Fever   NSTEMI (non-ST elevated myocardial infarction) [facility])    Cari Caraway Beeper: [phone] 02/29/2016

## 2016-02-29 NOTE — Progress Notes (Signed)
Called to pt room stating that his "leg was bleeding." RN in to assess, noted that wound vac stated canister full and suction held via the machine. Canister changed and wound vac reset. Wound vac continued to be unable to achieve suction on dressing. RN noted that drainage from wound was leaking down and outside of vac dressing. WOC RN called and she advised for dressing to be changed to achieve suction, stating that a clot may have formed on the foam dressing preventing it from obtaining suction. Pt refused for bedside RN to change dressing, stating he wanted to speak with the surgeon. Surgeon paged and made aware of situation. New orders received to attempt to change wound vac dressing but if unable to get vac to suction then to do wet to dry dressing changes. Pt and sons at bedside were explained in detail what the surgeon had stated, the cause of the wound oozing (pt on heparin gtt) and the appropriate coarse of action by 3 different RNs. Pt agrees to have dressing changed. It was determined that wound was oozing and creating clots that would not be feasible for the vac to be replaced d/t having to change the dressing frequently, which causes the patient much pain, and also because would not be able to achieve suction. Wet to dry dressing placed over site with hydrogel per MD order. Sons present during this event. Will continue to monitor closely.

## 2016-02-29 NOTE — Progress Notes (Signed)
PT Cancellation Note  Patient Details Name: Troy Yu MRN: [id number] DOB: 1961/06/12   Cancelled Treatment:    Reason Eval/Treat Not Completed: Pain limiting ability to participate; attempted to see patient earlier and was having dressing change.  Attempted again and pt relates too much pain to participate.  Spent time with OT to encourage participation, educating pt on detrimental effects of prolonged bedrest, but he continued to refuse.  Will attempt again another day.    Elray Mcgregor 02/29/2016, 3:34 PM  Sheran Lawless, PT [phone] 02/29/2016

## 2016-02-29 NOTE — Progress Notes (Addendum)
SUBJECTIVE: no chest pain or dyspnea.   Tele: sinus  BP 109/64 mmHg  Pulse 83  Temp(Src) 98.6 F (37 C) (Oral)  Resp 11  Ht 5\' 8"  (1.727 m)  Wt 227 lb 15.3 oz (103.4 kg)  BMI 34.67 kg/m2  SpO2 99%  Intake/Output Summary (Last 24 hours) at 02/29/16 0737 Last data filed at 02/29/16 0700  Gross per 24 hour  Intake   2333 ml  Output   5065 ml  Net  -2732 ml    PHYSICAL EXAM General: Well developed, well nourished, in no acute distress. Alert and oriented x 3.  Psych:  Good affect, responds appropriately Neck: No JVD. No masses noted.  Lungs: Clear bilaterally with no wheezes or rhonci noted.  Heart: RRR with no murmurs noted. Abdomen: Bowel sounds are present. Soft, non-tender.  Extremities: wound vac right leg   LABS: Basic Metabolic Panel:  Recent Labs  73/22/02 0259 02/29/16 0509  NA 128* 130*  K 3.9 3.9  CL 98* 100*  CO2 24 24  GLUCOSE 162* 250*  BUN 10 8  CREATININE 0.85 0.85  CALCIUM 7.3* 7.6*   CBC:  Recent Labs  02/26/16 0812  02/28/16 0259 02/28/16 1356 02/29/16 0509  WBC 12.9*  < > 11.8*  --  9.7  NEUTROABS 8.5*  --   --   --   --   HGB 6.3*  6.6*  < > 7.9* 7.8* 7.7*  HCT 18.7*  19.6*  < > 23.3* 23.0* 22.9*  MCV 88.2  < > 86.6  --  89.1  PLT 223  < > 209  --  239  < > = values in this interval not displayed.  Current Meds: . aspirin  325 mg Oral Daily  . atorvastatin  80 mg Oral q1800  . docusate sodium  100 mg Oral Daily  . insulin aspart  0-15 Units Subcutaneous TID WC  . insulin aspart  0-5 Units Subcutaneous QHS  . insulin aspart  6 Units Subcutaneous TID WC  . insulin glargine  20 Units Subcutaneous QHS  . pantoprazole  40 mg Oral Daily  . propranolol  10 mg Oral TID     ASSESSMENT AND PLAN: 55 y.o. male admitted with chest pain and right leg pain. Cardiology was called to evaluate the patient, and he was felt to have suffered a subacute anterior MI. STAT echo was ordered and EDP advised to order an arterial duplex with  recommendation to consult vascular surgery. IV heparin ordered and the patient was transferred to [facility] for further evaluation by vascular surgery, as it was felt that he had a right femoral embolism with concern for threatened limb. Dr Imogene Burn subsequently performed a right femoral embolectomy and right calf compartment fasciotomies x 4 . On 02/22/16, the patient complained of loss of sensation to RLE, and the patient reports he was in severe pain. He was re-evaluated by Dr. Imogene Burn and was felt to have excessive bleeding in his calf related to full anti-coagulation. He was taken back to surgery for evacuation of hematoma and VAC dressings. Cardiology following for management of MI.    1. Subacute anterior MI: likely occurred 2-3 weeks prior to arrival. Echo 02/21/2016 EF 30-35%, anteroapical infarct with large thrombus burden at apex, mobile and high embolic potential. He is still on a heparin drip. Would anticipate initiation of coumadin when ok with vascular surgery. Once his right lower extremity has healed, will need further cardiac cath. Add ACE when BP allows.  Continue ASA, statin and beta blocker.   2. LV thrombus: He is on heparin. Will need coumadin when ok with Vascular surgery.   3. Ischemic leg secondary to embolism: Per vascular surgery. On heparin drip  4. Postop anemia: S/p recent PRBCs. May require additional PRBCs.   Verne Carrow  7/3/20177:37 AM

## 2016-02-29 NOTE — Consult Note (Signed)
WOC wound follow up Dr. Edilia Bo in to remove dressing from medial fasciotomy sites Lateral fasciotomy site is closed now Wound type: surgical  Measurement: see previous notes Wound bed: viable red bleeding muscle Drainage (amount, consistency, odor) sanguinous in VAC canister and with dressing change Periwound: intact  Dressing procedure/placement/frequency: 2pc of Mepitel placed over the muscle, 1pc of black foam used over top of Mepitel. Drape and seal at .  Patient received IV pain meds 1mg  Dilaudid prior to dressing change per bedside nurse.   Received an additionally  1mg  Dilaudid given per bedside nurse with NPWT VAC dressing re-application.  Patient has lots of questions during dressing change and I have tried to answer them.  VVS will need to follow up on his questions related to his second surgery.  Family at bedside throughout dressing change.  WOC team will follow along but will need to be notified with plans for dressing changes ahead of time.  Patient may desire surgeon to remove dressing.  Osiris Charles [place] RN,CWOCN [phone]

## 2016-02-29 NOTE — Progress Notes (Signed)
OT Cancellation Note  Patient Details Name: Troy Yu MRN: [id number] DOB: [birth year]   Cancelled Treatment:    Reason Eval/Treat Not Completed: Pain limiting ability to participate.  Pt reports his pain as 22/10 and constant.   He adamantly refused PT/OT despite risks of immobility being explained to him.  When he is pushed/encouraged, he feigns sleep.  Will reattempt  [employer], OTR/L [phone]   Jeani Hawking M 02/29/2016, 2:26 PM

## 2016-02-29 NOTE — Progress Notes (Signed)
ANTICOAGULATION CONSULT NOTE - Follow Up Consult  Pharmacy Consult for Heparin  Indication: Subacute MI/vascular occlusion  No Known Allergies  Patient Measurements: Height: 5\' 8"  (172.7 cm) Weight: 227 lb 15.3 oz (103.4 kg) IBW/kg (Calculated) : 68.4  Heparin dosing wt: 87 kg  Vital Signs: Temp: 98.6 F (37 C) (07/03 0400) Temp Source: Oral (07/03 0400) BP: 109/64 mmHg (07/03 0600) Pulse Rate: 83 (07/03 0700)  Labs:  Recent Labs  02/27/16 0330 02/27/16 1158 02/28/16 0259 02/28/16 1356 02/29/16 0509  HGB 9.0*  --  7.9* 7.8* 7.7*  HCT 25.9*  --  23.3* 23.0* 22.9*  PLT 198  --  209  --  239  HEPARINUNFRC 0.49 0.60 0.58  --  0.52  CREATININE 1.01  --  0.85  --  0.85    Estimated Creatinine Clearance: 113.1 mL/min (by C-G formula based on Cr of 0.85).  Medication Infusion: . heparin 1,100 Units/hr (02/28/16 1948)   Assessment: 55 yo male with LV thrombus and R femoral embolism s/p embolectomy on 6/25 and now s/p hematoma evacuation. Pt s/p closure of lateral fasciotomy and VAC placement to medial fasciotomy.  HL is at goal at 0.58. Hb 10.3 >> 4.7 s/p 5 units PRBC.  Today Hgb 7.7, platelets are stable. Spoke with RN and wound VAC does have some drainage but not overtly bleeding.    Goal of Therapy:  Heparin level 0.3-0.7 units/ml, attempt middle range with bleeding risk Monitor platelets by anticoagulation protocol: Yes   Plan:  -Continue IV heparin at current rate. -Check HL and CBC with am labs -F/u plans for oral anticoagulation eventually -Daily HL and CBC -Monitor for s/sx of bleeding  Tad Moore, BCPS  Clinical Pharmacist Pager [phone]  02/29/2016 7:39 AM

## 2016-02-29 NOTE — Progress Notes (Signed)
PROGRESS NOTE    Troy Yu  ZOX:[id number] DOB: 11/04/1960 DOA: 02/21/2016 PCP: No primary care provider on file.   Brief Narrative:  Troy Yu is an 55 y.o. male who was in his usual state of health until about 2 weeks ago when he had substernal chest pain that lasted about 48 hours.The pain went away after he vomited, and he attributed his symptoms to a prolonged religious fast. He then come to the [facility] ED on 02/21/16 with severe RLE pain associated with paresthesias. Upon presentation, he was found to have an abnormal EKG and elevated troponin. Cardiology was subsequently called to evaluate the patient, and he was felt to have suffered a subacute anterior MI. STAT echo was ordered and EDP advised to order an arterial duplex with recommendation to consult vascular surgery. IV heparin ordered and the patient was transferred to [facility] for further evaluation by vascular surgery, as it was felt that he had a right femoral embolism with concern for threatened limb. Dr Imogene Burn subsequently performed a right femoral embolectomy and right calf compartment fasciotomies x 4 . At 4:16 a.m. On 02/22/16, the patient complained of loss of sensation to RLE, and the patient reports he was in severe pain. He was given pain medication which did not alleviate his symptoms. He was re-evaluated by Dr. Imogene Burn at 7:33 a.m. Who removed the ACE wrap from his RLE which relieved the pain, and the patient was felt to have excessive bleeding in his calf related to full anti-coagulation. He was taken back to surgery for evacuation of hematoma and VAC dressings. Cardiologist continues to follow for management of his subacute MI. Patient underwent closure of lateral fasciotomy. Continues to have wound vac on medial aspect. Plastic surgery consulted.   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  -Case taken over by Dr. Arbie Cookey after patient requested a second opinion.  -Wound VAC in place, medial aspect. To be changed tomorrow, 02/29/2016 -Lipids well controlled, cholesterol 116, LDL 60.  -s/p closure of lateral fasciotomy site and placement of VAC on medial fasciotomy site -Plastic surgery consulted by vascular for possible STSG to medial fasciotomy site  Symptomatic anemia/ Postoperative anemia/ Anemia secondary to blood loss -During hospitalization, Hemglobin dropped to 4.7.  Patient became hypotensive, lethargic -Currently on heparin -Has received a total of 5u PRBCs -Hemoglobin currently 7.7 (was up to 10.3) -Continue to monitor CBC -Patient will like need additional transfusions  ?Post-transfusion fever -Continue tylenol PRN -Continue to monitor closely  Syncope and orthostasis -Likely secondary to blood loss -Continue to monitor closely  Anterior subendocardial MI (HCC)/Abnormal EKG/Elevated troponin -On therapeutic dose heparin.  -Cardiology consulted and appreciated -Continue Inderal -ACE-I to be started when BP able to tolerate -Will eventually need cardiac cath  Newly diagnosed diabetes mellitus/Hyperglycemia -Hemoglobin A1c 9.2.  -Continue lantus, ISS and CBG monitoring.  -Diabetes coordinator consulted  Hyponatremia -Possibly from hyperglycemia. Correct blood glucose and monitor. -Continue to monitor BMP  Constipation -continue colace. Patient declined suppository or enema at this time. -Fleet and dulcolax supp PRN  DVT Prophylaxis  heparin  Code Status: Full  Family Communication: Son at bedside  Disposition Plan: Admitted. Pending Plastic surg consult, further recommendations from vascular surgery. Continue to monitor hemoglobin.  Consultants Cardiology  Vascular Surgery Plastic surgery  Procedures  Right femoral embolectomy Right calf four compartment fasciotomies  Evacuation of hematoma from bilateral right calf fasciotomies Placement of negative pressure dressings x2 Closure of lateral fasciotomy site and placement of VAC on medial fasciotomy site  Antibiotics   Anti-infectives    Start     Dose/Rate Route Frequency Ordered Stop   02/26/16 0930  [MAR Hold]  cefUROXime (ZINACEF) 1.5 g in dextrose 5 % 50 mL IVPB     (MAR Hold since 02/26/16 0928)   1.5 g 100 mL/hr over 30 Minutes Intravenous To [facility]Stay Surgical 02/25/16 1055 02/26/16 1120   02/22/16 0800  cefUROXime (ZINACEF) 1.5 g in dextrose 5 % 50 mL IVPB     1.5 g 100 mL/hr over 30 Minutes Intravenous Every 12 hours 02/21/16 2115 02/22/16 2140   02/22/16 0600  cefUROXime (ZINACEF) injection 1.5 g     1.5 g Intramuscular On call to O.R. 02/21/16 1523 02/21/16 1713      Subjective:   Troy Yu seen and examined today. Patient thought this was Sunday evening.  Denies chest pain, shortness of breath, abdominal pain, abdominal pain, dizziness, headache.  Continues to complain of leg pain, especially from the staples, wonders if they can be removed. Feels the pain medicines help.   Objective:   Filed Vitals:   02/29/16 0500 02/29/16 0600 02/29/16 0700 02/29/16 0747  BP:  109/64  102/64  Pulse: 93 86 83 83  Temp:    98 F (36.7 C)  TempSrc:    Oral  Resp: 16 12 11 12   Height:      Weight:      SpO2: 97% 100% 99% 100%    Intake/Output Summary (Last 24 hours) at 02/29/16 0945 Last data filed at 02/29/16 0700  Gross per 24 hour  Intake   1471 ml  Output   3990 ml  Net  -2519 ml   Filed Weights   02/27/16 0325 02/28/16 0255 02/29/16 0303  Weight: 104.6 kg (230 lb 9.6 oz) 104.9 kg (231 lb 4.2 oz) 103.4 kg (227 lb 15.3 oz)    Exam  General: Well developed, well nourished, no distress  HEENT: NCAT, mucous membranes moist.   Cardiovascular: S1 S2  auscultated, no murmurs, RRR  Respiratory: Clear to auscultation bilaterally with equal chest rise. No wheezing.   Abdomen: Soft, nontender, nondistended, + bowel sounds  Extremities: warm dry without cyanosis clubbing or edema. Wound vac RLE medial aspect, lateral aspect- staples noted  Neuro: AAOx3, slightly confused on the day, nonfocal  Psych: Appropriate mood and affect, pleasant   Data Reviewed: I have personally reviewed following labs and imaging studies  CBC:  Recent Labs Lab 02/26/16 0103 02/26/16 0812 02/26/16 1325 02/27/16 0330 02/28/16 0259 02/28/16 1356 02/29/16 0509  WBC 12.5* 12.9*  --  16.5* 11.8*  --  9.7  NEUTROABS  --  8.5*  --   --   --   --   --   HGB 4.7* 6.6*  6.3* 10.3* 9.0* 7.9* 7.8* 7.7*  HCT 14.0* 19.6*  18.7* 30.5* 25.9* 23.3* 23.0* 22.9*  MCV 87.0 88.2  --  82.7 86.6  --  89.1  PLT 304 223  --  198 209  --  239   Basic Metabolic Panel:  Recent Labs Lab 02/25/16 0348 02/26/16 0812 02/27/16 0330 02/28/16 0259 02/29/16 0509  NA 133* 132* 128* 128* 130*  K 4.2 4.2 4.1 3.9 3.9  CL 102 104 100* 98* 100*  CO2  GLUCOSE 234* 204* 212* 162* 250*  BUN 5* CREATININE 0.88 0.89 1.01 0.85 0.85  CALCIUM 7.6* 7.0* 7.0* 7.3* 7.6*   GFR: Estimated Creatinine Clearance: 113.1 mL/min (by C-G formula based on Cr of 0.85). Liver Function Tests:  Recent Labs Lab 02/23/16 0339  AST 15  ALT 17  ALKPHOS 35*  BILITOT 0.5  PROT 4.6*  ALBUMIN 2.1*   No results for input(s): LIPASE, AMYLASE in the last 168 hours. No results for input(s): AMMONIA in the last 168 hours. Coagulation Profile: No results for input(s): INR, PROTIME in the last 168 hours. Cardiac Enzymes: No results for input(s): CKTOTAL, CKMB, CKMBINDEX, TROPONINI in the last 168 hours. BNP (last 3 results) No results for input(s): PROBNP in the last 8760 hours. HbA1C: No results for input(s): HGBA1C in the last 72 hours. CBG:  Recent Labs Lab  02/28/16 0748 02/28/16 1146 02/28/16 1629 02/28/16 2113 02/29/16 0750  GLUCAP 178* 242* 145* 144* 220*   Lipid Profile: No results for input(s): CHOL, HDL, LDLCALC, TRIG, CHOLHDL, LDLDIRECT in the last 72 hours. Thyroid Function Tests: No results for input(s): TSH, T4TOTAL, FREET4, T3FREE, THYROIDAB in the last 72 hours. Anemia Panel: No results for input(s): VITAMINB12, FOLATE, FERRITIN, TIBC, IRON, RETICCTPCT in the last 72 hours. Urine analysis:    Component Value Date/Time   COLORURINE YELLOW 02/26/2016 0229   APPEARANCEUR CLEAR 02/26/2016 0229   LABSPEC 1.014 02/26/2016 0229   PHURINE 6.0 02/26/2016 0229   GLUCOSEU 500* 02/26/2016 0229   HGBUR NEGATIVE 02/26/2016 0229   BILIRUBINUR NEGATIVE 02/26/2016 0229   KETONESUR NEGATIVE 02/26/2016 0229   PROTEINUR NEGATIVE 02/26/2016 0229   NITRITE NEGATIVE 02/26/2016 0229   LEUKOCYTESUR NEGATIVE 02/26/2016 0229   Sepsis Labs: (procalcitonin:4,lacticidven:4)  ) Recent Results (from the past 240 hour(s))  MRSA PCR Screening     Status: None   Collection Time: 02/21/16 10:17 PM  Result Value Ref Range Status   MRSA by PCR NEGATIVE NEGATIVE Final    Comment:        The GeneXpert MRSA Assay (FDA approved for NASAL specimens only), is one component of a comprehensive MRSA colonization surveillance program. It is not intended to diagnose MRSA infection nor to guide or monitor treatment for MRSA infections.   Culture, blood (routine x 2)     Status: None (Preliminary result)   Collection Time: 02/26/16  8:12 AM  Result Value Ref Range Status   Specimen Description BLOOD BLOOD LEFT HAND  Final   Special Requests IN PEDIATRIC BOTTLE 2.5CC  Final   Culture NO GROWTH 2 DAYS  Final   Report Status PENDING  Incomplete  Culture, blood (routine x 2)     Status: None (Preliminary result)   Collection Time: 02/26/16  8:26 AM  Result Value Ref Range Status   Specimen Description BLOOD LEFT ANTECUBITAL  Final   Special  Requests IN PEDIATRIC BOTTLE 1CC  Final   Culture NO GROWTH 2 DAYS  Final   Report Status PENDING  Incomplete      Radiology Studies: No results found.   Scheduled Meds: . aspirin  325 mg Oral Daily  . atorvastatin  80 mg Oral q1800  . docusate sodium  100 mg Oral Daily  . insulin aspart  0-15 Units Subcutaneous TID WC  . insulin aspart  0-5 Units Subcutaneous QHS  . insulin aspart  6 Units Subcutaneous TID WC  . insulin glargine  20 Units Subcutaneous QHS  .  pantoprazole  40 mg Oral Daily  . propranolol  10 mg Oral TID   Continuous Infusions: . heparin 1,100 Units/hr (02/28/16 1948)     LOS: 8 days   Time Spent in minutes   30 minutes  Cambree Hendrix D.O. on 02/29/2016 at 9:45 AM  Between 7am to 7pm - Pager - [phone]  After 7pm go to [email or web address] - password [email or web address]  And look for the night coverage person covering for me after hours  [facility] Office  [phone]

## 2016-03-01 DIAGNOSIS — I2102 ST elevation (STEMI) myocardial infarction involving left anterior descending coronary artery: Secondary | ICD-10-CM

## 2016-03-01 LAB — BASIC METABOLIC PANEL
ANION GAP: 6 (ref 5–15)
BUN: 11 mg/dL (ref 6–20)
CO2: 24 mmol/L (ref 22–32)
Calcium: 7.3 mg/dL — ABNORMAL LOW (ref 8.9–10.3)
Chloride: 96 mmol/L — ABNORMAL LOW (ref 101–111)
Creatinine, Ser: 0.9 mg/dL (ref 0.61–1.24)
GLUCOSE: 194 mg/dL — AB (ref 65–99)
POTASSIUM: 4.5 mmol/L (ref 3.5–5.1)
Sodium: 126 mmol/L — ABNORMAL LOW (ref 135–145)

## 2016-03-01 LAB — HEMOGLOBIN AND HEMATOCRIT, BLOOD
HCT: 16.9 % — ABNORMAL LOW (ref 39.0–52.0)
HCT: 25 % — ABNORMAL LOW (ref 39.0–52.0)
HEMOGLOBIN: 8.7 g/dL — AB (ref 13.0–17.0)
Hemoglobin: 5.5 g/dL — CL (ref 13.0–17.0)

## 2016-03-01 LAB — PREPARE RBC (CROSSMATCH)

## 2016-03-01 LAB — GLUCOSE, CAPILLARY
GLUCOSE-CAPILLARY: 151 mg/dL — AB (ref 65–99)
Glucose-Capillary: 186 mg/dL — ABNORMAL HIGH (ref 65–99)
Glucose-Capillary: 164 mg/dL — ABNORMAL HIGH (ref 65–99)
Glucose-Capillary: 183 mg/dL — ABNORMAL HIGH (ref 65–99)

## 2016-03-01 LAB — HEPARIN LEVEL (UNFRACTIONATED)
HEPARIN UNFRACTIONATED: 0.34 [IU]/mL (ref 0.30–0.70)
HEPARIN UNFRACTIONATED: 0.32 [IU]/mL (ref 0.30–0.70)

## 2016-03-01 MED ORDER — HYDROMORPHONE HCL 1 MG/ML IJ SOLN
1.0000 mg | Freq: Once | INTRAMUSCULAR | Status: AC
  Administered 2016-03-01: 1 mg via INTRAVENOUS

## 2016-03-01 MED ORDER — SODIUM CHLORIDE 0.9 % IV SOLN
Freq: Once | INTRAVENOUS | Status: AC
  Administered 2016-03-01: 05:00:00 via INTRAVENOUS

## 2016-03-01 MED ORDER — SODIUM CHLORIDE 0.9 % IV SOLN
Freq: Once | INTRAVENOUS | Status: AC
  Administered 2016-03-01: 03:00:00 via INTRAVENOUS

## 2016-03-01 NOTE — Progress Notes (Signed)
Patient's right leg wound has continuous bleed and an area with continuous trickle of blood. Bleeding has been present since day shift and wet to dry dressing changes done per Md's order as wound vac therapy is not effective because of formation of clots in wound vac suction tubing. Outer dressing has been changed three times since 7pm. Patient was medicated prior to each dressing change. Dr fields was called for a low hemoglobin of 5.5 and concern about the continuous bleeding. Heparin was ordered to be stopped for an hour, ace wrap application, and transfusion of 2units prbc. Phamaracy was called to re-dose heparin rate.  Patient has palpable pedal pulses and warm foot.Will continue to monitor

## 2016-03-01 NOTE — Progress Notes (Signed)
PT Cancellation Note  Patient Details Name: Troy Yu MRN: [id number] DOB: 01/28/1961   Cancelled Treatment:    Reason Eval/Treat Not Completed: Patient declined, no reason specified. Pt just got dressing changed and stated he didn't want it to bleed.  "Let's wait until tomorrow". 03/01/2016  [name], PT [phone]  (pager)   [name], [name] 03/01/2016, 4:12 PM

## 2016-03-01 NOTE — Progress Notes (Addendum)
ANTICOAGULATION CONSULT NOTE - Follow Up Consult  Pharmacy Consult for Heparin  Indication:  LV thrombus and R femoral embolism s/p embolectomy  No Known Allergies  Patient Measurements: Height: 5\' 8"  (172.7 cm) Weight: 227 lb 15.3 oz (103.4 kg) IBW/kg (Calculated) : 68.4  Heparin dosing wt: 87 kg  Vital Signs: Temp: 98.3 F (36.8 C) (07/04 0847) Temp Source: Oral (07/04 0847) BP: 101/65 mmHg (07/04 0847) Pulse Rate: 87 (07/04 0847)  Labs:  Recent Labs  02/28/16 0259 02/28/16 1356 02/29/16 0509 02/29/16 2349 03/01/16 0904  HGB 7.9* 7.8* 7.7* 5.5*  --   HCT 23.3* 23.0* 22.9* 16.9*  --   PLT 209  --  239  --   --   HEPARINUNFRC 0.58  --  0.52  --  0.34  CREATININE 0.85  --  0.85  --  0.90    Estimated Creatinine Clearance: 106.8 mL/min (by C-G formula based on Cr of 0.9).  Medication Infusion: . heparin 900 Units/hr (03/01/16 0150)   Assessment: 56 yo male with LV thrombus and R femoral embolism s/p embolectomy on 6/25 and now s/p hematoma evacuation. Pt s/p closure of lateral fasciotomy and VAC placement to medial fasciotomy.   This am RN called and pt is bleeding from wound and Hgb down to 5.5. He spoke with vascular surgeon about an hour ago who wanted to hold heparin x 1 hour then restart at reduced rate with decrease goal 0.3-0.5. Heparin has been held from 7628-3151. MD aware of low Hgb and bleeding and will follow closely but still wants to restart heparin. PRBC also ordered.  HL is now at lower end of goal range at 0.34. Received 2 units PRBC this am and repeat hgb up to 8.7. Patient is refusing wound check by vascular.   Goal of Therapy:  Heparin level 0.3-0.5 units/ml (lower goal with bleeding)  Monitor platelets by anticoagulation protocol: Yes   Plan:  -Continue IV heparin at 900 units/hr -Will f/u 6 hr heparin level to confirm  -F/u CBC and bleeding from wounds  Edy Belt K. Bonnye Fava, PharmD, BCPS, CPP Clinical Pharmacist Pager: [phone] Phone:  [phone] 03/01/2016 10:13 AM

## 2016-03-01 NOTE — Progress Notes (Signed)
Patient ID: Troy Yu, male   DOB: 08-27-1961, 55 y.o.   MRN: [id number]     SUBJECTIVE: no chest pain or dyspnea.   Tele: sinus  BP 113/67 mmHg  Pulse 87  Temp(Src) 98.4 F (36.9 C) (Oral)  Resp 14  Ht  (1.727 m)  Wt 227 lb 15.3 oz (103.4 kg)  BMI 34.67 kg/m2  SpO2 98%  Intake/Output Summary (Last 24 hours) at 03/01/16 0826 Last data filed at 03/01/16 6213  Gross per 24 hour  Intake 2336.17 ml  Output   2625 ml  Net -288.83 ml    PHYSICAL EXAM General: Well developed, well nourished, in no acute distress. Alert and oriented x 3.  Psych:  Good affect, responds appropriately Neck: No JVD. No masses noted.  Lungs: Clear bilaterally with no wheezes or rhonci noted.  Heart: RRR with no murmurs noted. Abdomen: Bowel sounds are present. Soft, non-tender.  Extremities: right leg wound vac off wrapped in ACE with fasciotomy   LABS: Basic Metabolic Panel:  Recent Labs  08/65/78 0259 02/29/16 0509  NA 128* 130*  K 3.9 3.9  CL 98* 100*  CO2 24 24  GLUCOSE 162* 250*  BUN 10 8  CREATININE 0.85 0.85  CALCIUM 7.3* 7.6*   CBC:  Recent Labs  02/28/16 0259  02/29/16 0509 02/29/16 2349  WBC 11.8*  --  9.7  --   HGB 7.9*  < > 7.7* 5.5*  HCT 23.3*  < > 22.9* 16.9*  MCV 86.6  --  89.1  --   PLT 209  --  239  --   < > = values in this interval not displayed.  Current Meds: . aspirin  325 mg Oral Daily  . atorvastatin  80 mg Oral q1800  . docusate sodium  100 mg Oral Daily  . insulin aspart  0-15 Units Subcutaneous TID WC  . insulin aspart  0-5 Units Subcutaneous QHS  . insulin aspart  6 Units Subcutaneous TID WC  . insulin glargine  20 Units Subcutaneous QHS  . pantoprazole  40 mg Oral Daily  . propranolol  10 mg Oral TID     ASSESSMENT AND PLAN: 55 y.o. male admitted with chest pain and right leg pain. Cardiology was called to evaluate the patient, and he was felt to have suffered a subacute anterior MI. STAT echo was ordered and EDP advised to  order an arterial duplex with recommendation to consult vascular surgery. IV heparin ordered and the patient was transferred to [facility] for further evaluation by vascular surgery, as it was felt that he had a right femoral embolism with concern for threatened limb. Dr Imogene Burn subsequently performed a right femoral embolectomy and right calf compartment fasciotomies x 4 . On 02/22/16, the patient complained of loss of sensation to RLE, and the patient reports he was in severe pain. He was re-evaluated by Dr. Imogene Burn and was felt to have excessive bleeding in his calf related to full anti-coagulation. He was taken back to surgery for evacuation of hematoma and VAC dressings. Cardiology following for management of MI.    1. Subacute anterior MI: likely occurred 2-3 weeks prior to arrival. Echo 02/21/2016 EF 30-35%, anteroapical infarct with large thrombus burden at apex, mobile and high embolic potential. He is still on a heparin drip. Would anticipate initiation of coumadin when ok with vascular surgery. Once his right lower extremity has healed, will need further cardiac cath. Add ACE when BP allows. Continue ASA, statin and beta blocker. Seems  to have spontaneous diuresis no signs of CHF   2. LV thrombus: He is on heparin. Will need coumadin when ok with Vascular surgery.   3. Ischemic leg secondary to embolism: Per vascular surgery. On heparin drip  4. Postop anemia: S/p recent PRBCs. May require additional PRBCs. Significant issues with LE bleeding and need for transfusions  Heparin held for an hour yesterday Hemostasis With ACE wrap tightness limited by patient pain   Charlton Haws  7/4/20178:26 AM

## 2016-03-01 NOTE — Progress Notes (Signed)
[facility]  Subjective  - complains of pain right leg   Objective 113/67 87 98.4 F (36.9 C) (Oral) 14 98%  Intake/Output Summary (Last 24 hours) at 03/01/16 0825 Last data filed at 03/01/16 0655  Gross per 24 hour  Intake 2336.17 ml  Output   2625 ml  Net -288.83 ml   Right leg fasciotomy dressing currently dry, right foot warm, edematous 2+ DP left foot, dried blood on dressing  Assessment/Planning: Pt s/p embolectomy and fasiotomy.  Refusing to let me look at wound today.   Will try to be available for dressing change later today Needs repeat hemoglobin tomorrow post transfusion  Fabienne Bruns 03/01/2016 8:25 AM --  Laboratory Lab Results:  Recent Labs  02/28/16 0259  02/29/16 0509 02/29/16 2349  WBC 11.8*  --  9.7  --   HGB 7.9*  < > 7.7* 5.5*  HCT 23.3*  < > 22.9* 16.9*  PLT 209  --  239  --   < > = values in this interval not displayed. BMET  Recent Labs  02/28/16 0259 02/29/16 0509  NA 128* 130*  K 3.9 3.9  CL 98* 100*  CO2 24 24  GLUCOSE 162* 250*  BUN 10 8  CREATININE 0.85 0.85  CALCIUM 7.3* 7.6*    COAG Lab Results  Component Value Date   INR 1.11 02/21/2016   No results found for: PTT

## 2016-03-01 NOTE — Progress Notes (Signed)
Pt refuses to have dressing changed.Dressing has become moist and saturated ACE bandage.  MD notified. Will continue to monitor.

## 2016-03-01 NOTE — Progress Notes (Signed)
OT Cancellation Note  Patient Details Name: Tarian Bibler MRN: [id number] DOB: Apr 29, 1961   Cancelled Treatment:    Reason Eval/Treat Not Completed: Other (comment). Spoke with pt's RN Verdon Cummins) due to noting pt's Hbg was 5.5 yesterday and pt refusing to let vascular surgery look at wound this AM. Per RN pt just finished 2 units of blood around 7:00 am. RN feels it would be better to try and see pt this afternoon.  Evette Georges [phone] 03/01/2016, 9:01 AM

## 2016-03-01 NOTE — Progress Notes (Signed)
ANTICOAGULATION CONSULT NOTE - Follow Up Consult  Pharmacy Consult for Heparin  Indication:  LV thrombus and R femoral embolism s/p embolectomy  No Known Allergies  Patient Measurements: Height: 5\' 8"  (172.7 cm) Weight: 227 lb 15.3 oz (103.4 kg) IBW/kg (Calculated) : 68.4  Heparin dosing wt: 87 kg  Vital Signs: Temp: 98.6 F (37 C) (07/03 2327) Temp Source: Oral (07/03 2327) BP: 119/76 mmHg (07/03 2327) Pulse Rate: 99 (07/03 2327)  Labs:  Recent Labs  02/27/16 0330 02/27/16 1158 02/28/16 0259 02/28/16 1356 02/29/16 0509 02/29/16 2349  HGB 9.0*  --  7.9* 7.8* 7.7* 5.5*  HCT 25.9*  --  23.3* 23.0* 22.9* 16.9*  PLT 198  --  209  --  239  --   HEPARINUNFRC 0.49 0.60 0.58  --  0.52  --   CREATININE 1.01  --  0.85  --  0.85  --     Estimated Creatinine Clearance: 113.1 mL/min (by C-G formula based on Cr of 0.85).  Medication Infusion: . heparin Stopped (03/01/16 0041)   Assessment: 55 yo male with LV thrombus and R femoral embolism s/p embolectomy on 6/25 and now s/p hematoma evacuation. Pt s/p closure of lateral fasciotomy and VAC placement to medial fasciotomy.   RN called and pt is bleeding from wound and Hgb down to 5.5. He spoke with vascular surgeon about an hour ago who wanted to hold heparin x 1 hour then restart at reduced rate with decrease goal 0.3-0.5. Heparin has been held from [id number]. MD aware of low Hgb and bleeding and will follow closely but still wants to restart heparin. PRBC also ordered.  Goal of Therapy:  Heparin level 0.3-0.5 units/ml (lower goal with bleeding)  Monitor platelets by anticoagulation protocol: Yes   Plan:  -Restart IV heparin at 900 units/hr -Will f/u 6 hr heparin level post restart -F/u a.m. CBC - if Hgb remains <6, will need to consider stopping heparin -F/u bleeding from wounds  Christoper Fabian, PharmD, BCPS Clinical pharmacist, pager [phone]  03/01/2016 1:48 AM

## 2016-03-01 NOTE — Progress Notes (Signed)
PROGRESS NOTE    Troy Yu  LXB:[id number] DOB: 03/11/1961 DOA: 02/21/2016 PCP: No primary care provider on file.   Brief Narrative:  Troy Yu is an 55 y.o. male who was in his usual state of health until about 2 weeks ago when he had substernal chest pain that lasted about 48 hours.The pain went away after he vomited, and he attributed his symptoms to a prolonged religious fast. He then come to the [facility] ED on 02/21/16 with severe RLE pain associated with paresthesias. Upon presentation, he was found to have an abnormal EKG and elevated troponin. Cardiology was subsequently called to evaluate the patient, and he was felt to have suffered a subacute anterior MI. STAT echo was ordered and EDP advised to order an arterial duplex with recommendation to consult vascular surgery. IV heparin ordered and the patient was transferred to [facility] for further evaluation by vascular surgery, as it was felt that he had a right femoral embolism with concern for threatened limb. Dr Imogene Burn subsequently performed a right femoral embolectomy and right calf compartment fasciotomies x 4 . At 4:16 a.m. On 02/22/16, the patient complained of loss of sensation to RLE, and the patient reports he was in severe pain. He was given pain medication which did not alleviate his symptoms. He was re-evaluated by Dr. Imogene Burn at 7:33 a.m. Who removed the ACE wrap from his RLE which relieved the pain, and the patient was felt to have excessive bleeding in his calf related to full anti-coagulation. He was taken back to surgery for evacuation of hematoma and VAC dressings. Cardiologist continues to follow for management of his subacute MI. Patient underwent closure of lateral fasciotomy. Continues to have wound vac on medial aspect. Plastic surgery consulted.   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  -Case taken over by Dr. Arbie Cookey after patient requested a second opinion.  -Lipids well controlled, cholesterol 116, LDL 60.  -s/p closure of lateral fasciotomy site and placement of VAC on medial fasciotomy site -Plastic surgery consulted by vascular for possible STSG to medial fasciotomy site -Wound vac removed (medial) and wet-to-dry dressings  Symptomatic anemia/ Postoperative anemia/ Anemia secondary to blood loss -During hospitalization, Hemglobin dropped to 4.7.  Patient became hypotensive, lethargic -Currently on heparin -Has received a total of 7u PRBCs this admission -Hemoglobin 5.5 last night, pending H/H today -Continue to monitor CBC  Fever -Resolved, Continue tylenol PRN -Continue to monitor closely  Syncope and orthostasis -Likely secondary to blood loss -Continue to monitor closely  Anterior subendocardial MI (HCC)/Abnormal EKG/Elevated troponin -On therapeutic dose heparin.  -Cardiology consulted and appreciated -Continue Inderal -ACE-I to be started when BP able to tolerate -Will eventually need cardiac cath  Newly diagnosed diabetes mellitus/Hyperglycemia -Hemoglobin A1c 9.2.  -Continue lantus, ISS and CBG monitoring.  -Diabetes coordinator consulted  Hyponatremia -Possibly from hyperglycemia. Correct blood glucose and monitor. -Continue to monitor BMP  Constipation -continue colace. Patient declined suppository or enema at this time. -Fleet and dulcolax supp PRN  DVT Prophylaxis  heparin  Code Status: Full  Family Communication: Son at bedside  Disposition Plan: Admitted. Pending Plastic surg consult, further recommendations from vascular surgery. Continue to monitor hemoglobin.  Consultants Cardiology  Vascular Surgery Plastic surgery  Procedures  Right femoral embolectomy Right calf four compartment fasciotomies  Evacuation of hematoma from bilateral right calf fasciotomies Placement of negative pressure dressings x2 Closure of lateral fasciotomy site and placement of VAC on medial fasciotomy site  Antibiotics   Anti-infectives    Start     Dose/Rate Route Frequency Ordered Stop   02/26/16 0930  [MAR Hold]  cefUROXime (ZINACEF) 1.5 g in dextrose 5 % 50 mL IVPB     (MAR Hold since 02/26/16 0928)   1.5 g 100 mL/hr over 30 Minutes Intravenous To ShortStay Surgical 02/25/16 1055 02/26/16 1120   02/22/16 0800  cefUROXime (ZINACEF) 1.5 g in dextrose 5 % 50 mL IVPB     1.5 g 100 mL/hr over 30 Minutes Intravenous Every 12 hours 02/21/16 2115 02/22/16 2140   02/22/16 0600  cefUROXime (ZINACEF) injection 1.5 g     1.5 g Intramuscular On call to O.R. 02/21/16 1523 02/21/16 1713      Subjective:   Chrisean Femia seen and examined today. Patient continues to complain of leg pain especially with wound care.  Denies chest pain, shortness of breath, abdominal pain, abdominal pain, dizziness, headache.   Objective:   Filed Vitals:   03/01/16 0537 03/01/16 0655 03/01/16 0700 03/01/16 0847  BP:   113/67 101/65  Pulse: 88 87 87 87  Temp:   98.4 F (36.9 C) 98.3 F (36.8 C)  TempSrc:    Oral  Resp: 14 17 14 13   Height:      Weight:      SpO2: 99% 100% 98% 99%    Intake/Output Summary (Last 24 hours) at 03/01/16 1121 Last data filed at 03/01/16 0655  Gross per 24 hour  Intake 1668.17 ml  Output   2625 ml  Net -956.83 ml   Filed Weights   02/27/16 0325 02/28/16 0255 02/29/16 0303  Weight: 104.6 kg (230 lb 9.6 oz) 104.9 kg (231 lb 4.2 oz) 103.4 kg (227 lb 15.3 oz)    Exam  General: Well developed, well nourished, no apparent distress  HEENT: NCAT, mucous membranes moist.   Cardiovascular: S1 S2 auscultated, no murmurs, RRR  Respiratory: Clear to auscultation bilaterally with equal chest rise.  Abdomen: Soft,  nontender, nondistended, + bowel sounds  Extremities: warm dry without cyanosis clubbing or edema. RLE wrapped  Neuro: AAOx3, slightly confused on the day, nonfocal  Psych: Appropriate mood and affect   Data Reviewed: I have personally reviewed following labs and imaging studies  CBC:  Recent Labs Lab 02/26/16 0103 02/26/16 0812  02/27/16 0330 02/28/16 0259 02/28/16 1356 02/29/16 0509 02/29/16 2349  WBC 12.5* 12.9*  --  16.5* 11.8*  --  9.7  --   NEUTROABS  --  8.5*  --   --   --   --   --   --   HGB 4.7* 6.6*  6.3*  < > 9.0* 7.9* 7.8* 7.7* 5.5*  HCT 14.0* 19.6*  18.7*  < > 25.9* 23.3* 23.0* 22.9* 16.9*  MCV 87.0 88.2  --  82.7 86.6  --  89.1  --   PLT 304 223  --  198 209  --  239  --   < > = values in this interval not displayed. Basic Metabolic Panel:  Recent Labs Lab 02/26/16 0812 02/26/16 1211 02/27/16 0330 02/28/16 0259 02/29/16 0509 03/01/16 0904  NA 132* 131* 128* 128* 130* 126*  K 4.2 4.3 4.1 3.9 3.9 4.5  CL 104  --  100* 98* 100* 96*  CO2  22  --  23 24 24 24   GLUCOSE 204*  --  212* 162* 250* 194*  BUN 12  --  15 10 8 11   CREATININE 0.89  --  1.01 0.85 0.85 0.90  CALCIUM 7.0*  --  7.0* 7.3* 7.6* 7.3*   GFR: Estimated Creatinine Clearance: 106.8 mL/min (by C-G formula based on Cr of 0.9). Liver Function Tests: No results for input(s): AST, ALT, ALKPHOS, BILITOT, PROT, ALBUMIN in the last 168 hours. No results for input(s): LIPASE, AMYLASE in the last 168 hours. No results for input(s): AMMONIA in the last 168 hours. Coagulation Profile: No results for input(s): INR, PROTIME in the last 168 hours. Cardiac Enzymes: No results for input(s): CKTOTAL, CKMB, CKMBINDEX, TROPONINI in the last 168 hours. BNP (last 3 results) No results for input(s): PROBNP in the last 8760 hours. HbA1C: No results for input(s): HGBA1C in the last 72 hours. CBG:  Recent Labs Lab 02/29/16 0750 02/29/16 1236 02/29/16 1700 02/29/16 2113 03/01/16 0845  GLUCAP 220*  151* 186* 163* 186*   Lipid Profile: No results for input(s): CHOL, HDL, LDLCALC, TRIG, CHOLHDL, LDLDIRECT in the last 72 hours. Thyroid Function Tests: No results for input(s): TSH, T4TOTAL, FREET4, T3FREE, THYROIDAB in the last 72 hours. Anemia Panel: No results for input(s): VITAMINB12, FOLATE, FERRITIN, TIBC, IRON, RETICCTPCT in the last 72 hours. Urine analysis:    Component Value Date/Time   COLORURINE YELLOW 02/26/2016 0229   APPEARANCEUR CLEAR 02/26/2016 0229   LABSPEC 1.014 02/26/2016 0229   PHURINE 6.0 02/26/2016 0229   GLUCOSEU 500* 02/26/2016 0229   HGBUR NEGATIVE 02/26/2016 0229   BILIRUBINUR NEGATIVE 02/26/2016 0229   KETONESUR NEGATIVE 02/26/2016 0229   PROTEINUR NEGATIVE 02/26/2016 0229   NITRITE NEGATIVE 02/26/2016 0229   LEUKOCYTESUR NEGATIVE 02/26/2016 0229   Sepsis Labs: @LABRCNTIP (procalcitonin:4,lacticidven:4)  ) Recent Results (from the past 240 hour(s))  MRSA PCR Screening     Status: None   Collection Time: 02/21/16 10:17 PM  Result Value Ref Range Status   MRSA by PCR NEGATIVE NEGATIVE Final    Comment:        The GeneXpert MRSA Assay (FDA approved for NASAL specimens only), is one component of a comprehensive MRSA colonization surveillance program. It is not intended to diagnose MRSA infection nor to guide or monitor treatment for MRSA infections.   Culture, blood (routine x 2)     Status: None (Preliminary result)   Collection Time: 02/26/16  8:12 AM  Result Value Ref Range Status   Specimen Description BLOOD BLOOD LEFT HAND  Final   Special Requests IN PEDIATRIC BOTTLE 2.5CC  Final   Culture NO GROWTH 3 DAYS  Final   Report Status PENDING  Incomplete  Culture, blood (routine x 2)     Status: None (Preliminary result)   Collection Time: 02/26/16  8:26 AM  Result Value Ref Range Status   Specimen Description BLOOD LEFT ANTECUBITAL  Final   Special Requests IN PEDIATRIC BOTTLE 1CC  Final   Culture NO GROWTH 3 DAYS  Final   Report  Status PENDING  Incomplete      Radiology Studies: No results found.   Scheduled Meds: . aspirin  325 mg Oral Daily  . atorvastatin  80 mg Oral q1800  . docusate sodium  100 mg Oral Daily  . insulin aspart  0-15 Units Subcutaneous TID WC  . insulin aspart  0-5 Units Subcutaneous QHS  . insulin aspart  6 Units Subcutaneous TID WC  . insulin glargine  20 Units Subcutaneous QHS  . pantoprazole  40 mg Oral Daily  . propranolol  10 mg Oral TID   Continuous Infusions: . heparin 900 Units/hr (03/01/16 0150)     LOS: 9 days   Time Spent in minutes   30 minutes  Cobain Morici D.O. on 03/01/2016 at 11:21 AM  Between 7am to 7pm - Pager - [phone]  After 7pm go to [email or web address] - password [email or web address]  And look for the night coverage person covering for me after hours  [facility] Office  [phone]

## 2016-03-01 NOTE — Progress Notes (Signed)
ANTICOAGULATION CONSULT NOTE - Follow Up Consult  Pharmacy Consult for Heparin  Indication:  LV thrombus and R femoral embolism s/p embolectomy  No Known Allergies  Patient Measurements: Height: 5\' 8"  (172.7 cm) Weight: 227 lb 15.3 oz (103.4 kg) IBW/kg (Calculated) : 68.4  Heparin dosing wt: 87 kg  Vital Signs: Temp: 100 F (37.8 C) (07/04 1258) Temp Source: Oral (07/04 1258) BP: 107/69 mmHg (07/04 1258) Pulse Rate: 81 (07/04 1258)  Labs:  Recent Labs  02/28/16 0259  02/29/16 0509 02/29/16 2349 03/01/16 0904 03/01/16 1112 03/01/16 1451  HGB 7.9*  < > 7.7* 5.5*  --  8.7*  --   HCT 23.3*  < > 22.9* 16.9*  --  25.0*  --   PLT 209  --  239  --   --   --   --   HEPARINUNFRC 0.58  --  0.52  --  0.34  --  0.32  CREATININE 0.85  --  0.85  --  0.90  --   --   < > = values in this interval not displayed.  Estimated Creatinine Clearance: 106.8 mL/min (by C-G formula based on Cr of 0.9).  Medication Infusion: . heparin 900 Units/hr (03/01/16 1400)   Assessment: 55 yo male with LV thrombus and R femoral embolism s/p embolectomy on 6/25 and now s/p hematoma evacuation. Pt s/p closure of lateral fasciotomy and VAC placement to medial fasciotomy.   PM Heparin level = 032   Goal of Therapy:  Heparin level 0.3-0.5 units/ml (lower goal with bleeding)  Monitor platelets by anticoagulation protocol: Yes   Plan:  -Continue IV heparin at 900 units/hr -Follow up AM labs  Thank you Okey Regal, PharmD [phone] 03/01/2016 4:20 PM

## 2016-03-01 NOTE — Progress Notes (Signed)
Wound inspected.  Muscle viable.  Some clot over the muscle.  One spot of active skin edge oozing controlled with direct pressure.  Dressing reapplied.  Fabienne Bruns, MD [facility] Office: [phone] Pager: [phone]

## 2016-03-01 NOTE — Progress Notes (Signed)
OT Cancellation Note  Patient Details Name: Troy Yu MRN: [id number] DOB: [birth year]   Cancelled Treatment:    Reason Eval/Treat Not Completed: Patient at procedure or test/ unavailable. Pt currently getting RLE wound redressed.   Evette Georges [phone] 03/01/2016, 1:29 PM

## 2016-03-02 LAB — BASIC METABOLIC PANEL
Anion gap: 7 (ref 5–15)
BUN: 9 mg/dL (ref 6–20)
CALCIUM: 7.3 mg/dL — AB (ref 8.9–10.3)
CHLORIDE: 97 mmol/L — AB (ref 101–111)
CO2: 25 mmol/L (ref 22–32)
CREATININE: 0.78 mg/dL (ref 0.61–1.24)
Glucose, Bld: 212 mg/dL — ABNORMAL HIGH (ref 65–99)
POTASSIUM: 3.8 mmol/L (ref 3.5–5.1)
SODIUM: 129 mmol/L — AB (ref 135–145)

## 2016-03-02 LAB — CULTURE, BLOOD (ROUTINE X 2)
Culture: NO GROWTH
Culture: NO GROWTH

## 2016-03-02 LAB — GLUCOSE, CAPILLARY
GLUCOSE-CAPILLARY: 174 mg/dL — AB (ref 65–99)
GLUCOSE-CAPILLARY: 203 mg/dL — AB (ref 65–99)
Glucose-Capillary: 111 mg/dL — ABNORMAL HIGH (ref 65–99)
Glucose-Capillary: 219 mg/dL — ABNORMAL HIGH (ref 65–99)

## 2016-03-02 LAB — HEPARIN LEVEL (UNFRACTIONATED)
HEPARIN UNFRACTIONATED: 0.24 [IU]/mL — AB (ref 0.30–0.70)
Heparin Unfractionated: 0.44 IU/mL (ref 0.30–0.70)

## 2016-03-02 LAB — CBC
HEMATOCRIT: 20.3 % — AB (ref 39.0–52.0)
HEMOGLOBIN: 7 g/dL — AB (ref 13.0–17.0)
MCH: 31.4 pg (ref 26.0–34.0)
MCHC: 34.5 g/dL (ref 30.0–36.0)
MCV: 91 fL (ref 78.0–100.0)
Platelets: 212 10*3/uL (ref 150–400)
RBC: 2.23 MIL/uL — ABNORMAL LOW (ref 4.22–5.81)
RDW: 16.3 % — AB (ref 11.5–15.5)
WBC: 11.2 10*3/uL — AB (ref 4.0–10.5)

## 2016-03-02 LAB — HEMOGLOBIN AND HEMATOCRIT, BLOOD
HCT: 22.7 % — ABNORMAL LOW (ref 39.0–52.0)
HEMOGLOBIN: 7.7 g/dL — AB (ref 13.0–17.0)

## 2016-03-02 MED ORDER — HYDROMORPHONE HCL 1 MG/ML IJ SOLN
1.0000 mg | INTRAMUSCULAR | Status: DC | PRN
  Administered 2016-03-02 – 2016-03-03 (×4): 1 mg via INTRAVENOUS
  Filled 2016-03-02 (×5): qty 1

## 2016-03-02 NOTE — Progress Notes (Signed)
PROGRESS NOTE    Troy Yu  EGB:[id number] DOB: 1961-07-23 DOA: 02/21/2016 PCP: No primary care provider on file.   Brief Narrative:  Troy Yu is an 55 y.o. male who was in his usual state of health until about 2 weeks ago when he had substernal chest pain that lasted about 48 hours.The pain went away after he vomited, and he attributed his symptoms to a prolonged religious fast. He then come to the [facility] ED on 02/21/16 with severe RLE pain associated with paresthesias. Upon presentation, he was found to have an abnormal EKG and elevated troponin. Cardiology was subsequently called to evaluate the patient, and he was felt to have suffered a subacute anterior MI. STAT echo was ordered and EDP advised to order an arterial duplex with recommendation to consult vascular surgery. IV heparin ordered and the patient was transferred to [facility] for further evaluation by vascular surgery, as it was felt that he had a right femoral embolism with concern for threatened limb. Dr Imogene Burn subsequently performed a right femoral embolectomy and right calf compartment fasciotomies x 4 . At 4:16 a.m. On 02/22/16, the patient complained of loss of sensation to RLE, and the patient reports he was in severe pain. He was given pain medication which did not alleviate his symptoms. He was re-evaluated by Dr. Imogene Burn at 7:33 a.m. Who removed the ACE wrap from his RLE which relieved the pain, and the patient was felt to have excessive bleeding in his calf related to full anti-coagulation. He was taken back to surgery for evacuation of hematoma and VAC dressings. Cardiologist continues to follow for management of his subacute MI. Patient underwent closure of lateral fasciotomy. Continues to have wound vac on medial aspect. Plastic surgery consulted and recommendations given..   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  -Case taken over by Dr. Arbie Cookey after patient requested a second opinion.  -Lipids well controlled, cholesterol 116, LDL 60.  -s/p closure of lateral fasciotomy site and placement of VAC on medial fasciotomy site -Plastic surgery consulted by vascular for possible STSG to medial fasciotomy site -Wound vac removed (medial) and wet-to-dry dressings - today the wound continues to ooze and dressing changed twice already.  Symptomatic anemia/ Postoperative anemia/ Anemia secondary to blood loss -During hospitalization, Hemglobin dropped to 4.7.  Patient became hypotensive, lethargic -Currently on heparin -Has received a total of 7u PRBCs this admission -Hemoglobin 7.7 this am and stable.  -Continue to monitor CBC intermittently.  - anemia from blood loss from continuous oozing.  - transfuse to keep hemoglobin greater than 8.   Fever -Resolved, Continue tylenol PRN -Continue to monitor closely  Syncope and orthostasis -Likely secondary to blood loss -Continue to monitor closely  Anterior subendocardial MI (HCC)/Abnormal EKG/Elevated troponin -On therapeutic dose heparin.  -Cardiology consulted and appreciated -Continue Inderal -ACE-I to be started when BP able to tolerate -Will eventually need cardiac cath  Newly diagnosed diabetes mellitus/Hyperglycemia -Hemoglobin A1c 9.2.  -Continue lantus, ISS and CBG monitoring.  -Diabetes coordinator consulted - CBG (last 3)   Recent Labs  03/01/16 2135 03/02/16 0717 03/02/16 1201  GLUCAP 183* 174* 203*      Hyponatremia Get serum osmolarity, urine osmo, TSH and am cortisol.  -Continue to monitor  BMP  Constipation -continue colace. Improved.  -Fleet and dulcolax supp PRN  DVT Prophylaxis  heparin  Code Status:  Full  Family Communication: Son at bedside  Disposition Plan: Admitted. Pending  further recommendations from vascular surgery. Continue to monitor hemoglobin.  Consultants Cardiology  Vascular Surgery Plastic surgery  Procedures  Right femoral embolectomy Right calf four compartment fasciotomies  Evacuation of hematoma from bilateral right calf fasciotomies Placement of negative pressure dressings x2 Closure of lateral fasciotomy site and placement of VAC on medial fasciotomy site  Antibiotics   Anti-infectives    Start     Dose/Rate Route Frequency Ordered Stop   02/26/16 0930  [MAR Hold]  cefUROXime (ZINACEF) 1.5 g in dextrose 5 % 50 mL IVPB     (MAR Hold since 02/26/16 0928)   1.5 g 100 mL/hr over 30 Minutes Intravenous To ShortStay Surgical 02/25/16 1055 02/26/16 1120   02/22/16 0800  cefUROXime (ZINACEF) 1.5 g in dextrose 5 % 50 mL IVPB     1.5 g 100 mL/hr over 30 Minutes Intravenous Every 12 hours 02/21/16 2115 02/22/16 2140   02/22/16 0600  cefUROXime (ZINACEF) injection 1.5 g     1.5 g Intramuscular On call to O.R. 02/21/16 1523 02/21/16 1713      Subjective:   Troy Yu seen and examined today. He was very frustrated with the way his dressing was changed.  He reports pain is not well controlled. In creased his IV pain meds.   Objective:   Filed Vitals:   03/02/16 0530 03/02/16 0643 03/02/16 0720 03/02/16 1202  BP:   96/63 91/58  Pulse: 81 88 86 81  Temp:   97.9 F (36.6 C) 98.2 F (36.8 C)  TempSrc:   Oral Oral  Resp: Height:      Weight:      SpO2: 100% 100% 100% 100%    Intake/Output Summary (Last 24 hours) at 03/02/16 1428 Last data filed at 03/02/16 1300  Gross per 24 hour  Intake 442.08 ml  Output   4200 ml  Net -3757.92 ml   Filed Weights   02/27/16 0325 02/28/16 0255 02/29/16 0303  Weight: 104.6 kg (230 lb 9.6 oz) 104.9 kg (231 lb 4.2 oz) 103.4 kg (227 lb 15.3 oz)    Exam  General: Well developed, well nourished,  no apparent distress  HEENT: NCAT, mucous membranes moist.   Cardiovascular: S1 S2 auscultated, no murmurs, RRR  Respiratory: Clear to auscultation bilaterally with equal chest rise.  Abdomen: Soft, nontender, nondistended, + bowel sounds  Extremities: warm dry without cyanosis clubbing or edema. RLE wrapped  Neuro: AAOx3, slightly confused on the day, nonfocal  Psych: Appropriate mood and affect   Data Reviewed: I have personally reviewed following labs and imaging studies  CBC:  Recent Labs Lab 02/26/16 0812  02/27/16 0330 02/28/16 0259  02/29/16 0509 02/29/16 2349 03/01/16 1112 03/02/16 0229 03/02/16 0817  WBC 12.9*  --  16.5* 11.8*  --  9.7  --   --  11.2*  --   NEUTROABS 8.5*  --   --   --   --   --   --   --   --   --   HGB 6.6*  6.3*  < > 9.0* 7.9*  < > 7.7* 5.5* 8.7* 7.0* 7.7*  HCT 19.6*  18.7*  < > 25.9* 23.3*  < > 22.9* 16.9* 25.0* 20.3* 22.7*  MCV 88.2  --  82.7 86.6  --  89.1  --   --  91.0  --   PLT 223  --  198 209  --  239  --   --  212  --   < > = values in this interval not displayed. Basic Metabolic Panel:  Recent Labs Lab 02/27/16 0330 02/28/16 0259 02/29/16 0509 03/01/16 0904 03/02/16 0229  NA 128* 128* 130* 126* 129*  K 4.1 3.9 3.9 4.5 3.8  CL 100* 98* 100* 96* 97*  CO2 23 24 24 24 25   GLUCOSE 212* 162* 250* 194* 212*  BUN 15 10 8 11 9   CREATININE 1.01 0.85 0.85 0.90 0.78  CALCIUM 7.0* 7.3* 7.6* 7.3* 7.3*   GFR: Estimated Creatinine Clearance: 120.2 mL/min (by C-G formula based on Cr of 0.78). Liver Function Tests: No results for input(s): AST, ALT, ALKPHOS, BILITOT, PROT, ALBUMIN in the last 168 hours. No results for input(s): LIPASE, AMYLASE in the last 168 hours. No results for input(s): AMMONIA in the last 168 hours. Coagulation Profile: No results for input(s): INR, PROTIME in the last 168 hours. Cardiac Enzymes: No results for input(s): CKTOTAL, CKMB, CKMBINDEX, TROPONINI in the last 168 hours. BNP (last 3 results) No  results for input(s): PROBNP in the last 8760 hours. HbA1C: No results for input(s): HGBA1C in the last 72 hours. CBG:  Recent Labs Lab 03/01/16 1257 03/01/16 1621 03/01/16 2135 03/02/16 0717 03/02/16 1201  GLUCAP 164* 151* 183* 174* 203*   Lipid Profile: No results for input(s): CHOL, HDL, LDLCALC, TRIG, CHOLHDL, LDLDIRECT in the last 72 hours. Thyroid Function Tests: No results for input(s): TSH, T4TOTAL, FREET4, T3FREE, THYROIDAB in the last 72 hours. Anemia Panel: No results for input(s): VITAMINB12, FOLATE, FERRITIN, TIBC, IRON, RETICCTPCT in the last 72 hours. Urine analysis:    Component Value Date/Time   COLORURINE YELLOW 02/26/2016 0229   APPEARANCEUR CLEAR 02/26/2016 0229   LABSPEC 1.014 02/26/2016 0229   PHURINE 6.0 02/26/2016 0229   GLUCOSEU 500* 02/26/2016 0229   HGBUR NEGATIVE 02/26/2016 0229   BILIRUBINUR NEGATIVE 02/26/2016 0229   KETONESUR NEGATIVE 02/26/2016 0229   PROTEINUR NEGATIVE 02/26/2016 0229   NITRITE NEGATIVE 02/26/2016 0229   LEUKOCYTESUR NEGATIVE 02/26/2016 0229   Sepsis Labs: @LABRCNTIP (procalcitonin:4,lacticidven:4)  ) Recent Results (from the past 240 hour(s))  MRSA PCR Screening     Status: None   Collection Time: 02/21/16 10:17 PM  Result Value Ref Range Status   MRSA by PCR NEGATIVE NEGATIVE Final    Comment:        The GeneXpert MRSA Assay (FDA approved for NASAL specimens only), is one component of a comprehensive MRSA colonization surveillance program. It is not intended to diagnose MRSA infection nor to guide or monitor treatment for MRSA infections.   Culture, blood (routine x 2)     Status: None (Preliminary result)   Collection Time: 02/26/16  8:12 AM  Result Value Ref Range Status   Specimen Description BLOOD BLOOD LEFT HAND  Final   Special Requests IN PEDIATRIC BOTTLE 2.5CC  Final   Culture NO GROWTH 4 DAYS  Final   Report Status PENDING  Incomplete  Culture, blood (routine x 2)     Status: None (Preliminary  result)   Collection Time: 02/26/16  8:26 AM  Result Value Ref Range Status   Specimen Description BLOOD LEFT ANTECUBITAL  Final   Special Requests IN PEDIATRIC BOTTLE 1CC  Final   Culture NO GROWTH 4 DAYS  Final   Report Status PENDING  Incomplete  Radiology Studies: No results found.   Scheduled Meds: . aspirin  325 mg Oral Daily  . atorvastatin  80 mg Oral q1800  . docusate sodium  100 mg Oral Daily  . insulin aspart  0-15 Units Subcutaneous TID WC  . insulin aspart  0-5 Units Subcutaneous QHS  . insulin aspart  6 Units Subcutaneous TID WC  . insulin glargine  20 Units Subcutaneous QHS  . pantoprazole  40 mg Oral Daily  . propranolol  10 mg Oral TID   Continuous Infusions: . heparin 950 Units/hr (03/02/16 0354)     LOS: 10 days   Time Spent in minutes   30 minutes  Troy Yu D.O. on 03/02/2016 at 2:28 PM  [phone]  After 7pm go to [email or web address] - password [email or web address]  And look for the night coverage person covering for me after hours  [facility] Office  [phone]

## 2016-03-02 NOTE — Progress Notes (Addendum)
[facility]  Subjective  - Still sever pain issues with dressing changes at bedside.   Objective 96/63 86 97.9 F (36.6 C) (Oral) 12 100%  Intake/Output Summary (Last 24 hours) at 03/02/16 1110 Last data filed at 03/02/16 0600  Gross per 24 hour  Intake 1157.58 ml  Output   4625 ml  Net -3467.42 ml    Right lateral incision healing well Medial open fasciotomy healthy muscle tissue  Wet to dry with hydro gel applied Sensation of right foot grossly intact and active range of motion minimal secondary to pain  Assessment/Planning:  Embolectomy and fasciotomy right LE Muscle is viable  Dr. Arbie Cookey discussed at bedside the options for healing contnue wet to dry hydro gel verses skin grafting. For now we will continue the wet to dry hydrogel daily.  Clinton Gallant [facility] 03/02/2016 11:10 AM --  Laboratory Lab Results:  Recent Labs  02/29/16 0509  03/02/16 0229 03/02/16 0817  WBC 9.7  --  11.2*  --   HGB 7.7*  < > 7.0* 7.7*  HCT 22.9*  < > 20.3* 22.7*  PLT 239  --  212  --   < > = values in this interval not displayed. BMET  Recent Labs  03/01/16 0904 03/02/16 0229  NA 126* 129*  K 4.5 3.8  CL 96* 97*  CO2 24 25  GLUCOSE 194* 212*  BUN 11 9  CREATININE 0.90 0.78  CALCIUM 7.3* 7.3*    COAG Lab Results  Component Value Date   INR 1.11 02/21/2016   No results found for: PTT    I have examined the patient, reviewed and agree with above.Continues to have the pain with dressing changes. Removed his dressing today. Was quite concerned regarding the temperature of the saline to soak the gauze. Were able to remove the dressing. The lateral aspect is completely closed. This does appear to be healing well. The medial aspect is wide the open with the good viable muscle below this. Continues to have 3+ dorsalis pedis pulse. I discussed options with the patient. I explained that he could have potentially weeks to even months of  dressing changes to get this large defect to close. This would be done with home health daily dressing changes. Explain the option of plastic surgery evaluation that this may be made much more quickly with potential skin graft. Explained that this would not be in my area of expertise. He does not want to consider further surgery and was to continue addressing changes. I explained that this would be a very protracted course and that would be done at home with sometimes a varying degree of foreign experiences with home health care givers. He has very high expectations regarding the timeliness of care provided to him. For now the plan is continue the local wound care as we are doing. From a cardiac standpoint he can be converted to oral anticoagulation a felt appropriate at any time. Saturation for catheterization still ongoing.  Gretta Began, MD 03/02/2016 12:07 PM

## 2016-03-02 NOTE — Progress Notes (Addendum)
ANTICOAGULATION CONSULT NOTE - Follow Up Consult  Pharmacy Consult for heparin Indication: LV thrombus and R femoral embolism s/p embolectomy  Labs:  Recent Labs  02/29/16 0509 02/29/16 2349 03/01/16 0904 03/01/16 1112 03/01/16 1451 03/02/16 0229  HGB 7.7* 5.5*  --  8.7*  --  7.0*  HCT 22.9* 16.9*  --  25.0*  --  20.3*  PLT 239  --   --   --   --  212  HEPARINUNFRC 0.52  --  0.34  --  0.32 0.24*  CREATININE 0.85  --  0.90  --   --   --     Assessment: 55yo male now subtherapeutic on heparin after several levels at goal though had been at low end of goal; pt had been bleeding but now seems to be controlled per RN w/ compression dressing and no saturation to dressings.  Goal of Therapy:  Heparin level 0.3-0.5 units/ml  Plan:  Will increase heparin gtt slightly to 950 units/hr and check level in 6hr.  Vernard Gambles, PharmD, BCPS  03/02/2016,3:22 AM

## 2016-03-02 NOTE — Progress Notes (Signed)
No new cardiac issues. Will wait for more stability with wound healing before we pursue cardiac cath. We will see later this week. Please call us if there are any changes.   Verne Carrow 03/02/2016 7:27 AM

## 2016-03-02 NOTE — Progress Notes (Signed)
Patient experiencing excessive bleeding from fasciotomy site despite application of layers of ABD pads and pressure.  Dr. Arbie Cookey notified and Heparin on hold until discussion with cardiology and an alternative plan of care can be made.

## 2016-03-02 NOTE — Progress Notes (Signed)
ANTICOAGULATION CONSULT NOTE - Follow Up Consult  Pharmacy Consult for Heparin  Indication:  LV thrombus and R femoral embolism s/p embolectomy  No Known Allergies  Patient Measurements: Height: 5\' 8"  (172.7 cm) Weight: 227 lb 15.3 oz (103.4 kg) IBW/kg (Calculated) : 68.4  Heparin dosing wt: 87 kg   Labs:  Recent Labs  02/29/16 0509  03/01/16 0904 03/01/16 1112 03/01/16 1451 03/02/16 0229 03/02/16 0817  HGB 7.7*  < >  --  8.7*  --  7.0* 7.7*  HCT 22.9*  < >  --  25.0*  --  20.3* 22.7*  PLT 239  --   --   --   --  212  --   HEPARINUNFRC 0.52  --  0.34  --  0.32 0.24* 0.44  CREATININE 0.85  --  0.90  --   --  0.78  --   < > = values in this interval not displayed.  Estimated Creatinine Clearance: 120.2 mL/min (by C-G formula based on Cr of 0.78).  Medication Infusion: . heparin 950 Units/hr (03/02/16 0354)   Assessment: 55 yo male with LV thrombus and R femoral embolism s/p embolectomy on 6/25 and now s/p hematoma evacuation. Pt s/p closure of lateral fasciotomy and VAC placement to medial fasciotomy.   Patient continues on IV heparin which is now at goal on 950 units/hr after rate change this am. No bleeding events noted overnight and hgb is now trending up.  Goal of Therapy:  Heparin level 0.3-0.5 units/ml (lower goal with bleeding)  Monitor platelets by anticoagulation protocol: Yes   Plan:  -Continue IV heparin at 950 units/hr -Follow up AM labs  Thank you, Sheppard Coil PharmD., BCPS Clinical Pharmacist Pager [phone] 03/02/2016 11:43 AM

## 2016-03-02 NOTE — Progress Notes (Signed)
CRITICAL VALUE ALER Critical value received:  hgb 7  Date of notification:  03/02/2016  Time of notification:  0330  Critical value read back:yes  Nurse who received alert:  Anselm Lis  MD notified (1st page): Yes  Time of first page:  0331  MD notified (2nd page):  Time of second page:  Responding MD:  Maren Reamer  Time MD responded:  [phone]

## 2016-03-02 NOTE — Progress Notes (Signed)
Physical Therapy Treatment Patient Details Name: Troy Yu MRN: [id number] DOB: 1961/06/01 Today's Date: 03/02/2016    History of Present Illness 55 y.o. male with no significant hx of cad/pvd/angina/claudication. 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 adm with complaints of red right leg pain; + NSTEMI subacute; EF 30-35%; +left ventricle thrombus; +Rt femoral embolism, 6/25 femoral embolectomy and four compartment calf fasciotomies; 6/26 Evacuation of hematoma from bilateral right calf fasciotomies with placement of negative pressure dressings x 2 with plan for partial wound closeure with VAC replacement on 02/26/16    PT Comments    Pt willing to work with PT/OT but still significantly limited by pain (pt premedicated with IV pain meds and oral pain meds given during session). Tolerated sitting EOB but could not be convinced to attempt standing to transfer to [facility] or get bed sheets changed. PT will continue to follow.    Follow Up Recommendations  Home health PT;Supervision for mobility/OOB     Equipment Recommendations  Rolling walker with 5" wheels    Recommendations for Other Services       Precautions / Restrictions Precautions Precautions: Fall Restrictions Weight Bearing Restrictions: No    Mobility  Bed Mobility Overal bed mobility: Needs Assistance Bed Mobility: Supine to Sit;Sit to Supine     Supine to sit: Min assist Sit to supine: Min assist   General bed mobility comments: min A for legs off bed and back on for out and back into bed. HOB elevated. Pt able to scoot to [facility] with min A and use of bed rail and left LE  Transfers Overall transfer level: Needs assistance Equipment used: Rolling walker (2 wheeled) Transfers: Sit to/from Stand           General transfer comment: goal was to pivot to [facility] but once pt sitting EOB with legs dependent, pain  increased beyond point that he could tolerate and he was not agreeable to attempt standing or transferring  Ambulation/Gait                 Stairs            Wheelchair Mobility    Modified Rankin (Stroke Patients Only)       Balance Overall balance assessment: Needs assistance Sitting-balance support: Feet supported;Single extremity supported Sitting balance-Leahy Scale: Poor Sitting balance - Comments: pt required UE support to maintain sitting EOB today due to discomfort and trying to take pressure off RLE Postural control: Left lateral lean                          Cognition Arousal/Alertness: Awake/alert Behavior During Therapy: WFL for tasks assessed/performed;Anxious Overall Cognitive Status: Within Functional Limits for tasks assessed                      Exercises General Exercises - Lower Extremity Ankle Circles/Pumps: AROM;Right;10 reps Quad Sets: AROM;Right;10 reps;Supine Other Exercises Other Exercises: revisited proper positioning    General Comments General comments (skin integrity, edema, etc.): pt soaking through dressing, RN aware and reinforced after session.       Pertinent Vitals/Pain Pain Assessment: Faces Faces Pain Scale: Hurts whole lot Pain Location: R calf in sitting Pain Descriptors / Indicators: Grimacing;Guarding Pain Intervention(s): Limited activity within patient's tolerance;Monitored during session;Patient requesting pain meds-RN notified;RN gave pain meds during session;Premedicated before  session;Repositioned    Home Living                      Prior Function            PT Goals (current goals can now be found in the care plan section) Acute Rehab PT Goals Patient Stated Goal: be able to walk again PT Goal Formulation: With patient Time For Goal Achievement: 03/15/16 Potential to Achieve Goals: Fair Progress towards PT goals: Not progressing toward goals - comment (pain inhibiting)     Frequency  Min 4X/week    PT Plan Current plan remains appropriate    Co-evaluation PT/OT/SLP Co-Evaluation/Treatment: Yes Reason for Co-Treatment: For patient/therapist safety;Complexity of the patient's impairments (multi-system involvement) PT goals addressed during session: Balance;Strengthening/ROM;Mobility/safety with mobility       End of Session   Activity Tolerance: Patient limited by pain Patient left: in bed;with call bell/phone within reach;with family/visitor present     Time: 4492-0100 PT Time Calculation (min) (ACUTE ONLY): 32 min  Charges:  $Therapeutic Activity: 8-22 mins                    G Codes:     Lyanne Co, PT  Acute Rehab Services  [phone]  Lyanne Co 03/02/2016, 3:29 PM

## 2016-03-02 NOTE — Progress Notes (Signed)
Attempt made to change patient's dressing.  Patient refused, stating he wanted to have the doctor see his incision "now".  It was explained to him that the surgeon wished the dressing to be changed daily and that he had adequate pain medication on board to complete this now.  He insisted that the surgeon be notified.  He also wanted the sterile water "warmed up" prior to soaking the old dressing off.  It was advised that this not be done due to increased risk of bleeding.  Dr. Arbie Cookey was notified re:  His desire to see him.  He returned the call at 1030 and stated he would be there to change his dressing.  Patient made aware.

## 2016-03-02 NOTE — Progress Notes (Signed)
Occupational Therapy Treatment Patient Details Name: Troy Yu MRN: [id number] DOB: 1960-10-28 Today's Date: 03/02/2016    History of present illness 55 y.o. male with no significant hx of cad/pvd/angina/claudication. 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 adm with complaints of red right leg pain; + NSTEMI subacute; EF 30-35%; +left ventricle thrombus; +Rt femoral embolism, 6/25 femoral embolectomy and four compartment calf fasciotomies; 6/26 Evacuation of hematoma from bilateral right calf fasciotomies with placement of negative pressure dressings x 2 with plan for partial wound closeure with VAC replacement on 02/26/16   OT comments  With encouragement, pt was willing to work with PT/OT, but only able to move to EOB.  Rt calf pain severely limiting activity, and pt not able to stand or transfer.   Will continue to follow.   Follow Up Recommendations  Home health OT;Supervision/Assistance - 24 hour - if pt unable to progress, may need to revisit disposition   Equipment Recommendations  3 in 1 bedside comode    Recommendations for Other Services      Precautions / Restrictions Precautions Precautions: Fall Restrictions Weight Bearing Restrictions: No       Mobility Bed Mobility Overal bed mobility: Needs Assistance Bed Mobility: Supine to Sit;Sit to Supine     Supine to sit: Min assist Sit to supine: Min assist   General bed mobility comments: min A for legs off bed and back on for out and back into bed. HOB elevated. Pt able to scoot to [facility] with min A and use of bed rail and left LE  Transfers Overall transfer level: Needs assistance Equipment used: Rolling walker (2 wheeled) Transfers: Sit to/from Stand           General transfer comment: goal was to pivot to [facility] but once pt sitting EOB with legs dependent, pain increased beyond point that he could tolerate  and he was not agreeable to attempt standing or transferring    Balance Overall balance assessment: Needs assistance Sitting-balance support: Feet supported Sitting balance-Leahy Scale: Poor Sitting balance - Comments: pt required UE support to maintain sitting EOB today due to discomfort and trying to take pressure off RLE Postural control: Left lateral lean                         ADL Overall ADL's : Needs assistance/impaired                           Toilet Transfer Details (indicate cue type and reason): Moved to EOB in preparation for ambulation to toilet, but pain too severe Rt LE.  Encouraged transfer to [facility], but pt states he is unable due to pain                   Vision                     Perception     Praxis      Cognition   Behavior During Therapy: [facility] for tasks assessed/performed;Anxious Overall Cognitive Status: Within Functional Limits for tasks assessed                       Extremity/Trunk Assessment               Exercises General Exercises - Lower  Extremity Ankle Circles/Pumps: AROM;Right;10 reps Quad Sets: AROM;Right;10 reps;Supine Other Exercises Other Exercises: revisited proper positioning   Shoulder Instructions       General Comments      Pertinent Vitals/ Pain       Pain Assessment: Faces Faces Pain Scale: Hurts whole lot Pain Location: Rt calf  Pain Descriptors / Indicators: Grimacing;Guarding;Sharp Pain Intervention(s): Limited activity within patient's tolerance;Repositioned;RN gave pain meds during session  Home Living                                          Prior Functioning/Environment              Frequency Min 2X/week     Progress Toward Goals  OT Goals(current goals can now be found in the care plan section)  Progress towards OT goals: Not progressing toward goals - comment (pain )  Acute Rehab OT Goals Patient Stated Goal: be able to walk  again ADL Goals Pt Will Perform Grooming: with min guard assist;standing Pt Will Perform Lower Body Dressing: with min assist Pt Will Transfer to Toilet: with min guard assist;ambulating;bedside commode Pt Will Perform Toileting - Clothing Manipulation and hygiene: sit to/from stand;with min guard assist Additional ADL Goal #1: Pt will be S in and OOB for basic ADLs with HOB flat and no rails  Plan Discharge plan needs to be updated (but if pt unable to progress, may need to revisit D/C plan )    Co-evaluation    PT/OT/SLP Co-Evaluation/Treatment: Yes Reason for Co-Treatment: For patient/therapist safety PT goals addressed during session: Balance;Strengthening/ROM;Mobility/safety with mobility OT goals addressed during session: ADL's and self-care      End of Session Equipment Utilized During Treatment: Rolling walker   Activity Tolerance Patient limited by pain   Patient Left in bed;with call bell/phone within reach;with family/visitor present;with nursing/sitter in room   Nurse Communication Mobility status;Patient requests pain meds        Time: 7564-3329 OT Time Calculation (min): 32 min  Charges: OT General Charges $OT Visit: 1 Procedure OT Treatments $Therapeutic Activity: 8-22 mins  Ariea Rochin M 03/02/2016, 4:14 PM

## 2016-03-03 DIAGNOSIS — R079 Chest pain, unspecified: Secondary | ICD-10-CM | POA: Insufficient documentation

## 2016-03-03 DIAGNOSIS — R0789 Other chest pain: Secondary | ICD-10-CM

## 2016-03-03 LAB — GLUCOSE, CAPILLARY
GLUCOSE-CAPILLARY: 153 mg/dL — AB (ref 65–99)
GLUCOSE-CAPILLARY: 141 mg/dL — AB (ref 65–99)
GLUCOSE-CAPILLARY: 154 mg/dL — AB (ref 65–99)
Glucose-Capillary: 169 mg/dL — ABNORMAL HIGH (ref 65–99)

## 2016-03-03 LAB — BASIC METABOLIC PANEL
Anion gap: 7 (ref 5–15)
BUN: 6 mg/dL (ref 6–20)
CHLORIDE: 99 mmol/L — AB (ref 101–111)
CO2: 24 mmol/L (ref 22–32)
CREATININE: 0.83 mg/dL (ref 0.61–1.24)
Calcium: 7.6 mg/dL — ABNORMAL LOW (ref 8.9–10.3)
GFR calc non Af Amer: >60 mL/min (ref >60)
Glucose, Bld: 140 mg/dL — ABNORMAL HIGH (ref 65–99)
POTASSIUM: 3.8 mmol/L (ref 3.5–5.1)
Sodium: 130 mmol/L — ABNORMAL LOW (ref 135–145)

## 2016-03-03 LAB — CBC
HCT: 15.5 % — ABNORMAL LOW (ref 39.0–52.0)
HEMOGLOBIN: 5 g/dL — AB (ref 13.0–17.0)
MCH: 30.9 pg (ref 26.0–34.0)
MCHC: 32.9 g/dL (ref 30.0–36.0)
MCV: 93.9 fL (ref 78.0–100.0)
PLATELETS: 226 10*3/uL (ref 150–400)
RBC: 1.65 MIL/uL — AB (ref 4.22–5.81)
RDW: 17.3 % — ABNORMAL HIGH (ref 11.5–15.5)
WBC: 9.4 10*3/uL (ref 4.0–10.5)

## 2016-03-03 LAB — HEMOGLOBIN AND HEMATOCRIT, BLOOD
HCT: 30.1 % — ABNORMAL LOW (ref 39.0–52.0)
Hemoglobin: 10.1 g/dL — ABNORMAL LOW (ref 13.0–17.0)

## 2016-03-03 LAB — PREPARE RBC (CROSSMATCH)

## 2016-03-03 LAB — OSMOLALITY: OSMOLALITY: 273 mosm/kg — AB (ref 275–295)

## 2016-03-03 LAB — TSH: TSH: 3.2 u[IU]/mL (ref 0.350–4.500)

## 2016-03-03 MED ORDER — NITROGLYCERIN 0.4 MG SL SUBL
SUBLINGUAL_TABLET | SUBLINGUAL | Status: AC
  Administered 2016-03-03: 0.4 mg
  Filled 2016-03-03: qty 1

## 2016-03-03 MED ORDER — NITROGLYCERIN IN D5W 200-5 MCG/ML-% IV SOLN
0.0000 ug/min | INTRAVENOUS | Status: DC
  Administered 2016-03-03: 5 ug/min via INTRAVENOUS
  Filled 2016-03-03: qty 250

## 2016-03-03 MED ORDER — SODIUM CHLORIDE 0.9 % IV SOLN
Freq: Once | INTRAVENOUS | Status: AC
  Administered 2016-03-03: 08:00:00 via INTRAVENOUS

## 2016-03-03 MED ORDER — PROMETHAZINE HCL 25 MG/ML IJ SOLN
12.5000 mg | Freq: Four times a day (QID) | INTRAMUSCULAR | Status: DC | PRN
  Administered 2016-03-03: 12.5 mg via INTRAVENOUS
  Filled 2016-03-03: qty 1

## 2016-03-03 MED ORDER — NITROGLYCERIN IN D5W 200-5 MCG/ML-% IV SOLN
INTRAVENOUS | Status: AC
  Filled 2016-03-03: qty 250

## 2016-03-03 MED ORDER — HYDROMORPHONE HCL 1 MG/ML IJ SOLN
1.0000 mg | INTRAMUSCULAR | Status: DC | PRN
  Administered 2016-03-03 (×2): 1 mg via INTRAVENOUS
  Administered 2016-03-04: 1.5 mg via INTRAVENOUS
  Administered 2016-03-04: 1 mg via INTRAVENOUS
  Administered 2016-03-04 (×3): 1.5 mg via INTRAVENOUS
  Administered 2016-03-05: 1 mg via INTRAVENOUS
  Administered 2016-03-05 – 2016-03-06 (×5): 1.5 mg via INTRAVENOUS
  Administered 2016-03-08 – 2016-03-09 (×2): 1 mg via INTRAVENOUS
  Administered 2016-03-09: 15:00:00 1.5 mg via INTRAVENOUS
  Administered 2016-03-10 – 2016-03-12 (×8): 1 mg via INTRAVENOUS
  Administered 2016-03-13: 1.5 mg via INTRAVENOUS
  Administered 2016-03-13 (×2): 1 mg via INTRAVENOUS
  Administered 2016-03-13 – 2016-03-14 (×2): 1.5 mg via INTRAVENOUS
  Administered 2016-03-14 – 2016-03-16 (×9): 1 mg via INTRAVENOUS
  Filled 2016-03-03 (×3): qty 2
  Filled 2016-03-03 (×3): qty 1
  Filled 2016-03-03: qty 2
  Filled 2016-03-03 (×6): qty 1
  Filled 2016-03-03: qty 2
  Filled 2016-03-03: qty 1
  Filled 2016-03-03 (×2): qty 2
  Filled 2016-03-03: qty 1
  Filled 2016-03-03: qty 2
  Filled 2016-03-03 (×2): qty 1
  Filled 2016-03-03: qty 2
  Filled 2016-03-03: qty 1
  Filled 2016-03-03: qty 2
  Filled 2016-03-03: qty 1
  Filled 2016-03-03 (×3): qty 2
  Filled 2016-03-03 (×3): qty 1
  Filled 2016-03-03 (×2): qty 2
  Filled 2016-03-03 (×5): qty 1

## 2016-03-03 NOTE — Progress Notes (Signed)
Patint c/o chest pain rated 7/10. Patients points to epigastric area. EKG done and MD paged. Patient refusing NTG SL and also refusing Maalox.

## 2016-03-03 NOTE — Progress Notes (Signed)
   VASCULAR SURGERY ASSESSMENT & PLAN:  Fasciotomy site inspected. No further bleeding. Dressing reapplied.  Continue daily dressing changes with hydrogel, moist 4 x 4's, Kerlix, and Ace bandage.  SUBJECTIVE: pain adequately controlled.  PHYSICAL EXAM: Filed Vitals:   03/03/16 1130 03/03/16 1150 03/03/16 1200 03/03/16 1215  BP: 110/75 107/64  103/69  Pulse: 91 88 88   Temp: 98.7 F (37.1 C) 98.5 F (36.9 C)  98.2 F (36.8 C)  TempSrc: Oral Oral  Oral  Resp: 15 16 15    Height:      Weight:      SpO2: 97% 99% 100%    Dressing change. Lateral wound looks okay. Medial wounds with no further bleeding. Continues to have moderate swelling. Right foot warm and well-perfused.  LABS: Lab Results  Component Value Date   WBC 9.4 03/03/2016   HGB 5.0* 03/03/2016   HCT 15.5* 03/03/2016   MCV 93.9 03/03/2016   PLT 226 03/03/2016   Lab Results  Component Value Date   CREATININE 0.78 03/02/2016   Lab Results  Component Value Date   INR 1.11 02/21/2016   CBG (last 3)   Recent Labs  03/02/16 1647 03/02/16 2148 03/03/16 0735  GLUCAP 111* 219* 153*    Principal Problem:   LV (left ventricular) mural thrombus (HCC) Active Problems:   Anterior subendocardial MI (HCC)   Right leg pain   Non-STEMI (non-ST elevated myocardial infarction) (HCC)   Elevated troponin   Abnormal EKG   Diabetes (HCC)   Acute MI (HCC)   History of embolectomy   Pain   Thromboembolism (HCC)   Vascular occlusion   Fever   NSTEMI (non-ST elevated myocardial infarction) [facility])   Cari Troy Yu: [phone] 03/03/2016

## 2016-03-03 NOTE — Progress Notes (Addendum)
PROGRESS NOTE    Troy Yu  ZHY:[id number] DOB: 10/02/1960 DOA: 02/21/2016 PCP: No primary care provider on file.   Brief Narrative:  Troy Yu is an 55 y.o. male who was in his usual state of health until about 2 weeks ago when he had substernal chest pain that lasted about 48 hours.The pain went away after he vomited, and he attributed his symptoms to a prolonged religious fast. He then come to the [facility] [address] ED on 02/21/16 with severe RLE pain associated with paresthesias. Upon presentation, he was found to have an abnormal EKG and elevated troponin. Cardiology was subsequently called to evaluate the patient, and he was felt to have suffered a subacute anterior MI. STAT echo was ordered and EDP advised to order an arterial duplex with recommendation to consult vascular surgery. IV heparin ordered and the patient was transferred to [facility] for further evaluation by vascular surgery, as it was felt that he had a right femoral embolism with concern for threatened limb. Dr Imogene Burn subsequently performed a right femoral embolectomy and right calf compartment fasciotomies x 4 . At 4:16 a.m. On 02/22/16, the patient complained of loss of sensation to RLE, and the patient reports he was in severe pain. He was given pain medication which did not alleviate his symptoms. He was re-evaluated by Dr. Imogene Burn at 7:33 a.m. Who removed the ACE wrap from his RLE which relieved the pain, and the patient was felt to have excessive bleeding in his calf related to full anti-coagulation. He was taken back to surgery for evacuation of hematoma and VAC dressings. Cardiologist continues to follow for management of his subacute MI. Patient underwent closure of lateral fasciotomy. Plastic surgery consulted and recommendations given..  7/6 patient's hemoglobin dropped to 5 from continuous oozing from the leg wound, and his heparin was stopped on 7/5.  Dr Early recommended surgical evaluation by plastic surgery, but patient does not  want any further surgery at this time and wanted to address the bleeding from the leg wound with local wound care. Explained to him that he needs IV heparin for his LV thrombus and he is at risk from cardiac complications without IV heparin. And at the same time  he continues to bleed on IV heparin, he needs the  Leg wound to be evaluated by the plastic surgery for possible skin graft surgery.    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  -Case taken over by Dr. Arbie Cookey after patient requested a second opinion.  -Lipids well controlled, cholesterol 116, LDL 60.  -s/p closure of lateral fasciotomy site and placement of VAC on medial fasciotomy site -Plastic surgery consulted by vascular for possible STSG to medial fasciotomy site -Wound vac removed (medial) and wet-to-dry dressings - 7/4 leg wound continues to ooze blood and dressing changed several times and Dr Early contacted and IV heparin was discontinued.  - 7/6 his hemoglobin dropped to 5 and he received 3 units of prbc transfusion. IV heparin still on hold.  - discussed with Dr Clifton James.   Symptomatic anemia/ Postoperative anemia/ Anemia secondary to blood loss -During hospitalization, hemoglobin has dropped to as low as 4.7 , continues to drop , requiring many prbc transfusions.  -Has received a total of 10 u  PRBCs this admission -Hemoglobin 5 this am and he received 3 units of prbc transfusion, and the last bag is running.  -Continue to monitor CBC intermittently.  - anemia from blood loss from continuous oozing from the leg wound. IV heparin was held on 7/5.  - transfuse to keep hemoglobin greater than 8.  - repeat  hemoglobin at 4 pm today.   Fever -Resolved, Continue tylenol PRN -Continue to monitor closely  Syncope and orthostasis -Likely secondary to blood loss -Continue to monitor closely  Anterior subendocardial MI (HCC)/Abnormal EKG/Elevated troponin -On therapeutic dose heparin, which was held on 7/4 for continuous oozing from the leg wound.  -Cardiology consulted and appreciated -Continue Inderal -ACE-I to be started when BP able to tolerate -Will eventually need cardiac cath  Newly diagnosed diabetes mellitus/Hyperglycemia -Hemoglobin A1c 9.2.  -Continue lantus, ISS and CBG monitoring.  -Diabetes coordinator consulted - CBG (last 3)   Recent Labs  03/02/16 1647 03/02/16 2148 03/03/16 0735  GLUCAP 111* 219* 153*      Hyponatremia Get serum osmolarity, urine osmo, TSH and am cortisol.  -Continue to monitor BMP, repeat bmp pending.   Constipation -continue colace. Improved.  -Fleet and dulcolax supp PRN  DVT Prophylaxis  None, IV heparin stopped for acute bleed, cannot place SCD's because of leg wound.   Code Status: Full  Family Communication: Son at bedside  Disposition Plan:  Pending  further recommendations from vascular surgery. Continue to monitor hemoglobin.  Consultants Cardiology  Vascular Surgery Plastic surgery  Procedures  Right femoral embolectomy Right calf four compartment fasciotomies  Evacuation of hematoma from bilateral right calf fasciotomies Placement of negative pressure dressings x2 Closure of lateral fasciotomy site and placement of VAC on medial fasciotomy site  Antibiotics   Anti-infectives    Start     Dose/Rate Route Frequency Ordered Stop   02/26/16 0930  [MAR Hold]  cefUROXime (ZINACEF) 1.5 g in dextrose 5 % 50 mL IVPB     (MAR Hold since 02/26/16 0928)   1.5 g 100 mL/hr over 30 Minutes Intravenous To [facility] 02/25/16 1055 02/26/16 1120   02/22/16 0800  cefUROXime (ZINACEF) 1.5 g in dextrose 5 % 50 mL IVPB      1.5 g 100 mL/hr over 30 Minutes Intravenous Every 12 hours 02/21/16 2115 02/22/16 2140   02/22/16 0600  cefUROXime (ZINACEF) injection 1.5 g     1.5 g Intramuscular On call to O.R. 02/21/16 1523 02/21/16 1713      Subjective:   Blythe Darley seen and examined today. He is lethargic.   Objective:   Filed Vitals:   03/03/16 0800 03/03/16 0930 03/03/16 0932 03/03/16 1000  BP: 98/60 106/66 98/64 109/69  Pulse: 92 94 93 93  Temp: 98.8 F (37.1 C) 97.8 F (36.6 C) 98 F (36.7 C)   TempSrc: Oral Oral Oral   Resp: 15 23 15 17   Height:      Weight:      SpO2: 100% 99% 100% 100%    Intake/Output Summary (Last 24 hours) at 03/03/16 1123 Last data filed at 03/03/16 1100  Gross per 24 hour  Intake  909.5 ml  Output   3700 ml  Net -2790.5 ml   Filed Weights   02/28/16 0255 02/29/16 0303 03/03/16 0416  Weight: 104.9 kg (231 lb 4.2 oz) 103.4 kg (227 lb 15.3 oz) 101.742 kg (224 lb 4.8 oz)    Exam  General: Well developed, well nourished, no apparent distress  HEENT: NCAT, mucous membranes moist.  Cardiovascular: S1 S2 auscultated, no murmurs, RRR  Respiratory: Clear to auscultation bilaterally with equal chest rise.  Abdomen: Soft, nontender, nondistended, + bowel sounds  Extremities: warm dry without cyanosis clubbing or edema. RLE wrapped, continuous oozing from the leg wound.   Neuro: AAOx3, frustrated , nonfocal  Psych: angry and frustrated.   Data Reviewed: I have personally reviewed following labs and imaging studies  CBC:  Recent Labs Lab 02/26/16 0812  02/27/16 0330 02/28/16 0259  02/29/16 0509 02/29/16 2349 03/01/16 1112 03/02/16 0229 03/02/16 0817 03/03/16 0526  WBC 12.9*  --  16.5* 11.8*  --  9.7  --   --  11.2*  --  9.4  NEUTROABS 8.5*  --   --   --   --   --   --   --   --   --   --   HGB 6.6*  6.3*  < > 9.0* 7.9*  < > 7.7* 5.5* 8.7* 7.0* 7.7* 5.0*  HCT 19.6*  18.7*  < > 25.9* 23.3*  < > 22.9* 16.9* 25.0* 20.3* 22.7* 15.5*  MCV 88.2  --   82.7 86.6  --  89.1  --   --  91.0  --  93.9  PLT 223  --  198 209  --  239  --   --  212  --  226  < > = values in this interval not displayed. Basic Metabolic Panel:  Recent Labs Lab 02/27/16 0330 02/28/16 0259 02/29/16 0509 03/01/16 0904 03/02/16 0229  NA 128* 128* 130* 126* 129*  K 4.1 3.9 3.9 4.5 3.8  CL 100* 98* 100* 96* 97*  CO2 23 24 24 24 25   GLUCOSE 212* 162* 250* 194* 212*  BUN 15 10 8 11 9   CREATININE 1.01 0.85 0.85 0.90 0.78  CALCIUM 7.0* 7.3* 7.6* 7.3* 7.3*   GFR: Estimated Creatinine Clearance: 119.1 mL/min (by C-G formula based on Cr of 0.78). Liver Function Tests: No results for input(s): AST, ALT, ALKPHOS, BILITOT, PROT, ALBUMIN in the last 168 hours. No results for input(s): LIPASE, AMYLASE in the last 168 hours. No results for input(s): AMMONIA in the last 168 hours. Coagulation Profile: No results for input(s): INR, PROTIME in the last 168 hours. Cardiac Enzymes: No results for input(s): CKTOTAL, CKMB, CKMBINDEX, TROPONINI in the last 168 hours. BNP (last 3 results) No results for input(s): PROBNP in the last 8760 hours. HbA1C: No results for input(s): HGBA1C in the last 72 hours. CBG:  Recent Labs Lab 03/02/16 0717 03/02/16 1201 03/02/16 1647 03/02/16 2148 03/03/16 0735  GLUCAP 174* 203* 111* 219* 153*   Lipid Profile: No results for input(s): CHOL, HDL, LDLCALC, TRIG, CHOLHDL, LDLDIRECT in the last 72 hours. Thyroid Function Tests: No results for input(s): TSH, T4TOTAL, FREET4, T3FREE, THYROIDAB in the last 72 hours. Anemia Panel: No results for input(s): VITAMINB12, FOLATE, FERRITIN, TIBC, IRON, RETICCTPCT in the last 72 hours. Urine analysis:    Component Value Date/Time   COLORURINE YELLOW 02/26/2016 0229   APPEARANCEUR CLEAR 02/26/2016 0229   LABSPEC 1.014 02/26/2016 0229   PHURINE 6.0 02/26/2016 0229   GLUCOSEU 500* 02/26/2016 0229   HGBUR NEGATIVE 02/26/2016 0229   BILIRUBINUR NEGATIVE 02/26/2016 0229   KETONESUR NEGATIVE  02/26/2016 0229   PROTEINUR NEGATIVE 02/26/2016 0229   NITRITE NEGATIVE 02/26/2016 0229   LEUKOCYTESUR NEGATIVE 02/26/2016 0229   Sepsis Labs: @LABRCNTIP (procalcitonin:4,lacticidven:4)  ) Recent Results (from the past 240 hour(s))  Culture, blood (routine x 2)     Status:  None   Collection Time: 02/26/16  8:12 AM  Result Value Ref Range Status   Specimen Description BLOOD BLOOD LEFT HAND  Final   Special Requests IN PEDIATRIC BOTTLE 2.5CC  Final   Culture NO GROWTH 5 DAYS  Final   Report Status 03/02/2016 FINAL  Final  Culture, blood (routine x 2)     Status: None   Collection Time: 02/26/16  8:26 AM  Result Value Ref Range Status   Specimen Description BLOOD LEFT ANTECUBITAL  Final   Special Requests IN PEDIATRIC BOTTLE 1CC  Final   Culture NO GROWTH 5 DAYS  Final   Report Status 03/02/2016 FINAL  Final      Radiology Studies: No results found.   Scheduled Meds: . aspirin  325 mg Oral Daily  . atorvastatin  80 mg Oral q1800  . docusate sodium  100 mg Oral Daily  . insulin aspart  0-15 Units Subcutaneous TID WC  . insulin aspart  0-5 Units Subcutaneous QHS  . insulin aspart  6 Units Subcutaneous TID WC  . insulin glargine  20 Units Subcutaneous QHS  . pantoprazole  40 mg Oral Daily  . propranolol  10 mg Oral TID   Continuous Infusions:     LOS: 11 days   Time Spent in minutes   30 minutes  Brynnly Bonet D.O. on 03/03/2016 at 11:23 AM  [phone]  After 7pm go to [email or web address] - password [email or web address]  And look for the night coverage person covering for me after hours  [facility] Office  [phone]

## 2016-03-03 NOTE — Progress Notes (Addendum)
Patient agreed to take NGT and maalox after after E.Smith NP talked with patient

## 2016-03-03 NOTE — Progress Notes (Signed)
SUBJECTIVE: No chest pain or dyspnea.   Tele: sinus  BP 94/59 mmHg  Pulse 93  Temp(Src) 98.8 F (37.1 C) (Oral)  Resp 14  Ht  (1.727 m)  Wt 224 lb 4.8 oz (101.742 kg)  BMI 34.11 kg/m2  SpO2 100%  Intake/Output Summary (Last 24 hours) at 03/03/16 0740 Last data filed at 03/03/16 0600  Gross per 24 hour  Intake    287 ml  Output   3275 ml  Net  -2988 ml    PHYSICAL EXAM General: Well developed, well nourished, in no acute distress. Alert and oriented x 3.  Psych:  Good affect, responds appropriately Neck: No JVD. No masses noted.  Lungs: Clear bilaterally with no wheezes or rhonci noted.  Heart: RRR with no murmurs noted. Abdomen: Bowel sounds are present. Soft, non-tender.  Extremities: wound vac right lower ext. No edema left leg.   LABS: Basic Metabolic Panel:  Recent Labs  16/10/96 0904 03/02/16 0229  NA 126* 129*  K 4.5 3.8  CL 96* 97*  CO2 24 25  GLUCOSE 194* 212*  BUN 11 9  CREATININE 0.90 0.78  CALCIUM 7.3* 7.3*   CBC:  Recent Labs  03/02/16 0229 03/02/16 0817 03/03/16 0526  WBC 11.2*  --  9.4  HGB 7.0* 7.7* 5.0*  HCT 20.3* 22.7* 15.5*  MCV 91.0  --  93.9  PLT 212  --  226   Current Meds: . sodium chloride   Intravenous Once  . aspirin  325 mg Oral Daily  . atorvastatin  80 mg Oral q1800  . docusate sodium  100 mg Oral Daily  . insulin aspart  0-15 Units Subcutaneous TID WC  . insulin aspart  0-5 Units Subcutaneous QHS  . insulin aspart  6 Units Subcutaneous TID WC  . insulin glargine  20 Units Subcutaneous QHS  . pantoprazole  40 mg Oral Daily  . propranolol  10 mg Oral TID     ASSESSMENT AND PLAN: 55 y.o. male admitted with chest pain and right leg pain. Cardiology was called to evaluate the patient, and he was felt to have suffered a subacute anterior MI. STAT echo was ordered and EDP advised to order an arterial duplex with recommendation to consult vascular surgery. IV heparin ordered and the patient was transferred  to [facility] for further evaluation by vascular surgery, as it was felt that he had a right femoral embolism with concern for threatened limb. Dr Imogene Burn subsequently performed a right femoral embolectomy and right calf compartment fasciotomies x 4 . On 02/22/16, the patient complained of loss of sensation to RLE, and the patient reports he was in severe pain. He was re-evaluated by Dr. Imogene Burn and was felt to have excessive bleeding in his calf related to full anti-coagulation. He was taken back to surgery for evacuation of hematoma and VAC dressings. He has a wound vac on the right leg wound and is being followed closely by Vascular surgery.   1. Subacute anterior MI: likely occurred 2-3 weeks prior to arrival. Echo 02/21/2016 EF 30-35%, anteroapical infarct with large thrombus burden at apex, mobile and high embolic potential. He is still on a heparin drip. Would anticipate initiation of coumadin but with ongoing blood loss from wound, timing will be tricky. Once his right lower extremity has healed, will need cardiac cath. I would not anticipate this would be done anytime soon with ongoing blood loss and anemia. Continue ASA, statin and beta blocker. Add Ace-inh when BP  allows.   2. LV thrombus: He has been on heparin but this in on hold with bleeding from the lower extremity wound and anemia. Anti-coagulation is important given his LV thrombus and prior embolization. ? If there is anything else that can be done surgically to control the bleeding. Will need coumadin before discharge but the bleeding from his wound will need to stop before we can start coumadin.    3. Ischemic leg secondary to embolism: Per vascular surgery.  4. Postop anemia:H/H continues to drop with oozing of the leg wound on heparin. Hgb 5.0 this am. He is being transfused per primary team.       Troy Yu  7/6/20177:40 AM

## 2016-03-03 NOTE — Progress Notes (Signed)
NP smith increased NTG drip to 76mcg/min

## 2016-03-03 NOTE — Progress Notes (Signed)
PT Cancellation Note  Patient Details Name: Troy Yu MRN: [id number] DOB: 07-30-1961   Cancelled Treatment:    Reason Eval/Treat Not Completed: Medical issues which prohibited therapy. Noted excessive bleeding from RLE overnight with Hgb down to 5.0. Attempted to see pt for bed exercises only, however pt working with nursing. He agreed to attempt exercises, but "not right now."   Shann Lewellyn 03/03/2016, 10:43 AM Pager [phone]

## 2016-03-03 NOTE — Progress Notes (Signed)
Seen today on call for chest pain - actually, seems like mid-epigastric discomfort with tenderness. Given nitro with equivocal relief. EKG from today shows evolving anterior MI with TWI's in V2-V4. Transfused from Hgb 5 -> 10 today. Vitals stable. Now saying he wants to make himself throw up, says it will make him feel better. No benefit from Maalox. Offered Zofran, but he declined. Wants to sleep. Will give phenergan for nausea. Not a candidate for cath at this time given significant PAD and recent blood loss with ischemic leg and fasciotomy. Covering fellow Dr. Clarnce Flock aware.  Chrystie Nose, MD, [facility] Attending Cardiologist [facility]

## 2016-03-04 DIAGNOSIS — I255 Ischemic cardiomyopathy: Secondary | ICD-10-CM

## 2016-03-04 LAB — TYPE AND SCREEN
ABO/RH(D): A POS
ANTIBODY SCREEN: NEGATIVE
UNIT DIVISION: 0
UNIT DIVISION: 0
UNIT DIVISION: 0
UNIT DIVISION: 0
Unit division: 0

## 2016-03-04 LAB — GLUCOSE, CAPILLARY
GLUCOSE-CAPILLARY: 138 mg/dL — AB (ref 65–99)
GLUCOSE-CAPILLARY: 93 mg/dL (ref 65–99)
Glucose-Capillary: 130 mg/dL — ABNORMAL HIGH (ref 65–99)
Glucose-Capillary: 148 mg/dL — ABNORMAL HIGH (ref 65–99)

## 2016-03-04 LAB — CBC
HCT: 26.2 % — ABNORMAL LOW (ref 39.0–52.0)
HEMOGLOBIN: 9 g/dL — AB (ref 13.0–17.0)
MCH: 30.4 pg (ref 26.0–34.0)
MCHC: 34.4 g/dL (ref 30.0–36.0)
MCV: 88.5 fL (ref 78.0–100.0)
Platelets: 227 10*3/uL (ref 150–400)
RBC: 2.96 MIL/uL — ABNORMAL LOW (ref 4.22–5.81)
RDW: 16.5 % — ABNORMAL HIGH (ref 11.5–15.5)
WBC: 11.9 10*3/uL — AB (ref 4.0–10.5)

## 2016-03-04 LAB — BASIC METABOLIC PANEL
Anion gap: 6 (ref 5–15)
BUN: 7 mg/dL (ref 6–20)
CO2: 23 mmol/L (ref 22–32)
CREATININE: 0.74 mg/dL (ref 0.61–1.24)
Calcium: 7.2 mg/dL — ABNORMAL LOW (ref 8.9–10.3)
Chloride: 99 mmol/L — ABNORMAL LOW (ref 101–111)
Glucose, Bld: 151 mg/dL — ABNORMAL HIGH (ref 65–99)
POTASSIUM: 3.7 mmol/L (ref 3.5–5.1)
SODIUM: 128 mmol/L — AB (ref 135–145)

## 2016-03-04 LAB — CORTISOL: CORTISOL PLASMA: 17.6 ug/dL

## 2016-03-04 NOTE — Progress Notes (Signed)
PT Cancellation Note  Patient Details Name: Troy Yu MRN: [id number] DOB: December 02, 1960   Cancelled Treatment:    Reason Eval/Treat Not Completed: Medical issues which prohibited therapy. Heparin therapy has been stopped and pt w/ large thrombus.  Will hold PT until pt more medically appropriate but will continue to follow acutely.   Encarnacion Chu PT, DPT  Pager: [phone] Phone: [phone] 03/04/2016, 2:45 PM

## 2016-03-04 NOTE — Progress Notes (Signed)
PROGRESS NOTE    Troy Yu  UJW:[id number] DOB: June 17, 1961 DOA: 02/21/2016 PCP: No primary care provider on file.   Brief Narrative:  Troy Yu is an 55 y.o. male who was in his usual state of health until about 2 weeks ago when he had substernal chest pain that lasted about 48 hours.The pain went away after he vomited, and he attributed his symptoms to a prolonged religious fast. He then come to the [facility] ED on 02/21/16 with severe RLE pain associated with paresthesias. Upon presentation, he was found to have an abnormal EKG and elevated troponin. Cardiology was subsequently called to evaluate the patient, and he was felt to have suffered a subacute anterior MI. STAT echo was ordered and EDP advised to order an arterial duplex with recommendation to consult vascular surgery. IV heparin ordered and the patient was transferred to [facility] for further evaluation by vascular surgery, as it was felt that he had a right femoral embolism with concern for threatened limb. Dr Imogene Burn subsequently performed a right femoral embolectomy and right calf compartment fasciotomies x 4 . At 4:16 a.m. On 02/22/16, the patient complained of loss of sensation to RLE, and the patient reports he was in severe pain. He was given pain medication which did not alleviate his symptoms. He was re-evaluated by Dr. Imogene Burn at 7:33 a.m. Who removed the ACE wrap from his RLE which relieved the pain, and the patient was felt to have excessive bleeding in his calf related to full anti-coagulation. He was taken back to surgery for evacuation of hematoma and VAC dressings. Cardiologist continues to follow for management of his subacute MI. Patient underwent closure of lateral fasciotomy. Plastic surgery consulted and recommendations given..  7/6 patient's hemoglobin dropped to 5 from continuous oozing from the leg wound, and his heparin was stopped on 7/5.  Dr Early recommended surgical evaluation by plastic surgery, but patient does not  want any further surgery at this time and wanted to address the bleeding from the leg wound with local wound care. Explained to him that he needs IV heparin for his LV thrombus and he is at risk from cardiac complications without IV heparin. And at the same time  he continues to bleed on IV heparin, he needs the  Leg wound to be evaluated by the plastic surgery for possible skin graft surgery.   7/7 pt 's hemoglobin improved to 10 , dropped back to 9. Today he is refusing to have his dressing changed despite the surgeon's recommendation.   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  -Case taken over by Dr. Arbie Cookey after patient requested a second opinion.  -Lipids well controlled, cholesterol 116, LDL 60.  -s/p closure of lateral fasciotomy site and placement of VAC on medial fasciotomy site -Plastic surgery consulted by vascular for possible STSG to medial fasciotomy site -Wound vac removed (medial) and wet-to-dry dressings - 7/4 leg wound continues to ooze blood and dressing changed several times and Dr Early contacted and IV heparin was discontinued.  - 7/6 his hemoglobin dropped to 5 and he received 3 units of prbc transfusion. IV heparin still on hold. Repeat hemoglobin improved .  - discussed with Dr Clifton James.   Symptomatic anemia/ Postoperative anemia/ Anemia secondary  to blood loss -During hospitalization, hemoglobin has dropped to as low as 4.7 , continues to drop , requiring many prbc transfusions.  -Has received a total of 10 u PRBCs this admission -Hemoglobin 5 on 7/6 and he received 3 units of prbc transfusion and the repeat hemoglobin is 10-9.  -Continue to monitor CBC  intermittently.  - anemia from blood loss from continuous oozing from the leg wound. IV heparin was held on 7/5.  - transfuse to keep hemoglobin greater than 8.    Fever -Resolved, Continue tylenol PRN -Continue to monitor closely  Syncope and orthostasis -Likely secondary to blood loss -Continue to monitor closely  Anterior subendocardial MI (HCC)/Abnormal EKG/Elevated troponin -On therapeutic dose heparin, which was held on 7/4 for continuous oozing from the leg wound.  -Cardiology consulted and appreciated -Continue Inderal -ACE-I to be started when BP able to tolerate -Will eventually need cardiac cath  Newly diagnosed diabetes mellitus/Hyperglycemia -Hemoglobin A1c 9.2.  -Continue lantus, ISS and CBG monitoring.  -Diabetes coordinator consulted - CBG (last 3)   Recent Labs  03/03/16 1710 03/03/16 2114 03/04/16 0810  GLUCAP 154* 169* 138*      Hyponatremia Get serum osmolarity is 273 , urine osmo pending, TSH and am cortisol normal limits. .  -Continue to monitor BMP intermittently.  Constipation -continue colace. Improved.  -Fleet and dulcolax supp PRN  DVT Prophylaxis  None, IV heparin stopped for acute bleed, cannot place SCD's because of leg wound.   Code Status: Full  Family Communication: Son at bedside  Disposition Plan:  Pending  further recommendations from vascular surgery and cardiology. . Continue to monitor hemoglobin.  Consultants Cardiology  Vascular Surgery Plastic surgery  Procedures  Right femoral embolectomy Right calf four compartment fasciotomies  Evacuation of hematoma from bilateral right calf fasciotomies Placement of negative pressure dressings x2 Closure of lateral fasciotomy site and placement of VAC on medial fasciotomy site  Antibiotics   Anti-infectives    Start     Dose/Rate Route Frequency Ordered Stop   02/26/16 0930  [MAR Hold]  cefUROXime (ZINACEF) 1.5 g in dextrose 5 % 50 mL IVPB     (MAR Hold since 02/26/16  0928)   1.5 g 100 mL/hr over 30 Minutes Intravenous To ShortStay Surgical 02/25/16 1055 02/26/16 1120   02/22/16 0800  cefUROXime (ZINACEF) 1.5 g in dextrose 5 % 50 mL IVPB     1.5 g 100 mL/hr over 30 Minutes Intravenous Every 12 hours 02/21/16 2115 02/22/16 2140   02/22/16 0600  cefUROXime (ZINACEF) injection 1.5 g     1.5 g Intramuscular On call to O.R. 02/21/16 1523 02/21/16 1713      Subjective:   Virgal Tory seen and examined today, refusing to have dressing changed. He wants one day rest.   Objective:   Filed Vitals:   03/04/16 0800 03/04/16 0812 03/04/16 0900 03/04/16 0903  BP: 109/65 109/65 107/65 107/65  Pulse: 81 85 86 88  Temp:  100.8 F (38.2 C)    TempSrc:  Oral    Resp: 0 24 23   Height:      Weight:      SpO2: 100% 100% 96%     Intake/Output Summary (Last 24 hours) at 03/04/16 0922 Last data filed at 03/04/16 0900  Gross per 24 hour  Intake 1308.23 ml  Output   2475 ml  Net -1166.77 ml   Filed Weights   02/29/16 0303 03/03/16 0416 03/04/16 0500  Weight: 103.4 kg (227 lb 15.3 oz) 101.742  kg (224 lb 4.8 oz) 102.2 kg (225 lb 5 oz)    Exam  General: Well developed, well nourished, no apparent distress  HEENT: NCAT, mucous membranes moist.   Cardiovascular: S1 S2 auscultated, no murmurs, RRR  Respiratory: Clear to auscultation bilaterally with equal chest rise.  Abdomen: Soft, nontender, nondistended, + bowel sounds  Extremities: warm dry without cyanosis clubbing or edema. RLE wrapped, improved oozing from the leg wound.   Neuro: AAOx3, frustrated , nonfocal  Psych: angry and frustrated.   Data Reviewed: I have personally reviewed following labs and imaging studies  CBC:  Recent Labs Lab 02/28/16 0259  02/29/16 0509  03/02/16 0229 03/02/16 0817 03/03/16 0526 03/03/16 1650 03/04/16 0318  WBC 11.8*  --  9.7  --  11.2*  --  9.4  --  11.9*  HGB 7.9*  < > 7.7*  < > 7.0* 7.7* 5.0* 10.1* 9.0*  HCT 23.3*  < > 22.9*  < > 20.3* 22.7*  15.5* 30.1* 26.2*  MCV 86.6  --  89.1  --  91.0  --  93.9  --  88.5  PLT 209  --  239  --  212  --  226  --  227  < > = values in this interval not displayed. Basic Metabolic Panel:  Recent Labs Lab 02/29/16 0509 03/01/16 0904 03/02/16 0229 03/03/16 1650 03/04/16 0318  NA 130* 126* 129* 130* 128*  K 3.9 4.5 3.8 3.8 3.7  CL 100* 96* 97* 99* 99*  CO2 GLUCOSE 250* 194* 212* 140* 151*  BUN CREATININE 0.85 0.90 0.78 0.83 0.74  CALCIUM 7.6* 7.3* 7.3* 7.6* 7.2*   GFR: Estimated Creatinine Clearance: 119.4 mL/min (by C-G formula based on Cr of 0.74). Liver Function Tests: No results for input(s): AST, ALT, ALKPHOS, BILITOT, PROT, ALBUMIN in the last 168 hours. No results for input(s): LIPASE, AMYLASE in the last 168 hours. No results for input(s): AMMONIA in the last 168 hours. Coagulation Profile: No results for input(s): INR, PROTIME in the last 168 hours. Cardiac Enzymes: No results for input(s): CKTOTAL, CKMB, CKMBINDEX, TROPONINI in the last 168 hours. BNP (last 3 results) No results for input(s): PROBNP in the last 8760 hours. HbA1C: No results for input(s): HGBA1C in the last 72 hours. CBG:  Recent Labs Lab 03/03/16 0735 03/03/16 1234 03/03/16 1710 03/03/16 2114 03/04/16 0810  GLUCAP 153* 141* 154* 169* 138*   Lipid Profile: No results for input(s): CHOL, HDL, LDLCALC, TRIG, CHOLHDL, LDLDIRECT in the last 72 hours. Thyroid Function Tests:  Recent Labs  03/03/16 1650  TSH 3.200   Anemia Panel: No results for input(s): VITAMINB12, FOLATE, FERRITIN, TIBC, IRON, RETICCTPCT in the last 72 hours. Urine analysis:    Component Value Date/Time   COLORURINE YELLOW 02/26/2016 0229   APPEARANCEUR CLEAR 02/26/2016 0229   LABSPEC 1.014 02/26/2016 0229   PHURINE 6.0 02/26/2016 0229   GLUCOSEU 500* 02/26/2016 0229   HGBUR NEGATIVE 02/26/2016 0229   BILIRUBINUR NEGATIVE 02/26/2016 0229   KETONESUR NEGATIVE 02/26/2016 0229   PROTEINUR  NEGATIVE 02/26/2016 0229   NITRITE NEGATIVE 02/26/2016 0229   LEUKOCYTESUR NEGATIVE 02/26/2016 0229   Sepsis Labs: (procalcitonin:4,lacticidven:4)  ) Recent Results (from the past 240 hour(s))  Culture, blood (routine x 2)     Status: None   Collection Time: 02/26/16  8:12 AM  Result Value Ref Range Status   Specimen Description BLOOD BLOOD LEFT HAND  Final   Special  Requests IN PEDIATRIC BOTTLE 2.5CC  Final   Culture NO GROWTH 5 DAYS  Final   Report Status 03/02/2016 FINAL  Final  Culture, blood (routine x 2)     Status: None   Collection Time: 02/26/16  8:26 AM  Result Value Ref Range Status   Specimen Description BLOOD LEFT ANTECUBITAL  Final   Special Requests IN PEDIATRIC BOTTLE 1CC  Final   Culture NO GROWTH 5 DAYS  Final   Report Status 03/02/2016 FINAL  Final      Radiology Studies: No results found.   Scheduled Meds: . aspirin  325 mg Oral Daily  . atorvastatin  80 mg Oral q1800  . docusate sodium  100 mg Oral Daily  . insulin aspart  0-15 Units Subcutaneous TID WC  . insulin aspart  0-5 Units Subcutaneous QHS  . insulin aspart  6 Units Subcutaneous TID WC  . insulin glargine  20 Units Subcutaneous QHS  . pantoprazole  40 mg Oral Daily  . propranolol  10 mg Oral TID   Continuous Infusions: . nitroGLYCERIN 10 mcg/min (03/04/16 0800)     LOS: 12 days   Time Spent in minutes   30 minutes  Marlaya Turck D.O. on 03/04/2016 at 9:22 AM  [phone]  After 7pm go to [email or web address] - password [email or web address]  And look for the night coverage person covering for me after hours  [facility] Office  [phone]

## 2016-03-04 NOTE — Progress Notes (Signed)
   VASCULAR SURGERY ASSESSMENT & PLAN:  No further bleeding from medial fasciotomy site.   Continue daily dressing changes with Hydrogel, moist 4X4's (NS), kerlix, and Ace  Continue to mobilize  SUBJECTIVE: He does not want dressing changed today, but I explained that it was necessary for wound healing and need to apply hydrogel daily. He wants to change dressing this afternoon. I explained that I will be in the office.   Complains of mild abd pain  PHYSICAL EXAM: Filed Vitals:   03/04/16 0400 03/04/16 0500 03/04/16 0600 03/04/16 0700  BP: 110/62 116/63 106/62 114/68  Pulse: 86 86 88 89  Temp: 99.1 F (37.3 C)     TempSrc: Oral     Resp: 17 27 20 18   Height:      Weight:  225 lb 5 oz (102.2 kg)    SpO2: 99% 99% 99% 99%   Dressing dry. No bleeding.  Palpable right DP pulse Abdomen soft and NT  LABS: Lab Results  Component Value Date   WBC 11.9* 03/04/2016   HGB 9.0* 03/04/2016   HCT 26.2* 03/04/2016   MCV 88.5 03/04/2016   PLT 227 03/04/2016   Lab Results  Component Value Date   CREATININE 0.74 03/04/2016   Lab Results  Component Value Date   INR 1.11 02/21/2016   CBG (last 3)   Recent Labs  03/03/16 1234 03/03/16 1710 03/03/16 2114  GLUCAP 141* 154* 169*    Principal Problem:   LV (left ventricular) mural thrombus (HCC) Active Problems:   Anterior subendocardial MI (HCC)   Right leg pain   Non-STEMI (non-ST elevated myocardial infarction) (HCC)   Elevated troponin   Abnormal EKG   Diabetes (HCC)   Acute MI (HCC)   History of embolectomy   Pain   Thromboembolism (HCC)   Vascular occlusion   Fever   NSTEMI (non-ST elevated myocardial infarction) [facility])   Chest pain    Cari Caraway Beeper: [phone] 03/04/2016

## 2016-03-04 NOTE — Progress Notes (Signed)
SUBJECTIVE: Mild epigastric pain this am. No recent bowel movement per pt. No chest pain.   Tele: sinus  BP 107/65 mmHg  Pulse 88  Temp(Src) 100.8 F (38.2 C) (Oral)  Resp 23  Ht  (1.727 m)  Wt 225 lb 5 oz (102.2 kg)  BMI 34.27 kg/m2  SpO2 96%  Intake/Output Summary (Last 24 hours) at 03/04/16 1039 Last data filed at 03/04/16 0900  Gross per 24 hour  Intake 968.23 ml  Output   2475 ml  Net -1506.77 ml    PHYSICAL EXAM General: Well developed, well nourished, in no acute distress. Alert and oriented x 3.  Psych:  Good affect, responds appropriately Neck: No JVD. No masses noted.  Lungs: Clear bilaterally with no wheezes or rhonci noted.  Heart: RRR with no murmurs noted. Abdomen: Bowel sounds are present. Soft, non-tender.  Extremities: No edema left leg. Bandage on right lower leg.   LABS: Basic Metabolic Panel:  Recent Labs  16/10/96 1650 03/04/16 0318  NA 130* 128*  K 3.8 3.7  CL 99* 99*  CO2 24 23  GLUCOSE 140* 151*  BUN 6 7  CREATININE 0.83 0.74  CALCIUM 7.6* 7.2*   CBC:  Recent Labs  03/03/16 0526 03/03/16 1650 03/04/16 0318  WBC 9.4  --  11.9*  HGB 5.0* 10.1* 9.0*  HCT 15.5* 30.1* 26.2*  MCV 93.9  --  88.5  PLT 226  --  227   Current Meds: . aspirin  325 mg Oral Daily  . atorvastatin  80 mg Oral q1800  . docusate sodium  100 mg Oral Daily  . insulin aspart  0-15 Units Subcutaneous TID WC  . insulin aspart  0-5 Units Subcutaneous QHS  . insulin aspart  6 Units Subcutaneous TID WC  . insulin glargine  20 Units Subcutaneous QHS  . pantoprazole  40 mg Oral Daily  . propranolol  10 mg Oral TID     ASSESSMENT AND PLAN: 55 y.o. male admitted with chest pain and right leg pain. Cardiology was called to evaluate the patient, and he was felt to have suffered a subacute anterior MI. STAT echo was ordered and EDP advised to order an arterial duplex with recommendation to consult vascular surgery. IV heparin ordered and the patient was  transferred to [facility] for further evaluation by vascular surgery, as it was felt that he had a right femoral embolism with concern for threatened limb. Dr Imogene Burn subsequently performed a right femoral embolectomy and right calf compartment fasciotomies x 4 . On 02/22/16, the patient complained of loss of sensation to RLE, and the patient reports he was in severe pain. He was re-evaluated by Dr. Imogene Burn and was felt to have excessive bleeding in his calf related to full anti-coagulation. He was taken back to surgery for evacuation of hematoma and VAC dressings. He has a wound vac on the right leg wound and is being followed closely by Vascular surgery.   1. Subacute anterior MI: His acute event likely occurred 2-3 weeks prior to arrival. Echo 02/21/2016 EF 30-35%, anteroapical infarct with large thrombus burden at apex, mobile and high embolic potential. EKG with evolving anterior MI. Epigastric pain yesterday without new EKG changes. Plans for cardiac cath delayed given the open lower extremity wound, bleeding and anemia. IV heparin on hold due to bleeding from LE wound. Once his right lower extremity has healed, will need cardiac cath. I would not anticipate this would be done anytime soon with ongoing blood  loss and anemia. Continue ASA, statin and beta blocker. Add Ace-inh when BP allows.   2. LV thrombus: He has been on heparin but this was held 03/02/16 due to bleeding from the lower extremity wound and anemia. Anti-coagulation is important given his LV thrombus and prior embolization.  High risk for recurrent arterial embolization. No plans by surgery team to perform any further surgical procedures on the wound. I would consider restarting his IV heparin today if ok with surgery team. He will need coumadin before discharge but would make sure there is no further bleeding from his wound before starting coumadin.    3. Ischemic leg secondary to embolism: Per vascular surgery.  4. Postop anemia:H/H stable post  transfusion yesterday. (Hgb 5.0 yesterday morning, up to 10 last night and now back down to 9.0 this am). He has had active oozing of the leg wound but this has stopped off of heparin. Will need to restart heparin due to LV thrombus and try to achieve lower end of therapeutic range.       Verne Carrow  7/7/201710:39 AM

## 2016-03-05 ENCOUNTER — Inpatient Hospital Stay (HOSPITAL_COMMUNITY): Payer: Self-pay

## 2016-03-05 LAB — CBC
HCT: 29.3 % — ABNORMAL LOW (ref 39.0–52.0)
HEMOGLOBIN: 9.7 g/dL — AB (ref 13.0–17.0)
MCH: 30.3 pg (ref 26.0–34.0)
MCHC: 33.1 g/dL (ref 30.0–36.0)
MCV: 91.6 fL (ref 78.0–100.0)
PLATELETS: 273 10*3/uL (ref 150–400)
RBC: 3.2 MIL/uL — AB (ref 4.22–5.81)
RDW: 16.7 % — ABNORMAL HIGH (ref 11.5–15.5)
WBC: 15.1 10*3/uL — ABNORMAL HIGH (ref 4.0–10.5)

## 2016-03-05 LAB — BASIC METABOLIC PANEL
Anion gap: 6 (ref 5–15)
BUN: 9 mg/dL (ref 6–20)
CHLORIDE: 103 mmol/L (ref 101–111)
CO2: 23 mmol/L (ref 22–32)
CREATININE: 0.85 mg/dL (ref 0.61–1.24)
Calcium: 7.7 mg/dL — ABNORMAL LOW (ref 8.9–10.3)
Glucose, Bld: 148 mg/dL — ABNORMAL HIGH (ref 65–99)
POTASSIUM: 3.8 mmol/L (ref 3.5–5.1)
SODIUM: 132 mmol/L — AB (ref 135–145)

## 2016-03-05 LAB — GLUCOSE, CAPILLARY
GLUCOSE-CAPILLARY: 139 mg/dL — AB (ref 65–99)
GLUCOSE-CAPILLARY: 206 mg/dL — AB (ref 65–99)
GLUCOSE-CAPILLARY: 175 mg/dL — AB (ref 65–99)
GLUCOSE-CAPILLARY: 140 mg/dL — AB (ref 65–99)

## 2016-03-05 LAB — HEPARIN LEVEL (UNFRACTIONATED): HEPARIN UNFRACTIONATED: 0.1 [IU]/mL — AB (ref 0.30–0.70)

## 2016-03-05 MED ORDER — HEPARIN (PORCINE) IN NACL 100-0.45 UNIT/ML-% IJ SOLN
1300.0000 [IU]/h | INTRAMUSCULAR | Status: DC
  Administered 2016-03-05: 950 [IU]/h via INTRAVENOUS
  Administered 2016-03-07 – 2016-03-09 (×3): 1300 [IU]/h via INTRAVENOUS
  Filled 2016-03-05 (×6): qty 250

## 2016-03-05 NOTE — Progress Notes (Signed)
ANTICOAGULATION CONSULT NOTE  Pharmacy Consult for Heparin  Indication:  LV thrombus and R femoral embolism s/p embolectomy  No Known Allergies  Patient Measurements: Height: 5\' 8"  (172.7 cm) Weight: 225 lb 5 oz (102.2 kg) IBW/kg (Calculated) : 68.4  Heparin dosing wt: 87 kg   Labs:  Recent Labs  03/03/16 0526 03/03/16 1650 03/04/16 0318 03/05/16 0458  HGB 5.0* 10.1* 9.0* 9.7*  HCT 15.5* 30.1* 26.2* 29.3*  PLT 226  --  227 273  CREATININE  --  0.83 0.74 0.85    Estimated Creatinine Clearance: 112.4 mL/min (by C-G formula based on Cr of 0.85).  Medication Infusion: . nitroGLYCERIN 10 mcg/min (03/04/16 2000)   Assessment: 55 yo male with LV thrombus and R femoral embolism s/p embolectomy and hematoma evacuation. Pt is also s/p closure of lateral fasciotomy and VAC placement to medial fasciotomy. Heparin was stopped on 7/5 after patient had oozing from leg wound, will be resumed today. Pt has required 10 units of prbc since admission.  hgb 5 > 9.7, plts wnl.    Goal of Therapy:  Heparin level 0.3-0.5 units/ml  Monitor platelets by anticoagulation protocol: Yes    Plan:  -Resume heparin at 950 units/hr -Daily HL, CBC -Check level this afternoon -F/u plan for PO anticoagulation     Agapito Games, PharmD, BCPS Clinical Pharmacist 03/05/2016 10:59 AM

## 2016-03-05 NOTE — Progress Notes (Signed)
Troy Yu. 02/28/1961 [id number]  Brief hx: 55 y.o. male who was in his usual state of health until about 2 weeks ago when he had substernal chest pain that lasted about 48 hours.The pain went away after he vomited, and he attributed his symptoms to a prolonged religious fast. He then come to the [facility] ED on 02/21/16 with severe RLE pain associated with paresthesias. Upon presentation, he was found to have an abnormal EKG and elevated troponin. Cardiology was subsequently called to evaluate the patient, and he was felt to have suffered a subacute anterior MI. Stat echo revealed LV thrombus, CTA showed right femoral embolism. Pt underwent emergent right femoral embolectomy and right calf carmpartment fasciotomies x 4 on 02/21/16. He was taken back to surgery on 626/17 for evacuation of hematoma and VAC dressing r/t excessive bleeding in calf.   I was called by RN tonight as pt complaining of RUQ pain, worse when taking deep breaths.   Subjective: Pt reports RUQ pain worse with deep breathing and movement. He denies any chest pain or sob. He denies any nausea. States he has not had a bowel movement in several days.    Objective General: awake, alert lying supine in bed. Appears uncomfortable but in NAD CV: S1S2 RRR, no m/r/g Resp: decreased effort but CTAB, no w/r/c, no increased wob GI: abdomen obese, soft, BS+. No rebound or guarding. Pain with palpation RUQ Psych: AOx3  CBC Latest Ref Rng 03/05/2016 03/05/2016 03/04/2016  WBC 4.0 - 10.5 K/uL 12.3(H) 15.1(H) 11.9(H)  Hemoglobin 13.0 - 17.0 g/dL 8.1(L) 5.7(W) 6.2(M)  Hematocrit 39.0 - 52.0 % 29.6(L) 29.3(L) 26.2(L)  Platelets 150 - 400 K/uL 345 273 227    CMP Latest Ref Rng 03/05/2016 03/05/2016 03/04/2016  Glucose 65 - 99 mg/dL 355(H) 741(U) 384(T)  BUN 6 - 20 mg/dL 11 9 7   Creatinine 0.61 - 1.24 mg/dL 3.64 6.80 3.21  Sodium 135 - 145 mmol/L 132(L) 132(L) 128(L)  Potassium 3.5 - 5.1 mmol/L 4.9 3.8 3.7  Chloride 101 - 111 mmol/L 103 103 99(L)   CO2 22 - 32 mmol/L 25 23 23   Calcium 8.9 - 10.3 mg/dL 7.3(L) 7.7(L) 7.2(L)  Total Protein 6.5 - 8.1 g/dL 5.2(L) - -  Total Bilirubin 0.3 - 1.2 mg/dL 2.2(Q) - -  Alkaline Phos 38 - 126 U/L 61 - -  AST 15 - 41 U/L 66(H) - -  ALT 17 - 63 U/L 16(L) - -   Assessment/plan  RUQ pain Unclear etiology. Pt much more comfortable now with repositioning and IV pain meds. Will start with KUB, check CBC, CMET, lipase Determine further w/u based on findings.   Cordelia Pen, NP-C [facility]  pgr [phone]

## 2016-03-05 NOTE — Progress Notes (Signed)
Subjective: Interval History: none.. Just had a dressing change 45 minutes ago. A nurse reports no further bleeding.  Objective: Vital signs in last 24 hours: Temp:  [98.1 F (36.7 C)-102.6 F (39.2 C)] 100.1 F (37.8 C) (07/08 0807) Pulse Rate:  [70-94] 84 (07/08 0807) Resp:  [15-25] 21 (07/08 0807) BP: (93-114)/(60-69) 95/62 mmHg (07/08 0807) SpO2:  [97 %-100 %] 98 % (07/08 0807)  Intake/Output from previous day: 07/07 0701 - 07/08 0700 In: 722 [P.O.:240; I.V.:482] Out: 3550 [Urine:3550] Intake/Output this shift: Total I/O In: 26 [I.V.:26] Out: -   3+ dorsalis pedis pulse on the right. Dressing intact with no bleeding. Right groin incision completely healed.  Lab Results:  Recent Labs  03/04/16 0318 03/05/16 0458  WBC 11.9* 15.1*  HGB 9.0* 9.7*  HCT 26.2* 29.3*  PLT 227 273   BMET  Recent Labs  03/04/16 0318 03/05/16 0458  NA 128* 132*  K 3.7 3.8  CL 99* 103  CO2 23 23  GLUCOSE 151* 148*  BUN 7 9  CREATININE 0.74 0.85  CALCIUM 7.2* 7.7*    Studies/Results: Dg Chest 2 View  02/21/2016  CLINICAL DATA:  Left lower extremity numbness and pain. EXAM: CHEST  2 VIEW COMPARISON:  None. FINDINGS: Normal heart size. Low lung volumes. Top-normal heart size. Mediastinal contour is within normal limits. No pneumothorax. No pleural effusion. Lungs appear clear, with no acute consolidative airspace disease and no pulmonary edema. IMPRESSION: Low lung volumes.  No active disease in the chest. Electronically Signed   By: Delbert Phenix M.D.   On: 02/21/2016 11:27   Ct Angio Ao+bifem W &/or Wo Contrast  02/21/2016  CLINICAL DATA:  Right lower extremity pain with diminished pulses and numbness. EXAM: CT ANGIOGRAPHY OF ABDOMINAL AORTA WITH ILIOFEMORAL RUNOFF TECHNIQUE: Multidetector CT imaging of the abdomen, pelvis and lower extremities was performed using the standard protocol during bolus administration of intravenous contrast. Multiplanar CT image reconstructions and MIPs  were obtained to evaluate the vascular anatomy. CONTRAST:  100 mL Isovue 370 IV COMPARISON:  None. FINDINGS: Aorta: Normally patent abdominal aorta without evidence of atherosclerosis, aneurysm or dissection. Visceral arteries are widely patent including the celiac axis, superior mesenteric artery, inferior mesenteric artery and bilateral single renal arteries. No embolic occlusions identified in the abdomen or pelvis. Right Lower Extremity: Widely patent right common, external and internal iliac arteries. The common femoral artery is normally patent up to its bifurcation. At the femoral bifurcation, there is nearly occlusive thrombus identified extending into both proximal profunda femoral and superficial femoral artery trunks. Contrast flow is seen beyond the thrombus and into normal distal profunda femoral branches. The SFA is open, but demonstrates some anterior mural thrombus. In the absence of significant atherosclerosis, this may represent some adherent thrombus from recent/prior embolic event. Anterior mural thrombus continues into the popliteal artery above the knee. Below the knee, there is only faint opacification of the distal popliteal artery and no visualized opacification of the tibial arteries. This is likely due to slow flow. However, component of thrombus in the tibial arteries cannot be entirely excluded. Left Lower Extremity: Left-sided arterial supply is completely normal including the common femoral artery, profunda femoral artery, superficial femoral artery, popliteal artery and tibial arteries. Or there is some narrowing of the popliteal artery just above the knee joint of approximately 40-50% is associated with a slightly tortuous/ course. No plaque is identified at this level. A component of incidental popliteal entrapment cannot be excluded. Review of the MIP images confirms  the above findings. Lower chest:  Bibasilar atelectasis.  No pleural effusions. Hepatobiliary: Unremarkable arterial  phase imaging of the liver and gallbladder. Pancreas: Unremarkable appearance. Spleen: Normal appearance of spleen. Adrenals/Urinary Tract: Normal adrenal glands and kidneys. No renal infarcts identified. Stomach/Bowel: Normal appearance without evidence to suggest mesenteric ischemia or inflammation. No obstruction. Normal appendix. No free air, free fluid or abscess. Lymphatic: No enlarged lymph nodes identified. Reproductive: Negative. Other: No hernias. Musculoskeletal: Bony structures are normal. IMPRESSION: 1. Embolic thrombus at the bifurcation of the right common femoral artery with thrombus extending into proximal profunda femoral and superficial femoral arteries. Thrombus is nearly occlusive but contrast is seen to flow around the thrombus into the distal vessels. There is also a component of probable anterior mural thrombus in the SFA and popliteal arteries which may be consistent with additional adherent thrombus. Tibial arteries are not adequately opacified on the right, most likely secondary to slow flow. Distal thrombus cannot be excluded. Vascular Surgical consultation is recommended. 2. Incidental finding of a mildly tortuous course of the popliteal artery on the left associated with some narrowing just above the knee joint. This narrowing does not appear atherosclerotic and there may be an incidental component of popliteal entrapment syndrome on the left. These results were called by telephone at the time of interpretation on 02/21/2016 at 2:55 pm to Dr. Zadie Rhine , who verbally acknowledged these results. Electronically Signed   By: Irish Lack M.D.   On: 02/21/2016 14:56   Dg Chest Port 1 View  02/26/2016  CLINICAL DATA:  Fever, onset tonight. EXAM: PORTABLE CHEST 1 VIEW COMPARISON:  None. FINDINGS: A single AP portable view of the chest demonstrates no focal airspace consolidation or alveolar edema. The lungs are grossly clear. There is no large effusion or pneumothorax. Cardiac and  mediastinal contours appear unremarkable. IMPRESSION: No acute findings Electronically Signed   By: Ellery Plunk M.D.   On: 02/26/2016 02:15   Anti-infectives: Anti-infectives    Start     Dose/Rate Route Frequency Ordered Stop   02/26/16 0930  [MAR Hold]  cefUROXime (ZINACEF) 1.5 g in dextrose 5 % 50 mL IVPB     (MAR Hold since 02/26/16 0928)   1.5 g 100 mL/hr over 30 Minutes Intravenous To ShortStay Surgical 02/25/16 1055 02/26/16 1120   02/22/16 0800  cefUROXime (ZINACEF) 1.5 g in dextrose 5 % 50 mL IVPB     1.5 g 100 mL/hr over 30 Minutes Intravenous Every 12 hours 02/21/16 2115 02/22/16 2140   02/22/16 0600  cefUROXime (ZINACEF) injection 1.5 g     1.5 g Intramuscular On call to O.R. 02/21/16 1523 02/21/16 1713      Assessment/Plan: s/p Procedure(s): RIGHT LATERAL LOWER LEG FASCIOTOMY CLOSURE (Right) APPLICATION OF WOUND VAC, RIGHT MEDIAL LOWER LEG FASCIOTOMY SITE (Right) Patient continues to express concerns regarding the specific details of wound care. Again explained that there is no exact correct way to do this. He currently is undergoing hydrogel with the saline over this. Splane the does not appear to have a great deal of debris so there is no need to remove his dressing wet and it can be moistened prior to removing for ease of discomfort. Also explained that he will continue to have less discomfort over time as this becomes a more mature granulating wound. I have asked his nurse to the delay dressing change tomorrow until I see him on rounds so I can see his wound is well. Should be able to continue heparin at  the lower therapeutic range and thus he continues to have any new bleeding from his fasciotomy site.   LOS: 13 days   EarlyTawanna Cooler 03/05/2016, 10:28 AM

## 2016-03-05 NOTE — Progress Notes (Signed)
PROGRESS NOTE    Troy Yu  ZOX:[id number] DOB: Dec 11, 1960 DOA: 02/21/2016 PCP: No primary care provider on file.   Brief Narrative:  Troy Yu is an 55 y.o. male who was in his usual state of health until about 2 weeks ago when he had substernal chest pain that lasted about 48 hours.The pain went away after he vomited, and he attributed his symptoms to a prolonged religious fast. He then come to the [facility] ED on 02/21/16 with severe RLE pain associated with paresthesias. Upon presentation, he was found to have an abnormal EKG and elevated troponin. Cardiology was subsequently called to evaluate the patient, and he was felt to have suffered a subacute anterior MI. STAT echo was ordered and EDP advised to order an arterial duplex with recommendation to consult vascular surgery. IV heparin ordered and the patient was transferred to [facility] for further evaluation by vascular surgery, as it was felt that he had a right femoral embolism with concern for threatened limb. Dr Imogene Burn subsequently performed a right femoral embolectomy and right calf compartment fasciotomies x 4 . At 4:16 a.m. On 02/22/16, the patient complained of loss of sensation to RLE, and the patient reports he was in severe pain. He was given pain medication which did not alleviate his symptoms. He was re-evaluated by Dr. Imogene Burn at 7:33 a.m. Who removed the ACE wrap from his RLE which relieved the pain, and the patient was felt to have excessive bleeding in his calf related to full anti-coagulation. He was taken back to surgery for evacuation of hematoma and VAC dressings. Cardiologist continues to follow for management of his subacute MI. Patient underwent closure of lateral fasciotomy. Plastic surgery consulted and recommendations given..  7/6 patient's hemoglobin dropped to 5 from continuous oozing from the leg wound, and his heparin was stopped on 7/5.  Dr Early recommended surgical evaluation by plastic surgery, but patient does not  want any further surgery at this time and wanted to address the bleeding from the leg wound with local wound care. Explained to him that he needs IV heparin for his LV thrombus and he is at risk from cardiac complications without IV heparin. And at the same time  he continues to bleed on IV heparin, he needs the  Leg wound to be evaluated by the plastic surgery for possible skin graft surgery.   7/7 pt 's hemoglobin improved to 10 , dropped back to 9. Today he is refusing to have his dressing changed despite the surgeon's recommendation.  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  -Case taken over by Dr. Arbie Cookey after patient requested a second opinion.  -Lipids well controlled, cholesterol 116, LDL 60.  -s/p closure of lateral fasciotomy site and placement of VAC on medial fasciotomy site -Plastic surgery consulted by vascular for possible STSG to medial fasciotomy site, but patient refusing any further surgical procedures.  -Wound vac removed (medial) and wet-to-dry dressings - 7/4 leg wound continues to ooze blood and dressing changed several times and Dr Early contacted and IV heparin was discontinued.  - 7/6 his hemoglobin dropped to 5 and he received 3 units  of prbc transfusion. IV heparin still on hold. Repeat hemoglobin improved .  - 7/8 heparin restarted as his hemoglobin is stable and oozing from the wound has decreased.   Symptomatic anemia/ Postoperative anemia/ Anemia secondary to blood loss -During hospitalization, hemoglobin has dropped to as low as 4.7 , continues to  drop , requiring many prbc transfusions.  -Has received a total of 10 u PRBCs this admission -Hemoglobin 5 on 7/6 and he received 3 units of prbc transfusion and the repeat hemoglobin is 10-9.  -Continue to monitor CBC intermittently.  - anemia from blood loss from continuous oozing from the leg wound. IV heparin was held on 7/5.  - transfuse to keep hemoglobin greater than 8.  - restarted IV heparin on 7/8   Fever -Resolved, Continue tylenol PRN -Continue to monitor closely  Syncope and orthostasis -Likely secondary to blood loss -Continue to monitor closely  Anterior subendocardial MI (HCC)/Abnormal EKG/Elevated troponin -On therapeutic dose heparin, which was held on 7/4 for continuous oozing from the leg wound.  -Cardiology consulted and appreciated -Continue Inderal -ACE-I to be started when BP able to tolerate -Will eventually need cardiac cath  Newly diagnosed diabetes mellitus/Hyperglycemia -Hemoglobin A1c 9.2.  -Continue lantus, ISS and CBG monitoring.  -Diabetes coordinator consulted - CBG (last 3)   Recent Labs  03/04/16 1605 03/04/16 2149 03/05/16 0806  GLUCAP 130* 148* 139*      Hyponatremia - much improved.  Get serum osmolarity is 273 , urine osmo pending, TSH and am cortisol normal limits. .  -Continue to monitor BMP intermittently.  Constipation -continue colace. Improved.  -Fleet and dulcolax supp PRN  DVT Prophylaxis  IV heparin to be restarted. .   Code Status: Full  Family Communication: Son at bedside  Disposition Plan:  Pending  further recommendations from vascular surgery and cardiology. . Continue to monitor hemoglobin.  Consultants Cardiology  Vascular Surgery Plastic surgery  Procedures  Right femoral embolectomy Right calf four compartment fasciotomies  Evacuation of hematoma from bilateral right calf fasciotomies Placement of negative pressure dressings x2 Closure of lateral fasciotomy site and placement of VAC on  medial fasciotomy site  Antibiotics   Anti-infectives    Start     Dose/Rate Route Frequency Ordered Stop   02/26/16 0930  [MAR Hold]  cefUROXime (ZINACEF) 1.5 g in dextrose 5 % 50 mL IVPB     (MAR Hold since 02/26/16 0928)   1.5 g 100 mL/hr over 30 Minutes Intravenous To ShortStay Surgical 02/25/16 1055 02/26/16 1120   02/22/16 0800  cefUROXime (ZINACEF) 1.5 g in dextrose 5 % 50 mL IVPB     1.5 g 100 mL/hr over 30 Minutes Intravenous Every 12 hours 02/21/16 2115 02/22/16 2140   02/22/16 0600  cefUROXime (ZINACEF) injection 1.5 g     1.5 g Intramuscular On call to O.R. 02/21/16 1523 02/21/16 1713      Subjective:   Ewin Luba seen and examined today, no new complaints.   Objective:   Filed Vitals:   03/05/16 0000 03/05/16 0319 03/05/16 0400 03/05/16 0807  BP:  109/69  95/62  Pulse: 82 70 74 84  Temp:  98.1 F (36.7 C)  100.1 F (37.8 C)  TempSrc:  Oral  Oral  Resp: 20 16 18 21   Height:      Weight:      SpO2: 97% 100% 97% 98%    Intake/Output Summary (Last 24 hours) at 03/05/16 1020 Last data filed at 03/05/16 0900  Gross per 24 hour  Intake    539 ml  Output   2925 ml  Net  -2386 ml   Filed Weights   02/29/16 0303 03/03/16 0416 03/04/16 0500  Weight: 103.4 kg (227 lb 15.3 oz) 101.742 kg (224 lb 4.8 oz) 102.2 kg (225 lb 5 oz)    Exam  General: Well developed, well nourished, no apparent distress  HEENT: NCAT, mucous membranes moist.   Cardiovascular: S1 S2 auscultated, no murmurs, RRR  Respiratory: Clear to auscultation bilaterally with equal chest rise.  Abdomen: Soft, nontender, nondistended, + bowel sounds  Extremities: warm dry without cyanosis clubbing or edema. RLE wrapped, improved oozing from the leg wound.   Neuro: AAOx3, frustrated , nonfocal  Psych: angry and frustrated.   Data Reviewed: I have personally reviewed following labs and imaging studies  CBC:  Recent Labs Lab 02/29/16 0509  03/02/16 0229 03/02/16 0817  03/03/16 0526 03/03/16 1650 03/04/16 0318 03/05/16 0458  WBC 9.7  --  11.2*  --  9.4  --  11.9* 15.1*  HGB 7.7*  < > 7.0* 7.7* 5.0* 10.1* 9.0* 9.7*  HCT 22.9*  < > 20.3* 22.7* 15.5* 30.1* 26.2* 29.3*  MCV 89.1  --  91.0  --  93.9  --  88.5 91.6  PLT 239  --  212  --  226  --  227 273  < > = values in this interval not displayed. Basic Metabolic Panel:  Recent Labs Lab 03/01/16 0904 03/02/16 0229 03/03/16 1650 03/04/16 0318 03/05/16 0458  NA 126* 129* 130* 128* 132*  K 4.5 3.8 3.8 3.7 3.8  CL 96* 97* 99* 99* 103  CO2 24 25 24 23 23   GLUCOSE 194* 212* 140* 151* 148*  BUN 11 9 6 7 9   CREATININE 0.90 0.78 0.83 0.74 0.85  CALCIUM 7.3* 7.3* 7.6* 7.2* 7.7*   GFR: Estimated Creatinine Clearance: 112.4 mL/min (by C-G formula based on Cr of 0.85). Liver Function Tests: No results for input(s): AST, ALT, ALKPHOS, BILITOT, PROT, ALBUMIN in the last 168 hours. No results for input(s): LIPASE, AMYLASE in the last 168 hours. No results for input(s): AMMONIA in the last 168 hours. Coagulation Profile: No results for input(s): INR, PROTIME in the last 168 hours. Cardiac Enzymes: No results for input(s): CKTOTAL, CKMB, CKMBINDEX, TROPONINI in the last 168 hours. BNP (last 3 results) No results for input(s): PROBNP in the last 8760 hours. HbA1C: No results for input(s): HGBA1C in the last 72 hours. CBG:  Recent Labs Lab 03/04/16 0810 03/04/16 1217 03/04/16 1605 03/04/16 2149 03/05/16 0806  GLUCAP 138* 93 130* 148* 139*   Lipid Profile: No results for input(s): CHOL, HDL, LDLCALC, TRIG, CHOLHDL, LDLDIRECT in the last 72 hours. Thyroid Function Tests:  Recent Labs  03/03/16 1650  TSH 3.200   Anemia Panel: No results for input(s): VITAMINB12, FOLATE, FERRITIN, TIBC, IRON, RETICCTPCT in the last 72 hours. Urine analysis:    Component Value Date/Time   COLORURINE YELLOW 02/26/2016 0229   APPEARANCEUR CLEAR 02/26/2016 0229   LABSPEC 1.014 02/26/2016 0229   PHURINE 6.0  02/26/2016 0229   GLUCOSEU 500* 02/26/2016 0229   HGBUR NEGATIVE 02/26/2016 0229   BILIRUBINUR NEGATIVE 02/26/2016 0229   KETONESUR NEGATIVE 02/26/2016 0229   PROTEINUR NEGATIVE 02/26/2016 0229   NITRITE NEGATIVE 02/26/2016 0229   LEUKOCYTESUR NEGATIVE 02/26/2016 0229   Sepsis Labs: @LABRCNTIP (procalcitonin:4,lacticidven:4)  ) Recent Results (from the past 240 hour(s))  Culture, blood (routine x 2)     Status: None   Collection Time: 02/26/16  8:12 AM  Result Value Ref Range Status   Specimen Description BLOOD BLOOD LEFT HAND  Final   Special Requests IN PEDIATRIC BOTTLE 2.5CC  Final   Culture NO GROWTH 5 DAYS  Final   Report Status 03/02/2016 FINAL  Final  Culture, blood (routine x 2)     Status: None   Collection Time: 02/26/16  8:26 AM  Result Value Ref Range Status   Specimen Description BLOOD LEFT ANTECUBITAL  Final   Special Requests IN PEDIATRIC BOTTLE 1CC  Final   Culture NO GROWTH 5 DAYS  Final   Report Status 03/02/2016 FINAL  Final      Radiology Studies: No results found.   Scheduled Meds: . aspirin  325 mg Oral Daily  . atorvastatin  80 mg Oral q1800  . docusate sodium  100 mg Oral Daily  . insulin aspart  0-15 Units Subcutaneous TID WC  . insulin aspart  0-5 Units Subcutaneous QHS  . insulin aspart  6 Units Subcutaneous TID WC  . insulin glargine  20 Units Subcutaneous QHS  . pantoprazole  40 mg Oral Daily  . propranolol  10 mg Oral TID   Continuous Infusions: . nitroGLYCERIN 10 mcg/min (03/04/16 2000)     LOS: 13 days   Time Spent in minutes   30 minutes  Arilla Hice D.O. on 03/05/2016 at 10:20 AM  [phone]  After 7pm go to [email or web address] - password [email or web address]  And look for the night coverage person covering for me after hours  [facility] Office  [phone]

## 2016-03-05 NOTE — Progress Notes (Signed)
ANTICOAGULATION CONSULT NOTE  Pharmacy Consult for Heparin  Indication:  LV thrombus and R femoral embolism s/p embolectomy  No Known Allergies  Patient Measurements: Height: 5\' 8"  (172.7 cm) Weight: 225 lb 5 oz (102.2 kg) IBW/kg (Calculated) : 68.4  Heparin dosing wt: 87 kg   Labs:  Recent Labs  03/03/16 0526 03/03/16 1650 03/04/16 0318 03/05/16 0458 03/05/16 1802  HGB 5.0* 10.1* 9.0* 9.7*  --   HCT 15.5* 30.1* 26.2* 29.3*  --   PLT 226  --  227 273  --   HEPARINUNFRC  --   --   --   --  0.10*  CREATININE  --  0.83 0.74 0.85  --     Estimated Creatinine Clearance: 112.4 mL/min (by C-G formula based on Cr of 0.85).  Medication Infusion: . heparin 950 Units/hr (03/05/16 1148)  . nitroGLYCERIN 10 mcg/min (03/04/16 2000)   Assessment: 55 yo male with LV thrombus and R femoral embolism s/p embolectomy and hematoma evacuation. Pt is also s/p closure of lateral fasciotomy and VAC placement to medial fasciotomy. Heparin was stopped on 7/5 after patient had oozing from leg wound, will be resumed today. Pt has required 10 units of prbc since admission.  HL low 0.1 on 950 units/h (goal 0.3-0.5). Hg 9.7, plt wnl, no bleed/IV line issues per RN.  Goal of Therapy:  Heparin level 0.3-0.5 units/ml  Monitor platelets by anticoagulation protocol: Yes    Plan:  -Increase heparin to 1100 units/hr -6h HL, Daily HL, CBC -Monitor s/sx bleeding -F/u plan for PO anticoagulation     Babs Bertin, PharmD, [facility] Clinical Pharmacist Pager [phone] 03/05/2016 6:58 PM

## 2016-03-05 NOTE — Plan of Care (Signed)
Problem: Skin Integrity: Goal: Demonstration of wound healing without infection will improve Outcome: Progressing Lateral fasciotomy site closed with sutures and approximated without s/s of infection. Medial fasciotomy site being treated and cared for with daily dressing changes with hydrogel and moist gauze. Minimal serosanguinous drainage present but no further bleeding or hematomas. Dr. Arbie Cookey will be at bedside in the morning for daily dressing change to assess progression of this site.

## 2016-03-06 ENCOUNTER — Inpatient Hospital Stay (HOSPITAL_COMMUNITY): Payer: Self-pay

## 2016-03-06 ENCOUNTER — Encounter (HOSPITAL_COMMUNITY): Payer: Self-pay

## 2016-03-06 LAB — COMPREHENSIVE METABOLIC PANEL
ALK PHOS: 61 U/L (ref 38–126)
ALT: 16 U/L — ABNORMAL LOW (ref 17–63)
ANION GAP: 4 — AB (ref 5–15)
AST: 66 U/L — ABNORMAL HIGH (ref 15–41)
Albumin: 1.6 g/dL — ABNORMAL LOW (ref 3.5–5.0)
BILIRUBIN TOTAL: 1.9 mg/dL — AB (ref 0.3–1.2)
BUN: 11 mg/dL (ref 6–20)
CALCIUM: 7.3 mg/dL — AB (ref 8.9–10.3)
CO2: 25 mmol/L (ref 22–32)
Chloride: 103 mmol/L (ref 101–111)
Creatinine, Ser: 0.88 mg/dL (ref 0.61–1.24)
GFR calc non Af Amer: >60 mL/min (ref >60)
Glucose, Bld: 198 mg/dL — ABNORMAL HIGH (ref 65–99)
Potassium: 4.9 mmol/L (ref 3.5–5.1)
SODIUM: 132 mmol/L — AB (ref 135–145)
TOTAL PROTEIN: 5.2 g/dL — AB (ref 6.5–8.1)

## 2016-03-06 LAB — BASIC METABOLIC PANEL
Anion gap: 7 (ref 5–15)
BUN: 11 mg/dL (ref 6–20)
CHLORIDE: 101 mmol/L (ref 101–111)
CO2: 22 mmol/L (ref 22–32)
CREATININE: 0.85 mg/dL (ref 0.61–1.24)
Calcium: 7.3 mg/dL — ABNORMAL LOW (ref 8.9–10.3)
GFR calc Af Amer: >60 mL/min (ref >60)
GFR calc non Af Amer: >60 mL/min (ref >60)
GLUCOSE: 197 mg/dL — AB (ref 65–99)
Potassium: 3.7 mmol/L (ref 3.5–5.1)
Sodium: 130 mmol/L — ABNORMAL LOW (ref 135–145)

## 2016-03-06 LAB — HEPARIN LEVEL (UNFRACTIONATED)
Heparin Unfractionated: 0.21 IU/mL — ABNORMAL LOW (ref 0.30–0.70)
Heparin Unfractionated: 0.35 IU/mL (ref 0.30–0.70)

## 2016-03-06 LAB — URINALYSIS, ROUTINE W REFLEX MICROSCOPIC
Bilirubin Urine: NEGATIVE
Glucose, UA: NEGATIVE mg/dL
HGB URINE DIPSTICK: NEGATIVE
Ketones, ur: NEGATIVE mg/dL
Leukocytes, UA: NEGATIVE
Nitrite: NEGATIVE
Protein, ur: NEGATIVE mg/dL
SPECIFIC GRAVITY, URINE: 1.008 (ref 1.005–1.030)
pH: 6.5 (ref 5.0–8.0)

## 2016-03-06 LAB — CBC
HEMATOCRIT: 27.8 % — AB (ref 39.0–52.0)
HEMATOCRIT: 29.6 % — AB (ref 39.0–52.0)
HEMOGLOBIN: 8.9 g/dL — AB (ref 13.0–17.0)
HEMOGLOBIN: 9.4 g/dL — AB (ref 13.0–17.0)
MCH: 29.6 pg (ref 26.0–34.0)
MCH: 29.2 pg (ref 26.0–34.0)
MCHC: 32 g/dL (ref 30.0–36.0)
MCHC: 31.8 g/dL (ref 30.0–36.0)
MCV: 91.9 fL (ref 78.0–100.0)
MCV: 92.4 fL (ref 78.0–100.0)
Platelets: 345 10*3/uL (ref 150–400)
Platelets: 345 10*3/uL (ref 150–400)
RBC: 3.01 MIL/uL — ABNORMAL LOW (ref 4.22–5.81)
RBC: 3.22 MIL/uL — ABNORMAL LOW (ref 4.22–5.81)
RDW: 15.7 % — ABNORMAL HIGH (ref 11.5–15.5)
RDW: 15.8 % — ABNORMAL HIGH (ref 11.5–15.5)
WBC: 12.3 10*3/uL — ABNORMAL HIGH (ref 4.0–10.5)
WBC: 15.2 10*3/uL — ABNORMAL HIGH (ref 4.0–10.5)

## 2016-03-06 LAB — GLUCOSE, CAPILLARY
GLUCOSE-CAPILLARY: 140 mg/dL — AB (ref 65–99)
GLUCOSE-CAPILLARY: 119 mg/dL — AB (ref 65–99)
Glucose-Capillary: 156 mg/dL — ABNORMAL HIGH (ref 65–99)
Glucose-Capillary: 125 mg/dL — ABNORMAL HIGH (ref 65–99)

## 2016-03-06 LAB — LIPASE, BLOOD: LIPASE: 58 U/L — AB (ref 11–51)

## 2016-03-06 LAB — OSMOLALITY, URINE: Osmolality, Ur: 201 mOsm/kg — ABNORMAL LOW (ref 300–900)

## 2016-03-06 MED ORDER — DIATRIZOATE MEGLUMINE & SODIUM 66-10 % PO SOLN
ORAL | Status: AC
  Administered 2016-03-06: 15:00:00
  Filled 2016-03-06: qty 30

## 2016-03-06 MED ORDER — PANTOPRAZOLE SODIUM 40 MG PO TBEC
40.0000 mg | DELAYED_RELEASE_TABLET | Freq: Two times a day (BID) | ORAL | Status: DC
  Administered 2016-03-06 – 2016-03-16 (×21): 40 mg via ORAL
  Filled 2016-03-06 (×20): qty 1

## 2016-03-06 MED ORDER — FLEET ENEMA 7-19 GM/118ML RE ENEM
1.0000 | ENEMA | Freq: Every day | RECTAL | Status: DC | PRN
  Filled 2016-03-06: qty 1

## 2016-03-06 MED ORDER — SIMETHICONE 80 MG PO CHEW
80.0000 mg | CHEWABLE_TABLET | Freq: Four times a day (QID) | ORAL | Status: DC | PRN
  Filled 2016-03-06: qty 1

## 2016-03-06 MED ORDER — IOPAMIDOL (ISOVUE-300) INJECTION 61%
INTRAVENOUS | Status: AC
  Administered 2016-03-06: 100 mL
  Filled 2016-03-06: qty 100

## 2016-03-06 MED ORDER — MAGNESIUM HYDROXIDE 400 MG/5ML PO SUSP: 30.0000 mL | Freq: Every day | ORAL | Status: DC | PRN

## 2016-03-06 NOTE — Progress Notes (Signed)
ANTICOAGULATION CONSULT NOTE  Pharmacy Consult for Heparin  Indication:  LV thrombus and R femoral embolism s/p embolectomy  No Known Allergies  Patient Measurements: Height: 5\' 8"  (172.7 cm) Weight: 220 lb 0.3 oz (99.8 kg) IBW/kg (Calculated) : 68.4  Heparin dosing wt: 87 kg  Labs:  Recent Labs  03/05/16 0458 03/05/16 1802 03/05/16 2345 03/06/16 0410 03/06/16 1249  HGB 9.7*  --  9.4* 8.9*  --   HCT 29.3*  --  29.6* 27.8*  --   PLT 273  --  345 345  --   HEPARINUNFRC  --  0.10*  --  0.21* 0.35  CREATININE 0.85  --  0.88 0.85  --    Estimated Creatinine Clearance: 111.2 mL/min (by C-G formula based on Cr of 0.85).  Assessment: 55 yo male with LV thrombus s/p R femoral embolectomy and subsequent hematoma evacuation, for heparin.  His heparin was turned off due to complications of bleeding but resumed on 7/8.  Today, his HL is 0.35 on IV heparin rate of 1300 units/hr.  His H/H has trended down again, will need to watch closely.    Goal of Therapy:  Heparin level 0.3-0.5 units/ml  Monitor platelets by anticoagulation protocol: Yes   Plan:  - Continue Heparin at 1300 units/hr - Check HL and CBC daily - Monitor closely for bleeding complications  Nadara Mustard, PharmD., MS Clinical Pharmacist Pager:  [phone] Thank you for allowing pharmacy to be part of this patients care team. 03/06/2016 3:09 PM

## 2016-03-06 NOTE — Progress Notes (Signed)
ANTICOAGULATION CONSULT NOTE  Pharmacy Consult for Heparin  Indication:  LV thrombus and R femoral embolism s/p embolectomy  No Known Allergies  Patient Measurements: Height: 5\' 8"  (172.7 cm) Weight: 225 lb 5 oz (102.2 kg) IBW/kg (Calculated) : 68.4  Heparin dosing wt: 87 kg   Labs:  Recent Labs  03/05/16 0458 03/05/16 1802 03/05/16 2345 03/06/16 0410  HGB 9.7*  --  9.4* 8.9*  HCT 29.3*  --  29.6* 27.8*  PLT 273  --  345 345  HEPARINUNFRC  --  0.10*  --  0.21*  CREATININE 0.85  --  0.88 0.85    Estimated Creatinine Clearance: 112.4 mL/min (by C-G formula based on Cr of 0.85).  Assessment: 55 yo male with LV thrombus s/p R femoral embolectomy and subsequent hematoma evacuation, for heparin.  No bleeding noted by RN  Goal of Therapy:  Heparin level 0.3-0.5 units/ml  Monitor platelets by anticoagulation protocol: Yes   Plan:  Increase Heparin  1300 units/hr Check heparin level in 6 hours.   Geannie Risen, PharmD, BCPS  03/06/2016 5:03 AM

## 2016-03-06 NOTE — Progress Notes (Signed)
Pt c/o sharp  RUQ abdominal pain 10/10 worse with deep breathing. V/S stable. NP Easterwood informed & came to examined pt. Abd. Xray done as ordered. Informed NP Easterwood of results.Stat CBC, CMET & lipase taken  as ordered. Dilaudid IV given as PRN dose. Repositioned & HOB elevated for comfort. Will continue to monitor pt.

## 2016-03-06 NOTE — Progress Notes (Addendum)
PROGRESS NOTE    Troy Yu  GNF:[id number] DOB: [birth year] DOA: 02/21/2016 PCP: No primary care provider on file.   Brief Narrative:  Troy Yu is an 55 y.o. male who was in his usual state of health until about 2 weeks ago when he had substernal chest pain that lasted about 48 hours.The pain went away after he vomited, and he attributed his symptoms to a prolonged religious fast. He then come to the [facility] ED on 02/21/16 with severe RLE pain associated with paresthesias. Upon presentation, he was found to have an abnormal EKG and elevated troponin. Cardiology was subsequently called to evaluate the patient, and he was felt to have suffered a subacute anterior MI. STAT echo was ordered and EDP advised to order an arterial duplex with recommendation to consult vascular surgery. IV heparin ordered and the patient was transferred to [facility] for further evaluation by vascular surgery, as it was felt that he had a right femoral embolism with concern for threatened limb. Dr Imogene Burn subsequently performed a right femoral embolectomy and right calf compartment fasciotomies x 4 . At 4:16 a.m. On 02/22/16, the patient complained of loss of sensation to RLE, and the patient reports he was in severe pain. He was given pain medication which did not alleviate his symptoms. He was re-evaluated by Dr. Imogene Burn at 7:33 a.m. Who removed the ACE wrap from his RLE which relieved the pain, and the patient was felt to have excessive bleeding in his calf related to full anti-coagulation. He was taken back to surgery for evacuation of hematoma and VAC dressings. Cardiologist continues to follow for management of his subacute MI. Patient underwent closure of lateral fasciotomy. Plastic surgery consulted and recommendations given..  7/6 patient's hemoglobin dropped to 5 from continuous oozing from the leg wound, and his heparin was stopped on 7/5.  Dr Early recommended surgical evaluation by plastic surgery, but patient does not  want any further surgery at this time and wanted to address the bleeding from the leg wound with local wound care. Explained to him that he needs IV heparin for his LV thrombus and he is at risk from cardiac complications without IV heparin. And at the same time  he continues to bleed on IV heparin, he needs the  Leg wound to be evaluated by the plastic surgery for possible skin graft surgery.   7/7 pt 's hemoglobin improved to 10 , dropped back to 9. Today he is refusing to have his dressing changed despite the surgeon's recommendation.  7/8 patient restarted on IV heparin, as oozing from the leg has improved and hemoglobin stable at 9.7 7/9 abdominal distention, and abd film shows gas filled small bowel loops, recommended to get OOB and ambulate.  Fevers on and off over the 24 to 36 hrs, blood culture, CXR, UA, and CT abdomen ordered .   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  -Case taken over by Dr. Arbie Cookey after patient requested a second opinion.  -Lipids well controlled, cholesterol 116, LDL 60.  -s/p closure of lateral fasciotomy site and placement of VAC on medial fasciotomy site -Plastic surgery consulted by vascular for possible STSG to medial fasciotomy site, but patient refusing any further surgical procedures.  -Wound vac removed (  medial) and wet-to-dry dressings - 7/4 leg wound continues to ooze blood and dressing changed several times and Dr Early contacted and IV heparin was discontinued.  - 7/6 his hemoglobin dropped to 5 and he received 3 units of prbc transfusion. IV heparin still on hold. Repeat hemoglobin improved .  - 7/8 heparin restarted as his hemoglobin is  stable and oozing from the wound has decreased.   Symptomatic anemia/ Postoperative anemia/ Anemia secondary to blood loss -During hospitalization, hemoglobin has dropped to as low as 4.7 , continues to drop , requiring many prbc transfusions.  -Has received a total of 10 u PRBCs this admission -Hemoglobin 5 on 7/6 and he received 3 units of prbc transfusion and the repeat hemoglobin is 10-9.  -Continue to monitor CBC intermittently.  - anemia from blood loss from continuous oozing from the leg wound. IV heparin was held on 7/5.  - transfuse to keep hemoglobin greater than 8.  - restarted IV heparin on 7/8  Repeat hemoglobin i s 8.9    Syncope and orthostasis -Likely secondary to blood loss -Continue to monitor closely  Anterior subendocardial MI (HCC)/Abnormal EKG/Elevated troponin -On therapeutic dose heparin, which was held on 7/4 for continuous oozing from the leg wound. Restarted IV heparin on 7/8 as his oozing from the leg wound decreased and his hemoglobin is stable.  -Cardiology consulted and appreciated -Continue Inderal -ACE-I to be started when BP able to tolerate -Will eventually need cardiac cath  Newly diagnosed diabetes mellitus/Hyperglycemia -Hemoglobin A1c 9.2.  -Continue lantus, ISS and CBG monitoring.  -Diabetes coordinator consulted - CBG (last 3)   Recent Labs  03/05/16 2111 03/06/16 0743 03/06/16 1132  GLUCAP 140* 140* 156*      Hyponatremia - much improved.  Get serum osmolarity is 273 , urine osmo pending, TSH and am cortisol normal limits. .  -Continue to monitor BMP intermittently.  Abdominal distention, and constipation: Overnight pt reports abdominal pain and feeling bloated. AXR shows air filled small bowels, probably from small bowel dysmotility. Recommended he get out of bed and ambulate which will help relieve the gas, and help with constipation. He is adamant about getting out of bed, does not want to participate or attempt to get oob.   Requesting medications , but refusing to take them.  Discussed with the patient, that he has to get OOB,. Added on simethicone to help, in addition to the laxatives.   Fevers on and OFF over the last 36 hours associated with chills Initially thought to be from blood transfusions, but hisT max OF 102 yesterday. Septic work up including   Blood cultures ordered, CXR, UA, and CT abd and pelvis without contrast ordered for further evaluation.  leukocytosis     DVT Prophylaxis  IV heparin to be restarted. .   Code Status: Full  Family Communication: Son at bedside, discussed the plan of care with the patient and son.   Disposition Plan:  Pending further management.  Consultants Cardiology  Vascular Surgery Plastic surgery  Procedures  Right femoral embolectomy Right calf four compartment fasciotomies  Evacuation of hematoma from bilateral right calf fasciotomies Placement of negative pressure dressings x2 Closure of lateral fasciotomy site and placement of VAC on medial fasciotomy site  Antibiotics   Anti-infectives    Start     Dose/Rate Route Frequency Ordered Stop   02/26/16 0930  [MAR Hold]  cefUROXime (ZINACEF) 1.5 g in dextrose 5 % 50 mL IVPB     (MAR Hold since 02/26/16 2947)  1.5 g 100 mL/hr over 30 Minutes Intravenous To ShortStay Surgical 02/25/16 1055 02/26/16 1120   02/22/16 0800  cefUROXime (ZINACEF) 1.5 g in dextrose 5 % 50 mL IVPB     1.5 g 100 mL/hr over 30 Minutes Intravenous Every 12 hours 02/21/16 2115 02/22/16 2140   02/22/16 0600  cefUROXime (ZINACEF) injection 1.5 g     1.5 g Intramuscular On call to O.R. 02/21/16 1523 02/21/16 1713      Subjective:   Troy Yu seen and examined today, reports feeling gassy, constipated, but refusing care, refusing medications, .   Objective:   Filed Vitals:   03/06/16 0434 03/06/16 0500 03/06/16 0745 03/06/16 1130  BP:  107/62 100/62 98/63  Pulse:  90 85 85  Temp: 99.6 F (37.6 C)  98.3 F (36.8 C)  100 F (37.8 C)  TempSrc: Oral  Oral Oral  Resp:  Height:      Weight:  99.8 kg (220 lb 0.3 oz)    SpO2:  98% 98% 98%    Intake/Output Summary (Last 24 hours) at 03/06/16 1233 Last data filed at 03/06/16 0600  Gross per 24 hour  Intake 523.12 ml  Output   2050 ml  Net -1526.88 ml   Filed Weights   03/03/16 0416 03/04/16 0500 03/06/16 0500  Weight: 101.742 kg (224 lb 4.8 oz) 102.2 kg (225 lb 5 oz) 99.8 kg (220 lb 0.3 oz)    Exam  General: Well developed, well nourished, in mild distress from bloating.   HEENT: NCAT, mucous membranes moist.   Cardiovascular: S1 S2 auscultated, no murmurs, RRR  Respiratory: Clear to auscultation bilaterally with equal chest rise.  Abdomen: firm, distended, mild generalized tenderness. Bowel sounds heard.   Extremities: warm dry without cyanosis clubbing or edema. RLE wrapped, improved oozing from the leg wound.   Neuro: AAOx3, frustrated , nonfocal  Psych: angry and frustrated.   Data Reviewed: I have personally reviewed following labs and imaging studies  CBC:  Recent Labs Lab 03/03/16 0526 03/03/16 1650 03/04/16 0318 03/05/16 0458 03/05/16 2345 03/06/16 0410  WBC 9.4  --  11.9* 15.1* 12.3* 15.2*  HGB 5.0* 10.1* 9.0* 9.7* 9.4* 8.9*  HCT 15.5* 30.1* 26.2* 29.3* 29.6* 27.8*  MCV 93.9  --  88.5 91.6 91.9 92.4  PLT 226  --  227 273 345 345   Basic Metabolic Panel:  Recent Labs Lab 03/03/16 1650 03/04/16 0318 03/05/16 0458 03/05/16 2345 03/06/16 0410  NA 130* 128* 132* 132* 130*  K 3.8 3.7 3.8 4.9 3.7  CL 99* 99* 103 103 101  CO2 GLUCOSE 140* 151* 148* 198* 197*  BUN CREATININE 0.83 0.74 0.85 0.88 0.85  CALCIUM 7.6* 7.2* 7.7* 7.3* 7.3*   GFR: Estimated Creatinine Clearance: 111.2 mL/min (by C-G formula based on Cr of 0.85). Liver Function Tests:  Recent Labs Lab 03/05/16 2345  AST 66*  ALT 16*  ALKPHOS 61  BILITOT 1.9*  PROT 5.2*  ALBUMIN 1.6*    Recent  Labs Lab 03/05/16 2345  LIPASE 58*   No results for input(s): AMMONIA in the last 168 hours. Coagulation Profile: No results for input(s): INR, PROTIME in the last 168 hours. Cardiac Enzymes: No results for input(s): CKTOTAL, CKMB, CKMBINDEX, TROPONINI in the last 168 hours. BNP (last 3 results) No results for input(s): PROBNP in the last 8760 hours. HbA1C: No results for input(s): HGBA1C in the last 72 hours.  CBG:  Recent Labs Lab 03/05/16 1120 03/05/16 1637 03/05/16 2111 03/06/16 0743 03/06/16 1132  GLUCAP 206* 175* 140* 140* 156*   Lipid Profile: No results for input(s): CHOL, HDL, LDLCALC, TRIG, CHOLHDL, LDLDIRECT in the last 72 hours. Thyroid Function Tests:  Recent Labs  03/03/16 1650  TSH 3.200   Anemia Panel: No results for input(s): VITAMINB12, FOLATE, FERRITIN, TIBC, IRON, RETICCTPCT in the last 72 hours. Urine analysis:    Component Value Date/Time   COLORURINE YELLOW 02/26/2016 0229   APPEARANCEUR CLEAR 02/26/2016 0229   LABSPEC 1.014 02/26/2016 0229   PHURINE 6.0 02/26/2016 0229   GLUCOSEU 500* 02/26/2016 0229   HGBUR NEGATIVE 02/26/2016 0229   BILIRUBINUR NEGATIVE 02/26/2016 0229   KETONESUR NEGATIVE 02/26/2016 0229   PROTEINUR NEGATIVE 02/26/2016 0229   NITRITE NEGATIVE 02/26/2016 0229   LEUKOCYTESUR NEGATIVE 02/26/2016 0229   Sepsis Labs: @LABRCNTIP (procalcitonin:4,lacticidven:4)  ) Recent Results (from the past 240 hour(s))  Culture, blood (routine x 2)     Status: None   Collection Time: 02/26/16  8:12 AM  Result Value Ref Range Status   Specimen Description BLOOD BLOOD LEFT HAND  Final   Special Requests IN PEDIATRIC BOTTLE 2.5CC  Final   Culture NO GROWTH 5 DAYS  Final   Report Status 03/02/2016 FINAL  Final  Culture, blood (routine x 2)     Status: None   Collection Time: 02/26/16  8:26 AM  Result Value Ref Range Status   Specimen Description BLOOD LEFT ANTECUBITAL  Final   Special Requests IN PEDIATRIC BOTTLE 1CC  Final    Culture NO GROWTH 5 DAYS  Final   Report Status 03/02/2016 FINAL  Final      Radiology Studies: Dg Abd Portable 1v  03/05/2016  CLINICAL DATA:  Acute onset of epigastric and right lower quadrant abdominal pain. Initial encounter. EXAM: PORTABLE ABDOMEN - 1 VIEW COMPARISON:  None. FINDINGS: Scattered air-filled loops of small bowel measure up to 3.4 cm in diameter, though nondistended loops of small bowel are also seen. This likely reflects mild small bowel dysmotility. Residual stool is noted within the colon. No free intra-abdominal air is identified, though evaluation for free air is limited on supine views. The visualized osseous structures are within normal limits; the sacroiliac joints are unremarkable in appearance. The visualized lung bases are essentially clear. IMPRESSION: Air-filled small bowel loops measure up to 3.4 cm in diameter, though nondistended loops of small bowel are also seen. This likely reflects mild small bowel dysmotility. No evidence for bowel obstruction. No free intra-abdominal air seen. Electronically Signed   By: Roanna Raider M.D.   On: 03/05/2016 23:55     Scheduled Meds: . aspirin  325 mg Oral Daily  . atorvastatin  80 mg Oral q1800  . docusate sodium  100 mg Oral Daily  . insulin aspart  0-15 Units Subcutaneous TID WC  . insulin aspart  0-5 Units Subcutaneous QHS  . insulin aspart  6 Units Subcutaneous TID WC  . insulin glargine  20 Units Subcutaneous QHS  . pantoprazole  40 mg Oral BID AC  . propranolol  10 mg Oral TID   Continuous Infusions: . heparin 1,300 Units/hr (03/06/16 0600)  . nitroGLYCERIN 10 mcg/min (03/06/16 0600)     LOS: 14 days   Time Spent in minutes   30 minutes  Marlow Berenguer D.O. on 03/06/2016 at 12:33 PM  [phone]  After 7pm go to [email or web address] - password [email or web address]  And look for the night coverage person covering for me  after hours  [employer]  [phone]

## 2016-03-06 NOTE — Progress Notes (Signed)
Pt c/o Right sided stomach pain, pt encouraged to OOB to chair, pt refused, MD/N of pt's ABD pain new orders received, this RN attempted to give patient Mylanta and prune juice, pt refused, RN later called to room to turn pt, pt uncovered to assist in turn, pt then refused to turn, pt encourage to be as mobile as possible to aide in digestion and to improve mobility, pt is verbally apropiate , a&ox4nursing will cont to monitor

## 2016-03-06 NOTE — Progress Notes (Signed)
Patient ID: Troy Yu, male   DOB: 1961/04/13, 55 y.o.   MRN: [id number] Comfortable this morning. His sons are in his room with him. Reports some abdominal pain yesterday relieved with bowel movement. 2-3+ right dorsalis pedis pulse. Dressings changed. He does have some skin loss at the edge of the skin closure on the lateral aspect with the fasciotomy closure. On the medial aspect he had a great deal of hydrogel on the wound and this was removed leaving just a small layer. Did have no bleeding and much less discomfort with dressing change All muscle looks viable. Continue local wound care as is being done.

## 2016-03-07 ENCOUNTER — Inpatient Hospital Stay (HOSPITAL_COMMUNITY): Payer: MEDICAID

## 2016-03-07 DIAGNOSIS — I749 Embolism and thrombosis of unspecified artery: Secondary | ICD-10-CM

## 2016-03-07 DIAGNOSIS — R109 Unspecified abdominal pain: Secondary | ICD-10-CM | POA: Insufficient documentation

## 2016-03-07 DIAGNOSIS — R1011 Right upper quadrant pain: Secondary | ICD-10-CM

## 2016-03-07 LAB — CBC
HCT: 25.7 % — ABNORMAL LOW (ref 39.0–52.0)
Hemoglobin: 8.4 g/dL — ABNORMAL LOW (ref 13.0–17.0)
MCH: 29.7 pg (ref 26.0–34.0)
MCHC: 32.7 g/dL (ref 30.0–36.0)
MCV: 90.8 fL (ref 78.0–100.0)
PLATELETS: 425 10*3/uL — AB (ref 150–400)
RBC: 2.83 MIL/uL — AB (ref 4.22–5.81)
RDW: 15 % (ref 11.5–15.5)
WBC: 11.4 10*3/uL — ABNORMAL HIGH (ref 4.0–10.5)

## 2016-03-07 LAB — BASIC METABOLIC PANEL
Anion gap: 6 (ref 5–15)
BUN: 5 mg/dL — AB (ref 6–20)
CHLORIDE: 105 mmol/L (ref 101–111)
CO2: 25 mmol/L (ref 22–32)
CREATININE: 0.83 mg/dL (ref 0.61–1.24)
Calcium: 7.6 mg/dL — ABNORMAL LOW (ref 8.9–10.3)
GFR calc Af Amer: >60 mL/min (ref >60)
GFR calc non Af Amer: >60 mL/min (ref >60)
Glucose, Bld: 96 mg/dL (ref 65–99)
Potassium: 3.5 mmol/L (ref 3.5–5.1)
SODIUM: 136 mmol/L (ref 135–145)

## 2016-03-07 LAB — GLUCOSE, CAPILLARY
GLUCOSE-CAPILLARY: 138 mg/dL — AB (ref 65–99)
Glucose-Capillary: 83 mg/dL (ref 65–99)
Glucose-Capillary: 105 mg/dL — ABNORMAL HIGH (ref 65–99)
Glucose-Capillary: 91 mg/dL (ref 65–99)

## 2016-03-07 LAB — HEPARIN LEVEL (UNFRACTIONATED): HEPARIN UNFRACTIONATED: 0.44 [IU]/mL (ref 0.30–0.70)

## 2016-03-07 MED ORDER — ASPIRIN EC 81 MG PO TBEC
81.0000 mg | DELAYED_RELEASE_TABLET | Freq: Every day | ORAL | Status: DC
  Administered 2016-03-07 – 2016-03-08 (×2): 81 mg via ORAL
  Filled 2016-03-07 (×2): qty 1

## 2016-03-07 NOTE — Progress Notes (Signed)
PT Cancellation Note  Patient Details Name: Dequawn Kozikowski MRN: [id number] DOB: May 03, 1961   Cancelled Treatment:    Reason Eval/Treat Not Completed: Pain limiting ability to participate;Fatigue/lethargy limiting ability to participate; attempted co-tx with OT earlier today, pt declined OOB or EOB stating just had dressing changed and already up to [facility] today (RN confirmed.)  Will cancel for PT as OT to assist with bed level exercise.  Will attempt tomorrow.    Elray Mcgregor 03/07/2016, 4:20 PM  Sheran Lawless, [place] [phone] 03/07/2016

## 2016-03-07 NOTE — Progress Notes (Signed)
  [facility] Progress Note  Subjective    Feels weak. No complaints right foot.   Objective Filed Vitals:   03/07/16 0831 03/07/16 1200  BP: 100/59 99/62  Pulse: 87 77  Temp: 99.7 F (37.6 C)   Resp: 29 22    Intake/Output Summary (Last 24 hours) at 03/07/16 1244 Last data filed at 03/07/16 1200  Gross per 24 hour  Intake   1617 ml  Output   3075 ml  Net  -1458 ml   Right leg dressing taken down. Some partial thickness skin loss around medial and lateral fasciotomy sites. Right medial calf with red granulation tissue. No bleeding. No drainage.  Lateral leg incision clean and intact. Minimal dried blood at staple line.  2+ right DP pulse.   Assessment/Planning: 10 Days Post-Op   Continue daily hydrogel dressings to right medial calf. No oozing seen.  Continue heparin.  Needs to mobilize. PT ordered yesterday.   Raymond Gurney 03/07/2016 12:44 PM --  Laboratory CBC    Component Value Date/Time   WBC 11.4* 03/07/2016 0808   HGB 8.4* 03/07/2016 0808   HCT 25.7* 03/07/2016 0808   PLT 425* 03/07/2016 0808    BMET    Component Value Date/Time   NA 136 03/07/2016 0808   K 3.5 03/07/2016 0808   CL 105 03/07/2016 0808   CO2 25 03/07/2016 0808   GLUCOSE 96 03/07/2016 0808   BUN 5* 03/07/2016 0808   CREATININE 0.83 03/07/2016 0808   CALCIUM 7.6* 03/07/2016 0808   GFRNONAA >60 03/07/2016 0808   GFRAA >60 03/07/2016 0808    COAG Lab Results  Component Value Date   INR 1.11 02/21/2016   No results found for: PTT  Antibiotics Anti-infectives    Start     Dose/Rate Route Frequency Ordered Stop   02/26/16 0930  [MAR Hold]  cefUROXime (ZINACEF) 1.5 g in dextrose 5 % 50 mL IVPB     (MAR Hold since 02/26/16 0928)   1.5 g 100 mL/hr over 30 Minutes Intravenous To ShortStay Surgical 02/25/16 1055 02/26/16 1120   02/22/16 0800  cefUROXime (ZINACEF) 1.5 g in dextrose 5 % 50 mL IVPB     1.5 g 100 mL/hr over 30 Minutes Intravenous Every 12 hours  02/21/16 2115 02/22/16 2140   02/22/16 0600  cefUROXime (ZINACEF) injection 1.5 g     1.5 g Intramuscular On call to O.R. 02/21/16 1523 02/21/16 1713       Maris Berger, PA-C [facility] Office: [phone] Pager: [phone] 03/07/2016 12:44 PM

## 2016-03-07 NOTE — Progress Notes (Signed)
Occupational Therapy Treatment Patient Details Name: Troy Yu MRN: [id number] DOB: December 19, 1960 Today's Date: 03/07/2016    History of present illness 55 y.o. male with no significant hx of cad/pvd/angina/claudication. 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 adm with complaints of red right leg pain; + NSTEMI subacute; EF 30-35%; +left ventricle thrombus; +Rt femoral embolism, 6/25 femoral embolectomy and four compartment calf fasciotomies; 6/26 Evacuation of hematoma from bilateral right calf fasciotomies with placement of negative pressure dressings x 2 with plan for partial wound closeure with VAC replacement on 02/26/16   OT comments  Pt refused OOB activity despite MAX encouragement, but promises he will get up in am.  He was provided with theraband for UB strengthening and HEP.  With encouragement, he did perform UE exercises.  Pt has not progressed toward goals, therefore goals downgraded, and based on limited activity thus far, disposition changed to SNF level rehab as I am not sure he will progress adequately to discharge to home.  Will continue to follow   Follow Up Recommendations  SNF    Equipment Recommendations  3 in 1 bedside comode    Recommendations for Other Services      Precautions / Restrictions Precautions Precautions: Fall       Mobility Bed Mobility                  Transfers                      Balance                                   ADL                                         General ADL Comments: Pt adamantly refused OOB activity despite max efforts.  He is however, agreeable to OOB in am       Vision                     Perception     Praxis      Cognition   Behavior During Therapy: [facility] for tasks assessed/performed;Anxious Overall Cognitive Status: Within Functional Limits  for tasks assessed                       Extremity/Trunk Assessment               Exercises General Exercises - Upper Extremity Shoulder Flexion: Strengthening;Right;Left;10 reps;Supine;Theraband Theraband Level (Shoulder Flexion): Level 3 (Green) Shoulder Horizontal ABduction: Strengthening;Right;Left;10 reps;Supine;Theraband Theraband Level (Shoulder Horizontal Abduction): Level 3 (Green) Other Exercises Other Exercises: Pt performed "punches with green theraband x 10 reps each UE    Shoulder Instructions       General Comments      Pertinent Vitals/ Pain       Pain Assessment: Faces Faces Pain Scale: Hurts little more Pain Location: Rt LE  Pain Descriptors / Indicators: Grimacing Pain Intervention(s): Limited activity within patient's tolerance;Monitored during session  Home Living  Prior Functioning/Environment              Frequency Min 2X/week     Progress Toward Goals  OT Goals(current goals can now be found in the care plan section)  Progress towards OT goals: Not progressing toward goals - comment;Goals drowngraded-see care plan  Acute Rehab OT Goals Patient Stated Goal: be able to walk again OT Goal Formulation: With patient Time For Goal Achievement: 03/08/16 Potential to Achieve Goals: Good ADL Goals Pt Will Perform Grooming: with min assist;standing Pt Will Perform Lower Body Dressing: with mod assist;sit to/from stand;with adaptive equipment Pt Will Transfer to Toilet: with min assist;ambulating;regular height toilet;bedside commode;grab bars Pt Will Perform Toileting - Clothing Manipulation and hygiene: with min assist;sit to/from stand  Plan Discharge plan needs to be updated    Co-evaluation                 End of Session     Activity Tolerance Patient limited by pain   Patient Left in bed;with call bell/phone within reach;with family/visitor present   Nurse  Communication Mobility status        Time: 6659-9357 OT Time Calculation (min): 24 min  Charges: OT General Charges $OT Visit: 1 Procedure OT Treatments $Therapeutic Activity: 8-22 mins $Therapeutic Exercise: 8-22 mins  Curlie Macken M 03/07/2016, 4:18 PM

## 2016-03-07 NOTE — Progress Notes (Signed)
PROGRESS NOTE    Troy Yu  ZOX:[id number] DOB: [birth year] DOA: 02/21/2016 PCP: No primary care provider on file.   Brief Narrative:  Troy Yu is an 55 y.o. male who was in his usual state of health until about 2 weeks ago when he had substernal chest pain that lasted about 48 hours.The pain went away after he vomited, and he attributed his symptoms to a prolonged religious fast. He then come to the [facility] ED on 02/21/16 with severe RLE pain associated with paresthesias. Upon presentation, he was found to have an abnormal EKG and elevated troponin. Cardiology was subsequently called to evaluate the patient, and he was felt to have suffered a subacute anterior MI. STAT echo was ordered and EDP advised to order an arterial duplex with recommendation to consult vascular surgery. IV heparin ordered and the patient was transferred to [facility] for further evaluation by vascular surgery, as it was felt that he had a right femoral embolism with concern for threatened limb. Dr Imogene Burn subsequently performed a right femoral embolectomy and right calf compartment fasciotomies x 4 . At 4:16 a.m. On 02/22/16, the patient complained of loss of sensation to RLE, and the patient reports he was in severe pain. He was given pain medication which did not alleviate his symptoms. He was re-evaluated by Dr. Imogene Burn at 7:33 a.m. Who removed the ACE wrap from his RLE which relieved the pain, and the patient was felt to have excessive bleeding in his calf related to full anti-coagulation. He was taken back to surgery for evacuation of hematoma and VAC dressings. Cardiologist continues to follow for management of his subacute MI. Patient underwent closure of lateral fasciotomy. Plastic surgery consulted and recommendations given..  7/6 patient's hemoglobin dropped to 5 from continuous oozing from the leg wound, and his heparin was stopped on 7/5.  Dr Early recommended surgical evaluation by plastic surgery, but patient does not  want any further surgery at this time and wanted to address the bleeding from the leg wound with local wound care. Explained to him that he needs IV heparin for his LV thrombus and he is at risk from cardiac complications without IV heparin. And at the same time  he continues to bleed on IV heparin, he needs the  Leg wound to be evaluated by the plastic surgery for possible skin graft surgery.   7/7 pt 's hemoglobin improved to 10 , dropped back to 9. Today he is refusing to have his dressing changed despite the surgeon's recommendation.  7/8 patient restarted on IV heparin, as oozing from the leg has improved and hemoglobin stable at 9.7 7/9 abdominal distention, and abd film shows gas filled small bowel loops, recommended to get OOB and ambulate.  Fevers on and off over the 24 to 36 hrs, blood culture, CXR, UA, and CT abdomen ordered .  7/10 work up revealed CT abd showing cholelithiasis with fingings concerning for early acute cholecystitis.  US abdomen ordered.    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  -Case taken over by Dr. Arbie Cookey after patient requested a second opinion.  -Lipids well controlled, cholesterol 116, LDL 60.  -s/p closure of lateral fasciotomy site and placement of VAC on medial fasciotomy site -Plastic surgery  consulted by vascular for possible STSG to medial fasciotomy site, but patient refusing any further surgical procedures.  -Wound vac removed (medial) and  Daily hydrogel application and wet-to-dry dressings - 7/4 leg wound continues to ooze blood and dressing changed several times and Dr Early contacted and IV heparin was discontinued.  - 7/6 his  hemoglobin dropped to 5 and he received 3 units of prbc transfusion. IV heparin still on hold. Repeat hemoglobin improved .  - 7/8 heparin restarted as his hemoglobin is stable and oozing from the wound has decreased.  7/10 no changes in management.   Symptomatic anemia/ Postoperative anemia/ Anemia secondary to blood loss -During hospitalization, hemoglobin has dropped to as low as 4.7 , continues to drop , requiring many prbc transfusions.  -Has received a total of 10 u PRBCs this admission -Hemoglobin 5 on 7/6 and he received 3 units of prbc transfusion and the repeat hemoglobin is 10-9.  - anemia from blood loss from continuous oozing from the leg wound. IV heparin was held on 7/5.  - received prbc transfusion and rpeat hemoglobin improved and oozing from leg improved.  - restarted IV heparin on 7/8  - transfuse to keep hemoglobin greater than 8.  Repeat hemoglobin i s 8.4 on 7/10    Syncope and orthostasis -Likely secondary to blood loss -Continue to monitor closely  Anterior subendocardial MI (HCC)/Abnormal EKG/Elevated troponin -On therapeutic dose heparin, which was held on 7/4 for continuous oozing from the leg wound. Restarted IV heparin on 7/8 as his oozing from the leg wound decreased and his hemoglobin is stable.  -Cardiology consulted and appreciated -Continue Inderal -ACE-I to be started when BP able to tolerate -Will eventually need cardiac cath, Schedule as per cardiology.   Newly diagnosed diabetes mellitus/Hyperglycemia -Hemoglobin A1c 9.2.  -Continue lantus, ISS and CBG monitoring.  -Diabetes coordinator consulted - CBG (last 3)   Recent Labs  03/07/16 0833 03/07/16 1257 03/07/16 1641  GLUCAP 83 105* 138*   Resume SSI.    Hyponatremia - much improved.  Get serum osmolarity is 273 , urine osmo pending, TSH and am cortisol normal limits. .  -Continue to monitor BMP intermittently.  Abdominal distention, and constipation: Overnight pt reports  abdominal pain and feeling bloated. AXR shows air filled small bowels, probably from small bowel dysmotility. Recommended he get out of bed and ambulate which will help relieve the gas, and help with constipation. He is adamant about getting out of bed, does not want to participate or attempt to get oob.  Requesting medications , but refusing to take them.  Discussed with the patient, that he has to get OOB,. Added on simethicone to help, in addition to the laxatives.   Fevers on and OFF  associated with chills, none today.  Initially thought to be from blood transfusions, but hisT max OF 102 yesterday. Septic work up including   Blood cultures ordered, CXR, UA, and CT abd and pelvis without contrast ordered for further evaluation. CT abd shows cholelithiasis with fingings concerning for early acute cholecystitis.  US abdomen ordered, though his abdominal exam is benign.   Constipation: Resolved after enema yesterday.     DVT Prophylaxis  IV heparin  restarted. .   Code Status: Full  Family Communication: Son at bedside, discussed the plan of care with the patient and son.   Disposition Plan:  Pending further management.  Consultants Cardiology  Vascular Surgery Plastic surgery  Procedures  Right femoral embolectomy Right calf four compartment fasciotomies  Evacuation of hematoma from bilateral right calf fasciotomies Placement of negative pressure dressings x2 Closure of lateral fasciotomy site and placement of VAC on medial fasciotomy site  Antibiotics   Anti-infectives    Start     Dose/Rate Route Frequency Ordered Stop   02/26/16 0930  [MAR Hold]  cefUROXime (ZINACEF) 1.5 g in dextrose 5 % 50 mL IVPB     (MAR Hold since 02/26/16 0928)   1.5 g 100 mL/hr over 30 Minutes Intravenous To ShortStay Surgical 02/25/16 1055 02/26/16 1120   02/22/16 0800  cefUROXime (ZINACEF) 1.5 g in dextrose 5 % 50 mL IVPB     1.5 g 100 mL/hr over 30 Minutes Intravenous Every 12 hours 02/21/16  2115 02/22/16 2140   02/22/16 0600  cefUROXime (ZINACEF) injection 1.5 g     1.5 g Intramuscular On call to O.R. 02/21/16 1523 02/21/16 1713      Subjective:   Deundra Annunziato seen and examined today, denies any pain, recommended to get OOB to chair and ambulate. Very reluctant to get oob.   Objective:   Filed Vitals:   03/07/16 1300 03/07/16 1400 03/07/16 1600 03/07/16 1642  BP: 97/63  95/61 103/66  Pulse: 72 72 68 72  Temp:    98.6 F (37 C)  TempSrc:    Oral  Resp: 20 18 20 27   Height:      Weight:      SpO2: 98% 98% 99% 98%    Intake/Output Summary (Last 24 hours) at 03/07/16 1719 Last data filed at 03/07/16 1600  Gross per 24 hour  Intake   1435 ml  Output   2225 ml  Net   -790 ml   Filed Weights   03/04/16 0500 03/06/16 0500 03/07/16 0330  Weight: 102.2 kg (225 lb 5 oz) 99.8 kg (220 lb 0.3 oz) 94.3 kg (207 lb 14.3 oz)    Exam  General: Well developed, well nourished, in mild distress from bloating.   HEENT: NCAT, mucous membranes moist.   Cardiovascular: S1 S2 auscultated, no murmurs, RRR  Respiratory: Clear to auscultation bilaterally with equal chest rise.  Abdomen: firm, distended, mild generalized tenderness. Bowel sounds heard.   Extremities: warm dry without cyanosis clubbing or edema. RLE wrapped, improved oozing from the leg wound.   Neuro: AAOx3, frustrated , nonfocal  Psych: angry and frustrated.   Data Reviewed: I have personally reviewed following labs and imaging studies  CBC:  Recent Labs Lab 03/04/16 0318 03/05/16 0458 03/05/16 2345 03/06/16 0410 03/07/16 0808  WBC 11.9* 15.1* 12.3* 15.2* 11.4*  HGB 9.0* 9.7* 9.4* 8.9* 8.4*  HCT 26.2* 29.3* 29.6* 27.8* 25.7*  MCV 88.5 91.6 91.9 92.4 90.8  PLT 227 273 345 345 425*   Basic Metabolic Panel:  Recent Labs Lab 03/04/16 0318 03/05/16 0458 03/05/16 2345 03/06/16 0410 03/07/16 0808  NA 128* 132* 132* 130* 136  K 3.7 3.8 4.9 3.7 3.5  CL 99* 103 103 101 105  CO2 23 23 25  22 25   GLUCOSE 151* 148* 198* 197* 96  BUN 7 9 11 11  5*  CREATININE 0.74 0.85 0.88 0.85 0.83  CALCIUM 7.2* 7.7* 7.3* 7.3* 7.6*   GFR: Estimated Creatinine Clearance: 110.8 mL/min (by C-G formula based on Cr of 0.83). Liver Function Tests:  Recent Labs Lab 03/05/16 2345  AST 66*  ALT 16*  ALKPHOS 61  BILITOT 1.9*  PROT 5.2*  ALBUMIN 1.6*    Recent Labs Lab 03/05/16 2345  LIPASE 58*   No results  for input(s): AMMONIA in the last 168 hours. Coagulation Profile: No results for input(s): INR, PROTIME in the last 168 hours. Cardiac Enzymes: No results for input(s): CKTOTAL, CKMB, CKMBINDEX, TROPONINI in the last 168 hours. BNP (last 3 results) No results for input(s): PROBNP in the last 8760 hours. HbA1C: No results for input(s): HGBA1C in the last 72 hours. CBG:  Recent Labs Lab 03/06/16 1646 03/06/16 2118 03/07/16 0833 03/07/16 1257 03/07/16 1641  GLUCAP 119* 125* 83 105* 138*   Lipid Profile: No results for input(s): CHOL, HDL, LDLCALC, TRIG, CHOLHDL, LDLDIRECT in the last 72 hours. Thyroid Function Tests: No results for input(s): TSH, T4TOTAL, FREET4, T3FREE, THYROIDAB in the last 72 hours. Anemia Panel: No results for input(s): VITAMINB12, FOLATE, FERRITIN, TIBC, IRON, RETICCTPCT in the last 72 hours. Urine analysis:    Component Value Date/Time   COLORURINE YELLOW 03/06/2016 1500   APPEARANCEUR CLEAR 03/06/2016 1500   LABSPEC 1.008 03/06/2016 1500   PHURINE 6.5 03/06/2016 1500   GLUCOSEU NEGATIVE 03/06/2016 1500   HGBUR NEGATIVE 03/06/2016 1500   BILIRUBINUR NEGATIVE 03/06/2016 1500   KETONESUR NEGATIVE 03/06/2016 1500   PROTEINUR NEGATIVE 03/06/2016 1500   NITRITE NEGATIVE 03/06/2016 1500   LEUKOCYTESUR NEGATIVE 03/06/2016 1500   Sepsis Labs: @LABRCNTIP (procalcitonin:4,lacticidven:4)  ) Recent Results (from the past 240 hour(s))  Culture, blood (Routine X 2) w Reflex to ID Panel     Status: None (Preliminary result)   Collection Time: 03/06/16  10:40 AM  Result Value Ref Range Status   Specimen Description BLOOD BLOOD LEFT ARM  Final   Special Requests IN PEDIATRIC BOTTLE 1CC  Final   Culture NO GROWTH < 24 HOURS  Final   Report Status PENDING  Incomplete  Culture, blood (Routine X 2) w Reflex to ID Panel     Status: None (Preliminary result)   Collection Time: 03/06/16 10:45 AM  Result Value Ref Range Status   Specimen Description BLOOD BLOOD LEFT ARM  Final   Special Requests IN PEDIATRIC BOTTLE 1CC  Final   Culture NO GROWTH < 24 HOURS  Final   Report Status PENDING  Incomplete      Radiology Studies: Dg Chest 2 View  03/06/2016  CLINICAL DATA:  Fever EXAM: CHEST  2 VIEW COMPARISON:  February 26, 2016 FINDINGS: There is slight atelectasis in the left lower lobe. The lungs elsewhere are clear. Heart is upper normal in size with pulmonary vascularity within normal limits. There is prominence of the mediastinum at the level of the aortic arch. No adenopathy. No bone lesions. IMPRESSION: Mild left base atelectasis. No edema or consolidation. Heart is upper normal in size. Note that there is prominence of the mediastinum at the level of the aortic arch. The significance of this finding is uncertain. This finding may warrant contrast enhanced chest CT to evaluate the aorta in this region. These results will be called to the ordering clinician or representative by the Radiologist Assistant, and communication documented in the PACS or zVision Dashboard. Electronically Signed   By: Bretta Bang III M.D.   On: 03/06/2016 14:26   Ct Chest W Contrast  03/06/2016  CLINICAL DATA:  55 year old male with history of right-sided abdominal pain. Substernal chest pain 2 weeks ago lasting for 48 hours. EXAM: CT CHEST, ABDOMEN, AND PELVIS WITH CONTRAST TECHNIQUE: Multidetector CT imaging of the chest, abdomen and pelvis was performed following the standard protocol during bolus administration of intravenous contrast. CONTRAST:  100 mL of Isovue-300.  COMPARISON:  No priors. FINDINGS: CT  CHEST FINDINGS Mediastinum/Lymph Nodes: Heart size is enlarged. There is no significant pericardial fluid, thickening or pericardial calcification. 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 Image 29 of series 2 demonstrates a small 7 mm filling defect in the apex of the left ventricle, compatible with apical thrombus. There is no significant pericardial fluid, thickening or pericardial calcification. No pathologically enlarged mediastinal or hilar lymph nodes. No aneurysm or dissection of the thoracic aorta. Esophagus is unremarkable in appearance. No axillary lymphadenopathy. Lungs/Pleura: Linear areas of scarring and/or subsegmental atelectasis are noted in the lower lobes of the lungs bilaterally. Trace bilateral pleural effusions. No confluent consolidative airspace disease. No definite suspicious appearing pulmonary nodules or masses are noted (assessment is limited by considerable patient respiratory motion). Musculoskeletal/Soft Tissues: There are no aggressive appearing lytic or blastic lesions noted in the visualized portions of the skeleton. CT ABDOMEN AND PELVIS FINDINGS Hepatobiliary: No cystic or solid hepatic lesions. No intra or extrahepatic biliary ductal dilatation. Small calcified gallstones are noted dependently in the gallbladder, measuring up to 7 mm in diameter. Gallbladder wall appears mildly thickened, and there are subtle pericholecystic inflammatory changes. Gallbladder is moderately distended. Pancreas: No pancreatic mass. No pancreatic ductal dilatation. No pancreatic or peripancreatic fluid or inflammatory changes. Spleen: Unremarkable. Adrenals/Urinary Tract: Bilateral adrenal glands and bilateral kidneys are normal in appearance. No  hydroureteronephrosis. Urinary bladder is normal in appearance. Stomach/Bowel: The appearance of the stomach is normal. There is no pathologic dilatation of small bowel or colon. Normal appendix. Vascular/Lymphatic: No significant atherosclerotic disease, aneurysm or dissection identified in the abdominal or pelvic vasculature. There is some intermediate to high attenuation fluid adjacent to the right common femoral vessels, presumably a postprocedural hematoma from recent catheterization procedure. No findings to suggest acute active extravasation at this time. No lymphadenopathy noted in the abdomen or pelvis. Reproductive: Prostate gland and seminal vesicles are unremarkable in appearance. Other: No significant volume of ascites.  No pneumoperitoneum. Musculoskeletal: There are no aggressive appearing lytic or blastic lesions noted in the visualized portions of the skeleton. IMPRESSION: 1. Cholelithiasis with findings concerning for potential early acute cholecystitis. This should be further evaluated with right upper quadrant abdominal ultrasound if there is clinical concern for acute cholecystitis. 2. The patient continues to have a small volume of apical thrombus in the left ventricle. 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 3. Areas of scarring and/or subsegmental atelectasis in the lower lobes of the lungs bilaterally. 4. Trace bilateral pleural effusions lying dependently. 5. Resolving postprocedural hematoma adjacent to the right common femoral vessels, likely from prior catheterization procedure. No findings to suggest acute active extravasation at this time. 6. Additional incidental findings, as above. These results will be called to the ordering clinician or representative by the Radiologist Assistant, and communication documented in the PACS or zVision Dashboard. Electronically Signed    By: Trudie Reed M.D.   On: 03/06/2016 18:20   Ct Abdomen Pelvis W Contrast  03/06/2016  CLINICAL DATA:  54 year old male with history of right-sided abdominal pain. Substernal chest pain 2 weeks ago lasting for 48 hours. EXAM: CT CHEST, ABDOMEN, AND PELVIS WITH CONTRAST TECHNIQUE: Multidetector CT imaging of the chest, abdomen and pelvis was performed following the standard protocol during bolus administration of intravenous contrast. CONTRAST:  100 mL of Isovue-300. COMPARISON:  No priors. FINDINGS: CT CHEST FINDINGS Mediastinum/Lymph Nodes: Heart size is enlarged. There is no significant pericardial fluid, thickening or pericardial calcification. 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 Image 29 of series 2 demonstrates a small 7 mm filling defect in the apex of the left ventricle, compatible with apical thrombus. There is no significant pericardial fluid, thickening or pericardial calcification. No pathologically enlarged mediastinal or hilar lymph nodes. No aneurysm or dissection of the thoracic aorta. Esophagus is unremarkable in appearance. No axillary lymphadenopathy. Lungs/Pleura: Linear areas of scarring and/or subsegmental atelectasis are noted in the lower lobes of the lungs bilaterally. Trace bilateral pleural effusions. No confluent consolidative airspace disease. No definite suspicious appearing pulmonary nodules or masses are noted (assessment is limited by considerable patient respiratory motion). Musculoskeletal/Soft Tissues: There are no aggressive appearing lytic or blastic lesions noted in the visualized portions of the skeleton. CT ABDOMEN AND PELVIS FINDINGS Hepatobiliary: No cystic or solid hepatic lesions. No intra or extrahepatic biliary ductal dilatation. Small  calcified gallstones are noted dependently in the gallbladder, measuring up to 7 mm in diameter. Gallbladder wall appears mildly thickened, and there are subtle pericholecystic inflammatory changes. Gallbladder is moderately distended. Pancreas: No pancreatic mass. No pancreatic ductal dilatation. No pancreatic or peripancreatic fluid or inflammatory changes. Spleen: Unremarkable. Adrenals/Urinary Tract: Bilateral adrenal glands and bilateral kidneys are normal in appearance. No hydroureteronephrosis. Urinary bladder is normal in appearance. Stomach/Bowel: The appearance of the stomach is normal. There is no pathologic dilatation of small bowel or colon. Normal appendix. Vascular/Lymphatic: No significant atherosclerotic disease, aneurysm or dissection identified in the abdominal or pelvic vasculature. There is some intermediate to high attenuation fluid adjacent to the right common femoral vessels, presumably a postprocedural hematoma from recent catheterization procedure. No findings to suggest acute active extravasation at this time. No lymphadenopathy noted in the abdomen or pelvis. Reproductive: Prostate gland and seminal vesicles are unremarkable in appearance. Other: No significant volume of ascites.  No pneumoperitoneum. Musculoskeletal: There are no aggressive appearing lytic or blastic lesions noted in the visualized portions of the skeleton. IMPRESSION: 1. Cholelithiasis with findings concerning for potential early acute cholecystitis. This should be further evaluated with right upper quadrant abdominal ultrasound if there is clinical concern for acute cholecystitis. 2. The patient continues to have a small volume of apical thrombus in the left ventricle. 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 3. Areas of scarring and/or subsegmental atelectasis in the lower lobes of the lungs  bilaterally. 4. Trace bilateral pleural effusions lying dependently. 5. Resolving postprocedural hematoma adjacent to the right common femoral vessels, likely from prior catheterization procedure. No findings to suggest acute active extravasation at this time. 6. Additional incidental findings, as above. These results will be called to the ordering clinician or representative by the Radiologist Assistant, and communication documented in the PACS or zVision Dashboard. Electronically Signed   By: Trudie Reed M.D.   On: 03/06/2016 18:20   Dg Abd Portable 1v  03/05/2016  CLINICAL DATA:  Acute onset of epigastric and right lower quadrant abdominal pain. Initial encounter. EXAM: PORTABLE ABDOMEN - 1 VIEW COMPARISON:  None. FINDINGS: Scattered air-filled loops of small bowel measure up to 3.4 cm in diameter, though nondistended loops of small bowel  are also seen. This likely reflects mild small bowel dysmotility. Residual stool is noted within the colon. No free intra-abdominal air is identified, though evaluation for free air is limited on supine views. The visualized osseous structures are within normal limits; the sacroiliac joints are unremarkable in appearance. The visualized lung bases are essentially clear. IMPRESSION: Air-filled small bowel loops measure up to 3.4 cm in diameter, though nondistended loops of small bowel are also seen. This likely reflects mild small bowel dysmotility. No evidence for bowel obstruction. No free intra-abdominal air seen. Electronically Signed   By: Roanna Raider M.D.   On: 03/05/2016 23:55     Scheduled Meds: . aspirin EC  81 mg Oral Daily  . atorvastatin  80 mg Oral q1800  . docusate sodium  100 mg Oral Daily  . insulin aspart  0-15 Units Subcutaneous TID WC  . insulin aspart  0-5 Units Subcutaneous QHS  . insulin aspart  6 Units Subcutaneous TID WC  . insulin glargine  20 Units Subcutaneous QHS  . pantoprazole  40 mg Oral BID AC  . propranolol  10 mg Oral TID     Continuous Infusions: . heparin 1,300 Units/hr (03/07/16 1600)  . nitroGLYCERIN 10 mcg/min (03/07/16 1600)     LOS: 15 days   Time Spent in minutes   30 minutes  Nickole Adamek D.O. on 03/07/2016 at 5:19 PM  [phone]  After 7pm go to [email or web address] - password [email or web address]  And look for the night coverage person covering for me after hours  [facility] Office  [phone]

## 2016-03-07 NOTE — Progress Notes (Signed)
PATIENT ID: [name] is a 50M with diabetes here with subacute anterior MI complicated by LV thrombus and right femoral artery embolism now s/p embolectomy and R calf fasciotomy for compartment syndrome.  This was complicated by post operative anemia.  Wound vac now removed and patient refusing further intervention by plastic surgery.    SUBJECTIVE:  Feeling well.  Denies chest pain or shortness of breath.  He does not think he had a heart attack.  He thinks this occurred because his body was exposed to hot weather and developed a clot.   PHYSICAL EXAM Filed Vitals:   03/06/16 2358 03/07/16 0330 03/07/16 0800 03/07/16 0831  BP: 92/59 98/62 100/59 100/59  Pulse: 83 76 89 87  Temp: 98.8 F (37.1 C) 98.4 F (36.9 C)  99.7 F (37.6 C)  TempSrc: Oral Oral  Oral  Resp: 25 26 25 29   Height:      Weight:  207 lb 14.3 oz (94.3 kg)    SpO2: 95% 100% 99% 99%   General:  Well-appearing.  No acute distress. Neck: No JVD Lungs:  CTAB.  No crackles, rhonchi or wheezes Heart:  RRR.  No m/r/g.  Normal S1/S2.   Abdomen:  Soft, NT, ND.  +BS Extremities:  R LE dressing in place.  No LLE edema   LABS: Lab Results  Component Value Date   TROPONINI 0.85* 02/22/2016   Results for orders placed or performed during the hospital encounter of 02/21/16 (from the past 24 hour(s))  Glucose, capillary     Status: Abnormal   Collection Time: 03/06/16 11:32 AM  Result Value Ref Range   Glucose-Capillary 156 (H) 65 - 99 mg/dL   Comment 1 Capillary Specimen   Heparin level (unfractionated)     Status: None   Collection Time: 03/06/16 12:49 PM  Result Value Ref Range   Heparin Unfractionated 0.35 0.30 - 0.70 IU/mL  Osmolality, urine     Status: Abnormal   Collection Time: 03/06/16  3:00 PM  Result Value Ref Range   Osmolality, Ur 201 (L) 300 - 900 mOsm/kg  Urinalysis, Routine w reflex microscopic (not at [facility])     Status: None   Collection Time: 03/06/16  3:00 PM  Result Value Ref Range   Color, Urine YELLOW YELLOW   APPearance CLEAR CLEAR   Specific Gravity, Urine 1.008 1.005 - 1.030   pH 6.5 5.0 - 8.0   Glucose, UA NEGATIVE NEGATIVE mg/dL   Hgb urine dipstick NEGATIVE NEGATIVE   Bilirubin Urine NEGATIVE NEGATIVE   Ketones, ur NEGATIVE NEGATIVE mg/dL   Protein, ur NEGATIVE NEGATIVE mg/dL   Nitrite NEGATIVE NEGATIVE   Leukocytes, UA NEGATIVE NEGATIVE  Glucose, capillary     Status: Abnormal   Collection Time: 03/06/16  4:46 PM  Result Value Ref Range   Glucose-Capillary 119 (H) 65 - 99 mg/dL  Glucose, capillary     Status: Abnormal   Collection Time: 03/06/16  9:18 PM  Result Value Ref Range   Glucose-Capillary 125 (H) 65 - 99 mg/dL   Comment 1 Capillary Specimen   Glucose, capillary     Status: None   Collection Time: 03/07/16  8:33 AM  Result Value Ref Range   Glucose-Capillary 83 65 - 99 mg/dL    Intake/Output Summary (Last 24 hours) at 03/07/16 0844 Last data filed at 03/07/16 0800  Gross per 24 hour  Intake   2007 ml  Output   2775 ml  Net   -768 ml  Telemetry:  No events  Echo 02/21/16: Study Conclusions  - Left ventricle: Anteroapical infarct with large thrombus burden  at apex. Mobile and high embolic potential. The cavity size was  severely dilated. Wall thickness was normal. Systolic function  was moderately to severely reduced. The estimated ejection  fraction was in the range of 30% to 35%. - Atrial septum: No defect or patent foramen ovale was identified.   ASSESSMENT AND PLAN:  Principal Problem:   LV (left ventricular) mural thrombus (HCC) Active Problems:   Anterior subendocardial MI (HCC)   Right leg pain   Non-STEMI (non-ST elevated myocardial infarction) (HCC)   Elevated troponin   Abnormal EKG   Diabetes (HCC)   Acute MI (HCC)   History of embolectomy   Pain   Thromboembolism (HCC)   Vascular occlusion   Fever   NSTEMI (non-ST elevated myocardial infarction) (HCC)   Chest pain    # Subacute anterior MI: Mr.  Troy Yu echo is consistent with an anterior MI.  He is clinically stable from a cardiovascular standpoint but will need LHC.  He has been receiving multiple blood transfusions, last on 7/6.  Awaiting h/h today.  It seems as though additional surgeries are recommended but he is refusing.  We will continue to follow from afar.  Please advised when he is deemed stable for dual antiplatelet therapy and warfarin and we will proceed with LHC. Will reduce aspirin to 81 mg daily.  Continue propranolol. LDL is 60.  Atorvastatin was started this admission.  He will need lipids and LFTs in 6 weeks.  # LV thrombus: Continue heparin.  Will transition to warfarin after cath and all surgeries.  # R LE embolism s/p embolectomy and fasciotomy: Per Vascular Surgery.    # Post-operative anemia: Improving.  Last transfusion 03/03/16.  Troy Yu C. Duke Salvia, MD, [facility] 03/07/2016 8:44 AM

## 2016-03-07 NOTE — Progress Notes (Signed)
ANTICOAGULATION CONSULT NOTE  Pharmacy Consult for Heparin  Indication:  LV thrombus and R femoral embolism s/p embolectomy  No Known Allergies  Patient Measurements: Height: 5\' 8"  (172.7 cm) Weight: 207 lb 14.3 oz (94.3 kg) IBW/kg (Calculated) : 68.4  Heparin dosing wt: 87 kg  Labs:  Recent Labs  03/05/16 2345 03/06/16 0410 03/06/16 1249 03/07/16 0808  HGB 9.4* 8.9*  --  8.4*  HCT 29.6* 27.8*  --  25.7*  PLT 345 345  --  425*  HEPARINUNFRC  --  0.21* 0.35 0.44  CREATININE 0.88 0.85  --  0.83   Estimated Creatinine Clearance: 108.2 mL/min (by C-G formula based on Cr of 0.85).  Assessment: 55 yo male with LV thrombus s/p R femoral embolectomy and subsequent hematoma evacuation, for heparin.  His heparin was turned off due to complications of bleeding but resumed on 7/8.    HL this AM remains therapeutic at 0.44 on heparin 1300 units/hr. Hgb continues to trend down. No issues with infusion or bleeding noted.  Goal of Therapy:  Heparin level 0.3-0.5 units/ml  Monitor platelets by anticoagulation protocol: Yes   Plan:  Continue Heparin 1300 units/hr Daily HL/CBC Monitor closely for bleeding complications  Arlean Hopping. Newman Pies, PharmD, BCPS Clinical Pharmacist Pager [phone] 03/07/2016 8:50 AM

## 2016-03-08 LAB — BASIC METABOLIC PANEL
Anion gap: 6 (ref 5–15)
BUN: 5 mg/dL — AB (ref 6–20)
CALCIUM: 7.3 mg/dL — AB (ref 8.9–10.3)
CO2: 22 mmol/L (ref 22–32)
Chloride: 107 mmol/L (ref 101–111)
Creatinine, Ser: 0.83 mg/dL (ref 0.61–1.24)
GFR calc Af Amer: >60 mL/min (ref >60)
GLUCOSE: 104 mg/dL — AB (ref 65–99)
Potassium: 3.5 mmol/L (ref 3.5–5.1)
Sodium: 135 mmol/L (ref 135–145)

## 2016-03-08 LAB — GLUCOSE, CAPILLARY
GLUCOSE-CAPILLARY: 100 mg/dL — AB (ref 65–99)
GLUCOSE-CAPILLARY: 104 mg/dL — AB (ref 65–99)
GLUCOSE-CAPILLARY: 157 mg/dL — AB (ref 65–99)
GLUCOSE-CAPILLARY: 81 mg/dL (ref 65–99)
Glucose-Capillary: 137 mg/dL — ABNORMAL HIGH (ref 65–99)

## 2016-03-08 LAB — CBC
HCT: 26.6 % — ABNORMAL LOW (ref 39.0–52.0)
HEMOGLOBIN: 8.7 g/dL — AB (ref 13.0–17.0)
MCH: 30.1 pg (ref 26.0–34.0)
MCHC: 32.7 g/dL (ref 30.0–36.0)
MCV: 92 fL (ref 78.0–100.0)
Platelets: 431 10*3/uL — ABNORMAL HIGH (ref 150–400)
RBC: 2.89 MIL/uL — ABNORMAL LOW (ref 4.22–5.81)
RDW: 15.1 % (ref 11.5–15.5)
WBC: 8.5 10*3/uL (ref 4.0–10.5)

## 2016-03-08 LAB — HEPARIN LEVEL (UNFRACTIONATED): HEPARIN UNFRACTIONATED: 0.34 [IU]/mL (ref 0.30–0.70)

## 2016-03-08 NOTE — Progress Notes (Signed)
Physical Therapy Treatment Patient Details Name: Troy Yu MRN: [id number] DOB: February 03, 1961 Today's Date: 03/08/2016    History of Present Illness 55 y.o. male with no significant hx of cad/pvd/angina/claudication. 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 adm with complaints of red right leg pain; + NSTEMI subacute; EF 30-35%; +left ventricle thrombus; +Rt femoral embolism, 6/25 femoral embolectomy and four compartment calf fasciotomies; 6/26 Evacuation of hematoma from bilateral right calf fasciotomies with placement of negative pressure dressings x 2 with plan for partial wound closeure with VAC replacement on 02/26/16    PT Comments    Galo continues to require max encouragement and education on benefits of therapy to participate.  Pt completed squat pivot to [facility] and stand pivot to chair with min assist and max cues for technique and safety.  Pt refuses to attempt ambulation or additional exercises at this time.  Given pt's current functional status, follow up recommendations have been updated to SNF.   Follow Up Recommendations  SNF;Supervision for mobility/OOB     Equipment Recommendations  Rolling walker with 5" wheels    Recommendations for Other Services       Precautions / Restrictions Precautions Precautions: Fall Restrictions Weight Bearing Restrictions: No    Mobility  Bed Mobility Overal bed mobility: Needs Assistance Bed Mobility: Supine to Sit     Supine to sit: Min guard;HOB elevated     General bed mobility comments: HOB elevated and pt uses bed rail with increased time  Transfers Overall transfer level: Needs assistance Equipment used: Rolling walker (2 wheeled) Transfers: Sit to/from Office manager Transfers Sit to Stand: Min assist;From elevated surface Stand pivot transfers: Min assist Squat pivot transfers: Min  assist     General transfer comment: Despite cues to stand and use RW to pivot to [facility] pt performs squat pivot using armrests of BSC for support.  Pt achieves full standing from [facility] to pivot to chair with assist to steady and max verbal cues for sequencing, technique, and hand placement.   Ambulation/Gait             General Gait Details: deferred for pt/therapist safety   Stairs            Wheelchair Mobility    Modified Rankin (Stroke Patients Only)       Balance Overall balance assessment: Needs assistance Sitting-balance support: Feet supported;No upper extremity supported Sitting balance-Leahy Scale: Good     Standing balance support: Bilateral upper extremity supported;During functional activity Standing balance-Leahy Scale: Poor Standing balance comment: Relies on RW and physical assist to steady                    Cognition Arousal/Alertness: Awake/alert Behavior During Therapy: Flat affect;Impulsive Overall Cognitive Status:  (poor safety awareness)                      Exercises General Exercises - Lower Extremity Ankle Circles/Pumps: AROM;Left;10 reps;Supine (pt refused performing on Rt ) Quad Sets: Right;5 reps;Seated (max encouragement to complete)    General Comments General comments (skin integrity, edema, etc.): As pt refusing therapeutic exercise this PT educated pt on the benefits of mobility and exercise to decrease tightness and pain, pt and son verbalized understanding.  However, pt continues to refuse exercises.      Pertinent Vitals/Pain Pain Assessment: Faces Faces Pain Scale: Hurts  even more Pain Location: Rt LE Pain Descriptors / Indicators: Grimacing;Guarding;Constant Pain Intervention(s): Limited activity within patient's tolerance;Monitored during session;Repositioned    Home Living                      Prior Function            PT Goals (current goals can now be found in the care plan section)  Acute Rehab PT Goals Patient Stated Goal: decreased pain PT Goal Formulation: With patient/family Time For Goal Achievement: 03/15/16 Potential to Achieve Goals: Fair Progress towards PT goals: Not progressing toward goals - comment (requires max encouragement to participate)    Frequency  Min 3X/week    PT Plan Discharge plan needs to be updated;Frequency needs to be updated    Co-evaluation             End of Session Equipment Utilized During Treatment: Gait belt Activity Tolerance: Patient limited by pain Patient left: in chair;with call bell/phone within reach;with chair alarm set;with family/visitor present     Time: 2244-9753 PT Time Calculation (min) (ACUTE ONLY): 36 min  Charges:  $Therapeutic Activity: 23-37 mins                    G Codes:       Encarnacion Chu PT, DPT  Pager: [phone] Phone: [phone] 03/08/2016, 10:11 AM

## 2016-03-08 NOTE — Progress Notes (Signed)
PROGRESS NOTE    Troy Yu  XQJ:[id number] DOB: 10-19-1960 DOA: 02/21/2016 PCP: No primary care provider on file.   Brief Narrative:  Troy Yu is an 55 y.o. male who was in his usual state of health until about 2 weeks ago when he had substernal chest pain that lasted about 48 hours.The pain went away after he vomited, and he attributed his symptoms to a prolonged religious fast. He then come to the [facility] ED on 02/21/16 with severe RLE pain associated with paresthesias. Upon presentation, he was found to have an abnormal EKG and elevated troponin. Cardiology was subsequently called to evaluate the patient, and he was felt to have suffered a subacute anterior MI. STAT echo was ordered and EDP advised to order an arterial duplex with recommendation to consult vascular surgery. IV heparin ordered and the patient was transferred to [facility] for further evaluation by vascular surgery, as it was felt that he had a right femoral embolism with concern for threatened limb. Dr Imogene Burn subsequently performed a right femoral embolectomy and right calf compartment fasciotomies x 4 . At 4:16 a.m. On 02/22/16, the patient complained of loss of sensation to RLE, and the patient reports he was in severe pain. He was given pain medication which did not alleviate his symptoms. He was re-evaluated by Dr. Imogene Burn at 7:33 a.m. Who removed the ACE wrap from his RLE which relieved the pain, and the patient was felt to have excessive bleeding in his calf related to full anti-coagulation. He was taken back to surgery for evacuation of hematoma and VAC dressings. Cardiologist continues to follow for management of his subacute MI. Patient underwent closure of lateral fasciotomy. Plastic surgery consulted and recommendations given. 7/6 patient's hemoglobin dropped to 5 from continuous oozing from the leg wound, and his heparin was stopped on 7/5. Dr Early recommended surgical evaluation by plastic surgery, but patient does not want  any further surgery at this time and wanted to address the bleeding from the leg wound with local wound care. Explained to him that he needs IV heparin for his LV thrombus and he is at risk from cardiac complications without IV heparin. And at the same time  he continues to bleed on IV heparin, he needs the  Leg wound to be evaluated by the plastic surgery for possible skin graft surgery.   7/7 pt 's hemoglobin improved to 10 , and restarted IV heparin on 7/8, as oozing from the leg has improved and hemoglobin stable at 9.7 7/9 abdominal distention, and abd film shows gas filled small bowel loops, recommended to get OOB and ambulate.  Fevers on and off over the 24 to 36 hrs, blood culture, CXR, UA, and CT abdomen ordered .  7/10 work up revealed CT abd showing cholelithiasis with fingings concerning for early acute cholecystitis but currently he is asymptomatic. US abdomen ordered.    7/11 US abdomen shows a gall stone in the gall bladder neck but he remains asymptomatic at this time . Discussed with Dr Duke Salvia  And schedule for cardiac catheterization tomorrow if patient agrees.   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  -Case taken over by Dr. Arbie Cookey after patient requested a second opinion.  -s/p closure of lateral  fasciotomy site and placement of VAC on medial fasciotomy site -Plastic surgery consulted by vascular for possible STSG to medial fasciotomy site, but patient refusing any further surgical procedures.  -Wound vac removed (medial) and  Daily hydrogel application and wet-to-dry dressings - 7/4 leg wound continues to ooze blood and dressing changed several times and Dr  Early contacted and IV heparin was discontinued.  - 7/6 his hemoglobin dropped to 5 and he received 3 units of prbc transfusion. Repeat hemoglobin improved .  - 7/8 heparin restarted as his hemoglobin is stable and oozing from the wound has decreased.  7/10 no changes in management.   Symptomatic anemia/ Postoperative anemia/ Anemia secondary to blood loss -During hospitalization, hemoglobin has dropped to as low as 4.7 , continues to drop , requiring many prbc transfusions.  -Has received a total of 10 u PRBCs this admission -Hemoglobin 5 on 7/6 and he received 3 units of prbc transfusion and the repeat hemoglobin is 10-9.  - anemia from blood loss from continuous oozing from the leg wound. IV heparin was held on 7/5.  - restarted IV heparin on 7/8, as oozing from leg wound improved. - transfuse to keep hemoglobin greater than 8.      Syncope and orthostasis -Likely secondary to blood loss -Continue to monitor closely  Anterior subendocardial MI (HCC)/Abnormal EKG/Elevated troponin -On therapeutic dose heparin, which was held on 7/5 for continuous oozing from the leg wound. Restarted IV heparin on 7/8 as his oozing from the leg wound decreased and his hemoglobin is stable.  -Cardiology consulted and appreciated -ACE-I to be started when BP able to tolerate -Will eventually need cardiac cath, possibly schedule tomorrow .   Newly diagnosed diabetes mellitus/Hyperglycemia -Hemoglobin A1c 9.2.  -Continue lantus, ISS and CBG monitoring.  -Diabetes coordinator consulted - CBG (last 3)   Recent Labs  03/07/16 2213 03/08/16 1001 03/08/16 1256  GLUCAP 91 104* 137*   Resume SSI.    Hyponatremia - much improved.  Get serum osmolarity is 273 , urine osmo, TSH and am cortisol normal limits. .  -Continue to monitor BMP intermittently.  Abdominal distention, and constipation:  patient  reported abdominal pain and feeling bloated. AXR shows air filled small bowels, probably from  small bowel dysmotility. Recommended he get out of bed and ambulate which will help relieve the gas, and help with constipation.   Fevers on and OFF  associated with chills, none today.  Initially thought to be from blood transfusions, . Septic work up including   Blood cultures ordered, CXR, UA, and CT abd and pelvis without contrast ordered for further evaluation. CT abd shows cholelithiasis with fingings concerning for early acute cholecystitis.  US abdomen ordered, which showed mildly dilated gall bladder, with a  9 mm gall bladder stone impacted in the necko f the gall bladder, no pericholecystic fluid no wall thickening. Negative murphy's sign. He is currently asymptomatic. Monitor. No further intervention needed at this time.  Discussed with the patient and family.   Constipation: Resolved after enema .     DVT Prophylaxis  IV heparin  restarted. .   Code Status: Full  Family Communication: Son at bedside, discussed the plan of care with the patient and son.   Disposition Plan:  Pending further management.  Consultants Cardiology  Vascular Surgery Plastic surgery  Procedures  Right femoral embolectomy Right calf four compartment fasciotomies  Evacuation of hematoma from bilateral right calf fasciotomies Placement of negative pressure dressings x2 Closure of lateral fasciotomy site  and placement of VAC on medial fasciotomy site  Antibiotics   Anti-infectives    Start     Dose/Rate Route Frequency Ordered Stop   02/26/16 0930  [MAR Hold]  cefUROXime (ZINACEF) 1.5 g in dextrose 5 % 50 mL IVPB     (MAR Hold since 02/26/16 0928)   1.5 g 100 mL/hr over 30 Minutes Intravenous To ShortStay Surgical 02/25/16 1055 02/26/16 1120   02/22/16 0800  cefUROXime (ZINACEF) 1.5 g in dextrose 5 % 50 mL IVPB     1.5 g 100 mL/hr over 30 Minutes Intravenous Every 12 hours 02/21/16 2115 02/22/16 2140   02/22/16 0600  cefUROXime (ZINACEF) injection 1.5 g     1.5 g Intramuscular On call to  O.R. 02/21/16 1523 02/21/16 1713      Subjective:   Troy Yu seen and examined today, sitting int he chair, in denial about his cardiac issues, he feels everything is related to his constipation. His son at bedside. No new complaints.  Discussed cardiac cath.   Objective:   Filed Vitals:   03/08/16 1019 03/08/16 1200 03/08/16 1253 03/08/16 1300  BP: 113/63  87/73 101/63  Pulse: 80 81 78   Temp:   97.9 F (36.6 C)   TempSrc:   Oral   Resp:  26 26   Height:      Weight:      SpO2:  99% 100%     Intake/Output Summary (Last 24 hours) at 03/08/16 1329 Last data filed at 03/08/16 0800  Gross per 24 hour  Intake  696.5 ml  Output   2475 ml  Net -1778.5 ml   Filed Weights   03/06/16 0500 03/07/16 0330 03/08/16 0500  Weight: 99.8 kg (220 lb 0.3 oz) 94.3 kg (207 lb 14.3 oz) 95 kg (209 lb 7 oz)    Exam  General: Well developed, well nourished,no distress, comfortable sittin gin the chair using his laptop.   HEENT: NCAT, mucous membranes moist.   Cardiovascular: S1 S2 auscultated, no murmurs, RRR  Respiratory: Clear to auscultation bilaterally with equal chest rise.  Abdomen: firm, distended, mild generalized tenderness. Bowel sounds heard.   Extremities: warm dry without cyanosis clubbing or edema. RLE wrapped, improved oozing from the leg wound.   Neuro: AAOx3, frustrated , nonfocal     Data Reviewed: I have personally reviewed following labs and imaging studies  CBC:  Recent Labs Lab 03/05/16 0458 03/05/16 2345 03/06/16 0410 03/07/16 0808 03/08/16 0317  WBC 15.1* 12.3* 15.2* 11.4* 8.5  HGB 9.7* 9.4* 8.9* 8.4* 8.7*  HCT 29.3* 29.6* 27.8* 25.7* 26.6*  MCV 91.6 91.9 92.4 90.8 92.0  PLT 273 345 345 425* 431*   Basic Metabolic Panel:  Recent Labs Lab 03/05/16 0458 03/05/16 2345 03/06/16 0410 03/07/16 0808 03/08/16 0317  NA 132* 132* 130* 136 135  K 3.8 4.9 3.7 3.5 3.5  CL 103 103 101 105 107  CO2 23 25 22 25 22   GLUCOSE 148* 198* 197* 96  104*  BUN 9 11 11  5* 5*  CREATININE 0.85 0.88 0.85 0.83 0.83  CALCIUM 7.7* 7.3* 7.3* 7.6* 7.3*   GFR: Estimated Creatinine Clearance: 111 mL/min (by C-G formula based on Cr of 0.83). Liver Function Tests:  Recent Labs Lab 03/05/16 2345  AST 66*  ALT 16*  ALKPHOS 61  BILITOT 1.9*  PROT 5.2*  ALBUMIN 1.6*    Recent Labs Lab 03/05/16 2345  LIPASE 58*   No results for input(s): AMMONIA in the last 168 hours.  Coagulation Profile: No results for input(s): INR, PROTIME in the last 168 hours. Cardiac Enzymes: No results for input(s): CKTOTAL, CKMB, CKMBINDEX, TROPONINI in the last 168 hours. BNP (last 3 results) No results for input(s): PROBNP in the last 8760 hours. HbA1C: No results for input(s): HGBA1C in the last 72 hours. CBG:  Recent Labs Lab 03/07/16 1257 03/07/16 1641 03/07/16 2213 03/08/16 1001 03/08/16 1256  GLUCAP 105* 138* 91 104* 137*   Lipid Profile: No results for input(s): CHOL, HDL, LDLCALC, TRIG, CHOLHDL, LDLDIRECT in the last 72 hours. Thyroid Function Tests: No results for input(s): TSH, T4TOTAL, FREET4, T3FREE, THYROIDAB in the last 72 hours. Anemia Panel: No results for input(s): VITAMINB12, FOLATE, FERRITIN, TIBC, IRON, RETICCTPCT in the last 72 hours. Urine analysis:    Component Value Date/Time   COLORURINE YELLOW 03/06/2016 1500   APPEARANCEUR CLEAR 03/06/2016 1500   LABSPEC 1.008 03/06/2016 1500   PHURINE 6.5 03/06/2016 1500   GLUCOSEU NEGATIVE 03/06/2016 1500   HGBUR NEGATIVE 03/06/2016 1500   BILIRUBINUR NEGATIVE 03/06/2016 1500   KETONESUR NEGATIVE 03/06/2016 1500   PROTEINUR NEGATIVE 03/06/2016 1500   NITRITE NEGATIVE 03/06/2016 1500   LEUKOCYTESUR NEGATIVE 03/06/2016 1500   Sepsis Labs: @LABRCNTIP (procalcitonin:4,lacticidven:4)  ) Recent Results (from the past 240 hour(s))  Culture, blood (Routine X 2) w Reflex to ID Panel     Status: None (Preliminary result)   Collection Time: 03/06/16 10:40 AM  Result Value Ref Range  Status   Specimen Description BLOOD BLOOD LEFT ARM  Final   Special Requests IN PEDIATRIC BOTTLE 1CC  Final   Culture NO GROWTH < 24 HOURS  Final   Report Status PENDING  Incomplete  Culture, blood (Routine X 2) w Reflex to ID Panel     Status: None (Preliminary result)   Collection Time: 03/06/16 10:45 AM  Result Value Ref Range Status   Specimen Description BLOOD BLOOD LEFT ARM  Final   Special Requests IN PEDIATRIC BOTTLE 1CC  Final   Culture NO GROWTH < 24 HOURS  Final   Report Status PENDING  Incomplete      Radiology Studies: Dg Chest 2 View  03/06/2016  CLINICAL DATA:  Fever EXAM: CHEST  2 VIEW COMPARISON:  February 26, 2016 FINDINGS: There is slight atelectasis in the left lower lobe. The lungs elsewhere are clear. Heart is upper normal in size with pulmonary vascularity within normal limits. There is prominence of the mediastinum at the level of the aortic arch. No adenopathy. No bone lesions. IMPRESSION: Mild left base atelectasis. No edema or consolidation. Heart is upper normal in size. Note that there is prominence of the mediastinum at the level of the aortic arch. The significance of this finding is uncertain. This finding may warrant contrast enhanced chest CT to evaluate the aorta in this region. These results will be called to the ordering clinician or representative by the Radiologist Assistant, and communication documented in the PACS or zVision Dashboard. Electronically Signed   By: Bretta Bang III M.D.   On: 03/06/2016 14:26   Ct Chest W Contrast  03/06/2016  CLINICAL DATA:  55 year old male with history of right-sided abdominal pain. Substernal chest pain 2 weeks ago lasting for 48 hours. EXAM: CT CHEST, ABDOMEN, AND PELVIS WITH CONTRAST TECHNIQUE: Multidetector CT imaging of the chest, abdomen and pelvis was performed following the standard protocol during bolus administration of intravenous contrast. CONTRAST:  100 mL of Isovue-300. COMPARISON:  No priors. FINDINGS: CT  CHEST FINDINGS Mediastinum/Lymph Nodes: Heart size is enlarged.  There is no significant pericardial fluid, thickening or pericardial calcification. 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 Image 29 of series 2 demonstrates a small 7 mm filling defect in the apex of the left ventricle, compatible with apical thrombus. There is no significant pericardial fluid, thickening or pericardial calcification. No pathologically enlarged mediastinal or hilar lymph nodes. No aneurysm or dissection of the thoracic aorta. Esophagus is unremarkable in appearance. No axillary lymphadenopathy. Lungs/Pleura: Linear areas of scarring and/or subsegmental atelectasis are noted in the lower lobes of the lungs bilaterally. Trace bilateral pleural effusions. No confluent consolidative airspace disease. No definite suspicious appearing pulmonary nodules or masses are noted (assessment is limited by considerable patient respiratory motion). Musculoskeletal/Soft Tissues: There are no aggressive appearing lytic or blastic lesions noted in the visualized portions of the skeleton. CT ABDOMEN AND PELVIS FINDINGS Hepatobiliary: No cystic or solid hepatic lesions. No intra or extrahepatic biliary ductal dilatation. Small calcified gallstones are noted dependently in the gallbladder, measuring up to 7 mm in diameter. Gallbladder wall appears mildly thickened, and there are subtle pericholecystic inflammatory changes. Gallbladder is moderately distended. Pancreas: No pancreatic mass. No pancreatic ductal dilatation. No pancreatic or peripancreatic fluid or inflammatory changes. Spleen: Unremarkable. Adrenals/Urinary Tract: Bilateral adrenal glands and bilateral kidneys are normal in appearance. No hydroureteronephrosis. Urinary bladder is  normal in appearance. Stomach/Bowel: The appearance of the stomach is normal. There is no pathologic dilatation of small bowel or colon. Normal appendix. Vascular/Lymphatic: No significant atherosclerotic disease, aneurysm or dissection identified in the abdominal or pelvic vasculature. There is some intermediate to high attenuation fluid adjacent to the right common femoral vessels, presumably a postprocedural hematoma from recent catheterization procedure. No findings to suggest acute active extravasation at this time. No lymphadenopathy noted in the abdomen or pelvis. Reproductive: Prostate gland and seminal vesicles are unremarkable in appearance. Other: No significant volume of ascites.  No pneumoperitoneum. Musculoskeletal: There are no aggressive appearing lytic or blastic lesions noted in the visualized portions of the skeleton. IMPRESSION: 1. Cholelithiasis with findings concerning for potential early acute cholecystitis. This should be further evaluated with right upper quadrant abdominal ultrasound if there is clinical concern for acute cholecystitis. 2. The patient continues to have a small volume of apical thrombus in the left ventricle. 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 3. Areas of scarring and/or subsegmental atelectasis in the lower lobes of the lungs bilaterally. 4. Trace bilateral pleural effusions lying dependently. 5. Resolving postprocedural hematoma adjacent to the right common femoral vessels, likely from prior catheterization procedure. No findings to suggest acute active extravasation at this time. 6. Additional incidental findings, as above. These results will be called to the ordering clinician or representative by the Radiologist Assistant, and communication documented in the PACS or zVision Dashboard. Electronically Signed   By: Trudie Reed M.D.   On:  03/06/2016 18:20   US Abdomen Complete  03/08/2016  CLINICAL DATA:  No gallstones, 3 days of abdominal pain, elevated white blood cell count. EXAM: ABDOMEN ULTRASOUND COMPLETE COMPARISON:  CT scan of the abdomen pelvis of March 06, 2016 FINDINGS: Gallbladder: The gallbladder is adequately distended. In the gallbladder neck there is a 9 mm echogenic shadowing focus consistent with an impacted stone. There is sludge present. There is no gallbladder  wall thickening or pericholecystic fluid. Common bile duct: Diameter: 4.9 mm Liver: The hepatic echotexture is mildly increased diffusely. There is no discrete mass or intrahepatic ductal dilation. IVC: No abnormality visualized. Pancreas: Bowel gas obscured the pancreas. Spleen: Size and appearance within normal limits. Right Kidney: Length: 13 cm. Echogenicity within normal limits. No mass or hydronephrosis visualized. Left Kidney: Length: 13.2 cm. Echogenicity within normal limits. No mass or hydronephrosis visualized. Abdominal aorta: Bowel gas limited evaluation of the abdominal aorta. The maximal diameter observed is 2.5 cm proximally Other findings: No ascites is observed. There is a left pleural effusion. IMPRESSION: 1. Mildly distended gallbladder with a 9 mm stone impacted in the gallbladder neck. No significant gallbladder wall thickening and no pericholecystic fluid is observed. There is no positive sonographic Murphy's sign. 2. Fatty infiltrative change of the liver. 3. No acute abnormality observed elsewhere within the abdomen. 4. Left pleural effusion. Electronically Signed   By: David  [place] M.D.   On: 03/08/2016 07:07   Ct Abdomen Pelvis W Contrast  03/06/2016  CLINICAL DATA:  55 year old male with history of right-sided abdominal pain. Substernal chest pain 2 weeks ago lasting for 48 hours. EXAM: CT CHEST, ABDOMEN, AND PELVIS WITH CONTRAST TECHNIQUE: Multidetector CT imaging of the chest, abdomen and pelvis was performed following the standard protocol  during bolus administration of intravenous contrast. CONTRAST:  100 mL of Isovue-300. COMPARISON:  No priors. FINDINGS: CT CHEST FINDINGS Mediastinum/Lymph Nodes: Heart size is enlarged. There is no significant pericardial fluid, thickening or pericardial calcification. 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 Image 29 of series 2 demonstrates a small 7 mm filling defect in the apex of the left ventricle, compatible with apical thrombus. There is no significant pericardial fluid, thickening or pericardial calcification. No pathologically enlarged mediastinal or hilar lymph nodes. No aneurysm or dissection of the thoracic aorta. Esophagus is unremarkable in appearance. No axillary lymphadenopathy. Lungs/Pleura: Linear areas of scarring and/or subsegmental atelectasis are noted in the lower lobes of the lungs bilaterally. Trace bilateral pleural effusions. No confluent consolidative airspace disease. No definite suspicious appearing pulmonary nodules or masses are noted (assessment is limited by considerable patient respiratory motion). Musculoskeletal/Soft Tissues: There are no aggressive appearing lytic or blastic lesions noted in the visualized portions of the skeleton. CT ABDOMEN AND PELVIS FINDINGS Hepatobiliary: No cystic or solid hepatic lesions. No intra or extrahepatic biliary ductal dilatation. Small calcified gallstones are noted dependently in the gallbladder, measuring up to 7 mm in diameter. Gallbladder wall appears mildly thickened, and there are subtle pericholecystic inflammatory changes. Gallbladder is moderately distended. Pancreas: No pancreatic mass. No pancreatic ductal dilatation. No pancreatic or peripancreatic fluid or inflammatory changes. Spleen: Unremarkable. Adrenals/Urinary  Tract: Bilateral adrenal glands and bilateral kidneys are normal in appearance. No hydroureteronephrosis. Urinary bladder is normal in appearance. Stomach/Bowel: The appearance of the stomach is normal. There is no pathologic dilatation of small bowel or colon. Normal appendix. Vascular/Lymphatic: No significant atherosclerotic disease, aneurysm or dissection identified in the abdominal or pelvic vasculature. There is some intermediate to high attenuation fluid adjacent to the right common femoral vessels, presumably a postprocedural hematoma from recent catheterization procedure. No findings to suggest acute active extravasation at this time. No lymphadenopathy noted in the abdomen or pelvis. Reproductive: Prostate gland and seminal vesicles are unremarkable in appearance. Other: No significant volume of ascites.  No pneumoperitoneum.  Musculoskeletal: There are no aggressive appearing lytic or blastic lesions noted in the visualized portions of the skeleton. IMPRESSION: 1. Cholelithiasis with findings concerning for potential early acute cholecystitis. This should be further evaluated with right upper quadrant abdominal ultrasound if there is clinical concern for acute cholecystitis. 2. The patient continues to have a small volume of apical thrombus in the left ventricle. 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 3. Areas of scarring and/or subsegmental atelectasis in the lower lobes of the lungs bilaterally. 4. Trace bilateral pleural effusions lying dependently. 5. Resolving postprocedural hematoma adjacent to the right common femoral vessels, likely from prior catheterization procedure. No findings to suggest acute active extravasation at this time. 6. Additional incidental findings, as above. These results will be called to the ordering clinician or representative by the Radiologist Assistant,  and communication documented in the PACS or zVision Dashboard. Electronically Signed   By: Trudie Reed M.D.   On: 03/06/2016 18:20     Scheduled Meds: . aspirin EC  81 mg Oral Daily  . atorvastatin  80 mg Oral q1800  . docusate sodium  100 mg Oral Daily  . insulin aspart  0-15 Units Subcutaneous TID WC  . insulin aspart  0-5 Units Subcutaneous QHS  . insulin aspart  6 Units Subcutaneous TID WC  . insulin glargine  20 Units Subcutaneous QHS  . pantoprazole  40 mg Oral BID AC  . propranolol  10 mg Oral TID   Continuous Infusions: . heparin 1,300 Units/hr (03/08/16 0200)  . nitroGLYCERIN 10 mcg/min (03/07/16 1900)     LOS: 16 days   Time Spent in minutes   30 minutes  Mylin Hirano D.O. on 03/08/2016 at 1:29 PM  [phone]  After 7pm go to [email or web address] - password [email or web address]  And look for the night coverage person covering for me after hours  [facility]  [phone]

## 2016-03-08 NOTE — Progress Notes (Signed)
ANTICOAGULATION CONSULT NOTE  Pharmacy Consult for Heparin  Indication:  LV thrombus and R femoral embolism s/p embolectomy  No Known Allergies  Patient Measurements: Height: 5\' 8"  (172.7 cm) Weight: 209 lb 7 oz (95 kg) IBW/kg (Calculated) : 68.4  Heparin dosing wt: 87 kg  Labs:  Recent Labs  03/06/16 0410 03/06/16 1249 03/07/16 0808 03/08/16 0317  HGB 8.9*  --  8.4* 8.7*  HCT 27.8*  --  25.7* 26.6*  PLT 345  --  425* 431*  HEPARINUNFRC 0.21* 0.35 0.44 0.34  CREATININE 0.85  --  0.83 0.83   Estimated Creatinine Clearance: 111 mL/min (by C-G formula based on Cr of 0.83).  Assessment: 55 yo male with LV thrombus s/p R femoral embolectomy and subsequent hematoma evacuation, for heparin.  His heparin was turned off due to complications of bleeding but resumed on 7/8.    HL this AM remains therapeutic at 0.34 on heparin 1300 units/hr. Hgb stable. No issues with infusion or bleeding noted.  Goal of Therapy:  Heparin level 0.3-0.5 units/ml  Monitor platelets by anticoagulation protocol: Yes   Plan:  Continue Heparin 1300 units/hr Daily HL/CBC Monitor closely for bleeding complications  Arlean Hopping. Newman Pies, PharmD, BCPS Clinical Pharmacist Pager [phone] 03/08/2016 9:54 AM

## 2016-03-08 NOTE — Progress Notes (Addendum)
PATIENT ID: [name] is a 56M with diabetes here with subacute anterior MI complicated by LV thrombus and right femoral artery embolism now s/p embolectomy and R calf fasciotomy for compartment syndrome.  This was complicated by post operative anemia.  Wound vac now removed and patient refusing further intervention by plastic surgery.    SUBJECTIVE:  Feeling well.  Denies chest pain or shortness of breath.     PHYSICAL EXAM Filed Vitals:   03/08/16 1019 03/08/16 1200 03/08/16 1253 03/08/16 1300  BP: 113/63  87/73 101/63  Pulse: 80 81 78   Temp:   97.9 F (36.6 C)   TempSrc:   Oral   Resp:  26 26   Height:      Weight:      SpO2:  99% 100%    General:  Well-appearing.  No acute distress. Neck: No JVD Lungs:  CTAB.  No crackles, rhonchi or wheezes Heart:  RRR.  No m/r/g.  Normal S1/S2.   Abdomen:  Soft, NT, ND.  +BS Extremities:  R LE dressing in place.  No LLE edema   LABS: Lab Results  Component Value Date   TROPONINI 0.85* 02/22/2016   Results for orders placed or performed during the hospital encounter of 02/21/16 (from the past 24 hour(s))  Glucose, capillary     Status: Abnormal   Collection Time: 03/07/16  4:41 PM  Result Value Ref Range   Glucose-Capillary 138 (H) 65 - 99 mg/dL   Comment 1 Capillary Specimen   Glucose, capillary     Status: None   Collection Time: 03/07/16 10:13 PM  Result Value Ref Range   Glucose-Capillary 91 65 - 99 mg/dL   Comment 1 Capillary Specimen   Basic metabolic panel     Status: Abnormal   Collection Time: 03/08/16  3:17 AM  Result Value Ref Range   Sodium 135 135 - 145 mmol/L   Potassium 3.5 3.5 - 5.1 mmol/L   Chloride 107 101 - 111 mmol/L   CO2 22 22 - 32 mmol/L   Glucose, Bld 104 (H) 65 - 99 mg/dL   BUN 5 (L) 6 - 20 mg/dL   Creatinine, Ser 6.01 0.61 - 1.24 mg/dL   Calcium 7.3 (L) 8.9 - 10.3 mg/dL   GFR calc non Af Amer >60 >60 mL/min   GFR calc Af Amer >60 >60 mL/min   Anion gap 6 5 - 15  CBC     Status: Abnormal     Collection Time: 03/08/16  3:17 AM  Result Value Ref Range   WBC 8.5 4.0 - 10.5 K/uL   RBC 2.89 (L) 4.22 - 5.81 MIL/uL   Hemoglobin 8.7 (L) 13.0 - 17.0 g/dL   HCT 09.3 (L) 23.5 - 57.3 %   MCV 92.0 78.0 - 100.0 fL   MCH 30.1 26.0 - 34.0 pg   MCHC 32.7 30.0 - 36.0 g/dL   RDW 22.0 25.4 - 27.0 %   Platelets 431 (H) 150 - 400 K/uL  Heparin level (unfractionated)     Status: None   Collection Time: 03/08/16  3:17 AM  Result Value Ref Range   Heparin Unfractionated 0.34 0.30 - 0.70 IU/mL  Glucose, capillary     Status: Abnormal   Collection Time: 03/08/16 10:01 AM  Result Value Ref Range   Glucose-Capillary 104 (H) 65 - 99 mg/dL   Comment 1 Capillary Specimen   Glucose, capillary     Status: Abnormal   Collection Time: 03/08/16 12:56 PM  Result Value Ref Range   Glucose-Capillary 137 (H) 65 - 99 mg/dL    Intake/Output Summary (Last 24 hours) at 03/08/16 1536 Last data filed at 03/08/16 0800  Gross per 24 hour  Intake  524.5 ml  Output   1825 ml  Net -1300.5 ml    Telemetry:  No events  Echo 02/21/16: Study Conclusions  - Left ventricle: Anteroapical infarct with large thrombus burden  at apex. Mobile and high embolic potential. The cavity size was  severely dilated. Wall thickness was normal. Systolic function  was moderately to severely reduced. The estimated ejection  fraction was in the range of 30% to 35%. - Atrial septum: No defect or patent foramen ovale was identified.   ASSESSMENT AND PLAN:  Principal Problem:   LV (left ventricular) mural thrombus (HCC) Active Problems:   Anterior subendocardial MI (HCC)   Right leg pain   Non-STEMI (non-ST elevated myocardial infarction) (HCC)   Elevated troponin   Abnormal EKG   Diabetes (HCC)   Acute MI (HCC)   History of embolectomy   Pain   Thromboembolism (HCC)   Vascular occlusion   Fever   NSTEMI (non-ST elevated myocardial infarction) (HCC)   Chest pain   Abdominal pain    # Subacute anterior MI:  Mr. Paula Libra echo is consistent with an anterior MI.  H/H is now stable and no additional surgeries are planned at this time.  He has been placed on the cath board for [facility] tomorrow.  Continue aspirin and propranolol.  Unclear why this beta blocker was chosen.  Atorvastatin was started this admission.  Insight remains poor.  He continues to think this was caused by dehydration and being in hot weather.  Father had a CABG in his 45s.  Discussed lifestyle modification.   Risks and benefits of cardiac catheterization have been discussed with the patient.  The patient understands that risks included but are not limited to stroke (1 in 1000), death (1 in 1000), kidney failure [usually temporary] (1 in 500), bleeding (1 in 200), allergic reaction [possibly serious] (1 in 200). The patient understands and agrees to proceed.   # LV thrombus: Continue heparin.  Will transition to warfarin after cath.  # R LE embolism s/p embolectomy and fasciotomy: Per Vascular Surgery.    # Post-operative anemia: Stable.  Last transfusion 03/03/16.  Time spent: 35 minutes-Greater than 50% of this time was spent in counseling, explanation of diagnosis, planning of further management, and coordination of care.  Keno Caraway C. Duke Salvia, MD, [facility] 03/08/2016 3:36 PM

## 2016-03-08 NOTE — Progress Notes (Signed)
Report called to receiving unit,   Spoke with Caralyn Guile . Pt ready to be transfer tele bed on  2 W RM  19

## 2016-03-08 NOTE — Progress Notes (Signed)
Abdl US done-pt was made NPO 6 hrs prior.pt denied any abdl discomfort since after BM yest AM.

## 2016-03-09 ENCOUNTER — Encounter (HOSPITAL_COMMUNITY)
Admission: EM | Disposition: A | Discharge status: Home Health | Payer: Self-pay | Source: Home / Self Care | Attending: Internal Medicine

## 2016-03-09 ENCOUNTER — Encounter (HOSPITAL_COMMUNITY): Payer: Self-pay | Admitting: Cardiovascular Disease

## 2016-03-09 DIAGNOSIS — R079 Chest pain, unspecified: Secondary | ICD-10-CM

## 2016-03-09 DIAGNOSIS — D649 Anemia, unspecified: Secondary | ICD-10-CM

## 2016-03-09 DIAGNOSIS — R109 Unspecified abdominal pain: Secondary | ICD-10-CM

## 2016-03-09 DIAGNOSIS — I2510 Atherosclerotic heart disease of native coronary artery without angina pectoris: Secondary | ICD-10-CM

## 2016-03-09 HISTORY — PX: CARDIAC CATHETERIZATION: SHX172

## 2016-03-09 LAB — GLUCOSE, CAPILLARY
GLUCOSE-CAPILLARY: 93 mg/dL (ref 65–99)
GLUCOSE-CAPILLARY: 168 mg/dL — AB (ref 65–99)
Glucose-Capillary: 95 mg/dL (ref 65–99)
Glucose-Capillary: 144 mg/dL — ABNORMAL HIGH (ref 65–99)

## 2016-03-09 LAB — CBC
HEMATOCRIT: 27.4 % — AB (ref 39.0–52.0)
HEMOGLOBIN: 8.9 g/dL — AB (ref 13.0–17.0)
MCH: 29.5 pg (ref 26.0–34.0)
MCHC: 32.5 g/dL (ref 30.0–36.0)
MCV: 90.7 fL (ref 78.0–100.0)
Platelets: 447 10*3/uL — ABNORMAL HIGH (ref 150–400)
RBC: 3.02 MIL/uL — ABNORMAL LOW (ref 4.22–5.81)
RDW: 15.1 % (ref 11.5–15.5)
WBC: 6.7 10*3/uL (ref 4.0–10.5)

## 2016-03-09 LAB — PROTIME-INR
INR: 1.44 (ref 0.00–1.49)
PROTHROMBIN TIME: 17.6 s — AB (ref 11.6–15.2)

## 2016-03-09 LAB — HEPARIN LEVEL (UNFRACTIONATED): HEPARIN UNFRACTIONATED: 0.47 [IU]/mL (ref 0.30–0.70)

## 2016-03-09 LAB — POCT ACTIVATED CLOTTING TIME
Activated Clotting Time: 213 seconds
Activated Clotting Time: 230 seconds

## 2016-03-09 SURGERY — CORONARY STENT INTERVENTION

## 2016-03-09 MED ORDER — SODIUM CHLORIDE 0.9 % IV SOLN: INTRAVENOUS | Status: AC

## 2016-03-09 MED ORDER — HEPARIN (PORCINE) IN NACL 2-0.9 UNIT/ML-% IJ SOLN
INTRAMUSCULAR | Status: AC
  Filled 2016-03-09: qty 500

## 2016-03-09 MED ORDER — SODIUM CHLORIDE 0.9 % IV SOLN: 250.0000 mL | INTRAVENOUS | Status: DC | PRN

## 2016-03-09 MED ORDER — HEPARIN SODIUM (PORCINE) 1000 UNIT/ML IJ SOLN
INTRAMUSCULAR | Status: DC | PRN
  Administered 2016-03-09: 4000 [IU] via INTRAVENOUS
  Administered 2016-03-09: 2000 [IU] via INTRAVENOUS
  Administered 2016-03-09: 5000 [IU] via INTRAVENOUS

## 2016-03-09 MED ORDER — FENTANYL CITRATE (PF) 100 MCG/2ML IJ SOLN
INTRAMUSCULAR | Status: AC
  Filled 2016-03-09: qty 2

## 2016-03-09 MED ORDER — CLOPIDOGREL BISULFATE 300 MG PO TABS
ORAL_TABLET | ORAL | Status: DC | PRN
  Administered 2016-03-09: 600 mg via ORAL

## 2016-03-09 MED ORDER — IOPAMIDOL (ISOVUE-370) INJECTION 76%
INTRAVENOUS | Status: AC
  Filled 2016-03-09: qty 100

## 2016-03-09 MED ORDER — CARVEDILOL 6.25 MG PO TABS
6.2500 mg | ORAL_TABLET | Freq: Two times a day (BID) | ORAL | Status: DC
Start: 2016-03-09 — End: 2016-03-16
  Administered 2016-03-09 – 2016-03-16 (×15): 6.25 mg via ORAL
  Filled 2016-03-09 (×5): qty 1
  Filled 2016-03-09: qty 2
  Filled 2016-03-09 (×6): qty 1
  Filled 2016-03-09: qty 2
  Filled 2016-03-09: qty 1
  Filled 2016-03-09: qty 2

## 2016-03-09 MED ORDER — MIDAZOLAM HCL 2 MG/2ML IJ SOLN
INTRAMUSCULAR | Status: AC
  Filled 2016-03-09: qty 2

## 2016-03-09 MED ORDER — WARFARIN - PHARMACIST DOSING INPATIENT
Freq: Every day | Status: DC
  Administered 2016-03-09: 21:00:00
  Administered 2016-03-14 – 2016-03-16 (×2): 1

## 2016-03-09 MED ORDER — IOPAMIDOL (ISOVUE-370) INJECTION 76%
INTRAVENOUS | Status: DC | PRN
  Administered 2016-03-09: 185 mL via INTRA_ARTERIAL

## 2016-03-09 MED ORDER — HEPARIN (PORCINE) IN NACL 100-0.45 UNIT/ML-% IJ SOLN
1300.0000 [IU]/h | INTRAMUSCULAR | Status: DC
  Administered 2016-03-09: 23:00:00 1300 [IU]/h via INTRAVENOUS
  Filled 2016-03-09 (×2): qty 250

## 2016-03-09 MED ORDER — CLOPIDOGREL BISULFATE 75 MG PO TABS
75.0000 mg | ORAL_TABLET | Freq: Every day | ORAL | Status: DC
Start: 2016-03-10 — End: 2016-03-16
  Administered 2016-03-10 – 2016-03-16 (×7): 75 mg via ORAL
  Filled 2016-03-09 (×7): qty 1

## 2016-03-09 MED ORDER — WARFARIN SODIUM 7.5 MG PO TABS
7.5000 mg | ORAL_TABLET | Freq: Once | ORAL | Status: AC
  Administered 2016-03-09: 7.5 mg via ORAL
  Filled 2016-03-09: qty 1

## 2016-03-09 MED ORDER — ADENOSINE (DIAGNOSTIC) 140MCG/KG/MIN
INTRAVENOUS | Status: DC | PRN
  Administered 2016-03-09: 140 ug/kg/min via INTRAVENOUS

## 2016-03-09 MED ORDER — SODIUM CHLORIDE 0.9 % IV SOLN: INTRAVENOUS | Status: DC

## 2016-03-09 MED ORDER — VERAPAMIL HCL 2.5 MG/ML IV SOLN
INTRAVENOUS | Status: DC | PRN
  Administered 2016-03-09: 11:00:00 via INTRA_ARTERIAL

## 2016-03-09 MED ORDER — IOPAMIDOL (ISOVUE-370) INJECTION 76%
INTRAVENOUS | Status: AC
  Filled 2016-03-09: qty 50

## 2016-03-09 MED ORDER — SODIUM CHLORIDE 0.9% FLUSH: 3.0000 mL | INTRAVENOUS | Status: DC | PRN

## 2016-03-09 MED ORDER — SODIUM CHLORIDE 0.9% FLUSH
3.0000 mL | Freq: Two times a day (BID) | INTRAVENOUS | Status: DC
  Administered 2016-03-09 – 2016-03-16 (×9): 3 mL via INTRAVENOUS

## 2016-03-09 MED ORDER — NITROGLYCERIN 1 MG/10 ML FOR IR/CATH LAB
INTRA_ARTERIAL | Status: DC | PRN
  Administered 2016-03-09: 200 ug via INTRACORONARY

## 2016-03-09 MED ORDER — MIDAZOLAM HCL 2 MG/2ML IJ SOLN
INTRAMUSCULAR | Status: DC | PRN
  Administered 2016-03-09: 1 mg via INTRAVENOUS

## 2016-03-09 MED ORDER — ASPIRIN 81 MG PO CHEW
81.0000 mg | CHEWABLE_TABLET | ORAL | Status: AC
  Administered 2016-03-09: 81 mg via ORAL
  Filled 2016-03-09: qty 1

## 2016-03-09 MED ORDER — NITROGLYCERIN 1 MG/10 ML FOR IR/CATH LAB
INTRA_ARTERIAL | Status: AC
  Filled 2016-03-09: qty 10

## 2016-03-09 MED ORDER — CLOPIDOGREL BISULFATE 300 MG PO TABS
ORAL_TABLET | ORAL | Status: AC
  Filled 2016-03-09: qty 2

## 2016-03-09 MED ORDER — HEPARIN SODIUM (PORCINE) 1000 UNIT/ML IJ SOLN
INTRAMUSCULAR | Status: AC
  Filled 2016-03-09: qty 1

## 2016-03-09 MED ORDER — VERAPAMIL HCL 2.5 MG/ML IV SOLN
INTRAVENOUS | Status: AC
  Filled 2016-03-09: qty 2

## 2016-03-09 MED ORDER — SODIUM CHLORIDE 0.9% FLUSH: 3.0000 mL | Freq: Two times a day (BID) | INTRAVENOUS | Status: DC

## 2016-03-09 MED ORDER — FENTANYL CITRATE (PF) 100 MCG/2ML IJ SOLN
INTRAMUSCULAR | Status: DC | PRN
  Administered 2016-03-09: 25 ug via INTRAVENOUS

## 2016-03-09 MED ORDER — ADENOSINE 12 MG/4ML IV SOLN
INTRAVENOUS | Status: AC
  Filled 2016-03-09: qty 16

## 2016-03-09 MED ORDER — CLOPIDOGREL BISULFATE 300 MG PO TABS
ORAL_TABLET | ORAL | Status: AC
  Filled 2016-03-09: qty 1

## 2016-03-09 MED ORDER — ASPIRIN EC 81 MG PO TBEC
81.0000 mg | DELAYED_RELEASE_TABLET | Freq: Every day | ORAL | Status: DC
  Administered 2016-03-10 – 2016-03-16 (×7): 81 mg via ORAL
  Filled 2016-03-09 (×7): qty 1

## 2016-03-09 SURGICAL SUPPLY — 21 items
BALLN TREK RX 2.5X15 (BALLOONS) ×3
BALLN ~~LOC~~ TREK RX 3.0X12 (BALLOONS) ×3
BALLN ~~LOC~~ TREK RX 3.0X15 (BALLOONS) ×3
BALLOON TREK RX 2.5X15 (BALLOONS) ×1 IMPLANT
BALLOON ~~LOC~~ TREK RX 3.0X12 (BALLOONS) ×1 IMPLANT
BALLOON ~~LOC~~ TREK RX 3.0X15 (BALLOONS) ×1 IMPLANT
CATH MICROCATH NAVVUS (MICROCATHETER) ×1 IMPLANT
CATH OPTITORQUE JACKY 4.0 5F (CATHETERS) ×3 IMPLANT
CATH VISTA GUIDE 6FR JL3.5 (CATHETERS) ×3 IMPLANT
DEVICE RAD COMP TR BAND LRG (VASCULAR PRODUCTS) ×3 IMPLANT
GLIDESHEATH SLEND SS 6F .021 (SHEATH) ×3 IMPLANT
GUIDE CATH RUNWAY 6FR FR4 (CATHETERS) ×3 IMPLANT
KIT HEART LEFT (KITS) ×3 IMPLANT
MICROCATHETER NAVVUS (MICROCATHETER) ×3
PACK CARDIAC CATHETERIZATION (CUSTOM PROCEDURE TRAY) ×3 IMPLANT
STENT PROMUS PREM MR 2.75X16 (Permanent Stent) ×3 IMPLANT
STENT PROMUS PREM MR 2.75X20 (Permanent Stent) ×3 IMPLANT
TRANSDUCER W/STOPCOCK (MISCELLANEOUS) ×6 IMPLANT
TUBING CIL FLEX 10 FLL-RA (TUBING) ×3 IMPLANT
WIRE RUNTHROUGH .014X180CM (WIRE) ×3 IMPLANT
WIRE SAFE-T 1.5MM-J .035X260CM (WIRE) ×3 IMPLANT

## 2016-03-09 NOTE — Progress Notes (Signed)
ANTICOAGULATION CONSULT NOTE  Pharmacy Consult for Heparin bridge and warfarin initiation Indication:  LV thrombus and R femoral embolism s/p embolectomy and now s/p cath with stent placement  No Known Allergies  Patient Measurements: Height: 5\' 8"  (172.7 cm) Weight: 209 lb 3.2 oz (94.892 kg) IBW/kg (Calculated) : 68.4  Heparin dosing wt: 87 kg  Labs:  Recent Labs  03/07/16 0808 03/08/16 0317 03/09/16 0412 03/09/16 0857  HGB 8.4* 8.7* 8.9*  --   HCT 25.7* 26.6* 27.4*  --   PLT 425* 431* 447*  --   LABPROT  --   --   --  17.6*  INR  --   --   --  1.44  HEPARINUNFRC 0.44 0.34 0.47  --   CREATININE 0.83 0.83  --   --    Estimated Creatinine Clearance: 111 mL/min (by C-G formula based on Cr of 0.83).  Assessment: 55 yo male with LV thrombus s/p R femoral embolectomy and subsequent hematoma evacuation, for heparin.  His heparin was turned off due to complications of bleeding but resumed on 7/8.  Pt is now s/p cath with stent placement. Pharmacy consulted to restart heparin 8 hours post sheath removal and to start patient on warfarin.  Heparin has been therapeutic on 1300 units/hr. CBC stable. Of note:   12:04:34 03/09/16 Sheath Removal    Goal of Therapy:  Heparin level 0.3-0.5 units/ml  INR 2-3 Monitor platelets by anticoagulation protocol: Yes   Plan:  8 hours post sheath removal, restart Heparin at 1300 units/hr at [facility] warfarin 7.5 mg x 1 dose Check anti-Xa level in 6 hours and daily while on heparin Monitor daily INR, CBC, clinical course, s/sx of bleed, PO intake, DDI Monitor closely for bleeding complications  Thank you for allowing [place] to participate in this patients care. Signe Colt, PharmD Pager: [phone]  03/09/2016 2:43 PM

## 2016-03-09 NOTE — Progress Notes (Signed)
Family in to see. Waiting to give report to [phone]

## 2016-03-09 NOTE — Progress Notes (Signed)
   03/09/16 1500  Clinical Encounter Type  Visited With Patient not available  Visit Type Spiritual support  Referral From Nurse  Consult/Referral To Chaplain  Chaplain visited but patient was undergoing procedure and chaplain was unable to wait. Follow-up will be completed tomorrow. Kingslee Dowse, Chaplain

## 2016-03-09 NOTE — Progress Notes (Signed)
Hospital Problem List     Principal Problem:   LV (left ventricular) mural thrombus (HCC) Active Problems:   Anterior subendocardial MI (HCC)   Right leg pain   Non-STEMI (non-ST elevated myocardial infarction) (HCC)   Elevated troponin   Abnormal EKG   Diabetes (HCC)   Acute MI (HCC)   History of embolectomy   Pain   Thromboembolism (HCC)   Vascular occlusion   Fever   NSTEMI (non-ST elevated myocardial infarction) (HCC)   Chest pain   Abdominal pain    Patient Profile:   Primary Cardiologist: New - Dr. Eden Emms  40 M w/ PMH pf Type 2 DM here with subacute anterior MI complicated by LV thrombus and right femoral artery embolism (now s/p embolectomy and R calf fasciotomy for compartment syndrome). This was complicated by post operative anemia. Wound vac now removed and patient refusing further intervention by plastic surgery.   Subjective   Denies any episodes of chest discomfort or dyspnea overnight or this AM. Wants his IV's removed (explained why he has 2 in place while admitted to the hospital and prior to his cardiac catheterization).   Inpatient Medications    . [START ON 03/10/2016] aspirin EC  81 mg Oral Daily  . atorvastatin  80 mg Oral q1800  . docusate sodium  100 mg Oral Daily  . insulin aspart  0-15 Units Subcutaneous TID WC  . insulin aspart  0-5 Units Subcutaneous QHS  . insulin aspart  6 Units Subcutaneous TID WC  . insulin glargine  20 Units Subcutaneous QHS  . pantoprazole  40 mg Oral BID AC  . propranolol  10 mg Oral TID  . sodium chloride flush  3 mL Intravenous Q12H    Vital Signs    Filed Vitals:   03/08/16 2324 03/09/16 0017 03/09/16 0457 03/09/16 0647  BP: 92/60 96/62  104/72  Pulse: 81 76  74  Temp:    97.9 F (36.6 C)  TempSrc:    Oral  Resp: 20 20  20   Height:      Weight:   209 lb 3.2 oz (94.892 kg)   SpO2: 96% 97%  100%    Intake/Output Summary (Last 24 hours) at 03/09/16 0841 Last data filed at 03/09/16 0027  Gross per  24 hour  Intake    633 ml  Output   3200 ml  Net  -2567 ml   Filed Weights   03/07/16 0330 03/08/16 0500 03/09/16 0457  Weight: 207 lb 14.3 oz (94.3 kg) 209 lb 7 oz (95 kg) 209 lb 3.2 oz (94.892 kg)    Physical Exam    General: Well developed, well nourished, male appearing in no acute distress. Head: Normocephalic, atraumatic.  Neck: Supple without bruits, JVD not elevated. Lungs:  Resp regular and unlabored, CTA without wheezing or rales. Heart: RRR, S1, S2, no S3, S4, or murmur; no rub. Abdomen: Soft, non-tender, non-distended with normoactive bowel sounds. No hepatomegaly. No rebound/guarding. No obvious abdominal masses. Extremities: No clubbing, cyanosis, or edema. Distal pedal pulses are 2+ bilaterally. RLE dressing present. Neuro: Alert and oriented X 3. Moves all extremities spontaneously. Psych: Normal affect.  Labs    CBC  Recent Labs  03/08/16 0317 03/09/16 0412  WBC 8.5 6.7  HGB 8.7* 8.9*  HCT 26.6* 27.4*  MCV 92.0 90.7  PLT 431* 447*   Basic Metabolic Panel  Recent Labs  03/07/16 0808 03/08/16 0317  NA 136 135  K 3.5 3.5  CL 105 107  CO2 25 22  GLUCOSE 96 104*  BUN 5* 5*  CREATININE 0.83 0.83  CALCIUM 7.6* 7.3*    Telemetry    NSR, HR in 70's - 80's.   ECG    NSR, HR 71, anterior TWI.    Cardiac Studies and Radiology    Dg Chest 2 View  03/06/2016  CLINICAL DATA:  Fever EXAM: CHEST  2 VIEW COMPARISON:  February 26, 2016 FINDINGS: There is slight atelectasis in the left lower lobe. The lungs elsewhere are clear. Heart is upper normal in size with pulmonary vascularity within normal limits. There is prominence of the mediastinum at the level of the aortic arch. No adenopathy. No bone lesions. IMPRESSION: Mild left base atelectasis. No edema or consolidation. Heart is upper normal in size. Note that there is prominence of the mediastinum at the level of the aortic arch. The significance of this finding is uncertain. This finding may warrant  contrast enhanced chest CT to evaluate the aorta in this region. These results will be called to the ordering clinician or representative by the Radiologist Assistant, and communication documented in the PACS or zVision Dashboard. Electronically Signed   By: Bretta Bang III M.D.   On: 03/06/2016 14:26   Dg Chest 2 View  02/21/2016  CLINICAL DATA:  Left lower extremity numbness and pain. EXAM: CHEST  2 VIEW COMPARISON:  None. FINDINGS: Normal heart size. Low lung volumes. Top-normal heart size. Mediastinal contour is within normal limits. No pneumothorax. No pleural effusion. Lungs appear clear, with no acute consolidative airspace disease and no pulmonary edema. IMPRESSION: Low lung volumes.  No active disease in the chest. Electronically Signed   By: Delbert Phenix M.D.   On: 02/21/2016 11:27   Ct Chest W Contrast  03/06/2016  CLINICAL DATA:  55 year old male with history of right-sided abdominal pain. Substernal chest pain 2 weeks ago lasting for 48 hours. EXAM: CT CHEST, ABDOMEN, AND PELVIS WITH CONTRAST TECHNIQUE: Multidetector CT imaging of the chest, abdomen and pelvis was performed following the standard protocol during bolus administration of intravenous contrast. CONTRAST:  100 mL of Isovue-300. COMPARISON:  No priors. FINDINGS: CT CHEST FINDINGS Mediastinum/Lymph Nodes: Heart size is enlarged. There is no significant pericardial fluid, thickening or pericardial calcification. 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 Image 29 of series 2 demonstrates a small 7 mm filling defect in the apex of the left ventricle, compatible with apical thrombus. There is no significant pericardial fluid, thickening or pericardial calcification. No pathologically enlarged mediastinal or hilar lymph  nodes. No aneurysm or dissection of the thoracic aorta. Esophagus is unremarkable in appearance. No axillary lymphadenopathy. Lungs/Pleura: Linear areas of scarring and/or subsegmental atelectasis are noted in the lower lobes of the lungs bilaterally. Trace bilateral pleural effusions. No confluent consolidative airspace disease. No definite suspicious appearing pulmonary nodules or masses are noted (assessment is limited by considerable patient respiratory motion). Musculoskeletal/Soft Tissues: There are no aggressive appearing lytic or blastic lesions noted in the visualized portions of the skeleton. CT ABDOMEN AND PELVIS FINDINGS Hepatobiliary: No cystic or solid hepatic lesions. No intra or extrahepatic biliary ductal dilatation. Small calcified gallstones are noted dependently in the gallbladder, measuring up to 7 mm in diameter. Gallbladder wall appears mildly thickened, and there are subtle pericholecystic inflammatory changes. Gallbladder is moderately distended. Pancreas: No pancreatic mass. No pancreatic ductal dilatation. No  pancreatic or peripancreatic fluid or inflammatory changes. Spleen: Unremarkable. Adrenals/Urinary Tract: Bilateral adrenal glands and bilateral kidneys are normal in appearance. No hydroureteronephrosis. Urinary bladder is normal in appearance. Stomach/Bowel: The appearance of the stomach is normal. There is no pathologic dilatation of small bowel or colon. Normal appendix. Vascular/Lymphatic: No significant atherosclerotic disease, aneurysm or dissection identified in the abdominal or pelvic vasculature. There is some intermediate to high attenuation fluid adjacent to the right common femoral vessels, presumably a postprocedural hematoma from recent catheterization procedure. No findings to suggest acute active extravasation at this time. No lymphadenopathy noted in the abdomen or pelvis. Reproductive: Prostate gland and seminal vesicles are unremarkable in appearance. Other: No  significant volume of ascites.  No pneumoperitoneum. Musculoskeletal: There are no aggressive appearing lytic or blastic lesions noted in the visualized portions of the skeleton. IMPRESSION: 1. Cholelithiasis with findings concerning for potential early acute cholecystitis. This should be further evaluated with right upper quadrant abdominal ultrasound if there is clinical concern for acute cholecystitis. 2. The patient continues to have a small volume of apical thrombus in the left ventricle. 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 3. Areas of scarring and/or subsegmental atelectasis in the lower lobes of the lungs bilaterally. 4. Trace bilateral pleural effusions lying dependently. 5. Resolving postprocedural hematoma adjacent to the right common femoral vessels, likely from prior catheterization procedure. No findings to suggest acute active extravasation at this time. 6. Additional incidental findings, as above. These results will be called to the ordering clinician or representative by the Radiologist Assistant, and communication documented in the PACS or zVision Dashboard. Electronically Signed   By: Trudie Reed M.D.   On: 03/06/2016 18:20   US Abdomen Complete  03/08/2016  CLINICAL DATA:  No gallstones, 3 days of abdominal pain, elevated white blood cell count. EXAM: ABDOMEN ULTRASOUND COMPLETE COMPARISON:  CT scan of the abdomen pelvis of March 06, 2016 FINDINGS: Gallbladder: The gallbladder is adequately distended. In the gallbladder neck there is a 9 mm echogenic shadowing focus consistent with an impacted stone. There is sludge present. There is no gallbladder wall thickening or pericholecystic fluid. Common bile duct: Diameter: 4.9 mm Liver: The hepatic echotexture is mildly increased diffusely. There is no discrete mass or intrahepatic ductal dilation. IVC: No abnormality  visualized. Pancreas: Bowel gas obscured the pancreas. Spleen: Size and appearance within normal limits. Right Kidney: Length: 13 cm. Echogenicity within normal limits. No mass or hydronephrosis visualized. Left Kidney: Length: 13.2 cm. Echogenicity within normal limits. No mass or hydronephrosis visualized. Abdominal aorta: Bowel gas limited evaluation of the abdominal aorta. The maximal diameter observed is 2.5 cm proximally Other findings: No ascites is observed. There is a left pleural effusion. IMPRESSION: 1. Mildly distended gallbladder with a 9 mm stone impacted in the gallbladder neck. No significant gallbladder wall thickening and no pericholecystic fluid is observed. There is no positive sonographic Murphy's sign. 2. Fatty infiltrative change of the liver. 3. No acute abnormality observed elsewhere within the abdomen. 4. Left pleural effusion. Electronically Signed   By: David  [place] M.D.   On: 03/08/2016 07:07   Ct Abdomen Pelvis W Contrast  03/06/2016  CLINICAL DATA:  55 year old male with history of right-sided abdominal pain. Substernal chest pain 2 weeks ago lasting for 48 hours. EXAM: CT CHEST, ABDOMEN, AND PELVIS WITH CONTRAST TECHNIQUE: Multidetector CT imaging of the chest, abdomen and pelvis was performed following the  standard protocol during bolus administration of intravenous contrast. CONTRAST:  100 mL of Isovue-300. COMPARISON:  No priors. FINDINGS: CT CHEST FINDINGS Mediastinum/Lymph Nodes: Heart size is enlarged. There is no significant pericardial fluid, thickening or pericardial calcification. 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 Image 29 of series 2 demonstrates a small 7 mm filling defect in the apex of the left ventricle, compatible with apical thrombus. There is  no significant pericardial fluid, thickening or pericardial calcification. No pathologically enlarged mediastinal or hilar lymph nodes. No aneurysm or dissection of the thoracic aorta. Esophagus is unremarkable in appearance. No axillary lymphadenopathy. Lungs/Pleura: Linear areas of scarring and/or subsegmental atelectasis are noted in the lower lobes of the lungs bilaterally. Trace bilateral pleural effusions. No confluent consolidative airspace disease. No definite suspicious appearing pulmonary nodules or masses are noted (assessment is limited by considerable patient respiratory motion). Musculoskeletal/Soft Tissues: There are no aggressive appearing lytic or blastic lesions noted in the visualized portions of the skeleton. CT ABDOMEN AND PELVIS FINDINGS Hepatobiliary: No cystic or solid hepatic lesions. No intra or extrahepatic biliary ductal dilatation. Small calcified gallstones are noted dependently in the gallbladder, measuring up to 7 mm in diameter. Gallbladder wall appears mildly thickened, and there are subtle pericholecystic inflammatory changes. Gallbladder is moderately distended. Pancreas: No pancreatic mass. No pancreatic ductal dilatation. No pancreatic or peripancreatic fluid or inflammatory changes. Spleen: Unremarkable. Adrenals/Urinary Tract: Bilateral adrenal glands and bilateral kidneys are normal in appearance. No hydroureteronephrosis. Urinary bladder is normal in appearance. Stomach/Bowel: The appearance of the stomach is normal. There is no pathologic dilatation of small bowel or colon. Normal appendix. Vascular/Lymphatic: No significant atherosclerotic disease, aneurysm or dissection identified in the abdominal or pelvic vasculature. There is some intermediate to high attenuation fluid adjacent to the right common femoral vessels, presumably a postprocedural hematoma from recent catheterization procedure. No findings to suggest acute active extravasation at this time. No  lymphadenopathy noted in the abdomen or pelvis. Reproductive: Prostate gland and seminal vesicles are unremarkable in appearance. Other: No significant volume of ascites.  No pneumoperitoneum. Musculoskeletal: There are no aggressive appearing lytic or blastic lesions noted in the visualized portions of the skeleton. IMPRESSION: 1. Cholelithiasis with findings concerning for potential early acute cholecystitis. This should be further evaluated with right upper quadrant abdominal ultrasound if there is clinical concern for acute cholecystitis. 2. The patient continues to have a small volume of apical thrombus in the left ventricle. 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 3. Areas of scarring and/or subsegmental atelectasis in the lower lobes of the lungs bilaterally. 4. Trace bilateral pleural effusions lying dependently. 5. Resolving postprocedural hematoma adjacent to the right common femoral vessels, likely from prior catheterization procedure. No findings to suggest acute active extravasation at this time. 6. Additional incidental findings, as above. These results will be called to the ordering clinician or representative by the Radiologist Assistant, and communication documented in the PACS or zVision Dashboard. Electronically Signed   By: Trudie Reed M.D.   On: 03/06/2016 18:20   Ct Angio Ao+bifem W &/or Wo Contrast  02/21/2016  CLINICAL DATA:  Right lower extremity pain with diminished pulses and numbness. EXAM: CT ANGIOGRAPHY OF ABDOMINAL AORTA WITH ILIOFEMORAL RUNOFF TECHNIQUE: Multidetector CT imaging of the abdomen, pelvis and lower  extremities was performed using the standard protocol during bolus administration of intravenous contrast. Multiplanar CT image reconstructions and MIPs were obtained to evaluate the vascular anatomy. CONTRAST:  100 mL Isovue 370 IV COMPARISON:  None.  FINDINGS: Aorta: Normally patent abdominal aorta without evidence of atherosclerosis, aneurysm or dissection. Visceral arteries are widely patent including the celiac axis, superior mesenteric artery, inferior mesenteric artery and bilateral single renal arteries. No embolic occlusions identified in the abdomen or pelvis. Right Lower Extremity: Widely patent right common, external and internal iliac arteries. The common femoral artery is normally patent up to its bifurcation. At the femoral bifurcation, there is nearly occlusive thrombus identified extending into both proximal profunda femoral and superficial femoral artery trunks. Contrast flow is seen beyond the thrombus and into normal distal profunda femoral branches. The SFA is open, but demonstrates some anterior mural thrombus. In the absence of significant atherosclerosis, this may represent some adherent thrombus from recent/prior embolic event. Anterior mural thrombus continues into the popliteal artery above the knee. Below the knee, there is only faint opacification of the distal popliteal artery and no visualized opacification of the tibial arteries. This is likely due to slow flow. However, component of thrombus in the tibial arteries cannot be entirely excluded. Left Lower Extremity: Left-sided arterial supply is completely normal including the common femoral artery, profunda femoral artery, superficial femoral artery, popliteal artery and tibial arteries. Or there is some narrowing of the popliteal artery just above the knee joint of approximately 40-50% is associated with a slightly tortuous/ course. No plaque is identified at this level. A component of incidental popliteal entrapment cannot be excluded. Review of the MIP images confirms the above findings. Lower chest:  Bibasilar atelectasis.  No pleural effusions. Hepatobiliary: Unremarkable arterial phase imaging of the liver and gallbladder. Pancreas: Unremarkable appearance. Spleen: Normal  appearance of spleen. Adrenals/Urinary Tract: Normal adrenal glands and kidneys. No renal infarcts identified. Stomach/Bowel: Normal appearance without evidence to suggest mesenteric ischemia or inflammation. No obstruction. Normal appendix. No free air, free fluid or abscess. Lymphatic: No enlarged lymph nodes identified. Reproductive: Negative. Other: No hernias. Musculoskeletal: Bony structures are normal. IMPRESSION: 1. Embolic thrombus at the bifurcation of the right common femoral artery with thrombus extending into proximal profunda femoral and superficial femoral arteries. Thrombus is nearly occlusive but contrast is seen to flow around the thrombus into the distal vessels. There is also a component of probable anterior mural thrombus in the SFA and popliteal arteries which may be consistent with additional adherent thrombus. Tibial arteries are not adequately opacified on the right, most likely secondary to slow flow. Distal thrombus cannot be excluded. Vascular Surgical consultation is recommended. 2. Incidental finding of a mildly tortuous course of the popliteal artery on the left associated with some narrowing just above the knee joint. This narrowing does not appear atherosclerotic and there may be an incidental component of popliteal entrapment syndrome on the left. These results were called by telephone at the time of interpretation on 02/21/2016 at 2:55 pm to Dr. Zadie Rhine , who verbally acknowledged these results. Electronically Signed   By: Irish Lack M.D.   On: 02/21/2016 14:56   Dg Chest Port 1 View  02/26/2016  CLINICAL DATA:  Fever, onset tonight. EXAM: PORTABLE CHEST 1 VIEW COMPARISON:  None. FINDINGS: A single AP portable view of the chest demonstrates no focal airspace consolidation or alveolar edema. The lungs are grossly clear. There is no large effusion or pneumothorax. Cardiac and mediastinal contours appear unremarkable. IMPRESSION: No  acute findings Electronically Signed    By: Ellery Plunk M.D.   On: 02/26/2016 02:15   Dg Abd Portable 1v  03/05/2016  CLINICAL DATA:  Acute onset of epigastric and right lower quadrant abdominal pain. Initial encounter. EXAM: PORTABLE ABDOMEN - 1 VIEW COMPARISON:  None. FINDINGS: Scattered air-filled loops of small bowel measure up to 3.4 cm in diameter, though nondistended loops of small bowel are also seen. This likely reflects mild small bowel dysmotility. Residual stool is noted within the colon. No free intra-abdominal air is identified, though evaluation for free air is limited on supine views. The visualized osseous structures are within normal limits; the sacroiliac joints are unremarkable in appearance. The visualized lung bases are essentially clear. IMPRESSION: Air-filled small bowel loops measure up to 3.4 cm in diameter, though nondistended loops of small bowel are also seen. This likely reflects mild small bowel dysmotility. No evidence for bowel obstruction. No free intra-abdominal air seen. Electronically Signed   By: Roanna Raider M.D.   On: 03/05/2016 23:55    Echocardiogram: 02/21/2016 Study Conclusions  - Left ventricle: Anteroapical infarct with large thrombus burden  at apex. Mobile and high embolic potential. The cavity size was  severely dilated. Wall thickness was normal. Systolic function  was moderately to severely reduced. The estimated ejection  fraction was in the range of 30% to 35%. - Atrial septum: No defect or patent foramen ovale was identified.  Assessment & Plan    1. Subacute anterior MI - echo this admission shows and anteroapical infarct with EF of 30-35%, consistent with an anterior MI. H/H is now stable and no additional surgeries are planned at this time.  - Insight remains poor, as he continues to think this was caused by dehydration and being in hot weather.Father had CABG in his 24s. - Risks and benefits of cardiac catheterization have been discussed with the patient. The  patient understands that risks included but are not limited to stroke (1 in 1000), death (1 in 1000), kidney failure [usually temporary] (1 in 500), bleeding (1 in 200), allergic reaction [possibly serious] (1 in 200). He is refusing to sign consent for the procedure, despite having the procedure explained in detail multiple times. He wishes to meet the provider performing the procedure (Dr. Kirke Corin) prior to signing consent. I made the cath lab and Dr. Kirke Corin aware of this. Currently on the board for this morning.  - continue Heparin, ASA, statin, and BB. Will need to transition to Coumadin following his cath.  2. LV thrombus - Continue heparin. Will transition to warfarin after cath.  3. R LE embolism s/p embolectomy and fasciotomy - per Vascular Surgery.   4. Post-operative anemia - Hgb stable at 8.9 this AM. Last transfusion 03/03/16.  Leonides Schanz Ellsworth Lennox , PA-C 8:41 AM 03/09/2016 Pager: [phone]

## 2016-03-09 NOTE — Progress Notes (Addendum)
  [facility] Progress Note  Subjective    Had an ok night. Refusing dressing change this am, as he had his dressing changed last night.   Objective Filed Vitals:   03/09/16 0017 03/09/16 0647  BP: 96/62 104/72  Pulse: 76 74  Temp:  97.9 F (36.6 C)  Resp: 20 20    Intake/Output Summary (Last 24 hours) at 03/09/16 0801 Last data filed at 03/09/16 0027  Gross per 24 hour  Intake    633 ml  Output   3200 ml  Net  -2567 ml   Right leg dressing with minimal drainage on lateral aspect of kerlix 2-3+ right DP pulse.   Assessment/Planning: 55 y.o. male  12 Days Post-Op   Right foot with 2-3+ DP pulse. Will return later this afternoon for dressing change.  For heart cath today.  Raymond Gurney 03/09/2016 8:01 AM --  Addendum Dressing changed this afternoon. Muscle is viable. Some fibrinous tissue at perimeter.  Continue daily dressing changes. Foot is well perfused.   Maris Berger, PA-C  Laboratory CBC    Component Value Date/Time   WBC 6.7 03/09/2016 0412   HGB 8.9* 03/09/2016 0412   HCT 27.4* 03/09/2016 0412   PLT 447* 03/09/2016 0412    BMET    Component Value Date/Time   NA 135 03/08/2016 0317   K 3.5 03/08/2016 0317   CL 107 03/08/2016 0317   CO2 22 03/08/2016 0317   GLUCOSE 104* 03/08/2016 0317   BUN 5* 03/08/2016 0317   CREATININE 0.83 03/08/2016 0317   CALCIUM 7.3* 03/08/2016 0317   GFRNONAA >60 03/08/2016 0317   GFRAA >60 03/08/2016 0317    COAG Lab Results  Component Value Date   INR 1.11 02/21/2016   No results found for: PTT  Antibiotics Anti-infectives    Start     Dose/Rate Route Frequency Ordered Stop   02/26/16 0930  [MAR Hold]  cefUROXime (ZINACEF) 1.5 g in dextrose 5 % 50 mL IVPB     (MAR Hold since 02/26/16 0928)   1.5 g 100 mL/hr over 30 Minutes Intravenous To ShortStay Surgical 02/25/16 1055 02/26/16 1120   02/22/16 0800  cefUROXime (ZINACEF) 1.5 g in dextrose 5 % 50 mL IVPB     1.5 g 100 mL/hr over  30 Minutes Intravenous Every 12 hours 02/21/16 2115 02/22/16 2140   02/22/16 0600  cefUROXime (ZINACEF) injection 1.5 g     1.5 g Intramuscular On call to O.R. 02/21/16 1523 02/21/16 1713       Maris Berger, PA-C [facility] Office: [phone] Pager: [phone] 03/09/2016 8:01 AM

## 2016-03-09 NOTE — H&P (View-Only) (Signed)
PATIENT ID: Troy Yu is a 56M with diabetes here with subacute anterior MI complicated by LV thrombus and right femoral artery embolism now s/p embolectomy and R calf fasciotomy for compartment syndrome.  This was complicated by post operative anemia.  Wound vac now removed and patient refusing further intervention by plastic surgery.    SUBJECTIVE:  Feeling well.  Denies chest pain or shortness of breath.     PHYSICAL EXAM Filed Vitals:   03/08/16 1019 03/08/16 1200 03/08/16 1253 03/08/16 1300  BP: 113/63  87/73 101/63  Pulse: 80 81 78   Temp:   97.9 F (36.6 C)   TempSrc:   Oral   Resp:  26 26   Height:      Weight:      SpO2:  99% 100%    General:  Well-appearing.  No acute distress. Neck: No JVD Lungs:  CTAB.  No crackles, rhonchi or wheezes Heart:  RRR.  No m/r/g.  Normal S1/S2.   Abdomen:  Soft, NT, ND.  +BS Extremities:  R LE dressing in place.  No LLE edema   LABS: Lab Results  Component Value Date   TROPONINI 0.85* 02/22/2016   Results for orders placed or performed during the hospital encounter of 02/21/16 (from the past 24 hour(s))  Glucose, capillary     Status: Abnormal   Collection Time: 03/07/16  4:41 PM  Result Value Ref Range   Glucose-Capillary 138 (H) 65 - 99 mg/dL   Comment 1 Capillary Specimen   Glucose, capillary     Status: None   Collection Time: 03/07/16 10:13 PM  Result Value Ref Range   Glucose-Capillary 91 65 - 99 mg/dL   Comment 1 Capillary Specimen   Basic metabolic panel     Status: Abnormal   Collection Time: 03/08/16  3:17 AM  Result Value Ref Range   Sodium 135 135 - 145 mmol/L   Potassium 3.5 3.5 - 5.1 mmol/L   Chloride 107 101 - 111 mmol/L   CO2 22 22 - 32 mmol/L   Glucose, Bld 104 (H) 65 - 99 mg/dL   BUN 5 (L) 6 - 20 mg/dL   Creatinine, Ser 6.01 0.61 - 1.24 mg/dL   Calcium 7.3 (L) 8.9 - 10.3 mg/dL   GFR calc non Af Amer >60 >60 mL/min   GFR calc Af Amer >60 >60 mL/min   Anion gap 6 5 - 15  CBC     Status: Abnormal     Collection Time: 03/08/16  3:17 AM  Result Value Ref Range   WBC 8.5 4.0 - 10.5 K/uL   RBC 2.89 (L) 4.22 - 5.81 MIL/uL   Hemoglobin 8.7 (L) 13.0 - 17.0 g/dL   HCT 09.3 (L) 23.5 - 57.3 %   MCV 92.0 78.0 - 100.0 fL   MCH 30.1 26.0 - 34.0 pg   MCHC 32.7 30.0 - 36.0 g/dL   RDW 22.0 25.4 - 27.0 %   Platelets 431 (H) 150 - 400 K/uL  Heparin level (unfractionated)     Status: None   Collection Time: 03/08/16  3:17 AM  Result Value Ref Range   Heparin Unfractionated 0.34 0.30 - 0.70 IU/mL  Glucose, capillary     Status: Abnormal   Collection Time: 03/08/16 10:01 AM  Result Value Ref Range   Glucose-Capillary 104 (H) 65 - 99 mg/dL   Comment 1 Capillary Specimen   Glucose, capillary     Status: Abnormal   Collection Time: 03/08/16 12:56 PM  Result Value Ref Range   Glucose-Capillary 137 (H) 65 - 99 mg/dL    Intake/Output Summary (Last 24 hours) at 03/08/16 1536 Last data filed at 03/08/16 0800  Gross per 24 hour  Intake  524.5 ml  Output   1825 ml  Net -1300.5 ml    Telemetry:  No events  Echo 02/21/16: Study Conclusions  - Left ventricle: Anteroapical infarct with large thrombus burden  at apex. Mobile and high embolic potential. The cavity size was  severely dilated. Wall thickness was normal. Systolic function  was moderately to severely reduced. The estimated ejection  fraction was in the range of 30% to 35%. - Atrial septum: No defect or patent foramen ovale was identified.   ASSESSMENT AND PLAN:  Principal Problem:   LV (left ventricular) mural thrombus (HCC) Active Problems:   Anterior subendocardial MI (HCC)   Right leg pain   Non-STEMI (non-ST elevated myocardial infarction) (HCC)   Elevated troponin   Abnormal EKG   Diabetes (HCC)   Acute MI (HCC)   History of embolectomy   Pain   Thromboembolism (HCC)   Vascular occlusion   Fever   NSTEMI (non-ST elevated myocardial infarction) (HCC)   Chest pain   Abdominal pain    # Subacute anterior MI:  Troy Yu's echo is consistent with an anterior MI.  H/H is now stable and no additional surgeries are planned at this time.  He has been placed on the cath board for LHC tomorrow.  Continue aspirin and propranolol.  Unclear why this beta blocker was chosen.  Atorvastatin was started this admission.  Insight remains poor.  He continues to think this was caused by dehydration and being in hot weather.  Father had a CABG in his 60s.  Discussed lifestyle modification.   Risks and benefits of cardiac catheterization have been discussed with the patient.  The patient understands that risks included but are not limited to stroke (1 in 1000), death (1 in 1000), kidney failure [usually temporary] (1 in 500), bleeding (1 in 200), allergic reaction [possibly serious] (1 in 200). The patient understands and agrees to proceed.   # LV thrombus: Continue heparin.  Will transition to warfarin after cath.  # R LE embolism s/p embolectomy and fasciotomy: Per Vascular Surgery.    # Post-operative anemia: Stable.  Last transfusion 03/03/16.  Time spent: 35 minutes-Greater than 50% of this time was spent in counseling, explanation of diagnosis, planning of further management, and coordination of care.  Natlie Asfour C. Delta, MD, FACC 03/08/2016 3:36 PM  

## 2016-03-09 NOTE — Progress Notes (Signed)
Pt refusing to sign consent until he meets the MD that will be preforming the heart cath. Pt now agreeable to have PT/INR done, Lab notified.

## 2016-03-09 NOTE — Progress Notes (Signed)
PT Cancellation Note  Patient Details Name: Troy Yu MRN: [id number] DOB: August 05, 1961   Cancelled Treatment:    Reason Eval/Treat Not Completed: Patient at procedure or test/unavailable (Pt in cath lab). Will try again tomorrow.   [name] 03/09/2016, 11:09 AM [name] PT [phone]

## 2016-03-09 NOTE — Progress Notes (Signed)
PROGRESS NOTE    Troy Yu  KGU:[id number] DOB: 13-Feb-1961 DOA: 02/21/2016 PCP: No primary care provider on file.   Brief Narrative:  Troy Yu is an 55 y.o. male who was in his usual state of health until about 2 weeks ago when he had substernal chest pain that lasted about 48 hours.The pain went away after he vomited, and he attributed his symptoms to a prolonged religious fast. He then come to the [facility] ED on 02/21/16 with severe RLE pain associated with paresthesias. Upon presentation, he was found to have an abnormal EKG and elevated troponin. Cardiology was subsequently called to evaluate the patient, and he was felt to have suffered a subacute anterior MI. STAT echo was ordered and EDP advised to order an arterial duplex with recommendation to consult vascular surgery. IV heparin ordered and the patient was transferred to [facility] for further evaluation by vascular surgery, as it was felt that he had a right femoral embolism with concern for threatened limb. Dr Imogene Burn subsequently performed a right femoral embolectomy and right calf compartment fasciotomies x 4 . At 4:16 a.m. On 02/22/16, the patient complained of loss of sensation to RLE, and the patient reports he was in severe pain. He was given pain medication which did not alleviate his symptoms. He was re-evaluated by Dr. Imogene Burn at 7:33 a.m. Who removed the ACE wrap from his RLE which relieved the pain, and the patient was felt to have excessive bleeding in his calf related to full anti-coagulation. He was taken back to surgery for evacuation of hematoma and VAC dressings. Cardiologist continues to follow for management of his subacute MI. Patient underwent closure of lateral fasciotomy. Plastic surgery consulted and recommendations given. 7/6 patient's hemoglobin dropped to 5 from continuous oozing from the leg wound, and his heparin was stopped on 7/5. Dr Early recommended surgical evaluation by plastic surgery, but patient does not want  any further surgery at this time and wanted to address the bleeding from the leg wound with local wound care. Explained to him that he needs IV heparin for his LV thrombus and he is at risk from cardiac complications without IV heparin. And at the same time he continues to bleed on IV heparin, he needs the Leg wound to be evaluated by the plastic surgery for possible skin graft surgery.  7/7 pt 's hemoglobin improved to 10 , and restarted IV heparin on 7/8, as oozing from the leg has improved and hemoglobin stable at 9.7 7/9 abdominal distention, and abd film shows gas filled small bowel loops, recommended to get OOB and ambulate.  Fevers on and off over the 24 to 36 hrs, blood culture, CXR, UA, and CT abdomen ordered .  7/10 work up revealed CT abd showing cholelithiasis with fingings concerning for early acute cholecystitis but currently he is asymptomatic. US abdomen ordered.   7/11 US abdomen shows a gall stone in the gall bladder neck but he remains asymptomatic at this time . Discussed with Dr Duke Salvia And schedule for cardiac catheterization tomorrow if patient agrees.  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  -Case taken over by Dr. Arbie Cookey after patient requested a second opinion.  -s/p closure of lateral fasciotomy site and placement of VAC  on medial fasciotomy site -Plastic surgery consulted by vascular for possible STSG to medial fasciotomy site, but patient refusing any further surgical procedures.  -Wound vac removed (medial) and Daily hydrogel application and wet-to-dry dressings - 7/4 leg wound continues to ooze blood and dressing changed several times and Dr  Early contacted and IV heparin was discontinued.  - 7/6 his hemoglobin dropped to 5 and he received 3 units of prbc transfusion. Repeat hemoglobin improved .  - 7/8 heparin restarted as his hemoglobin is stable and oozing from the wound has decreased.  -7/10 no changes in management.   Symptomatic anemia/ Postoperative anemia/ Anemia secondary to blood loss -During hospitalization, hemoglobin has dropped to as low as 4.7 , continues to drop , requiring many prbc transfusions.  -Has received a total of 10 u PRBCs this admission -Hemoglobin 5 on 7/6 and he received 3 units of prbc transfusion  -anemia from blood loss from continuous oozing from the leg wound. IV heparin was held on 7/5.  -restarted IV heparin on 7/8, as oozing from leg wound improved. -transfuse to keep hemoglobin greater than 8.  -hemoglobin today 8.9  Syncope and orthostasis -Likely secondary to blood loss -Continue to monitor closely  Anterior subendocardial MI (HCC)/Abnormal EKG/Elevated troponin -On therapeutic dose heparin, which was held on 7/5 for continuous oozing from the leg wound. Restarted IV heparin on 7/8 as his oozing from the leg wound decreased and his hemoglobin is stable.  -Cardiology consulted and appreciated -ACE-I to be started when BP able to tolerate -Cardiac cath today  Newly diagnosed diabetes mellitus/Hyperglycemia -Hemoglobin A1c 9.2.  -Continue lantus, ISS and CBG monitoring.  -Diabetes coordinator consulted  Hyponatremia -Resolved -Serum osm 273 , urine osmo, TSH and am cortisol normal limits. .  -Continue to monitor BMP intermittently.  Abdominal distention, and constipation: -patientreported abdominal pain and feeling bloated. AXR shows air filled small bowels, probably from small bowel dysmotility. Recommended he get out of bed and ambulate which will help relieve the gas, and help with constipation. -Improved with enema  Fevers  -on and OFF associated with chills,  none today.  -Initially thought to be from blood transfusions -Septic work up including: Blood cultures ordered, CXR, UA, and CT abd and pelvis without contrast ordered for further evaluation. CT abd shows cholelithiasis with fingings concerning for early acute cholecystitis.  -US abdomen ordered, which showed mildly dilated gall bladder, with a 9 mm gall bladder stone impacted in the necko f the gall bladder, no pericholecystic fluid no wall thickening. Negative murphy's sign. He is currently asymptomatic. Monitor. No further intervention needed at this time.   DVT Prophylaxis  Heparin   Code Status: Full  Family Communication:  Son at bedside  Disposition Plan: Admitted.   Consultants Cardiology  Vascular Surgery Plastic surgery  Procedures  Right femoral embolectomy Right calf four compartment fasciotomies  Evacuation of hematoma from bilateral right calf fasciotomies Placement of negative pressure dressings x2 Closure of lateral fasciotomy site and placement of VAC on medial fasciotomy site  Antibiotics   Anti-infectives    Start     Dose/Rate Route Frequency Ordered Stop   02/26/16 0930  [MAR Hold]  cefUROXime (ZINACEF) 1.5 g in dextrose 5 % 50 mL IVPB     (MAR Hold since 02/26/16 0928)   1.5 g 100 mL/hr over 30 Minutes Intravenous To ShortStay Surgical 02/25/16 1055 02/26/16 1120   02/22/16 0800  cefUROXime (ZINACEF) 1.5 g in dextrose 5 % 50 mL IVPB  1.5 g 100 mL/hr over 30 Minutes Intravenous Every 12 hours 02/21/16 2115 02/22/16 2140   02/22/16 0600  cefUROXime (ZINACEF) injection 1.5 g     1.5 g Intramuscular On call to O.R. 02/21/16 1523 02/21/16 1713      Subjective:   Troy Yu seen and examined today.  No new complaints today.  Will not have cath until the physician speaks with him. Does not want a plastic surgery consult yet. Denies chest pain, shortness of breath, abdominal pain, nausea, vomiting, constipation, diarrhea, headache, dizziness.    Objective:   Filed Vitals:   03/09/16 1146 03/09/16 1147 03/09/16 1152 03/09/16 1157  BP: 118/67 112/70 109/73 116/73  Pulse: 96 91 93 84  Temp:      TempSrc:      Resp: Height:      Weight:      SpO2: 90% 98% 100% 95%    Intake/Output Summary (Last 24 hours) at 03/09/16 1236 Last data filed at 03/09/16 1024  Gross per 24 hour  Intake    318 ml  Output   5000 ml  Net  -4682 ml   Filed Weights   03/07/16 0330 03/08/16 0500 03/09/16 0457  Weight: 94.3 kg (207 lb 14.3 oz) 95 kg (209 lb 7 oz) 94.892 kg (209 lb 3.2 oz)    Exam  General: Well developed, well nourished, NAD, appears stated age  HEENT: NCAT, mucous membranes moist.   Cardiovascular: S1 S2 auscultated, no rubs, murmurs or gallops. Regular rate and rhythm.  Respiratory: Clear to auscultation bilaterally with equal chest rise  Abdomen: Soft, nontender, nondistended, + bowel sounds  Extremities: warm dry without cyanosis clubbing or edema. RLE wrapped  Neuro: AAOx3, nonfocal  Psych: Normal affect and demeanor with intact judgement and insight   Data Reviewed: I have personally reviewed following labs and imaging studies  CBC:  Recent Labs Lab 03/05/16 2345 03/06/16 0410 03/07/16 0808 03/08/16 0317 03/09/16 0412  WBC 12.3* 15.2* 11.4* 8.5 6.7  HGB 9.4* 8.9* 8.4* 8.7* 8.9*  HCT 29.6* 27.8* 25.7* 26.6* 27.4*  MCV 91.9 92.4 90.8 92.0 90.7  PLT 345 345 425* 431* 447*   Basic Metabolic Panel:  Recent Labs Lab 03/05/16 0458 03/05/16 2345 03/06/16 0410 03/07/16 0808 03/08/16 0317  NA 132* 132* 130* 136 135  K 3.8 4.9 3.7 3.5 3.5  CL 103 103 101 105 107  CO2 GLUCOSE 148* 198* 197* 96 104*  BUN 5* 5*  CREATININE 0.85 0.88 0.85 0.83 0.83  CALCIUM 7.7* 7.3* 7.3* 7.6* 7.3*   GFR: Estimated Creatinine Clearance: 111 mL/min (by C-G formula based on Cr of 0.83). Liver Function Tests:  Recent Labs Lab 03/05/16 2345  AST 66*  ALT 16*  ALKPHOS 61    BILITOT 1.9*  PROT 5.2*  ALBUMIN 1.6*    Recent Labs Lab 03/05/16 2345  LIPASE 58*   No results for input(s): AMMONIA in the last 168 hours. Coagulation Profile:  Recent Labs Lab 03/09/16 0857  INR 1.44   Cardiac Enzymes: No results for input(s): CKTOTAL, CKMB, CKMBINDEX, TROPONINI in the last 168 hours. BNP (last 3 results) No results for input(s): PROBNP in the last 8760 hours. HbA1C: No results for input(s): HGBA1C in the last 72 hours. CBG:  Recent Labs Lab 03/08/16 1256 03/08/16 1619 03/08/16 2012 03/08/16 2332 03/09/16 0611  GLUCAP 137* 100* 81 157* 93   Lipid Profile: No results for input(s): CHOL,  HDL, LDLCALC, TRIG, CHOLHDL, LDLDIRECT in the last 72 hours. Thyroid Function Tests: No results for input(s): TSH, T4TOTAL, FREET4, T3FREE, THYROIDAB in the last 72 hours. Anemia Panel: No results for input(s): VITAMINB12, FOLATE, FERRITIN, TIBC, IRON, RETICCTPCT in the last 72 hours. Urine analysis:    Component Value Date/Time   COLORURINE YELLOW 03/06/2016 1500   APPEARANCEUR CLEAR 03/06/2016 1500   LABSPEC 1.008 03/06/2016 1500   PHURINE 6.5 03/06/2016 1500   GLUCOSEU NEGATIVE 03/06/2016 1500   HGBUR NEGATIVE 03/06/2016 1500   BILIRUBINUR NEGATIVE 03/06/2016 1500   KETONESUR NEGATIVE 03/06/2016 1500   PROTEINUR NEGATIVE 03/06/2016 1500   NITRITE NEGATIVE 03/06/2016 1500   LEUKOCYTESUR NEGATIVE 03/06/2016 1500   Sepsis Labs: @LABRCNTIP (procalcitonin:4,lacticidven:4)  ) Recent Results (from the past 240 hour(s))  Culture, blood (Routine X 2) w Reflex to ID Panel     Status: None (Preliminary result)   Collection Time: 03/06/16 10:40 AM  Result Value Ref Range Status   Specimen Description BLOOD BLOOD LEFT ARM  Final   Special Requests IN PEDIATRIC BOTTLE 1CC  Final   Culture NO GROWTH 2 DAYS  Final   Report Status PENDING  Incomplete  Culture, blood (Routine X 2) w Reflex to ID Panel     Status: None (Preliminary result)   Collection Time:  03/06/16 10:45 AM  Result Value Ref Range Status   Specimen Description BLOOD BLOOD LEFT ARM  Final   Special Requests IN PEDIATRIC BOTTLE 1CC  Final   Culture NO GROWTH 2 DAYS  Final   Report Status PENDING  Incomplete      Radiology Studies: US Abdomen Complete  03/08/2016  CLINICAL DATA:  No gallstones, 3 days of abdominal pain, elevated white blood cell count. EXAM: ABDOMEN ULTRASOUND COMPLETE COMPARISON:  CT scan of the abdomen pelvis of March 06, 2016 FINDINGS: Gallbladder: The gallbladder is adequately distended. In the gallbladder neck there is a 9 mm echogenic shadowing focus consistent with an impacted stone. There is sludge present. There is no gallbladder wall thickening or pericholecystic fluid. Common bile duct: Diameter: 4.9 mm Liver: The hepatic echotexture is mildly increased diffusely. There is no discrete mass or intrahepatic ductal dilation. IVC: No abnormality visualized. Pancreas: Bowel gas obscured the pancreas. Spleen: Size and appearance within normal limits. Right Kidney: Length: 13 cm. Echogenicity within normal limits. No mass or hydronephrosis visualized. Left Kidney: Length: 13.2 cm. Echogenicity within normal limits. No mass or hydronephrosis visualized. Abdominal aorta: Bowel gas limited evaluation of the abdominal aorta. The maximal diameter observed is 2.5 cm proximally Other findings: No ascites is observed. There is a left pleural effusion. IMPRESSION: 1. Mildly distended gallbladder with a 9 mm stone impacted in the gallbladder neck. No significant gallbladder wall thickening and no pericholecystic fluid is observed. There is no positive sonographic Murphy's sign. 2. Fatty infiltrative change of the liver. 3. No acute abnormality observed elsewhere within the abdomen. 4. Left pleural effusion. Electronically Signed   By: David  [place] M.D.   On: 03/08/2016 07:07     Scheduled Meds: . [MAR Hold] aspirin EC  81 mg Oral Daily  . [MAR Hold] atorvastatin  80 mg Oral  q1800  . [MAR Hold] docusate sodium  100 mg Oral Daily  . [MAR Hold] insulin aspart  0-15 Units Subcutaneous TID WC  . [MAR Hold] insulin aspart  0-5 Units Subcutaneous QHS  . [MAR Hold] insulin aspart  6 Units Subcutaneous TID WC  . [MAR Hold] insulin glargine  20 Units Subcutaneous QHS  . [  MAR Hold] pantoprazole  40 mg Oral BID AC  . [MAR Hold] propranolol  10 mg Oral TID  . sodium chloride flush  3 mL Intravenous Q12H   Continuous Infusions: . [START ON 03/10/2016] sodium chloride    . heparin Stopped (03/09/16 1024)  . [MAR Hold] nitroGLYCERIN Stopped (03/08/16 1200)     LOS: 17 days   Time Spent in minutes   30 minutes  Phares Zaccone D.O. on 03/09/2016 at 12:36 PM  Between 7am to 7pm - Pager - [phone]  After 7pm go to [email or web address] - password [email or web address]  And look for the night coverage person covering for me after hours  [facility] Office  [phone]

## 2016-03-09 NOTE — Progress Notes (Signed)
RN attempted to get pt signature this morning for Cardiac Cath today. Pt refused at this time and stated " I am not awake enough to sign." RN will pass along to day shift nurse.   Roselie Awkward, RN

## 2016-03-09 NOTE — Interval H&P Note (Signed)
Cath Lab Visit (complete for each Cath Lab visit)  Clinical Evaluation Leading to the Procedure:   ACS: Yes.    Non-ACS:    Anginal Classification: CCS IV  Anti-ischemic medical therapy: Minimal Therapy (1 class of medications)  Non-Invasive Test Results: No non-invasive testing performed  Prior CABG: No previous CABG      History and Physical Interval Note:  03/09/2016 10:41 AM  Troy Yu  has presented today for surgery, with the diagnosis of n stemi  The various methods of treatment have been discussed with the patient and family. After consideration of risks, benefits and other options for treatment, the patient has consented to  Procedure(s): Right/Left Heart Cath and Coronary Angiography (N/A) as a surgical intervention .  The patient's history has been reviewed, patient examined, no change in status, stable for surgery.  I have reviewed the patient's chart and labs.  Questions were answered to the patient's satisfaction.     Lorine Bears

## 2016-03-09 NOTE — Progress Notes (Signed)
OT Cancellation Note  Patient Details Name: Troy Yu MRN: [id number] DOB: 14-Oct-1960   Cancelled Treatment:    Reason Eval/Treat Not Completed: Patient at procedure or test/ unavailable (going to cath lab)  [facility] Hailynn Slovacek, OTR/L  [phone] 03/09/2016 03/09/2016, 10:19 AM

## 2016-03-09 NOTE — Progress Notes (Signed)
ANTICOAGULATION CONSULT NOTE  Pharmacy Consult for Heparin  Indication:  LV thrombus and R femoral embolism s/p embolectomy  No Known Allergies  Patient Measurements: Height: 5\' 8"  (172.7 cm) Weight: 209 lb 3.2 oz (94.892 kg) IBW/kg (Calculated) : 68.4  Heparin dosing wt: 87 kg  Labs:  Recent Labs  03/07/16 0808 03/08/16 0317 03/09/16 0412 03/09/16 0857  HGB 8.4* 8.7* 8.9*  --   HCT 25.7* 26.6* 27.4*  --   PLT 425* 431* 447*  --   LABPROT  --   --   --  17.6*  INR  --   --   --  1.44  HEPARINUNFRC 0.44 0.34 0.47  --   CREATININE 0.83 0.83  --   --    Estimated Creatinine Clearance: 111 mL/min (by C-G formula based on Cr of 0.83).  Assessment: 55 yo male with LV thrombus s/p R femoral embolectomy and subsequent hematoma evacuation, for heparin.  His heparin was turned off due to complications of bleeding but resumed on 7/8.    HL 7/12 remains therapeutic at 0.47 on heparin 1300 units/hr. Hgb stable. No issues with infusion or bleeding noted.  Goal of Therapy:  Heparin level 0.3-0.5 units/ml  Monitor platelets by anticoagulation protocol: Yes   Plan:  -Continue Heparin 1300 units/hr -Daily HL/CBC -Monitor closely for bleeding complications -Follow-up plan for cath/PO anticoag  Fredonia Highland, PharmD PGY-1 Pharmacy Resident Pager: [phone] 03/09/2016 10:20 AM

## 2016-03-09 NOTE — Care Management Note (Addendum)
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  Pt Tx to 6c for chest pain post cath. Successful angioplasty and drug-eluting stent placement to the distal right coronary artery and mid left anterior descending artery. Plan to treat with Plavix.                    Action/Plan: CM will continue to monitor for disposition needs. CSW is following for possible SNF placement. Pt will need PT for recommendations. Pt is without insurance.   Expected Discharge Date:                  Expected Discharge Plan:  Home w Home Health Services  In-House Referral:  NA  Discharge planning Services  CM Consult, [facility]  Post Acute Care Choice:  NA Choice offered to:  NA  DME Arranged:  N/A DME Agency:  NA  HH Arranged:  NA HH Agency:  NA  Status of Service:  Completed, signed off  If discussed at Long Length of Stay Meetings, dates discussed:    Additional Comments:  Gala Lewandowsky, RN 03/09/2016, 2:37 PM

## 2016-03-09 NOTE — Progress Notes (Signed)
Lab went to draw pts PT/INR. Pt refused.

## 2016-03-09 NOTE — Clinical Social Work Note (Signed)
Patient transferred to 2W from 2H21, handoff given to unit CSW this CSW to sign off.  Ervin Knack. Petrea Fredenburg, MSW, Theresia Majors [phone] 03/09/2016 8:53 AM

## 2016-03-10 DIAGNOSIS — R9389 Abnormal findings on diagnostic imaging of other specified body structures: Secondary | ICD-10-CM | POA: Insufficient documentation

## 2016-03-10 DIAGNOSIS — R938 Abnormal findings on diagnostic imaging of other specified body structures: Secondary | ICD-10-CM

## 2016-03-10 DIAGNOSIS — I5041 Acute combined systolic (congestive) and diastolic (congestive) heart failure: Secondary | ICD-10-CM

## 2016-03-10 DIAGNOSIS — Z9889 Other specified postprocedural states: Secondary | ICD-10-CM

## 2016-03-10 LAB — HEPARIN LEVEL (UNFRACTIONATED)
HEPARIN UNFRACTIONATED: 0.57 [IU]/mL (ref 0.30–0.70)
Heparin Unfractionated: 0.19 IU/mL — ABNORMAL LOW (ref 0.30–0.70)
Heparin Unfractionated: 0.52 IU/mL (ref 0.30–0.70)

## 2016-03-10 LAB — CBC
HCT: 28.8 % — ABNORMAL LOW (ref 39.0–52.0)
Hemoglobin: 9.3 g/dL — ABNORMAL LOW (ref 13.0–17.0)
MCH: 29.2 pg (ref 26.0–34.0)
MCHC: 32.3 g/dL (ref 30.0–36.0)
MCV: 90.6 fL (ref 78.0–100.0)
Platelets: 478 10*3/uL — ABNORMAL HIGH (ref 150–400)
RBC: 3.18 MIL/uL — AB (ref 4.22–5.81)
RDW: 15.3 % (ref 11.5–15.5)
WBC: 6.8 10*3/uL (ref 4.0–10.5)

## 2016-03-10 LAB — BASIC METABOLIC PANEL
ANION GAP: 6 (ref 5–15)
BUN: <5 mg/dL — ABNORMAL LOW (ref 6–20)
CALCIUM: 7.6 mg/dL — AB (ref 8.9–10.3)
CHLORIDE: 103 mmol/L (ref 101–111)
CO2: 24 mmol/L (ref 22–32)
Creatinine, Ser: 0.85 mg/dL (ref 0.61–1.24)
GFR calc non Af Amer: >60 mL/min (ref >60)
GLUCOSE: 88 mg/dL (ref 65–99)
POTASSIUM: 3.5 mmol/L (ref 3.5–5.1)
Sodium: 133 mmol/L — ABNORMAL LOW (ref 135–145)

## 2016-03-10 LAB — GLUCOSE, CAPILLARY
GLUCOSE-CAPILLARY: 79 mg/dL (ref 65–99)
GLUCOSE-CAPILLARY: 90 mg/dL (ref 65–99)
Glucose-Capillary: 126 mg/dL — ABNORMAL HIGH (ref 65–99)
Glucose-Capillary: 127 mg/dL — ABNORMAL HIGH (ref 65–99)

## 2016-03-10 LAB — PROTIME-INR
INR: 1.39 (ref 0.00–1.49)
PROTHROMBIN TIME: 17.2 s — AB (ref 11.6–15.2)

## 2016-03-10 MED ORDER — HEPARIN (PORCINE) IN NACL 100-0.45 UNIT/ML-% IJ SOLN
1000.0000 [IU]/h | INTRAMUSCULAR | Status: DC
  Administered 2016-03-11: 1200 [IU]/h via INTRAVENOUS
  Administered 2016-03-13: 1000 [IU]/h via INTRAVENOUS
  Filled 2016-03-10 (×3): qty 250

## 2016-03-10 MED ORDER — COUMADIN BOOK
Freq: Once | Status: AC
  Administered 2016-03-10: 17:00:00
  Filled 2016-03-10: qty 1

## 2016-03-10 MED ORDER — HEART ATTACK BOUNCING BOOK
Freq: Once | Status: AC
  Administered 2016-03-10: 04:00:00
  Filled 2016-03-10: qty 1

## 2016-03-10 MED ORDER — WARFARIN SODIUM 5 MG PO TABS
5.0000 mg | ORAL_TABLET | Freq: Once | ORAL | Status: AC
  Administered 2016-03-10: 5 mg via ORAL
  Filled 2016-03-10: qty 1

## 2016-03-10 MED ORDER — ANGIOPLASTY BOOK
Freq: Once | Status: AC
  Administered 2016-03-10: 04:00:00
  Filled 2016-03-10: qty 1

## 2016-03-10 MED ORDER — WARFARIN VIDEO: Freq: Once | Status: DC

## 2016-03-10 MED FILL — Heparin Sodium (Porcine) 2 Unit/ML in Sodium Chloride 0.9%: INTRAMUSCULAR | Qty: 500 | Status: AC

## 2016-03-10 MED FILL — Nitroglycerin IV Soln 100 MCG/ML in D5W: INTRA_ARTERIAL | Qty: 10 | Status: AC

## 2016-03-10 NOTE — Progress Notes (Addendum)
Patient stated leg bleeding. Upon assessment moderate amount of serosanguinous drainage soaked through 4 by 4 gauze, ABD, and Kerlex, bed sheets and blanket. 2 Staff RN's attempted to undress and assess site. Pt refused for any nurse to assess wound or redress, or reinforce. Educated on the importance of assessing the site and reinforcing the dressing, and the risks of bleeding while on heparin. Patient continued to refuse care. VSS. CN-Katrina to bedside. Reeducated on the importance of redressing and assessing wound especially while on heparin. Pt. Understood the risks and only wants MD to touch.  MD Edilia Bo notified and stated will come by to redress. Call bell within reach. Will continue to monitor.   Valinda Hoar RN

## 2016-03-10 NOTE — Progress Notes (Signed)
ANTICOAGULATION CONSULT NOTE  Pharmacy Consult for Heparin bridge and warfarin initiation Indication:  LV thrombus and R femoral embolism s/p embolectomy and now s/p cath with stent placement  No Known Allergies  Patient Measurements: Height: 5\' 8"  (172.7 cm) Weight: 209 lb 3.2 oz (94.892 kg) IBW/kg (Calculated) : 68.4  Heparin dosing wt: 87 kg  Labs:  Recent Labs  03/07/16 0808 03/08/16 0317 03/09/16 0412 03/09/16 0857 03/10/16 0340  HGB 8.4* 8.7* 8.9*  --  9.3*  HCT 25.7* 26.6* 27.4*  --  28.8*  PLT 425* 431* 447*  --  478*  LABPROT  --   --   --  17.6* 17.2*  INR  --   --   --  1.44 1.39  HEPARINUNFRC 0.44 0.34 0.47  --  0.19*  CREATININE 0.83 0.83  --   --  0.85   Estimated Creatinine Clearance: 108.4 mL/min (by C-G formula based on Cr of 0.85).  Assessment: 55 yo male with LV thrombus s/p R femoral embolectomy and subsequent hematoma evacuation, for heparin.  His heparin was turned off due to complications of bleeding but resumed on 7/8. Pt on heparin bridge to coumadin post cath. Heparin level down to subtherapeutic on 1300 units/hr. No issues with line or bleeding reported per RN.  Goal of Therapy:  Heparin level 0.3-0.5 units/ml  INR 2-3 Monitor platelets by anticoagulation protocol: Yes   Plan:  Increase heparin to 1450 units/hr F/u heparin level in 6 hours  Christoper Fabian, PharmD, BCPS Clinical pharmacist, pager [phone] 03/10/2016 5:00 AM

## 2016-03-10 NOTE — Progress Notes (Signed)
CARDIAC REHAB PHASE I   Pt in bed, has not ambulated since admission, awaiting PT progress prior to ambulation with cardiac rehab. Began MI/ stent education.  Reviewed risk factors, MI book, stent card, anti-platelet therapy, CHF booklet and zone tool and daily weights. Pt verbalized fair understanding, however, pt very difficult to focus in conversation, seems to have limited insight into his own disease process, pt repeatedly returning to discussion of an "incident" with his wife that occurred while pt was in the ICU. Pt states that his heart attack was caused by stress due to his separation from his wife. Will follow PT progress for ambulation and plan to continue education tomorrow. Pt in bed, call bell within reach.  [phone] Joylene Grapes, RN, BSN 03/10/2016 9:05 AM

## 2016-03-10 NOTE — Progress Notes (Addendum)
Physical Therapy Treatment Patient Details Name: Troy Yu MRN: [id number] DOB: 01/05/1961 Today's Date: 03/10/2016    History of Present Illness 55 y.o. male with no significant hx of cad/pvd/angina/claudication. 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 adm with complaints of red right leg pain; + NSTEMI subacute; EF 30-35%; +left ventricle thrombus; +Rt femoral embolism, 6/25 femoral embolectomy and four compartment calf fasciotomies; 6/26 Evacuation of hematoma from bilateral right calf fasciotomies with placement of negative pressure dressings x 2 with plan for partial wound closeure with VAC replacement on 02/26/16.  On 02/08/16 pt underwent successful angioplasty and drug-eluting stent placement to the distal right coronary artery and mid left anterior descending artery.    PT Comments    Pt continues to be limited by Rt LE pain and required max encouragement to participate in therapy.  He currently requires min +2 assist for safe short distance (28ft) ambulation, refusing to ambulate farther due to pain.  Again, educated pt on importance of mobility with ambulation and therapeutic exercises; however pt continues to refuse both of these activities.  Feel that pt not fully understanding implications of delaying mobility despite education.  SNF remains most appropriate plan at d/c.  Pt will benefit from continued skilled PT services to increase functional independence and safety.   Follow Up Recommendations  SNF;Supervision for mobility/OOB     Equipment Recommendations  Rolling walker with 5" wheels    Recommendations for Other Services       Precautions / Restrictions Precautions Precautions: Fall Restrictions Weight Bearing Restrictions: No RUE Weight Bearing: Non weight bearing    Mobility  Bed Mobility Overal bed mobility: Needs Assistance Bed Mobility: Supine to Sit      Supine to sit: Min guard;HOB elevated     General bed mobility comments: HOB elevated and pt uses bed rail with increased time  Transfers Overall transfer level: Needs assistance Equipment used: Rolling walker (2 wheeled) Transfers: Sit to/from [employer] Sit to Stand: Min assist;From elevated surface Stand pivot transfers: Min assist       General transfer comment: Cues for hand placement.  Pt not WB through Rt LE and requires min assist to steady.  Pt attempts to sit in chair prematurely, cues provided to back up all the way to the chair prior to sitting.  Ambulation/Gait Ambulation/Gait assistance: Min assist;+2 physical assistance;+2 safety/equipment Ambulation Distance (Feet): 4 Feet Assistive device: Rolling walker (2 wheeled) Gait Pattern/deviations:  (hop on Lt LE) Gait velocity: decreased   General Gait Details: Pt hopping on Lt LE refusing to place Rt foot on floor.  Pt c/o Rt LE pain and quickly begins backing up to recliner chair, requiring min +2 assist for safety and to steady.   Stairs            Wheelchair Mobility    Modified Rankin (Stroke Patients Only)       Balance Overall balance assessment: Needs assistance Sitting-balance support: No upper extremity supported;Feet supported Sitting balance-Leahy Scale: Good     Standing balance support: Bilateral upper extremity supported;During functional activity Standing balance-Leahy Scale: Poor Standing balance comment: Relies on RW and physical assist to steady                    Cognition Arousal/Alertness: Awake/alert Behavior During Therapy: Impulsive;WFL for tasks assessed/performed Overall Cognitive Status:  (poor safety awareness)  Exercises General Exercises - Lower Extremity Long Arc Quad: AROM;Both;5 reps;Seated;Other (comment) (pt lacking ~30 deg knee extension with LAQ)    General Comments General comments (skin integrity,  edema, etc.): Again, educated pt on importance of mobility with ambulation and therapeutic exercises; however pt continues to refuse both of these activities.  Pt not fully understanding implications of delaying mobility despite education.      Pertinent Vitals/Pain Pain Assessment: Faces Faces Pain Scale: Hurts even more Pain Location: Rt LE Pain Descriptors / Indicators: Constant;Grimacing;Guarding;Moaning Pain Intervention(s): Limited activity within patient's tolerance;Monitored during session;Repositioned    Home Living                      Prior Function            PT Goals (current goals can now be found in the care plan section) Acute Rehab PT Goals Patient Stated Goal: decreased pain PT Goal Formulation: With patient/family Time For Goal Achievement: 03/15/16 Potential to Achieve Goals: Fair Progress towards PT goals: Progressing toward goals (very modestly)    Frequency  Min 3X/week    PT Plan Current plan remains appropriate    Co-evaluation             End of Session Equipment Utilized During Treatment: Gait belt Activity Tolerance: Patient limited by pain Patient left: in chair;with call bell/phone within reach;with chair alarm set;with family/visitor present     Time: 1431-1453 PT Time Calculation (min) (ACUTE ONLY): 22 min  Charges:  $Therapeutic Activity: 8-22 mins                    G Codes:      Encarnacion Chu PT, DPT  Pager: [phone] Phone: [phone] 03/10/2016, 4:24 PM

## 2016-03-10 NOTE — Progress Notes (Addendum)
ANTICOAGULATION CONSULT NOTE  Pharmacy Consult for Heparin bridge and warfarin initiation Indication:  LV thrombus and R femoral embolism s/p embolectomy and now s/p cath with stent placement  No Known Allergies  Patient Measurements: Height: 5\' 8"  (172.7 cm) Weight: 207 lb 3.7 oz (94 kg) IBW/kg (Calculated) : 68.4  Heparin dosing wt: 87 kg  Labs:  Recent Labs  03/08/16 0317 03/09/16 0412 03/09/16 0857 03/10/16 0340 03/10/16 1116 03/10/16 1901  HGB 8.7* 8.9*  --  9.3*  --   --   HCT 26.6* 27.4*  --  28.8*  --   --   PLT 431* 447*  --  478*  --   --   LABPROT  --   --  17.6* 17.2*  --   --   INR  --   --  1.44 1.39  --   --   HEPARINUNFRC 0.34 0.47  --  0.19* 0.52 0.57  CREATININE 0.83  --   --  0.85  --   --    Estimated Creatinine Clearance: 107.9 mL/min (by C-G formula based on Cr of 0.85).  Assessment: 55 yo male with LV thrombus s/p R femoral embolectomy and subsequent hematoma evacuation, for heparin.  His heparin was turned off due to complications of bleeding but resumed on 7/8. Currently on heparin bridge to coumadin post cath 03/09/16. Also on Plavix and ASA 81 mg.  Hematoma noted hardened, not enlarging and without active bleeding.  Heparin level up to 0.52 on 1450 units/hr. RN reports infusing as ordered, though change in rate not documented at ~5am.  INR 1.39 after Coumadin 7.5 mg x 1 yesterday.  HL remains supratherapeutic at 0.57 on heparin 1400. No issues with infusion or bleeding noted.  Goal of Therapy:  Heparin level 0.3-0.5 units/ml  INR 2-3 Monitor platelets by anticoagulation protocol: Yes   Plan:  Reduce heparin to 1300 units/hr Daily HL/CBC/INR Monitor s/sx of bleeding  Arlean Hopping. Newman Pies, PharmD, BCPS Clinical Pharmacist Pager [phone] 03/10/2016 7:48 PM   ADDN: Pt is having bleeding from the right leg. Will hold heparin for 3 hours per Dr. Edilia Bo then restart at 1300 units/hr.   Arlean Hopping. Newman Pies, PharmD, BCPS Clinical Pharmacist Pager  [phone]

## 2016-03-10 NOTE — Progress Notes (Signed)
  [facility] Progress Note  Subjective    No complaints.   Objective Filed Vitals:   03/10/16 0837 03/10/16 1131  BP: 110/61 108/73  Pulse: 88 88  Temp: 98.3 F (36.8 C) 98.2 F (36.8 C)  Resp: 24 28    Intake/Output Summary (Last 24 hours) at 03/10/16 1139 Last data filed at 03/10/16 0838  Gross per 24 hour  Intake   1010 ml  Output   3220 ml  Net  -2210 ml   2-3+ right DP pulse. Right medial calf wound clean with pink granulation tissue. Some old clot to medial aspect of wound.   Assessment/Planning: 55 y.o. male is s/p:   Continue daily dressing changes with hydrogel and moist saline guaze.  Patient is not interested in skin grafting. Continue with local wound care.   Raymond Gurney 03/10/2016 11:39 AM --  Laboratory CBC    Component Value Date/Time   WBC 6.8 03/10/2016 0340   HGB 9.3* 03/10/2016 0340   HCT 28.8* 03/10/2016 0340   PLT 478* 03/10/2016 0340    BMET    Component Value Date/Time   NA 133* 03/10/2016 0340   K 3.5 03/10/2016 0340   CL 103 03/10/2016 0340   CO2 24 03/10/2016 0340   GLUCOSE 88 03/10/2016 0340   BUN <5* 03/10/2016 0340   CREATININE 0.85 03/10/2016 0340   CALCIUM 7.6* 03/10/2016 0340   GFRNONAA >60 03/10/2016 0340   GFRAA >60 03/10/2016 0340    COAG Lab Results  Component Value Date   INR 1.39 03/10/2016   INR 1.44 03/09/2016   INR 1.11 02/21/2016   No results found for: PTT  Antibiotics Anti-infectives    Start     Dose/Rate Route Frequency Ordered Stop   02/26/16 0930  [MAR Hold]  cefUROXime (ZINACEF) 1.5 g in dextrose 5 % 50 mL IVPB     (MAR Hold since 02/26/16 0928)   1.5 g 100 mL/hr over 30 Minutes Intravenous To ShortStay Surgical 02/25/16 1055 02/26/16 1120   02/22/16 0800  cefUROXime (ZINACEF) 1.5 g in dextrose 5 % 50 mL IVPB     1.5 g 100 mL/hr over 30 Minutes Intravenous Every 12 hours 02/21/16 2115 02/22/16 2140   02/22/16 0600  cefUROXime (ZINACEF) injection 1.5 g     1.5 g  Intramuscular On call to O.R. 02/21/16 1523 02/21/16 1713       Maris Berger, PA-C [facility] Office: [phone] Pager: [phone] 03/10/2016 11:39 AM

## 2016-03-10 NOTE — Progress Notes (Signed)
Tx to 2W32 from [facility]. Patient alert and oriented times 4. Kids at the bedside. Patient oriented to room and unit. IV Heparin at 14 mL/hr. Patient states pain in right leg 4/10. No SOB, No Chest pain. Tele monitor placed. CCMD notified. VSS. Call bell within reach. Will continue to monitor.   Valinda Hoar RN

## 2016-03-10 NOTE — Progress Notes (Signed)
Patient Name: Troy Yu Date of Encounter: 03/10/2016  Principal Problem:   LV (left ventricular) mural thrombus (HCC) Active Problems:   Anterior subendocardial MI (HCC)   Right leg pain   Non-STEMI (non-ST elevated myocardial infarction) (HCC)   Elevated troponin   Abnormal EKG   Diabetes (HCC)   Acute MI (HCC)   History of embolectomy   Pain   Thromboembolism (HCC)   Vascular occlusion   Fever   NSTEMI (non-ST elevated myocardial infarction) (HCC)   Chest pain   Abdominal pain   Primary Cardiologist:  Patient Profile: [name] is a 1M with diabetes here with subacute anterior MI complicated by LV thrombus and right femoral artery embolism now s/p embolectomy and R calf fasciotomy for compartment syndrome.This was complicated by post operative anemia. Wound vac now removed and patient refusing further intervention by plastic surgery. He underwent LHC on 03/09/16 that revealed significant RCA and LAD stenoses that were successfully treated with DES.  SUBJECTIVE: Feels well, denies chest pain and SOB.   OBJECTIVE Filed Vitals:   03/09/16 1800 03/09/16 2009 03/10/16 0640 03/10/16 0837  BP: 122/68 98/67 104/63 110/61  Pulse: 81 81 78 88  Temp:  98.3 F (36.8 C) 98.3 F (36.8 C) 98.3 F (36.8 C)  TempSrc:  Oral Oral Oral  Resp: Height:      Weight:   207 lb 3.7 oz (94 kg)   SpO2: 94% 97% 93% 96%    Intake/Output Summary (Last 24 hours) at 03/10/16 0942 Last data filed at 03/10/16 0838  Gross per 24 hour  Intake   1010 ml  Output   5020 ml  Net  -4010 ml   Filed Weights   03/08/16 0500 03/09/16 0457 03/10/16 0640  Weight: 209 lb 7 oz (95 kg) 209 lb 3.2 oz (94.892 kg) 207 lb 3.7 oz (94 kg)    PHYSICAL EXAM General: Well developed, well nourished, male in no acute distress. Head: Normocephalic, atraumatic.  Neck: Supple without bruits, no JVD. Lungs:  Resp regular and unlabored, CTA. Heart: RRR, S1, S2, no S3, S4, or murmur;  no rub. Abdomen: Soft, non-tender, non-distended, BS + x 4.  Extremities: No clubbing, cyanosis, no edema. Right lower leg wrapped.  Neuro: Alert and oriented X 3. Moves all extremities spontaneously. Psych: Normal affect  LABS: CBC: Recent Labs  03/09/16 0412 03/10/16 0340  WBC 6.7 6.8  HGB 8.9* 9.3*  HCT 27.4* 28.8*  MCV 90.7 90.6  PLT 447* 478*   INR: Recent Labs  03/10/16 0340  INR 1.39   Basic Metabolic Panel: Recent Labs  03/08/16 0317 03/10/16 0340  NA 135 133*  K 3.5 3.5  CL 107 103  CO2 22 24  GLUCOSE 104* 88  BUN 5* <5*  CREATININE 0.83 0.85  CALCIUM 7.3* 7.6*     Current facility-administered medications:  .  0.9 %  sodium chloride infusion, , Intravenous, PRN, Edsel Petrin, DO, Last Rate: 10 mL/hr at 03/07/16 1900 .  0.9 %  sodium chloride infusion, 250 mL, Intravenous, PRN, Iran Ouch, MD .  acetaminophen (TYLENOL) tablet 650 mg, 650 mg, Oral, Q6H PRN, 650 mg at 03/08/16 2313 **OR** acetaminophen (TYLENOL) suppository 650 mg, 650 mg, Rectal, Q6H PRN, Lesle Chris Black, NP .  alum & mag hydroxide-simeth (MAALOX/MYLANTA) 200-200-20 MG/5ML suspension 15-30 mL, 15-30 mL, Oral, Q2H PRN, Fransisco Hertz, MD, 30 mL at 03/03/16 1724 .  aspirin EC tablet 81 mg, 81 mg,  Oral, Daily, Kathlen Mody, MD, 81 mg at 03/10/16 0859 .  atorvastatin (LIPITOR) tablet 80 mg, 80 mg, Oral, q1800, Azalee Course, PA, 80 mg at 03/09/16 1637 .  bisacodyl (DULCOLAX) suppository 10 mg, 10 mg, Rectal, Daily PRN, Fransisco Hertz, MD, 10 mg at 03/06/16 1802 .  carvedilol (COREG) tablet 6.25 mg, 6.25 mg, Oral, BID WC, Iran Ouch, MD, 6.25 mg at 03/10/16 0903 .  clopidogrel (PLAVIX) tablet 75 mg, 75 mg, Oral, Q breakfast, Iran Ouch, MD, 75 mg at 03/10/16 0903 .  docusate sodium (COLACE) capsule 100 mg, 100 mg, Oral, Daily, Fransisco Hertz, MD, 100 mg at 03/10/16 0858 .  heparin ADULT infusion 100 units/mL (25000 units/252mL sodium chloride 0.45%), 1,450 Units/hr, Intravenous,  Continuous, Titus Mould, [facility], Last Rate: 13 mL/hr at 03/09/16 2235, 1,300 Units/hr at 03/09/16 2235 .  HYDROmorphone (DILAUDID) injection 1-1.5 mg, 1-1.5 mg, Intravenous, Q2H PRN, Kathlen Mody, MD, 1 mg at 03/10/16 0659 .  insulin aspart (novoLOG) injection 0-15 Units, 0-15 Units, Subcutaneous, TID WC, Maryruth Bun Rama, MD, 3 Units at 03/09/16 1859 .  insulin aspart (novoLOG) injection 0-5 Units, 0-5 Units, Subcutaneous, QHS, Maryruth Bun Rama, MD, 2 Units at 03/02/16 2237 .  insulin aspart (novoLOG) injection 6 Units, 6 Units, Subcutaneous, TID WC, Maryann Mikhail, DO, 6 Units at 03/08/16 1301 .  insulin glargine (LANTUS) injection 20 Units, 20 Units, Subcutaneous, QHS, Maryann Mikhail, DO, 20 Units at 03/09/16 2211 .  labetalol (NORMODYNE,TRANDATE) injection 10 mg, 10 mg, Intravenous, Q10 min PRN, Fransisco Hertz, MD .  magnesium hydroxide (MILK OF MAGNESIA) suspension 30 mL, 30 mL, Oral, Daily PRN, Kathlen Mody, MD .  nitroGLYCERIN 50 mg in dextrose 5 % 250 mL (0.2 mg/mL) infusion, 0-200 mcg/min, Intravenous, Titrated, Little Ishikawa, NP, Stopped at 03/08/16 1200 .  ondansetron (ZOFRAN) tablet 4 mg, 4 mg, Oral, Q6H PRN **OR** ondansetron (ZOFRAN) injection 4 mg, 4 mg, Intravenous, Q6H PRN, Gwenyth Bender, NP, 4 mg at 02/22/16 1009 .  oxyCODONE-acetaminophen (PERCOCET/ROXICET) 5-325 MG per tablet 1-2 tablet, 1-2 tablet, Oral, Q4H PRN, Fransisco Hertz, MD, 1 tablet at 03/10/16 0859 .  pantoprazole (PROTONIX) EC tablet 40 mg, 40 mg, Oral, BID AC, Kathlen Mody, MD, 40 mg at 03/10/16 0859 .  polyethylene glycol (MIRALAX / GLYCOLAX) packet 17 g, 17 g, Oral, Daily PRN, Fransisco Hertz, MD, 17 g at 02/24/16 1831 .  promethazine (PHENERGAN) injection 12.5 mg, 12.5 mg, Intravenous, Q6H PRN, Chrystie Nose, MD, 12.5 mg at 03/03/16 2112 .  simethicone (MYLICON) chewable tablet 80 mg, 80 mg, Oral, Q6H PRN, Kathlen Mody, MD .  sodium chloride flush (NS) 0.9 % injection 3 mL, 3 mL, Intravenous, Q12H, Iran Ouch, MD,  3 mL at 03/09/16 1901 .  sodium chloride flush (NS) 0.9 % injection 3 mL, 3 mL, Intravenous, PRN, Iran Ouch, MD .  sodium phosphate (FLEET) 7-19 GM/118ML enema 1 enema, 1 enema, Rectal, Daily PRN, Kathlen Mody, MD .  Warfarin - Pharmacist Dosing Inpatient, , Does not apply, q1800, Edsel Petrin, DO . heparin 1,300 Units/hr (03/09/16 2235)  . nitroGLYCERIN Stopped (03/08/16 1200)    TELE:  NSR      ECG: NSR, T wave inversion in anterolateral leads.   Coronary Stent Intervention Intravascular Pressure Wire/FFR Study Left Heart Cath and Coronary Angiography 03/09/16   Prox RCA lesion, 10% stenosed.  Dist LAD lesion, 60% stenosed.  Dist RCA lesion, 90% stenosed. Post intervention, there is a 0% residual stenosis.  Mid LAD lesion, 60% stenosed. Post intervention, there is a 0% residual stenosis.  1. Significant 2 vessel coronary artery disease involving the distal right coronary artery and mid LAD. The culprit for recent myocardial infarction seems to be the mid LAD stenosis which appears to be thrombotic and hazy in spite of being only 60% in severity. However, this was significant by FFR.  2. Moderately elevated left ventricular end-diastolic pressure. LV angiography was not performed due to LV thrombus.  3. Successful angioplasty and drug-eluting stent placement to the distal right coronary artery and mid left anterior descending artery.   Recommendations: Recommend treatment with aspirin, Plavix and warfarin for one month. After one month, aspirin can be discontinued. Heparin can be resumed today 8 hours after sheath pull. Warfarin can be started. I switched from propranolol to carvedilol. Continue treatment for cardiomyopathy and add an ACE inhibitor or ARB before hospital discharge. There was residual 60% distal LAD stenosis which was left to be treated medically.        Current Medications:  . aspirin EC  81 mg Oral Daily  . atorvastatin  80 mg Oral q1800  .  carvedilol  6.25 mg Oral BID WC  . clopidogrel  75 mg Oral Q breakfast  . docusate sodium  100 mg Oral Daily  . insulin aspart  0-15 Units Subcutaneous TID WC  . insulin aspart  0-5 Units Subcutaneous QHS  . insulin aspart  6 Units Subcutaneous TID WC  . insulin glargine  20 Units Subcutaneous QHS  . pantoprazole  40 mg Oral BID AC  . sodium chloride flush  3 mL Intravenous Q12H  . Warfarin - Pharmacist Dosing Inpatient   Does not apply q1800   . heparin 1,300 Units/hr (03/09/16 2235)  . nitroGLYCERIN Stopped (03/08/16 1200)    ASSESSMENT AND PLAN: Principal Problem:   LV (left ventricular) mural thrombus (HCC) Active Problems:   Anterior subendocardial MI (HCC)   Right leg pain   Non-STEMI (non-ST elevated myocardial infarction) (HCC)   Elevated troponin   Abnormal EKG   Diabetes (HCC)   Acute MI (HCC)   History of embolectomy   Pain   Thromboembolism (HCC)   Vascular occlusion   Fever   NSTEMI (non-ST elevated myocardial infarction) (HCC)   Chest pain   Abdominal pain  1. Subacute anterior MI - echo this admission shows and anteroapical infarct with EF of 30-35%, consistent with an anterior MI. H/H is now stable and no additional surgeries are planned at this time.  - Insight remains poor, as he continues to think this was caused by dehydration and being in hot weather.Father had CABG in his 49s. - DES to distal RCA and mid LAD. - Will need ASA, Plavix and warfarin for one month, then discontinue ASA after one month.  - Can add low dose ACE today.  - Right groin, level one with hematoma, no bruits. RN to assess for signs of active bleeding.   2. LV thrombus - Continue heparin, warfarin restarted yesterday. INR is 1.39 today. Goal is 2-3.   3. R LE embolism s/p embolectomy and fasciotomy - per Vascular Surgery.   4. Post-operative anemia - Hgb stable at 8.9 this AM. Last transfusion 03/03/16.    Signed, Little Ishikawa , NP 9:42 AM 03/10/2016 Pager  [phone]

## 2016-03-10 NOTE — Progress Notes (Addendum)
Smith NP notified about right groin hematoma/hardened area. Area has been outlined and no active bleeding noted. At 0835, Katrinka Blazing NP at bedside and states to clean area with iodine and apply dressing. Right groin cleaned and dressing applied per verbal order. Smith NP states no bruit is noted and to continue to monitor area. No s/s of distress noted or complaints voiced at this time. Call bell is in reach and bed is in lowest position. Patient's son is at bedside. Patient has been educated on post op care and pneumonia prevention. IBE provided and used with teach back.

## 2016-03-10 NOTE — Progress Notes (Signed)
   VASCULAR SURGERY:  Called because of bleeding from right leg fasciotomy site. 2 staff nurses asked to change dressing and assess wound, but patient refused. Patient stated that he only wanted MD to change dressing so I came in to assess. He has moderate bleeding from medial wound fasciotomy site with drainage on bed. He refuses to let me change dressing and assess wound. I have spent a long time trying to explain the importance of assessing the wound in order to make the best decision about how to manage the bleeding. I have explained that we are all trying to help him, but he is making it difficult by not allowing [place] to do what we think is best.   Pt is on heparin and warfarin is being started for LV thrombus and h/o right femoral embolus. Will hold heparin for 2 hours then restart slowly. Will need to go very slow with warfarin.   Also, pt had PCI of RCA and LAD and needs to be on ASA & Plavix for this reason. So, this cannont be held.   CBC in AM  SUBJECTIVE: Refusing to let me change dressing.   PHYSICAL EXAM: Filed Vitals:   03/10/16 0640 03/10/16 0837 03/10/16 1131 03/10/16 1555  BP: 104/63 110/61 108/73 111/67  Pulse: 78 88 88 84  Temp: 98.3 F (36.8 C) 98.3 F (36.8 C) 98.2 F (36.8 C) 98.4 F (36.9 C)  TempSrc: Oral Oral Oral Oral  Resp: 16 24 28 24   Height:      Weight: 207 lb 3.7 oz (94 kg)     SpO2: 93% 96% 94% 95%   Bloody drainage on dressing to the right leg and on bed.   LABS: Lab Results  Component Value Date   WBC 6.8 03/10/2016   HGB 9.3* 03/10/2016   HCT 28.8* 03/10/2016   MCV 90.6 03/10/2016   PLT 478* 03/10/2016   Lab Results  Component Value Date   CREATININE 0.85 03/10/2016   Lab Results  Component Value Date   INR 1.39 03/10/2016   CBG (last 3)   Recent Labs  03/10/16 0638 03/10/16 1128 03/10/16 1511  GLUCAP 79 90 126*    Principal Problem:   LV (left ventricular) mural thrombus (HCC) Active Problems:   Anterior subendocardial  MI (HCC)   Right leg pain   Non-STEMI (non-ST elevated myocardial infarction) (HCC)   Elevated troponin   Abnormal EKG   Diabetes (HCC)   Acute MI (HCC)   History of embolectomy   Pain   Thromboembolism (HCC)   Vascular occlusion   Fever   NSTEMI (non-ST elevated myocardial infarction) (HCC)   Chest pain   Abdominal pain   Acute combined systolic and diastolic heart failure (HCC)   Abnormal CXR    Cari Caraway Beeper: [phone] 03/10/2016

## 2016-03-10 NOTE — Progress Notes (Signed)
PROGRESS NOTE    Troy Yu  KGU:[id number] DOB: 13-Feb-1961 DOA: 02/21/2016 PCP: No primary care provider on file.   Brief Narrative:  Troy Yu is an 55 y.o. male who was in his usual state of health until about 2 weeks ago when he had substernal chest pain that lasted about 48 hours.The pain went away after he vomited, and he attributed his symptoms to a prolonged religious fast. He then come to the [facility] ED on 02/21/16 with severe RLE pain associated with paresthesias. Upon presentation, he was found to have an abnormal EKG and elevated troponin. Cardiology was subsequently called to evaluate the patient, and he was felt to have suffered a subacute anterior MI. STAT echo was ordered and EDP advised to order an arterial duplex with recommendation to consult vascular surgery. IV heparin ordered and the patient was transferred to [facility] for further evaluation by vascular surgery, as it was felt that he had a right femoral embolism with concern for threatened limb. Dr Imogene Burn subsequently performed a right femoral embolectomy and right calf compartment fasciotomies x 4 . At 4:16 a.m. On 02/22/16, the patient complained of loss of sensation to RLE, and the patient reports he was in severe pain. He was given pain medication which did not alleviate his symptoms. He was re-evaluated by Dr. Imogene Burn at 7:33 a.m. Who removed the ACE wrap from his RLE which relieved the pain, and the patient was felt to have excessive bleeding in his calf related to full anti-coagulation. He was taken back to surgery for evacuation of hematoma and VAC dressings. Cardiologist continues to follow for management of his subacute MI. Patient underwent closure of lateral fasciotomy. Plastic surgery consulted and recommendations given. 7/6 patient's hemoglobin dropped to 5 from continuous oozing from the leg wound, and his heparin was stopped on 7/5. Dr Early recommended surgical evaluation by plastic surgery, but patient does not want  any further surgery at this time and wanted to address the bleeding from the leg wound with local wound care. Explained to him that he needs IV heparin for his LV thrombus and he is at risk from cardiac complications without IV heparin. And at the same time he continues to bleed on IV heparin, he needs the Leg wound to be evaluated by the plastic surgery for possible skin graft surgery.  7/7 pt 's hemoglobin improved to 10 , and restarted IV heparin on 7/8, as oozing from the leg has improved and hemoglobin stable at 9.7 7/9 abdominal distention, and abd film shows gas filled small bowel loops, recommended to get OOB and ambulate.  Fevers on and off over the 24 to 36 hrs, blood culture, CXR, UA, and CT abdomen ordered .  7/10 work up revealed CT abd showing cholelithiasis with fingings concerning for early acute cholecystitis but currently he is asymptomatic. US abdomen ordered.   7/11 US abdomen shows a gall stone in the gall bladder neck but he remains asymptomatic at this time . Discussed with Dr Duke Salvia And schedule for cardiac catheterization tomorrow if patient agrees.  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  -Case taken over by Dr. Arbie Cookey after patient requested a second opinion.  -s/p closure of lateral fasciotomy site and placement of VAC  on medial fasciotomy site -Plastic surgery consulted by vascular for possible STSG to medial fasciotomy site, but patient refusing any further surgical procedures.  -Wound vac removed (medial) and Daily hydrogel application and wet-to-dry dressings - 7/4 leg wound continues to ooze blood and dressing changed several times and Dr  Early contacted and IV heparin was discontinued.  - 7/6 his hemoglobin dropped to 5 and he received 3 units of prbc transfusion. Repeat hemoglobin improved .  - 7/8 heparin restarted as his hemoglobin is stable and oozing from the wound has decreased.  -7/10 no changes in management.  -Coumadin started 7/12. Discontinue heparin when INR is 2-3.  Symptomatic anemia/ Postoperative anemia/ Anemia secondary to blood loss -During hospitalization, hemoglobin has dropped to as low as 4.7 , continues to drop , requiring many prbc transfusions.  -Has received a total of 10 u PRBCs this admission -Hemoglobin 5 on 7/6 and he received 3 units of prbc transfusion  -anemia from blood loss from continuous oozing from the leg wound. IV heparin was held on 7/5.  -restarted IV heparin on 7/8, as oozing from leg wound improved. -transfuse to keep hemoglobin greater than 8.  -hemoglobin today 9.3  Syncope and orthostasis -Likely secondary to blood loss -Continue to monitor closely  Anterior subendocardial MI (HCC)/Abnormal EKG/Elevated troponin -On therapeutic dose heparin, which was held on 7/5 for continuous oozing from the leg wound. Restarted IV heparin on 7/8 as his oozing from the leg wound decreased and his hemoglobin is stable.  -Cardiology consulted and appreciated -ACE-I to be started when BP able to tolerate -s/p DES to distal RCA and mid LAD -Started on plavix, aspririn, coumadin (discontinue aspirin after 1 month)  Newly diagnosed diabetes mellitus/Hyperglycemia -Hemoglobin A1c 9.2.  -Continue lantus, ISS and CBG monitoring.  -Diabetes coordinator consulted  Hyponatremia -Serum osm 273 , urine osmo, TSH and am cortisol normal limits. .  -Continue to monitor BMP intermittently.  Abdominal distention, and constipation: -patientreported abdominal pain and feeling bloated. AXR shows air filled small bowels, probably from small bowel dysmotility. Recommended he get out of bed and  ambulate which will help relieve the gas, and help with constipation. -Improved with enema  Fevers  -on and OFF associated with chills, none today.  -Initially thought to be from blood transfusions -Septic work up including: Blood cultures ordered, CXR, UA, and CT abd and pelvis without contrast ordered for further evaluation. CT abd shows cholelithiasis with fingings concerning for early acute cholecystitis.  -US abdomen ordered, which showed mildly dilated gall bladder, with a 9 mm gall bladder stone impacted in the necko f the gall bladder, no pericholecystic fluid no wall thickening. Negative murphy's sign. He is currently asymptomatic. Monitor. No further intervention needed at this time.   DVT Prophylaxis  Heparin   Code Status: Full  Family Communication:  Son at bedside  Disposition Plan: Admitted. Pending therapeutic INR. Transfer to 2W.  Consultants Cardiology  Vascular Surgery Plastic surgery  Procedures  Right femoral embolectomy Right calf four compartment fasciotomies  Evacuation of hematoma from bilateral right calf fasciotomies Placement of negative pressure dressings x2 Closure of lateral fasciotomy site and placement of VAC on medial fasciotomy site Cardiac catheterization, DES to distal RCA and Mid LAD  Antibiotics   Anti-infectives    Start     Dose/Rate Route Frequency Ordered Stop   02/26/16 0930  [MAR Hold]  cefUROXime (ZINACEF) 1.5 g in dextrose 5 % 50 mL IVPB     (MAR Hold since 02/26/16 6578)  1.5 g 100 mL/hr over 30 Minutes Intravenous To ShortStay Surgical 02/25/16 1055 02/26/16 1120   02/22/16 0800  cefUROXime (ZINACEF) 1.5 g in dextrose 5 % 50 mL IVPB     1.5 g 100 mL/hr over 30 Minutes Intravenous Every 12 hours 02/21/16 2115 02/22/16 2140   02/22/16 0600  cefUROXime (ZINACEF) injection 1.5 g     1.5 g Intramuscular On call to O.R. 02/21/16 1523 02/21/16 1713      Subjective:   Dariel Rohl seen and examined today.  No new  complaints today.  Tired of being moved from to room to room.  Denies chest pain, shortness of breath, abdominal pain, nausea, vomiting, constipation, diarrhea, headache, dizziness.  Complains of leg pain.    Objective:   Filed Vitals:   03/09/16 1800 03/09/16 2009 03/10/16 0640 03/10/16 0837  BP: 122/68 98/67 104/63 110/61  Pulse: 81 81 78 88  Temp:  98.3 F (36.8 C) 98.3 F (36.8 C) 98.3 F (36.8 C)  TempSrc:  Oral Oral Oral  Resp: Height:      Weight:   94 kg (207 lb 3.7 oz)   SpO2: 94% 97% 93% 96%    Intake/Output Summary (Last 24 hours) at 03/10/16 1051 Last data filed at 03/10/16 0838  Gross per 24 hour  Intake   1010 ml  Output   3220 ml  Net  -2210 ml   Filed Weights   03/08/16 0500 03/09/16 0457 03/10/16 0640  Weight: 95 kg (209 lb 7 oz) 94.892 kg (209 lb 3.2 oz) 94 kg (207 lb 3.7 oz)    Exam  General: Well developed, well nourished, NAD  HEENT: NCAT, mucous membranes moist.   Cardiovascular: S1 S2 auscultated, RRR, no murmurs  Respiratory: Clear to auscultation bilaterally with equal chest rise  Abdomen: Soft, nontender, nondistended, + bowel sounds  Extremities: warm dry without cyanosis clubbing or edema. RLE wrapped  Neuro: AAOx3, nonfocal  Psych: Normal affect and demeanor, pleasant   Data Reviewed: I have personally reviewed following labs and imaging studies  CBC:  Recent Labs Lab 03/06/16 0410 03/07/16 0808 03/08/16 0317 03/09/16 0412 03/10/16 0340  WBC 15.2* 11.4* 8.5 6.7 6.8  HGB 8.9* 8.4* 8.7* 8.9* 9.3*  HCT 27.8* 25.7* 26.6* 27.4* 28.8*  MCV 92.4 90.8 92.0 90.7 90.6  PLT 345 425* 431* 447* 478*   Basic Metabolic Panel:  Recent Labs Lab 03/05/16 2345 03/06/16 0410 03/07/16 0808 03/08/16 0317 03/10/16 0340  NA 132* 130* 136 135 133*  K 4.9 3.7 3.5 3.5 3.5  CL 103 101 105 107 103  CO2 GLUCOSE 198* 197* 96 104* 88  BUN 11 11 5* 5* <5*  CREATININE 0.88 0.85 0.83 0.83 0.85  CALCIUM 7.3* 7.3*  7.6* 7.3* 7.6*   GFR: Estimated Creatinine Clearance: 107.9 mL/min (by C-G formula based on Cr of 0.85). Liver Function Tests:  Recent Labs Lab 03/05/16 2345  AST 66*  ALT 16*  ALKPHOS 61  BILITOT 1.9*  PROT 5.2*  ALBUMIN 1.6*    Recent Labs Lab 03/05/16 2345  LIPASE 58*   No results for input(s): AMMONIA in the last 168 hours. Coagulation Profile:  Recent Labs Lab 03/09/16 0857 03/10/16 0340  INR 1.44 1.39   Cardiac Enzymes: No results for input(s): CKTOTAL, CKMB, CKMBINDEX, TROPONINI in the last 168 hours. BNP (last 3 results) No results for input(s): PROBNP in the last 8760 hours. HbA1C: No results for input(s): HGBA1C in  the last 72 hours. CBG:  Recent Labs Lab 03/09/16 0611 03/09/16 1324 03/09/16 1725 03/09/16 2205 03/10/16 0638  GLUCAP 93 95 168* 144* 79   Lipid Profile: No results for input(s): CHOL, HDL, LDLCALC, TRIG, CHOLHDL, LDLDIRECT in the last 72 hours. Thyroid Function Tests: No results for input(s): TSH, T4TOTAL, FREET4, T3FREE, THYROIDAB in the last 72 hours. Anemia Panel: No results for input(s): VITAMINB12, FOLATE, FERRITIN, TIBC, IRON, RETICCTPCT in the last 72 hours. Urine analysis:    Component Value Date/Time   COLORURINE YELLOW 03/06/2016 1500   APPEARANCEUR CLEAR 03/06/2016 1500   LABSPEC 1.008 03/06/2016 1500   PHURINE 6.5 03/06/2016 1500   GLUCOSEU NEGATIVE 03/06/2016 1500   HGBUR NEGATIVE 03/06/2016 1500   BILIRUBINUR NEGATIVE 03/06/2016 1500   KETONESUR NEGATIVE 03/06/2016 1500   PROTEINUR NEGATIVE 03/06/2016 1500   NITRITE NEGATIVE 03/06/2016 1500   LEUKOCYTESUR NEGATIVE 03/06/2016 1500   Sepsis Labs: @LABRCNTIP (procalcitonin:4,lacticidven:4)  ) Recent Results (from the past 240 hour(s))  Culture, blood (Routine X 2) w Reflex to ID Panel     Status: None (Preliminary result)   Collection Time: 03/06/16 10:40 AM  Result Value Ref Range Status   Specimen Description BLOOD BLOOD LEFT ARM  Final   Special Requests  IN PEDIATRIC BOTTLE 1CC  Final   Culture NO GROWTH 3 DAYS  Final   Report Status PENDING  Incomplete  Culture, blood (Routine X 2) w Reflex to ID Panel     Status: None (Preliminary result)   Collection Time: 03/06/16 10:45 AM  Result Value Ref Range Status   Specimen Description BLOOD BLOOD LEFT ARM  Final   Special Requests IN PEDIATRIC BOTTLE 1CC  Final   Culture NO GROWTH 3 DAYS  Final   Report Status PENDING  Incomplete      Radiology Studies: No results found.   Scheduled Meds: . aspirin EC  81 mg Oral Daily  . atorvastatin  80 mg Oral q1800  . carvedilol  6.25 mg Oral BID WC  . clopidogrel  75 mg Oral Q breakfast  . docusate sodium  100 mg Oral Daily  . insulin aspart  0-15 Units Subcutaneous TID WC  . insulin aspart  0-5 Units Subcutaneous QHS  . insulin aspart  6 Units Subcutaneous TID WC  . insulin glargine  20 Units Subcutaneous QHS  . pantoprazole  40 mg Oral BID AC  . sodium chloride flush  3 mL Intravenous Q12H  . Warfarin - Pharmacist Dosing Inpatient   Does not apply q1800   Continuous Infusions: . heparin 1,300 Units/hr (03/09/16 2235)  . nitroGLYCERIN Stopped (03/08/16 1200)     LOS: 18 days   Time Spent in minutes   30 minutes  Ka Flammer D.O. on 03/10/2016 at 10:51 AM  Between 7am to 7pm - Pager - [phone]  After 7pm go to [email or web address] - password [email or web address]  And look for the night coverage person covering for me after hours  [facility] Office  [phone]

## 2016-03-10 NOTE — Progress Notes (Signed)
Pt still refusing to have dressing changed. Pt bleed through previous dressing and new bedding. Restarted heparin per order.

## 2016-03-10 NOTE — Progress Notes (Addendum)
Pt refused to have dressing changed. Pt allowed the dressing to be reinforced with gauze wrap only. Changed bedding. Stopped heparin per order. Will continue to monitor.

## 2016-03-10 NOTE — Progress Notes (Signed)
RN to get dressing supplies in the room.  Pt is receiving pain medication at this time.  I will be back in just a little bit to change his dressings on his leg.   Doreatha Massed, [facility] 03/10/2016 9:03 AM

## 2016-03-10 NOTE — Progress Notes (Signed)
ANTICOAGULATION CONSULT NOTE  Pharmacy Consult for Heparin bridge and warfarin initiation Indication:  LV thrombus and R femoral embolism s/p embolectomy and now s/p cath with stent placement  No Known Allergies  Patient Measurements: Height: 5\' 8"  (172.7 cm) Weight: 207 lb 3.7 oz (94 kg) IBW/kg (Calculated) : 68.4  Heparin dosing wt: 87 kg  Labs:  Recent Labs  03/08/16 0317 03/09/16 0412 03/09/16 0857 03/10/16 0340 03/10/16 1116  HGB 8.7* 8.9*  --  9.3*  --   HCT 26.6* 27.4*  --  28.8*  --   PLT 431* 447*  --  478*  --   LABPROT  --   --  17.6* 17.2*  --   INR  --   --  1.44 1.39  --   HEPARINUNFRC 0.34 0.47  --  0.19* 0.52  CREATININE 0.83  --   --  0.85  --    Estimated Creatinine Clearance: 107.9 mL/min (by C-G formula based on Cr of 0.85).  Assessment: 55 yo male with LV thrombus s/p R femoral embolectomy and subsequent hematoma evacuation, for heparin.  His heparin was turned off due to complications of bleeding but resumed on 7/8. Currently on heparin bridge to coumadin post cath 03/09/16. Also on Plavix and ASA 81 mg.  Hematoma noted hardened, not enlarging and without active bleeding.  Heparin level up to 0.52 on 1450 units/hr. RN reports infusing as ordered, though change in rate not documented at ~5am.  INR 1.39 after Coumadin 7.5 mg x 1 yesterday.  Goal of Therapy:  Heparin level 0.3-0.5 units/ml  INR 2-3 Monitor platelets by anticoagulation protocol: Yes   Plan:  Decrease heparin drip to 1400 units/hr. Coumadin 5 mg x 1 today. Heparin level ~6 hrs after rate change. Daily heparin level, PT/INR and CBC. Follow for any bleeding. Coumadin education prior to discharge.  Nicolette Bang, RPh Pager: [phone] 03/10/2016 12:44 PM

## 2016-03-10 NOTE — Progress Notes (Signed)
Report called to lexie rn on 2w

## 2016-03-10 NOTE — Progress Notes (Signed)
PT is at bedside with patient at this time assisting patient to chair. Right leg dressing reinforced with loose Kerlix dressing because of serosangeous drainage noted. Dressing was changed today by PA. Tolerated well. Family remains at bedside. No S/S of distress noted or complaints voiced at this time. Call bell is in reach. Right leg elevated on pillow.

## 2016-03-11 LAB — CBC
HCT: 25 % — ABNORMAL LOW (ref 39.0–52.0)
Hemoglobin: 8.3 g/dL — ABNORMAL LOW (ref 13.0–17.0)
MCH: 30 pg (ref 26.0–34.0)
MCHC: 33.2 g/dL (ref 30.0–36.0)
MCV: 90.3 fL (ref 78.0–100.0)
PLATELETS: 444 10*3/uL — AB (ref 150–400)
RBC: 2.77 MIL/uL — AB (ref 4.22–5.81)
RDW: 15.3 % (ref 11.5–15.5)
WBC: 5.8 10*3/uL (ref 4.0–10.5)

## 2016-03-11 LAB — PROTIME-INR
INR: 1.54 — AB (ref 0.00–1.49)
Prothrombin Time: 18.6 seconds — ABNORMAL HIGH (ref 11.6–15.2)

## 2016-03-11 LAB — CULTURE, BLOOD (ROUTINE X 2)
CULTURE: NO GROWTH
Culture: NO GROWTH

## 2016-03-11 LAB — HEPARIN LEVEL (UNFRACTIONATED): HEPARIN UNFRACTIONATED: 0.61 [IU]/mL (ref 0.30–0.70)

## 2016-03-11 LAB — GLUCOSE, CAPILLARY
GLUCOSE-CAPILLARY: 92 mg/dL (ref 65–99)
GLUCOSE-CAPILLARY: 154 mg/dL — AB (ref 65–99)
Glucose-Capillary: 136 mg/dL — ABNORMAL HIGH (ref 65–99)
Glucose-Capillary: 161 mg/dL — ABNORMAL HIGH (ref 65–99)

## 2016-03-11 MED ORDER — WARFARIN SODIUM 2.5 MG PO TABS
2.5000 mg | ORAL_TABLET | Freq: Once | ORAL | Status: AC
  Administered 2016-03-11: 2.5 mg via ORAL
  Filled 2016-03-11: qty 1

## 2016-03-11 MED ORDER — SILVER NITRATE-POT NITRATE 75-25 % EX MISC
1.0000 | CUTANEOUS | Status: DC | PRN
  Filled 2016-03-11: qty 1

## 2016-03-11 NOTE — Progress Notes (Signed)
ANTICOAGULATION CONSULT NOTE  Pharmacy Consult for Heparin bridge and warfarin initiation Indication:  LV thrombus and R femoral embolism s/p embolectomy and now s/p cath with stent placement  No Known Allergies  Patient Measurements: Height: 5\' 8"  (172.7 cm) Weight: 207 lb 3.7 oz (94 kg) IBW/kg (Calculated) : 68.4  Heparin dosing wt: 87 kg  Labs:  Recent Labs  03/09/16 0412 03/09/16 0857 03/10/16 0340 03/10/16 1116 03/10/16 1901 03/11/16 0657  HGB 8.9*  --  9.3*  --   --  8.3*  HCT 27.4*  --  28.8*  --   --  25.0*  PLT 447*  --  478*  --   --  444*  LABPROT  --  17.6* 17.2*  --   --  18.6*  INR  --  1.44 1.39  --   --  1.54*  HEPARINUNFRC 0.47  --  0.19* 0.52 0.57 0.61  CREATININE  --   --  0.85  --   --   --    Estimated Creatinine Clearance: 107.9 mL/min (by C-G formula based on Cr of 0.85).  Assessment: 55 yo male with LV thrombus s/p R femoral embolectomy and subsequent hematoma evacuation, for heparin.  His heparin was turned off due to complications of bleeding but resumed on 7/8. Currently on heparin bridge to coumadin post cath 03/09/16 (DES placed). Also on Plavix and ASA 81 mg.  Heparin decreased last night due to R leg bleeding -HL=0.61, INR= 1.54 (up), Hg= 8.3   Goal of Therapy:  Heparin level 0.3-0.5 units/ml  INR 2-3 Monitor platelets by anticoagulation protocol: Yes   Plan:  Coumadin 2.5mg  po today  Reduce heparin to 1200 units/hr Daily HL/CBC/INR  Harland German, Pharm D 03/11/2016 8:22 AM

## 2016-03-11 NOTE — Clinical Social Work Note (Signed)
Clinical Social Work Assessment  Patient Details  Name: Troy Yu MRN: [id number] Date of Birth: March 14, 1961  Date of referral:  03/11/16               Reason for consult:  Discharge Planning                Permission sought to share information with:  Chartered certified accountant granted to share information::  No  Name::        Agency::     Relationship::     Contact Information:     Housing/Transportation Living arrangements for the past 2 months:  Single Family Home Source of Information:  Patient Patient Interpreter Needed:  None Criminal Activity/Legal Involvement Pertinent to Current Situation/Hospitalization:  No - Comment as needed Significant Relationships:  Adult Children Lives with:  Adult Children Do you feel safe going back to the place where you live?  Yes Need for family participation in patient care:  No (Coment)  Care giving concerns:  Patient is refusing to go to SNF at this time. He reported he can not make a decision about SNF until he feels that he is ready.    Social Worker assessment / plan:  CSW met with patient at beside to complete assessment. Patient was resting comfortably in bed. Patient's son was also at bedside. CSW explained PT recommendation for SNF placement. CSW explained SNF search and placement process to the patient and patient's son and answered there questions. Patient refused to allow social worker to send referrals.   Employment status:  Disabled (Comment on whether or not currently receiving Disability) (Unknown if patient is receiving disability. ) Insurance information:  Self Pay (Medicaid Pending) PT Recommendations:  [place] / Referral to community resources:  [place]  Patient/Family's Response to care:  The patient appears happy with the care he is receiving in hospital and is appreciative of CSW assistance.  Patient/Family's Understanding of and Emotional Response  to Diagnosis, Current Treatment, and Prognosis:  The patient has a good understanding of why he was admitted. He understands the care plan and what he  will need post discharge. Emotional Assessment Appearance:  Appears stated age Attitude/Demeanor/Rapport:    Affect (typically observed):  Accepting, Appropriate, Calm Orientation:  Oriented to Self, Oriented to Place, Oriented to  Time, Oriented to Situation Alcohol / Substance use:  Not Applicable Psych involvement (Current and /or in the community):  No (Comment)  Discharge Needs  Concerns to be addressed:  Discharge Planning Concerns Readmission within the last 30 days:  No Current discharge risk:  Physical Impairment Barriers to Discharge:  Continued Medical Work up   [employer], LCSW 03/11/2016, 4:59 PM

## 2016-03-11 NOTE — Progress Notes (Signed)
Pt slept throughout night. Offered dressing changes 2 more times over the night and again this morning. Pt continued to refuse. Pt allowed me to add gauze wrap to his dressing again after bleeding through previous reinforcement gauze and new bedding. Vital signs stable. Will continue to monitor.

## 2016-03-11 NOTE — Progress Notes (Addendum)
Pt seen and examined with Dr. Arbie Cookey this morning.  Bloody dressing taken down.  One small area close to the proximal/anterior skin edge that was bleeding.  Manual pressure and silver nitrate stick applied with resolution of bloody ooze.    Hydrogel and wet to dry dressing with kerlix placed.  Will see pt again on Monday.  Please call over the weekend if there are any wound issues.   Continue daily dressing changes.   Doreatha Massed, [facility] 03/11/2016 1:01 PM  I have examined the patient, reviewed and agree with above. Patient did have a extensive amount of blood on his dressings. Would not allow it to be tracked change last night. This was reinforced several times. There was no bleeding from the lateral aspect. His lateral fasciotomy closure site does have some area of full-thickness loss throughout the length of the fasciotomy closure. On the medial aspect the patient does have good granulating tissue was some fibrinous exudate present. There was one small area of skin edge using which had been responsible for all of this bleeding. This was initially controlled with slight pressure over this and then a silver nitrate stick in 6 in addition. Explained the patient that my opinion he continues to be the plastic surgery would be able to allow closure of this much more rapidly. He has a very large wound and would eventually heal with dressing changes and times but this could take months. Patient wants to consider this over the weekend. We'll see him again Monday.  Gretta Began, MD 03/11/2016 2:37 PM

## 2016-03-11 NOTE — Progress Notes (Signed)
PROGRESS NOTE    Troy Yu  KKX:[id number] DOB: 23-Feb-1961 DOA: 02/21/2016 PCP: No primary care provider on file.   Brief Narrative:  Troy Yu is an 55 y.o. male who was in his usual state of health until about 2 weeks ago when he had substernal chest pain that lasted about 48 hours.The pain went away after he vomited, and he attributed his symptoms to a prolonged religious fast. He then come to the [facility] ED on 02/21/16 with severe RLE pain associated with paresthesias. Upon presentation, he was found to have an abnormal EKG and elevated troponin. Cardiology was subsequently called to evaluate the patient, and he was felt to have suffered a subacute anterior MI. STAT echo was ordered and EDP advised to order an arterial duplex with recommendation to consult vascular surgery. IV heparin ordered and the patient was transferred to [facility] for further evaluation by vascular surgery, as it was felt that he had a right femoral embolism with concern for threatened limb. Dr Imogene Burn subsequently performed a right femoral embolectomy and right calf compartment fasciotomies x 4 . At 4:16 a.m. On 02/22/16, the patient complained of loss of sensation to RLE, and the patient reports he was in severe pain. He was given pain medication which did not alleviate his symptoms. He was re-evaluated by Dr. Imogene Burn at 7:33 a.m. Who removed the ACE wrap from his RLE which relieved the pain, and the patient was felt to have excessive bleeding in his calf related to full anti-coagulation. He was taken back to surgery for evacuation of hematoma and VAC dressings. Cardiologist continues to follow for management of his subacute MI. Patient underwent closure of lateral fasciotomy. Plastic surgery consulted and recommendations given. 7/6 patient's hemoglobin dropped to 5 from continuous oozing from the leg wound, and his heparin was stopped on 7/5. Dr Early recommended surgical evaluation by plastic surgery, but patient does not want  any further surgery at this time and wanted to address the bleeding from the leg wound with local wound care. Explained to him that he needs IV heparin for his LV thrombus and he is at risk from cardiac complications without IV heparin. And at the same time he continues to bleed on IV heparin, he needs the Leg wound to be evaluated by the plastic surgery for possible skin graft surgery.  7/7 pt 's hemoglobin improved to 10 , and restarted IV heparin on 7/8, as oozing from the leg has improved and hemoglobin stable at 9.7 7/9 abdominal distention, and abd film shows gas filled small bowel loops, recommended to get OOB and ambulate.  Fevers on and off over the 24 to 36 hrs, blood culture, CXR, UA, and CT abdomen ordered .  7/10 work up revealed CT abd showing cholelithiasis with fingings concerning for early acute cholecystitis but currently he is asymptomatic. US abdomen ordered.   7/11 US abdomen shows a gall stone in the gall bladder neck but he remains asymptomatic at this time . Discussed with Dr Duke Salvia And schedule for cardiac catheterization tomorrow if patient agrees.   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  -Case taken over by Dr. Arbie Cookey after patient requested a second opinion.  -s/p closure of lateral fasciotomy site and placement of  VAC on medial fasciotomy site -Plastic surgery consulted by vascular for possible STSG to medial fasciotomy site, but patient refusing any further surgical procedures.  -Wound vac removed (medial) and Daily hydrogel application and wet-to-dry dressings - 7/4 leg wound continues to ooze blood and dressing changed several times and  Dr Early contacted and IV heparin was discontinued.  - 7/6 his hemoglobin dropped to 5 and he received 3 units of prbc transfusion. Repeat hemoglobin improved .  - 7/8 heparin restarted as his hemoglobin is stable and oozing from the wound has decreased.  -7/10 no changes in management.  -Coumadin started 7/12. Discontinue heparin when INR is 2-3. (INR 1.54) -Continue dressing changes per vascular recommendations  Symptomatic anemia/ Postoperative anemia/ Anemia secondary to blood loss -During hospitalization, hemoglobin has dropped to as low as 4.7 , continues to drop , requiring many prbc transfusions.  -Has received a total of 10 u PRBCs this admission -Hemoglobin 5 on 7/6 and he received 3 units of prbc transfusion  -anemia from blood loss from continuous oozing from the leg wound. IV heparin was held on 7/5. -restarted IV heparin on 7/8, as oozing from leg wound improved. -transfuse to keep hemoglobin greater than 8.  -hemoglobin today 8.3   Syncope and orthostasis -Likely secondary to blood loss -Continue to monitor closely  Anterior subendocardial MI (HCC)/Abnormal EKG/Elevated troponin -On therapeutic dose heparin, which was held on 7/5 for continuous oozing from the leg wound. Restarted IV heparin on 7/8 as his oozing from the leg wound decreased and his hemoglobin is stable.  -Cardiology consulted and appreciated -ACE-I to be started when BP able to tolerate -s/p DES to distal RCA and mid LAD -Started on plavix, aspririn, coumadin (discontinue aspirin after 1 month)  Newly diagnosed diabetes mellitus/Hyperglycemia -Hemoglobin A1c 9.2.  -Continue lantus, ISS and CBG monitoring.  -Diabetes coordinator consulted  Hyponatremia -Serum osm 273 , urine osmo, TSH and am cortisol normal limits. .  -Continue to monitor BMP intermittently.  Abdominal distention, and constipation: -patientreported abdominal pain and feeling bloated. AXR shows air filled small bowels,  probably from small bowel dysmotility. Recommended he get out of bed and ambulate which will help relieve the gas, and help with constipation. -Improved with enema  Fevers  -on and OFF associated with chills, none today.  -Initially thought to be from blood transfusions -Septic work up including: Blood cultures ordered, CXR, UA, and CT abd and pelvis without contrast ordered for further evaluation. CT abd shows cholelithiasis with fingings concerning for early acute cholecystitis.  -US abdomen ordered, which showed mildly dilated gall bladder, with a 9 mm gall bladder stone impacted in the necko f the gall bladder, no pericholecystic fluid no wall thickening. Negative murphy's sign. He is currently asymptomatic. Monitor. No further intervention needed at this time.   DVT Prophylaxis  Heparin   Code Status: Full  Family Communication:  Son at bedside  Disposition Plan: Admitted. Pending therapeutic INR and wound care.  Consultants Cardiology  Vascular Surgery Plastic surgery  Procedures  Right femoral embolectomy Right calf four compartment fasciotomies  Evacuation of hematoma from bilateral right calf fasciotomies Placement of negative pressure dressings x2 Closure of lateral fasciotomy site and placement of VAC on medial fasciotomy site Cardiac catheterization, DES to distal RCA and Mid LAD  Antibiotics   Anti-infectives    Start     Dose/Rate Route Frequency Ordered Stop   02/26/16 0930  [MAR Hold]  cefUROXime (ZINACEF) 1.5 g in dextrose 5 % 50 mL IVPB     (  MAR Hold since 02/26/16 0928)   1.5 g 100 mL/hr over 30 Minutes Intravenous To ShortStay Surgical 02/25/16 1055 02/26/16 1120   02/22/16 0800  cefUROXime (ZINACEF) 1.5 g in dextrose 5 % 50 mL IVPB     1.5 g 100 mL/hr over 30 Minutes Intravenous Every 12 hours 02/21/16 2115 02/22/16 2140   02/22/16 0600  cefUROXime (ZINACEF) injection 1.5 g     1.5 g Intramuscular On call to O.R. 02/21/16 1523 02/21/16 1713       Subjective:   Troy Yu seen and examined today.  Denies chest pain, shortness of breath, abdominal pain, nausea, vomiting, constipation, diarrhea, headache, dizziness.  Complains of leg pain and problems with wound care and bleeding.    Objective:   Filed Vitals:   03/10/16 2045 03/11/16 0614 03/11/16 0858 03/11/16 1023  BP: 103/65 108/73 106/66   Pulse: 79 88 95   Temp: 98.9 F (37.2 C) 99.1 F (37.3 C)  98.8 F (37.1 C)  TempSrc: Oral Oral Oral Oral  Resp: Height:      Weight:      SpO2: 99% 100% 96%     Intake/Output Summary (Last 24 hours) at 03/11/16 1352 Last data filed at 03/11/16 1035  Gross per 24 hour  Intake    240 ml  Output   6350 ml  Net  -6110 ml   Filed Weights   03/08/16 0500 03/09/16 0457 03/10/16 0640  Weight: 95 kg (209 lb 7 oz) 94.892 kg (209 lb 3.2 oz) 94 kg (207 lb 3.7 oz)    Exam  General: Well developed, well nourished, NAD  HEENT: NCAT, mucous membranes moist.   Cardiovascular: S1 S2 auscultated, RRR, no murmurs  Respiratory: Clear to auscultation bilaterally with equal chest rise  Abdomen: Soft, nontender, nondistended, + bowel sounds  Extremities: warm dry without cyanosis clubbing or edema. RLE wrapped  Neuro: AAOx3, nonfocal  Psych: Normal affect and demeanor, pleasant   Data Reviewed: I have personally reviewed following labs and imaging studies  CBC:  Recent Labs Lab 03/07/16 0808 03/08/16 0317 03/09/16 0412 03/10/16 0340 03/11/16 0657  WBC 11.4* 8.5 6.7 6.8 5.8  HGB 8.4* 8.7* 8.9* 9.3* 8.3*  HCT 25.7* 26.6* 27.4* 28.8* 25.0*  MCV 90.8 92.0 90.7 90.6 90.3  PLT 425* 431* 447* 478* 444*   Basic Metabolic Panel:  Recent Labs Lab 03/05/16 2345 03/06/16 0410 03/07/16 0808 03/08/16 0317 03/10/16 0340  NA 132* 130* 136 135 133*  K 4.9 3.7 3.5 3.5 3.5  CL 103 101 105 107 103  CO2 GLUCOSE 198* 197* 96 104* 88  BUN 11 11 5* 5* <5*  CREATININE 0.88 0.85 0.83 0.83 0.85   CALCIUM 7.3* 7.3* 7.6* 7.3* 7.6*   GFR: Estimated Creatinine Clearance: 107.9 mL/min (by C-G formula based on Cr of 0.85). Liver Function Tests:  Recent Labs Lab 03/05/16 2345  AST 66*  ALT 16*  ALKPHOS 61  BILITOT 1.9*  PROT 5.2*  ALBUMIN 1.6*    Recent Labs Lab 03/05/16 2345  LIPASE 58*   No results for input(s): AMMONIA in the last 168 hours. Coagulation Profile:  Recent Labs Lab 03/09/16 0857 03/10/16 0340 03/11/16 0657  INR 1.44 1.39 1.54*   Cardiac Enzymes: No results for input(s): CKTOTAL, CKMB, CKMBINDEX, TROPONINI in the last 168 hours. BNP (last 3 results) No results for input(s): PROBNP in the last 8760 hours. HbA1C: No results for input(s): HGBA1C in the last  72 hours. CBG:  Recent Labs Lab 03/10/16 1128 03/10/16 1511 03/10/16 2051 03/11/16 0610 03/11/16 1217  GLUCAP 90 126* 127* 92 136*   Lipid Profile: No results for input(s): CHOL, HDL, LDLCALC, TRIG, CHOLHDL, LDLDIRECT in the last 72 hours. Thyroid Function Tests: No results for input(s): TSH, T4TOTAL, FREET4, T3FREE, THYROIDAB in the last 72 hours. Anemia Panel: No results for input(s): VITAMINB12, FOLATE, FERRITIN, TIBC, IRON, RETICCTPCT in the last 72 hours. Urine analysis:    Component Value Date/Time   COLORURINE YELLOW 03/06/2016 1500   APPEARANCEUR CLEAR 03/06/2016 1500   LABSPEC 1.008 03/06/2016 1500   PHURINE 6.5 03/06/2016 1500   GLUCOSEU NEGATIVE 03/06/2016 1500   HGBUR NEGATIVE 03/06/2016 1500   BILIRUBINUR NEGATIVE 03/06/2016 1500   KETONESUR NEGATIVE 03/06/2016 1500   PROTEINUR NEGATIVE 03/06/2016 1500   NITRITE NEGATIVE 03/06/2016 1500   LEUKOCYTESUR NEGATIVE 03/06/2016 1500   Sepsis Labs: @LABRCNTIP (procalcitonin:4,lacticidven:4)  ) Recent Results (from the past 240 hour(s))  Culture, blood (Routine X 2) w Reflex to ID Panel     Status: None (Preliminary result)   Collection Time: 03/06/16 10:40 AM  Result Value Ref Range Status   Specimen Description  BLOOD BLOOD LEFT ARM  Final   Special Requests IN PEDIATRIC BOTTLE 1CC  Final   Culture NO GROWTH 4 DAYS  Final   Report Status PENDING  Incomplete  Culture, blood (Routine X 2) w Reflex to ID Panel     Status: None (Preliminary result)   Collection Time: 03/06/16 10:45 AM  Result Value Ref Range Status   Specimen Description BLOOD BLOOD LEFT ARM  Final   Special Requests IN PEDIATRIC BOTTLE 1CC  Final   Culture NO GROWTH 4 DAYS  Final   Report Status PENDING  Incomplete      Radiology Studies: No results found.   Scheduled Meds: . aspirin EC  81 mg Oral Daily  . atorvastatin  80 mg Oral q1800  . carvedilol  6.25 mg Oral BID WC  . clopidogrel  75 mg Oral Q breakfast  . docusate sodium  100 mg Oral Daily  . insulin aspart  0-15 Units Subcutaneous TID WC  . insulin aspart  0-5 Units Subcutaneous QHS  . insulin aspart  6 Units Subcutaneous TID WC  . insulin glargine  20 Units Subcutaneous QHS  . pantoprazole  40 mg Oral BID AC  . sodium chloride flush  3 mL Intravenous Q12H  . warfarin  2.5 mg Oral ONCE-1800  . warfarin   Does not apply Once  . Warfarin - Pharmacist Dosing Inpatient   Does not apply q1800   Continuous Infusions: . heparin 1,200 Units/hr (03/11/16 0902)  . nitroGLYCERIN Stopped (03/08/16 1200)     LOS: 19 days   Time Spent in minutes   30 minutes  Troy Yu D.O. on 03/11/2016 at 1:52 PM  Between 7am to 7pm - Pager - [phone]  After 7pm go to [email or web address] - password [email or web address]  And look for the night coverage person covering for me after hours  [facility] Office  [phone]

## 2016-03-11 NOTE — Progress Notes (Addendum)
CARDIAC REHAB PHASE I   Pt with leg wound currently OTA, pt only able to walk 4 ft with PT yesterday, will await PT progress for ambulation with cardiac rehab.  Continued MI/stent education with pt and son at bedside.  Reviewed risk factors, anti-platelet therapy, stent card, activity restrictions, ntg, s/s heart failure, heart healthy diet, carb counting, portion control, sodium restrictions and phase 2 cardiac rehab. Pt verbalized understanding, needs reinforcement, however, pt more receptive to education today than yesterday. Pt needs reinforcement of education, particularly diet education as able. Pt agrees to phase 2 cardiac rehab referral, will send to [facility] per pt request. Pt does not have insurance, will need assistance with medication, case manager aware and following. Pt may also benefit from diabetes coordinator/dietician consults prior to discharge.  Pt in bed, call bell within reach, will follow.  [phone] Joylene Grapes, RN, BSN 03/11/2016 11:45 AM

## 2016-03-12 LAB — BASIC METABOLIC PANEL
ANION GAP: 9 (ref 5–15)
BUN: 5 mg/dL — ABNORMAL LOW (ref 6–20)
CALCIUM: 8.1 mg/dL — AB (ref 8.9–10.3)
CO2: 24 mmol/L (ref 22–32)
Chloride: 105 mmol/L (ref 101–111)
Creatinine, Ser: 0.79 mg/dL (ref 0.61–1.24)
Glucose, Bld: 88 mg/dL (ref 65–99)
POTASSIUM: 3.6 mmol/L (ref 3.5–5.1)
Sodium: 138 mmol/L (ref 135–145)

## 2016-03-12 LAB — GLUCOSE, CAPILLARY
GLUCOSE-CAPILLARY: 95 mg/dL (ref 65–99)
GLUCOSE-CAPILLARY: 134 mg/dL — AB (ref 65–99)
GLUCOSE-CAPILLARY: 114 mg/dL — AB (ref 65–99)
Glucose-Capillary: 127 mg/dL — ABNORMAL HIGH (ref 65–99)

## 2016-03-12 LAB — PROTIME-INR
INR: 1.6 — AB (ref 0.00–1.49)
PROTHROMBIN TIME: 19.1 s — AB (ref 11.6–15.2)

## 2016-03-12 LAB — CBC
HCT: 25.3 % — ABNORMAL LOW (ref 39.0–52.0)
HEMOGLOBIN: 8.2 g/dL — AB (ref 13.0–17.0)
MCH: 30 pg (ref 26.0–34.0)
MCHC: 32.4 g/dL (ref 30.0–36.0)
MCV: 92.7 fL (ref 78.0–100.0)
PLATELETS: 454 10*3/uL — AB (ref 150–400)
RBC: 2.73 MIL/uL — AB (ref 4.22–5.81)
RDW: 15.7 % — ABNORMAL HIGH (ref 11.5–15.5)
WBC: 6.6 10*3/uL (ref 4.0–10.5)

## 2016-03-12 LAB — HEPARIN LEVEL (UNFRACTIONATED)
HEPARIN UNFRACTIONATED: 0.78 [IU]/mL — AB (ref 0.30–0.70)
Heparin Unfractionated: 0.58 IU/mL (ref 0.30–0.70)

## 2016-03-12 MED ORDER — WARFARIN SODIUM 5 MG PO TABS
5.0000 mg | ORAL_TABLET | Freq: Once | ORAL | Status: AC
  Administered 2016-03-12: 5 mg via ORAL
  Filled 2016-03-12: qty 1

## 2016-03-12 NOTE — Progress Notes (Signed)
PROGRESS NOTE    Troy Yu  KKX:[id number] DOB: 23-Feb-1961 DOA: 02/21/2016 PCP: No primary care provider on file.   Brief Narrative:  Troy Yu is an 55 y.o. male who was in his usual state of health until about 2 weeks ago when he had substernal chest pain that lasted about 48 hours.The pain went away after he vomited, and he attributed his symptoms to a prolonged religious fast. He then come to the [facility] ED on 02/21/16 with severe RLE pain associated with paresthesias. Upon presentation, he was found to have an abnormal EKG and elevated troponin. Cardiology was subsequently called to evaluate the patient, and he was felt to have suffered a subacute anterior MI. STAT echo was ordered and EDP advised to order an arterial duplex with recommendation to consult vascular surgery. IV heparin ordered and the patient was transferred to [facility] for further evaluation by vascular surgery, as it was felt that he had a right femoral embolism with concern for threatened limb. Dr Imogene Burn subsequently performed a right femoral embolectomy and right calf compartment fasciotomies x 4 . At 4:16 a.m. On 02/22/16, the patient complained of loss of sensation to RLE, and the patient reports he was in severe pain. He was given pain medication which did not alleviate his symptoms. He was re-evaluated by Dr. Imogene Burn at 7:33 a.m. Who removed the ACE wrap from his RLE which relieved the pain, and the patient was felt to have excessive bleeding in his calf related to full anti-coagulation. He was taken back to surgery for evacuation of hematoma and VAC dressings. Cardiologist continues to follow for management of his subacute MI. Patient underwent closure of lateral fasciotomy. Plastic surgery consulted and recommendations given. 7/6 patient's hemoglobin dropped to 5 from continuous oozing from the leg wound, and his heparin was stopped on 7/5. Dr Early recommended surgical evaluation by plastic surgery, but patient does not want  any further surgery at this time and wanted to address the bleeding from the leg wound with local wound care. Explained to him that he needs IV heparin for his LV thrombus and he is at risk from cardiac complications without IV heparin. And at the same time he continues to bleed on IV heparin, he needs the Leg wound to be evaluated by the plastic surgery for possible skin graft surgery.  7/7 pt 's hemoglobin improved to 10 , and restarted IV heparin on 7/8, as oozing from the leg has improved and hemoglobin stable at 9.7 7/9 abdominal distention, and abd film shows gas filled small bowel loops, recommended to get OOB and ambulate.  Fevers on and off over the 24 to 36 hrs, blood culture, CXR, UA, and CT abdomen ordered .  7/10 work up revealed CT abd showing cholelithiasis with fingings concerning for early acute cholecystitis but currently he is asymptomatic. US abdomen ordered.   7/11 US abdomen shows a gall stone in the gall bladder neck but he remains asymptomatic at this time . Discussed with Dr Duke Salvia And schedule for cardiac catheterization tomorrow if patient agrees.   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  -Case taken over by Dr. Arbie Cookey after patient requested a second opinion.  -s/p closure of lateral fasciotomy site and placement of  VAC on medial fasciotomy site -Plastic surgery consulted by vascular for possible STSG to medial fasciotomy site, but patient refusing any further surgical procedures.  -Wound vac removed (medial) and Daily hydrogel application and wet-to-dry dressings - 7/4 leg wound continues to ooze blood and dressing changed several times and  Dr Early contacted and IV heparin was discontinued.  - 7/6 his hemoglobin dropped to 5 and he received 3 units of prbc transfusion. Repeat hemoglobin improved .  - 7/8 heparin restarted as his hemoglobin is stable and oozing from the wound has decreased.  -7/10 no changes in management.  -Coumadin started 7/12. Discontinue heparin when INR is 2-3. (INR 1.6) -Continue dressing changes per vascular recommendations  Symptomatic anemia/ Postoperative anemia/ Anemia secondary to blood loss -During hospitalization, hemoglobin has dropped to as low as 4.7 , continues to drop , requiring many prbc transfusions.  -Has received a total of 10 u PRBCs this admission -Hemoglobin 5 on 7/6 and he received 3 units of prbc transfusion  -anemia from blood loss from continuous oozing from the leg wound. IV heparin was held on 7/5. -restarted IV heparin on 7/8, as oozing from leg wound improved. -transfuse to keep hemoglobin greater than 8.  -hemoglobin today 8.2   Syncope and orthostasis -Likely secondary to blood loss -Continue to monitor closely  Anterior subendocardial MI (HCC)/Abnormal EKG/Elevated troponin -On therapeutic dose heparin, which was held on 7/5 for continuous oozing from the leg wound. Restarted IV heparin on 7/8 as his oozing from the leg wound decreased and his hemoglobin is stable.  -Cardiology consulted and appreciated -ACE-I to be started when BP able to tolerate -s/p DES to distal RCA and mid LAD -Started on plavix, aspririn, coumadin (discontinue aspirin after 1 month)  Newly diagnosed diabetes mellitus/Hyperglycemia -Hemoglobin A1c 9.2.  -Continue lantus, ISS and CBG monitoring.  -Diabetes coordinator consulted  Hyponatremia -Serum osm 273 , urine osmo, TSH and am cortisol normal limits. .  -Continue to monitor BMP intermittently.  Abdominal distention, and constipation: -patientreported abdominal pain and feeling bloated. AXR shows air filled small bowels,  probably from small bowel dysmotility. Recommended he get out of bed and ambulate which will help relieve the gas, and help with constipation. -Improved with enema  Fevers  -on and OFF associated with chills, none today.  -Initially thought to be from blood transfusions -Septic work up including: Blood cultures ordered, CXR, UA, and CT abd and pelvis without contrast ordered for further evaluation. CT abd shows cholelithiasis with fingings concerning for early acute cholecystitis.  -US abdomen ordered, which showed mildly dilated gall bladder, with a 9 mm gall bladder stone impacted in the necko f the gall bladder, no pericholecystic fluid no wall thickening. Negative murphy's sign. He is currently asymptomatic. Monitor. No further intervention needed at this time.   DVT Prophylaxis  Heparin   Code Status: Full  Family Communication:  Son at bedside  Disposition Plan: Admitted. Pending therapeutic INR and wound care.  Consultants Cardiology  Vascular Surgery Plastic surgery  Procedures  Right femoral embolectomy Right calf four compartment fasciotomies  Evacuation of hematoma from bilateral right calf fasciotomies Placement of negative pressure dressings x2 Closure of lateral fasciotomy site and placement of VAC on medial fasciotomy site Cardiac catheterization, DES to distal RCA and Mid LAD  Antibiotics   Anti-infectives    Start     Dose/Rate Route Frequency Ordered Stop   02/26/16 0930  [MAR Hold]  cefUROXime (ZINACEF) 1.5 g in dextrose 5 % 50 mL IVPB     (  MAR Hold since 02/26/16 0928)   1.5 g 100 mL/hr over 30 Minutes Intravenous To ShortStay Surgical 02/25/16 1055 02/26/16 1120   02/22/16 0800  cefUROXime (ZINACEF) 1.5 g in dextrose 5 % 50 mL IVPB     1.5 g 100 mL/hr over 30 Minutes Intravenous Every 12 hours 02/21/16 2115 02/22/16 2140   02/22/16 0600  cefUROXime (ZINACEF) injection 1.5 g     1.5 g Intramuscular On call to O.R. 02/21/16 1523 02/21/16 1713       Subjective:   Troy Yu seen and examined today.  Patient agitated and upset that his leg is still bleeding.  Denies chest pain, shortness of breath, abdominal pain, nausea, vomiting, constipation, diarrhea, headache, dizziness.    Objective:   Filed Vitals:   03/11/16 1652 03/11/16 2037 03/12/16 0622 03/12/16 0634  BP: 105/61 100/59 100/61   Pulse: 79 81 80   Temp: 97.8 F (36.6 C) 98.3 F (36.8 C) 98.4 F (36.9 C)   TempSrc: Oral Oral Oral   Resp: 18 18 16    Height:      Weight:    91.309 kg (201 lb 4.8 oz)  SpO2: 98% 98% 96%     Intake/Output Summary (Last 24 hours) at 03/12/16 1214 Last data filed at 03/12/16 0905  Gross per 24 hour  Intake    240 ml  Output   4075 ml  Net  -3835 ml   Filed Weights   03/09/16 0457 03/10/16 0640 03/12/16 0634  Weight: 94.892 kg (209 lb 3.2 oz) 94 kg (207 lb 3.7 oz) 91.309 kg (201 lb 4.8 oz)    Exam  General: Well developed, well nourished, NAD  HEENT: NCAT, mucous membranes moist.   Cardiovascular: S1 S2 auscultated, RRR, no murmurs  Respiratory: Clear to auscultation bilaterally   Abdomen: Soft, nontender, nondistended, + bowel sounds  Extremities: warm dry without cyanosis clubbing or edema. RLE wrapped- bloody   Neuro: AAOx3, nonfocal  Psych: irritated    Data Reviewed: I have personally reviewed following labs and imaging studies  CBC:  Recent Labs Lab 03/08/16 0317 03/09/16 0412 03/10/16 0340 03/11/16 0657 03/12/16 0619  WBC 8.5 6.7 6.8 5.8 6.6  HGB 8.7* 8.9* 9.3* 8.3* 8.2*  HCT 26.6* 27.4* 28.8* 25.0* 25.3*  MCV 92.0 90.7 90.6 90.3 92.7  PLT 431* 447* 478* 444* 454*   Basic Metabolic Panel:  Recent Labs Lab 03/06/16 0410 03/07/16 0808 03/08/16 0317 03/10/16 0340 03/12/16 0619  NA 130* 136 135 133* 138  K 3.7 3.5 3.5 3.5 3.6  CL 101 105 107 103 105  CO2 22 25 22 24 24   GLUCOSE 197* 96 104* 88 88  BUN 11 5* 5* <5* 5*  CREATININE 0.85 0.83 0.83 0.85 0.79  CALCIUM 7.3* 7.6* 7.3*  7.6* 8.1*   GFR: Estimated Creatinine Clearance: 113.2 mL/min (by C-G formula based on Cr of 0.79). Liver Function Tests:  Recent Labs Lab 03/05/16 2345  AST 66*  ALT 16*  ALKPHOS 61  BILITOT 1.9*  PROT 5.2*  ALBUMIN 1.6*    Recent Labs Lab 03/05/16 2345  LIPASE 58*   No results for input(s): AMMONIA in the last 168 hours. Coagulation Profile:  Recent Labs Lab 03/09/16 0857 03/10/16 0340 03/11/16 0657 03/12/16 0619  INR 1.44 1.39 1.54* 1.60*   Cardiac Enzymes: No results for input(s): CKTOTAL, CKMB, CKMBINDEX, TROPONINI in the last 168 hours. BNP (last 3 results) No results for input(s): PROBNP in the last 8760 hours. HbA1C: No results for input(s): HGBA1C  in the last 72 hours. CBG:  Recent Labs Lab 03/11/16 1217 03/11/16 1644 03/11/16 2027 03/12/16 0633 03/12/16 1125  GLUCAP 136* 154* 161* 95 134*   Lipid Profile: No results for input(s): CHOL, HDL, LDLCALC, TRIG, CHOLHDL, LDLDIRECT in the last 72 hours. Thyroid Function Tests: No results for input(s): TSH, T4TOTAL, FREET4, T3FREE, THYROIDAB in the last 72 hours. Anemia Panel: No results for input(s): VITAMINB12, FOLATE, FERRITIN, TIBC, IRON, RETICCTPCT in the last 72 hours. Urine analysis:    Component Value Date/Time   COLORURINE YELLOW 03/06/2016 1500   APPEARANCEUR CLEAR 03/06/2016 1500   LABSPEC 1.008 03/06/2016 1500   PHURINE 6.5 03/06/2016 1500   GLUCOSEU NEGATIVE 03/06/2016 1500   HGBUR NEGATIVE 03/06/2016 1500   BILIRUBINUR NEGATIVE 03/06/2016 1500   KETONESUR NEGATIVE 03/06/2016 1500   PROTEINUR NEGATIVE 03/06/2016 1500   NITRITE NEGATIVE 03/06/2016 1500   LEUKOCYTESUR NEGATIVE 03/06/2016 1500   Sepsis Labs: (procalcitonin:4,lacticidven:4)  ) Recent Results (from the past 240 hour(s))  Culture, blood (Routine X 2) w Reflex to ID Panel     Status: None   Collection Time: 03/06/16 10:40 AM  Result Value Ref Range Status   Specimen Description BLOOD BLOOD LEFT ARM  Final    Special Requests IN PEDIATRIC BOTTLE 1CC  Final   Culture NO GROWTH 5 DAYS  Final   Report Status 03/11/2016 FINAL  Final  Culture, blood (Routine X 2) w Reflex to ID Panel     Status: None   Collection Time: 03/06/16 10:45 AM  Result Value Ref Range Status   Specimen Description BLOOD BLOOD LEFT ARM  Final   Special Requests IN PEDIATRIC BOTTLE 1CC  Final   Culture NO GROWTH 5 DAYS  Final   Report Status 03/11/2016 FINAL  Final      Radiology Studies: No results found.   Scheduled Meds: . aspirin EC  81 mg Oral Daily  . atorvastatin  80 mg Oral q1800  . carvedilol  6.25 mg Oral BID WC  . clopidogrel  75 mg Oral Q breakfast  . docusate sodium  100 mg Oral Daily  . insulin aspart  0-15 Units Subcutaneous TID WC  . insulin aspart  0-5 Units Subcutaneous QHS  . insulin aspart  6 Units Subcutaneous TID WC  . insulin glargine  20 Units Subcutaneous QHS  . pantoprazole  40 mg Oral BID AC  . sodium chloride flush  3 mL Intravenous Q12H  . warfarin  5 mg Oral ONCE-1800  . warfarin   Does not apply Once  . Warfarin - Pharmacist Dosing Inpatient   Does not apply q1800   Continuous Infusions: . heparin 1,200 Units/hr (03/11/16 1728)  . nitroGLYCERIN Stopped (03/08/16 1200)     LOS: 20 days   Time Spent in minutes   30 minutes  Zeidy Tayag D.O. on 03/12/2016 at 12:14 PM  Between 7am to 7pm - Pager - [phone]  After 7pm go to [email or web address] - password [email or web address]  And look for the night coverage person covering for me after hours  [facility] Office  [phone]

## 2016-03-12 NOTE — Progress Notes (Signed)
Offered pt dressing change and/or reinforcement on his dressing. Pt refused.

## 2016-03-12 NOTE — Discharge Instructions (Signed)

## 2016-03-12 NOTE — Progress Notes (Signed)
Wound on pt's right leg has bleed through his dressing. I offered to change it or at least reinforce it but pt refused.

## 2016-03-12 NOTE — Progress Notes (Signed)
Offered bath, Pt stated he would like to wait until dressing is changed.

## 2016-03-12 NOTE — Progress Notes (Signed)
Dressing assessed with night shift RN, dressing and sheets saturated. At this time, pt refused to have dressing changed.  Troy Yu M

## 2016-03-12 NOTE — Progress Notes (Signed)
Dressing changed. Pt was very reluctant about staff changing dressing and was very specific on how he wanted dressing. Pt refused for dressing to be changed per MD's order. Bloody drainage was noted on  Dressing before change was completed. Pt would not allow staff to reinforce the dressing to control the bleeding. Pt was educated on importance of following doctor's orders.   Troy Yu M

## 2016-03-12 NOTE — Progress Notes (Signed)
ANTICOAGULATION CONSULT NOTE  Pharmacy Consult for Heparin bridge and warfarin initiation Indication:  LV thrombus and R femoral embolism s/p embolectomy and now s/p cath with stent placement  No Known Allergies  Patient Measurements: Height: 5\' 8"  (172.7 cm) Weight: 201 lb 4.8 oz (91.309 kg) IBW/kg (Calculated) : 68.4  Heparin dosing wt: 87 kg  Labs:  Recent Labs  03/10/16 0340  03/11/16 0657 03/12/16 0619 03/12/16 1831  HGB 9.3*  --  8.3* 8.2*  --   HCT 28.8*  --  25.0* 25.3*  --   PLT 478*  --  444* 454*  --   LABPROT 17.2*  --  18.6* 19.1*  --   INR 1.39  --  1.54* 1.60*  --   HEPARINUNFRC 0.19*  < > 0.61 0.78* 0.58  CREATININE 0.85  --   --  0.79  --   < > = values in this interval not displayed. Estimated Creatinine Clearance: 113.2 mL/min (by C-G formula based on Cr of 0.79).  Assessment: 55 yo Yu with LV thrombus s/p R femoral embolectomy and subsequent hematoma evacuation, for heparin.  His heparin was turned off due to complications of bleeding but resumed on 7/8. Currently on heparin bridge to coumadin post cath 03/09/16 (DES placed). Also on Plavix and ASA 81 mg. Heparin level slightly above goal at 0.58. No bleeding noted.   Goal of Therapy:  Heparin level 0.3-0.5 units/ml  Monitor platelets by anticoagulation protocol: Yes   Plan:  - Decrease heparin gtt slightly to 1000 units/hr - F/u AM heparin level  Lysle Pearl, PharmD, BCPS Pager # [phone] 03/12/2016 7:28 PM

## 2016-03-12 NOTE — Progress Notes (Signed)
ANTICOAGULATION CONSULT NOTE  Pharmacy Consult for Heparin bridge and warfarin initiation Indication:  LV thrombus and R femoral embolism s/p embolectomy and now s/p cath with stent placement  No Known Allergies  Patient Measurements: Height: 5\' 8"  (172.7 cm) Weight: 201 lb 4.8 oz (91.309 kg) IBW/kg (Calculated) : 68.4  Heparin dosing wt: 87 kg  Labs:  Recent Labs  03/10/16 0340  03/10/16 1901 03/11/16 0657 03/12/16 0619  HGB 9.3*  --   --  8.3* 8.2*  HCT 28.8*  --   --  25.0* 25.3*  PLT 478*  --   --  444* 454*  LABPROT 17.2*  --   --  18.6* 19.1*  INR 1.39  --   --  1.54* 1.60*  HEPARINUNFRC 0.19*  < > 0.57 0.61 0.78*  CREATININE 0.85  --   --   --  0.79  < > = values in this interval not displayed. Estimated Creatinine Clearance: 113.2 mL/min (by C-G formula based on Cr of 0.79).  Assessment: 55 yo male with LV thrombus s/p R femoral embolectomy and subsequent hematoma evacuation, for heparin.  His heparin was turned off due to complications of bleeding but resumed on 7/8. Currently on heparin bridge to coumadin post cath 03/09/16 (DES placed). Also on Plavix and ASA 81 mg.  -HL=0.78, INR= 1.6 (up), Hg= 8.2   Goal of Therapy:  Heparin level 0.3-0.5 units/ml  INR 2-3 Monitor platelets by anticoagulation protocol: Yes   Plan:  Coumadin 5mg  po today  Reduce heparin to 1050 units/hr Recheck a heparin level in 6 hours Daily HL/CBC/INR  Harland German, Pharm D 03/12/2016 12:11 PM

## 2016-03-13 LAB — CBC
HCT: 24.2 % — ABNORMAL LOW (ref 39.0–52.0)
HEMOGLOBIN: 7.8 g/dL — AB (ref 13.0–17.0)
MCH: 29.9 pg (ref 26.0–34.0)
MCHC: 32.2 g/dL (ref 30.0–36.0)
MCV: 92.7 fL (ref 78.0–100.0)
Platelets: 470 10*3/uL — ABNORMAL HIGH (ref 150–400)
RBC: 2.61 MIL/uL — AB (ref 4.22–5.81)
RDW: 15.8 % — ABNORMAL HIGH (ref 11.5–15.5)
WBC: 8.7 10*3/uL (ref 4.0–10.5)

## 2016-03-13 LAB — BASIC METABOLIC PANEL
ANION GAP: 6 (ref 5–15)
BUN: 8 mg/dL (ref 6–20)
CO2: 25 mmol/L (ref 22–32)
Calcium: 8 mg/dL — ABNORMAL LOW (ref 8.9–10.3)
Chloride: 102 mmol/L (ref 101–111)
Creatinine, Ser: 0.75 mg/dL (ref 0.61–1.24)
GFR calc Af Amer: >60 mL/min (ref >60)
Glucose, Bld: 96 mg/dL (ref 65–99)
POTASSIUM: 3.7 mmol/L (ref 3.5–5.1)
SODIUM: 133 mmol/L — AB (ref 135–145)

## 2016-03-13 LAB — PREPARE RBC (CROSSMATCH)

## 2016-03-13 LAB — GLUCOSE, CAPILLARY
GLUCOSE-CAPILLARY: 106 mg/dL — AB (ref 65–99)
GLUCOSE-CAPILLARY: 159 mg/dL — AB (ref 65–99)
GLUCOSE-CAPILLARY: 194 mg/dL — AB (ref 65–99)
GLUCOSE-CAPILLARY: 156 mg/dL — AB (ref 65–99)

## 2016-03-13 LAB — PROTIME-INR
INR: 1.47 (ref 0.00–1.49)
PROTHROMBIN TIME: 17.9 s — AB (ref 11.6–15.2)

## 2016-03-13 LAB — HEPARIN LEVEL (UNFRACTIONATED): HEPARIN UNFRACTIONATED: 0.32 [IU]/mL (ref 0.30–0.70)

## 2016-03-13 MED ORDER — WARFARIN SODIUM 7.5 MG PO TABS
7.5000 mg | ORAL_TABLET | Freq: Once | ORAL | Status: AC
  Administered 2016-03-13: 7.5 mg via ORAL
  Filled 2016-03-13: qty 1

## 2016-03-13 MED ORDER — SODIUM CHLORIDE 0.9 % IV SOLN: Freq: Once | INTRAVENOUS | Status: DC

## 2016-03-13 NOTE — Progress Notes (Signed)
  [facility] Progress Note  Subjective    Asked to see regarding bleeding from right medial fasciotomy site.   Objective Filed Vitals:   03/12/16 1542 03/13/16 0005  BP: 96/60 100/59  Pulse: 85 92  Temp: 98.7 F (37.1 C) 98.5 F (36.9 C)  Resp: 17 18    Intake/Output Summary (Last 24 hours) at 03/13/16 1042 Last data filed at 03/12/16 1332  Gross per 24 hour  Intake    240 ml  Output   1975 ml  Net  -1735 ml   Lateral fasciotomy site sutures intact.  Medial fasciotomy site with pale granulation tissue. Skin edge bleeding at medial edge.  2+ right DP pulse.   Assessment/Planning: 55 y.o. male   Had small area of skin edge bleeding to medial fasciotomy site. Pressure was held to area and silver nitrate applied. Bleeding resolved. As hydrogel was unavailable, normal saline wet to dry dressing was applied. Advised RN to change dressing again later today. Once hydrogel available, begin daily dressing changes again.    Troy Yu 03/13/2016 10:42 AM --  Laboratory CBC    Component Value Date/Time   WBC 8.7 03/13/2016 0602   HGB 7.8* 03/13/2016 0602   HCT 24.2* 03/13/2016 0602   PLT 470* 03/13/2016 0602    BMET    Component Value Date/Time   NA 133* 03/13/2016 0602   K 3.7 03/13/2016 0602   CL 102 03/13/2016 0602   CO2 25 03/13/2016 0602   GLUCOSE 96 03/13/2016 0602   BUN 8 03/13/2016 0602   CREATININE 0.75 03/13/2016 0602   CALCIUM 8.0* 03/13/2016 0602   GFRNONAA >60 03/13/2016 0602   GFRAA >60 03/13/2016 0602    COAG Lab Results  Component Value Date   INR 1.47 03/13/2016   INR 1.60* 03/12/2016   INR 1.54* 03/11/2016   No results found for: PTT  Antibiotics Anti-infectives    Start     Dose/Rate Route Frequency Ordered Stop   02/26/16 0930  [MAR Hold]  cefUROXime (ZINACEF) 1.5 g in dextrose 5 % 50 mL IVPB     (MAR Hold since 02/26/16 0928)   1.5 g 100 mL/hr over 30 Minutes Intravenous To ShortStay Surgical 02/25/16 1055  02/26/16 1120   02/22/16 0800  cefUROXime (ZINACEF) 1.5 g in dextrose 5 % 50 mL IVPB     1.5 g 100 mL/hr over 30 Minutes Intravenous Every 12 hours 02/21/16 2115 02/22/16 2140   02/22/16 0600  cefUROXime (ZINACEF) injection 1.5 g     1.5 g Intramuscular On call to O.R. 02/21/16 1523 02/21/16 1713       Maris Berger, PA-C [facility] Office: [phone] Pager: [phone] 03/13/2016 10:42 AM

## 2016-03-13 NOTE — Progress Notes (Signed)
ANTICOAGULATION CONSULT NOTE  Pharmacy Consult for Heparin bridge and warfarin initiation Indication:  LV thrombus and R femoral embolism s/p embolectomy and now s/p cath with stent placement  No Known Allergies  Patient Measurements: Height: 5\' 8"  (172.7 cm) Weight:  (patient refused) IBW/kg (Calculated) : 68.4  Heparin dosing wt: 87 kg  Labs:  Recent Labs  03/11/16 0657 03/12/16 0619 03/12/16 1831 03/13/16 0602  HGB 8.3* 8.2*  --  7.8*  HCT 25.0* 25.3*  --  24.2*  PLT 444* 454*  --  470*  LABPROT 18.6* 19.1*  --  17.9*  INR 1.54* 1.60*  --  1.47  HEPARINUNFRC 0.61 0.78* 0.58 0.32  CREATININE  --  0.79  --  0.75   Estimated Creatinine Clearance: 113.2 mL/min (by C-G formula based on Cr of 0.75).  Assessment: 55 yo male with LV thrombus s/p R femoral embolectomy and subsequent hematoma evacuation, for heparin.  His heparin was turned off due to complications of bleeding but resumed on 7/8. Currently on heparin bridge to coumadin post cath 03/09/16 (DES placed). Also on Plavix and ASA 81 mg.  -HL=0.32, INR= 1.47, Hg= 7.8   Goal of Therapy:  Heparin level 0.3-0.5 units/ml  INR 2-3 Monitor platelets by anticoagulation protocol: Yes   Plan:  Coumadin 7.5mg  po today  No heparin changes needed Daily HL/CBC/INR  Harland German, Pharm D 03/13/2016 11:20 AM

## 2016-03-13 NOTE — Progress Notes (Signed)
PROGRESS NOTE    Troy Yu  KKX:[id number] DOB: 23-Feb-1961 DOA: 02/21/2016 PCP: No primary care provider on file.   Brief Narrative:  Troy Yu is an 55 y.o. male who was in his usual state of health until about 2 weeks ago when he had substernal chest pain that lasted about 48 hours.The pain went away after he vomited, and he attributed his symptoms to a prolonged religious fast. He then come to the [facility] ED on 02/21/16 with severe RLE pain associated with paresthesias. Upon presentation, he was found to have an abnormal EKG and elevated troponin. Cardiology was subsequently called to evaluate the patient, and he was felt to have suffered a subacute anterior MI. STAT echo was ordered and EDP advised to order an arterial duplex with recommendation to consult vascular surgery. IV heparin ordered and the patient was transferred to [facility] for further evaluation by vascular surgery, as it was felt that he had a right femoral embolism with concern for threatened limb. Dr Imogene Burn subsequently performed a right femoral embolectomy and right calf compartment fasciotomies x 4 . At 4:16 a.m. On 02/22/16, the patient complained of loss of sensation to RLE, and the patient reports he was in severe pain. He was given pain medication which did not alleviate his symptoms. He was re-evaluated by Dr. Imogene Burn at 7:33 a.m. Who removed the ACE wrap from his RLE which relieved the pain, and the patient was felt to have excessive bleeding in his calf related to full anti-coagulation. He was taken back to surgery for evacuation of hematoma and VAC dressings. Cardiologist continues to follow for management of his subacute MI. Patient underwent closure of lateral fasciotomy. Plastic surgery consulted and recommendations given. 7/6 patient's hemoglobin dropped to 5 from continuous oozing from the leg wound, and his heparin was stopped on 7/5. Dr Early recommended surgical evaluation by plastic surgery, but patient does not want  any further surgery at this time and wanted to address the bleeding from the leg wound with local wound care. Explained to him that he needs IV heparin for his LV thrombus and he is at risk from cardiac complications without IV heparin. And at the same time he continues to bleed on IV heparin, he needs the Leg wound to be evaluated by the plastic surgery for possible skin graft surgery.  7/7 pt 's hemoglobin improved to 10 , and restarted IV heparin on 7/8, as oozing from the leg has improved and hemoglobin stable at 9.7 7/9 abdominal distention, and abd film shows gas filled small bowel loops, recommended to get OOB and ambulate.  Fevers on and off over the 24 to 36 hrs, blood culture, CXR, UA, and CT abdomen ordered .  7/10 work up revealed CT abd showing cholelithiasis with fingings concerning for early acute cholecystitis but currently he is asymptomatic. US abdomen ordered.   7/11 US abdomen shows a gall stone in the gall bladder neck but he remains asymptomatic at this time . Discussed with Dr Duke Salvia And schedule for cardiac catheterization tomorrow if patient agrees.   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  -Case taken over by Dr. Arbie Cookey after patient requested a second opinion.  -s/p closure of lateral fasciotomy site and placement of  VAC on medial fasciotomy site -Plastic surgery consulted by vascular for possible STSG to medial fasciotomy site, but patient refusing any further surgical procedures.  -Wound vac removed (medial) and Daily hydrogel application and wet-to-dry dressings - 7/4 leg wound continues to ooze blood and dressing changed several times and  Dr Early contacted and IV heparin was discontinued.  - 7/6 his hemoglobin dropped to 5 and he received 3 units of prbc transfusion. Repeat hemoglobin improved .  - 7/8 heparin restarted as his hemoglobin is stable and oozing from the wound has decreased.  -7/10 no changes in management.  -Coumadin started 7/12. Discontinue heparin when INR is 2-3. (INR 1.47) -Continue dressing changes per vascular recommendations- dressing change today  Symptomatic anemia/ Postoperative anemia/ Anemia secondary to blood loss -During hospitalization, hemoglobin has dropped to as low as 4.7 , continues to drop , requiring many prbc transfusions.  -Has received a total of 10 u PRBCs this admission -Hemoglobin 5 on 7/6 and he received 3 units of prbc transfusion  -anemia from blood loss from continuous oozing from the leg wound. IV heparin was held on 7/5. -restarted IV heparin on 7/8, as oozing from leg wound improved. -transfuse to keep hemoglobin greater than 8.  -hemoglobin today 7.8, ordered 1uPRBC to be transfused today   Syncope and orthostasis -Likely secondary to blood loss -Continue to monitor closely  Anterior subendocardial MI (HCC)/Abnormal EKG/Elevated troponin -On therapeutic dose heparin, which was held on 7/5 for continuous oozing from the leg wound. Restarted IV heparin on 7/8 as his oozing from the leg wound decreased and his hemoglobin is stable.  -Cardiology consulted and appreciated -ACE-I to be started when BP able to tolerate -s/p DES to distal RCA and mid LAD -Started on plavix, aspririn, coumadin (discontinue aspirin after 1 month)  Newly diagnosed diabetes mellitus/Hyperglycemia -Hemoglobin A1c 9.2.  -Continue lantus, ISS and CBG monitoring.  -Diabetes coordinator consulted  Hyponatremia -Serum osm 273 , urine osmo, TSH and am cortisol normal limits. .  -Continue to monitor BMP intermittently.  Abdominal distention, and constipation: -patientreported abdominal pain  and feeling bloated. AXR shows air filled small bowels, probably from small bowel dysmotility. Recommended he get out of bed and ambulate which will help relieve the gas, and help with constipation. -Improved with enema  Fevers  -on and OFF associated with chills, none today.  -Initially thought to be from blood transfusions -Septic work up including: Blood cultures ordered, CXR, UA, and CT abd and pelvis without contrast ordered for further evaluation. CT abd shows cholelithiasis with fingings concerning for early acute cholecystitis.  -US abdomen ordered, which showed mildly dilated gall bladder, with a 9 mm gall bladder stone impacted in the necko f the gall bladder, no pericholecystic fluid no wall thickening. Negative murphy's sign. He is currently asymptomatic. Monitor. No further intervention needed at this time.   DVT Prophylaxis  Heparin   Code Status: Full  Family Communication:  Son at bedside  Disposition Plan: Admitted. Pending therapeutic INR and wound care.  Consultants Cardiology  Vascular Surgery Plastic surgery  Procedures  Right femoral embolectomy Right calf four compartment fasciotomies  Evacuation of hematoma from bilateral right calf fasciotomies Placement of negative pressure dressings x2 Closure of lateral fasciotomy site and placement of VAC on medial fasciotomy site Cardiac catheterization, DES to distal RCA and Mid LAD  Antibiotics   Anti-infectives    Start     Dose/Rate Route Frequency Ordered Stop   02/26/16 0930  [MAR Hold]  cefUROXime (ZINACEF)  1.5 g in dextrose 5 % 50 mL IVPB     (MAR Hold since 02/26/16 0928)   1.5 g 100 mL/hr over 30 Minutes Intravenous To ShortStay Surgical 02/25/16 1055 02/26/16 1120   02/22/16 0800  cefUROXime (ZINACEF) 1.5 g in dextrose 5 % 50 mL IVPB     1.5 g 100 mL/hr over 30 Minutes Intravenous Every 12 hours 02/21/16 2115 02/22/16 2140   02/22/16 0600  cefUROXime (ZINACEF) injection 1.5 g     1.5 g  Intramuscular On call to O.R. 02/21/16 1523 02/21/16 1713      Subjective:   Roma Maxcy seen and examined today.  Patient currently having dressing change. Complains of continued pain and bleeding in the right leg. Denies chest pain, shortness of breath, abdominal pain, nausea, vomiting, constipation, diarrhea, headache, dizziness.    Objective:   Filed Vitals:   03/12/16 0634 03/12/16 1332 03/12/16 1542 03/13/16 0005  BP:  95/57 96/60 100/59  Pulse:  77 85 92  Temp:  98.9 F (37.2 C) 98.7 F (37.1 C) 98.5 F (36.9 C)  TempSrc:  Oral Oral Oral  Resp:  18 17 18   Height:      Weight: 91.309 kg (201 lb 4.8 oz)     SpO2:  100% 100% 98%    Intake/Output Summary (Last 24 hours) at 03/13/16 1203 Last data filed at 03/12/16 1332  Gross per 24 hour  Intake    240 ml  Output   1975 ml  Net  -1735 ml   Filed Weights   03/09/16 0457 03/10/16 0640 03/12/16 0634  Weight: 94.892 kg (209 lb 3.2 oz) 94 kg (207 lb 3.7 oz) 91.309 kg (201 lb 4.8 oz)    Exam  General: Well developed, well nourished, no distress  HEENT: NCAT, mucous membranes moist.   Cardiovascular: S1 S2 auscultated, RRR, no murmurs  Respiratory: Clear to auscultation bilaterally   Abdomen: Soft, nontender, nondistended, + bowel sounds  Extremities: warm dry without cyanosis clubbing or edema.   Neuro: AAOx3, nonfocal  Psych: Appropriate   Data Reviewed: I have personally reviewed following labs and imaging studies  CBC:  Recent Labs Lab 03/09/16 0412 03/10/16 0340 03/11/16 0657 03/12/16 0619 03/13/16 0602  WBC 6.7 6.8 5.8 6.6 8.7  HGB 8.9* 9.3* 8.3* 8.2* 7.8*  HCT 27.4* 28.8* 25.0* 25.3* 24.2*  MCV 90.7 90.6 90.3 92.7 92.7  PLT 447* 478* 444* 454* 470*   Basic Metabolic Panel:  Recent Labs Lab 03/07/16 0808 03/08/16 0317 03/10/16 0340 03/12/16 0619 03/13/16 0602  NA 136 135 133* 138 133*  K 3.5 3.5 3.5 3.6 3.7  CL 105 107 103 105 102  CO2 25 22 24 24 25   GLUCOSE 96 104* 88 88  96  BUN 5* 5* <5* 5* 8  CREATININE 0.83 0.83 0.85 0.79 0.75  CALCIUM 7.6* 7.3* 7.6* 8.1* 8.0*   GFR: Estimated Creatinine Clearance: 113.2 mL/min (by C-G formula based on Cr of 0.75). Liver Function Tests: No results for input(s): AST, ALT, ALKPHOS, BILITOT, PROT, ALBUMIN in the last 168 hours. No results for input(s): LIPASE, AMYLASE in the last 168 hours. No results for input(s): AMMONIA in the last 168 hours. Coagulation Profile:  Recent Labs Lab 03/09/16 0857 03/10/16 0340 03/11/16 0657 03/12/16 0619 03/13/16 0602  INR 1.44 1.39 1.54* 1.60* 1.47   Cardiac Enzymes: No results for input(s): CKTOTAL, CKMB, CKMBINDEX, TROPONINI in the last 168 hours. BNP (last 3 results) No results for input(s): PROBNP in the last 8760 hours.  HbA1C: No results for input(s): HGBA1C in the last 72 hours. CBG:  Recent Labs Lab 03/12/16 1125 03/12/16 1704 03/12/16 2102 03/13/16 0633 03/13/16 1136  GLUCAP 134* 127* 114* 106* 159*   Lipid Profile: No results for input(s): CHOL, HDL, LDLCALC, TRIG, CHOLHDL, LDLDIRECT in the last 72 hours. Thyroid Function Tests: No results for input(s): TSH, T4TOTAL, FREET4, T3FREE, THYROIDAB in the last 72 hours. Anemia Panel: No results for input(s): VITAMINB12, FOLATE, FERRITIN, TIBC, IRON, RETICCTPCT in the last 72 hours. Urine analysis:    Component Value Date/Time   COLORURINE YELLOW 03/06/2016 1500   APPEARANCEUR CLEAR 03/06/2016 1500   LABSPEC 1.008 03/06/2016 1500   PHURINE 6.5 03/06/2016 1500   GLUCOSEU NEGATIVE 03/06/2016 1500   HGBUR NEGATIVE 03/06/2016 1500   BILIRUBINUR NEGATIVE 03/06/2016 1500   KETONESUR NEGATIVE 03/06/2016 1500   PROTEINUR NEGATIVE 03/06/2016 1500   NITRITE NEGATIVE 03/06/2016 1500   LEUKOCYTESUR NEGATIVE 03/06/2016 1500   Sepsis Labs: @LABRCNTIP (procalcitonin:4,lacticidven:4)  ) Recent Results (from the past 240 hour(s))  Culture, blood (Routine X 2) w Reflex to ID Panel     Status: None   Collection Time:  03/06/16 10:40 AM  Result Value Ref Range Status   Specimen Description BLOOD BLOOD LEFT ARM  Final   Special Requests IN PEDIATRIC BOTTLE 1CC  Final   Culture NO GROWTH 5 DAYS  Final   Report Status 03/11/2016 FINAL  Final  Culture, blood (Routine X 2) w Reflex to ID Panel     Status: None   Collection Time: 03/06/16 10:45 AM  Result Value Ref Range Status   Specimen Description BLOOD BLOOD LEFT ARM  Final   Special Requests IN PEDIATRIC BOTTLE 1CC  Final   Culture NO GROWTH 5 DAYS  Final   Report Status 03/11/2016 FINAL  Final      Radiology Studies: No results found.   Scheduled Meds: . sodium chloride   Intravenous Once  . aspirin EC  81 mg Oral Daily  . atorvastatin  80 mg Oral q1800  . carvedilol  6.25 mg Oral BID WC  . clopidogrel  75 mg Oral Q breakfast  . docusate sodium  100 mg Oral Daily  . insulin aspart  0-15 Units Subcutaneous TID WC  . insulin aspart  0-5 Units Subcutaneous QHS  . insulin aspart  6 Units Subcutaneous TID WC  . insulin glargine  20 Units Subcutaneous QHS  . pantoprazole  40 mg Oral BID AC  . sodium chloride flush  3 mL Intravenous Q12H  . warfarin  7.5 mg Oral ONCE-1800  . warfarin   Does not apply Once  . Warfarin - Pharmacist Dosing Inpatient   Does not apply q1800   Continuous Infusions: . heparin 1,000 Units/hr (03/12/16 2346)  . nitroGLYCERIN Stopped (03/08/16 1200)     LOS: 21 days   Time Spent in minutes   30 minutes  Cherae Marton D.O. on 03/13/2016 at 12:03 PM  Between 7am to 7pm - Pager - [phone]  After 7pm go to [email or web address] - password [email or web address]  And look for the night coverage person covering for me after hours  [facility] Office  [phone]

## 2016-03-13 NOTE — Progress Notes (Addendum)
Dressing and sheet saturated with drainage, patient refused dressing change, patient stated ''I want the Dr. to see the wound'' patient education given about the importance of following MD's order, patient voiced understanding, dressing reinforced, sheets changed, will continue to monitor.

## 2016-03-14 LAB — GLUCOSE, CAPILLARY
GLUCOSE-CAPILLARY: 117 mg/dL — AB (ref 65–99)
Glucose-Capillary: 199 mg/dL — ABNORMAL HIGH (ref 65–99)
Glucose-Capillary: 67 mg/dL (ref 65–99)
Glucose-Capillary: 95 mg/dL (ref 65–99)
Glucose-Capillary: 108 mg/dL — ABNORMAL HIGH (ref 65–99)

## 2016-03-14 LAB — TYPE AND SCREEN
ABO/RH(D): A POS
Antibody Screen: NEGATIVE
Unit division: 0

## 2016-03-14 LAB — CBC
HCT: 25.8 % — ABNORMAL LOW (ref 39.0–52.0)
Hemoglobin: 8.2 g/dL — ABNORMAL LOW (ref 13.0–17.0)
MCH: 28.8 pg (ref 26.0–34.0)
MCHC: 31.8 g/dL (ref 30.0–36.0)
MCV: 90.5 fL (ref 78.0–100.0)
PLATELETS: 477 10*3/uL — AB (ref 150–400)
RBC: 2.85 MIL/uL — ABNORMAL LOW (ref 4.22–5.81)
RDW: 19 % — AB (ref 11.5–15.5)
WBC: 6.8 10*3/uL (ref 4.0–10.5)

## 2016-03-14 LAB — HEPARIN LEVEL (UNFRACTIONATED): HEPARIN UNFRACTIONATED: 0.24 [IU]/mL — AB (ref 0.30–0.70)

## 2016-03-14 LAB — PROTIME-INR
INR: 2.09 — AB (ref 0.00–1.49)
Prothrombin Time: 23.4 seconds — ABNORMAL HIGH (ref 11.6–15.2)

## 2016-03-14 MED ORDER — WARFARIN SODIUM 5 MG PO TABS
5.0000 mg | ORAL_TABLET | Freq: Once | ORAL | Status: AC
  Administered 2016-03-14: 5 mg via ORAL
  Filled 2016-03-14: qty 1

## 2016-03-14 NOTE — Progress Notes (Signed)
ANTICOAGULATION CONSULT NOTE  Pharmacy Consult for warfarin Indication:  LV thrombus and R femoral embolism s/p embolectomy and now s/p cath with stent placement  No Known Allergies  Patient Measurements: Height: 5\' 8"  (172.7 cm) Weight:  (patient refused) IBW/kg (Calculated) : 68.4  Heparin dosing wt: 87 kg  Labs:  Recent Labs  03/12/16 0619 03/12/16 1831 03/13/16 0602 03/14/16 1041  HGB 8.2*  --  7.8* 8.2*  HCT 25.3*  --  24.2* 25.8*  PLT 454*  --  470* 477*  LABPROT 19.1*  --  17.9* 23.4*  INR 1.60*  --  1.47 2.09*  HEPARINUNFRC 0.78* 0.58 0.32 0.24*  CREATININE 0.79  --  0.75  --    Estimated Creatinine Clearance: 113.2 mL/min (by C-G formula based on Cr of 0.75).  Assessment: 55 yo male with LV thrombus s/p R femoral embolectomy and subsequent hematoma evacuation. His heparin was turned off due to complications of bleeding but resumed on 7/8.   Currently on heparin bridge to coumadin post cath 03/09/16 (DES placed). Also on Plavix and ASA 81 mg- plans to continue all 3 for 1 month, then discontinue ASA.  INR therapeutic today at 2.09. Hgb 8.2, plts 477.  Goal of Therapy:  INR 2-3 Monitor platelets by anticoagulation protocol: Yes   Plan:  -Discontinue heparin with therapeutic INR (discussed with Dr. Catha Gosselin) -Warfarin 5mg  po x1 tonight  -Daily CBC and INR  Kenzee Bassin D. Yaslene Lindamood, PharmD, BCPS Clinical Pharmacist Pager: [phone] 03/14/2016 12:34 PM

## 2016-03-14 NOTE — Progress Notes (Signed)
1326 Noted pt would not walk with PT today. We will follow PT's progress and begin seeing when appropriate. Luetta Nutting RN BSN 03/14/2016 1:27 PM

## 2016-03-14 NOTE — Progress Notes (Signed)
PROGRESS NOTE    Troy Yu  KKX:[id number] DOB: 23-Feb-1961 DOA: 02/21/2016 PCP: No primary care provider on file.   Brief Narrative:  Troy Yu is an 55 y.o. male who was in his usual state of health until about 2 weeks ago when he had substernal chest pain that lasted about 48 hours.The pain went away after he vomited, and he attributed his symptoms to a prolonged religious fast. He then come to the [facility] ED on 02/21/16 with severe RLE pain associated with paresthesias. Upon presentation, he was found to have an abnormal EKG and elevated troponin. Cardiology was subsequently called to evaluate the patient, and he was felt to have suffered a subacute anterior MI. STAT echo was ordered and EDP advised to order an arterial duplex with recommendation to consult vascular surgery. IV heparin ordered and the patient was transferred to [facility] for further evaluation by vascular surgery, as it was felt that he had a right femoral embolism with concern for threatened limb. Dr Imogene Burn subsequently performed a right femoral embolectomy and right calf compartment fasciotomies x 4 . At 4:16 a.m. On 02/22/16, the patient complained of loss of sensation to RLE, and the patient reports he was in severe pain. He was given pain medication which did not alleviate his symptoms. He was re-evaluated by Dr. Imogene Burn at 7:33 a.m. Who removed the ACE wrap from his RLE which relieved the pain, and the patient was felt to have excessive bleeding in his calf related to full anti-coagulation. He was taken back to surgery for evacuation of hematoma and VAC dressings. Cardiologist continues to follow for management of his subacute MI. Patient underwent closure of lateral fasciotomy. Plastic surgery consulted and recommendations given. 7/6 patient's hemoglobin dropped to 5 from continuous oozing from the leg wound, and his heparin was stopped on 7/5. Dr Early recommended surgical evaluation by plastic surgery, but patient does not want  any further surgery at this time and wanted to address the bleeding from the leg wound with local wound care. Explained to him that he needs IV heparin for his LV thrombus and he is at risk from cardiac complications without IV heparin. And at the same time he continues to bleed on IV heparin, he needs the Leg wound to be evaluated by the plastic surgery for possible skin graft surgery.  7/7 pt 's hemoglobin improved to 10 , and restarted IV heparin on 7/8, as oozing from the leg has improved and hemoglobin stable at 9.7 7/9 abdominal distention, and abd film shows gas filled small bowel loops, recommended to get OOB and ambulate.  Fevers on and off over the 24 to 36 hrs, blood culture, CXR, UA, and CT abdomen ordered .  7/10 work up revealed CT abd showing cholelithiasis with fingings concerning for early acute cholecystitis but currently he is asymptomatic. US abdomen ordered.   7/11 US abdomen shows a gall stone in the gall bladder neck but he remains asymptomatic at this time . Discussed with Dr Duke Salvia And schedule for cardiac catheterization tomorrow if patient agrees.   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  -Case taken over by Dr. Arbie Cookey after patient requested a second opinion.  -s/p closure of lateral fasciotomy site and placement of  VAC on medial fasciotomy site -Plastic surgery consulted by vascular for possible STSG to medial fasciotomy site, but patient refusing any further surgical procedures.  -Wound vac removed (medial) and Daily hydrogel application and wet-to-dry dressings - 7/4 leg wound continues to ooze blood and dressing changed several times and  Dr Early contacted and IV heparin was discontinued.  - 7/6 his hemoglobin dropped to 5 and he received 3 units of prbc transfusion. Repeat hemoglobin improved .  - 7/8 heparin restarted as his hemoglobin is stable and oozing from the wound has decreased.  -7/10 no changes in management.  -Coumadin started 7/12. Will discontinue heparin today as INR is 2.09 -Continue dressing changes per vascular recommendations- dressing change today -Spoke with Dr. Arbie Cookey (together in pateint's room)- continue dressing changes.  Explained to patient that it will take months for wound to close.  Patient may be discharged when INR is therapeutic.  -PT has tried to work with patient several times and he has refused -Will speak to case manager regarding any home health options, particularly with wound care -Patient does not want to go to SNF  Symptomatic anemia/ Postoperative anemia/ Anemia secondary to blood loss -During hospitalization, hemoglobin has dropped to as low as 4.7 , continues to drop , requiring many prbc transfusions.  -Has received a total of 11 u PRBCs this admission -transfuse to keep hemoglobin greater than 8.  -Hemoglobin currently 8.2 -Continue to monitor CBC   Syncope and orthostasis -Likely secondary to blood loss -Continue to monitor closely  Anterior subendocardial MI (HCC)/Abnormal EKG/Elevated troponin -On therapeutic dose heparin, which was held on 7/5 for continuous oozing from the leg wound. Restarted IV heparin on 7/8 as his oozing from the leg wound decreased and his hemoglobin is stable.  -Cardiology consulted and appreciated -ACE-I to be started when BP able to tolerate -s/p DES to distal RCA and mid LAD -Started on plavix, aspririn, coumadin (discontinue aspirin after 1 month)  Newly diagnosed diabetes mellitus/Hyperglycemia -Hemoglobin A1c 9.2.  -Continue lantus, ISS and CBG monitoring.  -Diabetes coordinator consulted  Hyponatremia -Serum osm 273 , urine  osmo, TSH and am cortisol normal limits. .  -Continue to monitor BMP intermittently.  Abdominal distention, and constipation: -patientreported abdominal pain and feeling bloated. AXR shows air filled small bowels, probably from small bowel dysmotility. Recommended he get out of bed and ambulate which will help relieve the gas, and help with constipation. -Improved with enema  Fevers  -on and OFF associated with chills, none today.  -Initially thought to be from blood transfusions -Septic work up including: Blood cultures ordered, CXR, UA, and CT abd and pelvis without contrast ordered for further evaluation. CT abd shows cholelithiasis with fingings concerning for early acute cholecystitis.  -US abdomen ordered, which showed mildly dilated gall bladder, with a 9 mm gall bladder stone impacted in the necko f the gall bladder, no pericholecystic fluid no wall thickening. Negative murphy's sign. He is currently asymptomatic. Monitor. No further intervention needed at this time.   DVT Prophylaxis  Coumadin  Code Status: Full  Family Communication:  Son at bedside  Disposition Plan: Admitted. Possible discharge within the next 24-48 hours  Consultants Cardiology  Vascular Surgery Plastic surgery  Procedures  Right femoral embolectomy Right calf four compartment fasciotomies  Evacuation of hematoma from bilateral right calf fasciotomies Placement of negative pressure dressings x2 Closure of lateral fasciotomy site and placement of VAC on medial fasciotomy site Cardiac catheterization, DES to distal RCA and Mid LAD  Antibiotics  Anti-infectives    Start     Dose/Rate Route Frequency Ordered Stop   02/26/16 0930  [MAR Hold]  cefUROXime (ZINACEF) 1.5 g in dextrose 5 % 50 mL IVPB     (MAR Hold since 02/26/16 0928)   1.5 g 100 mL/hr over 30 Minutes Intravenous To ShortStay Surgical 02/25/16 1055 02/26/16 1120   02/22/16 0800  cefUROXime (ZINACEF) 1.5 g in dextrose 5 % 50 mL  IVPB     1.5 g 100 mL/hr over 30 Minutes Intravenous Every 12 hours 02/21/16 2115 02/22/16 2140   02/22/16 0600  cefUROXime (ZINACEF) injection 1.5 g     1.5 g Intramuscular On call to O.R. 02/21/16 1523 02/21/16 1713      Subjective:   Troy Yu seen and examined today.  Patient did not want labs drawn this morning.  Had to explain to him along with Dr. Arbie Cookey, the risks of PICC placement.  Denies chest pain, Or dose of breath abdominal pain, nausea, vomiting, diarrhea, headache or dizziness. Continues complain of right leg pain.    Objective:   Filed Vitals:   03/13/16 1630 03/13/16 2143 03/14/16 0630 03/14/16 0852  BP: 100/60 109/65 95/63 112/69  Pulse: 92 95 85 81  Temp: 98.4 F (36.9 C) 97.7 F (36.5 C) 98.7 F (37.1 C) 97.8 F (36.6 C)  TempSrc: Oral Oral Oral Oral  Resp: 20 18 18 18   Height:      Weight:      SpO2: 97% 99% 98% 100%    Intake/Output Summary (Last 24 hours) at 03/14/16 1341 Last data filed at 03/14/16 0630  Gross per 24 hour  Intake    360 ml  Output   5225 ml  Net  -4865 ml   Filed Weights   03/09/16 0457 03/10/16 0640 03/12/16 0634  Weight: 94.892 kg (209 lb 3.2 oz) 94 kg (207 lb 3.7 oz) 91.309 kg (201 lb 4.8 oz)    Exam  General: Well developed, well nourished, no distress  HEENT: NCAT, mucous membranes moist.   Cardiovascular: S1 S2 auscultated, RRR, no murmurs  Respiratory: Clear to auscultation bilaterally   Abdomen: Soft, nontender, nondistended, + bowel sounds  Extremities: warm dry without cyanosis clubbing or edema.  RLE wrapped, dressing clean  Neuro: AAOx3, nonfocal  Psych: Appropriate mood and affect, pleasant   Data Reviewed: I have personally reviewed following labs and imaging studies  CBC:  Recent Labs Lab 03/10/16 0340 03/11/16 0657 03/12/16 0619 03/13/16 0602 03/14/16 1041  WBC 6.8 5.8 6.6 8.7 6.8  HGB 9.3* 8.3* 8.2* 7.8* 8.2*  HCT 28.8* 25.0* 25.3* 24.2* 25.8*  MCV 90.6 90.3 92.7 92.7 90.5    PLT 478* 444* 454* 470* 477*   Basic Metabolic Panel:  Recent Labs Lab 03/08/16 0317 03/10/16 0340 03/12/16 0619 03/13/16 0602  NA 135 133* 138 133*  K 3.5 3.5 3.6 3.7  CL 107 103 105 102  CO2 22 24 24 25   GLUCOSE 104* 88 88 96  BUN 5* <5* 5* 8  CREATININE 0.83 0.85 0.79 0.75  CALCIUM 7.3* 7.6* 8.1* 8.0*   GFR: Estimated Creatinine Clearance: 113.2 mL/min (by C-G formula based on Cr of 0.75). Liver Function Tests: No results for input(s): AST, ALT, ALKPHOS, BILITOT, PROT, ALBUMIN in the last 168 hours. No results for input(s): LIPASE, AMYLASE in the last 168 hours. No results for input(s): AMMONIA in the last 168 hours. Coagulation Profile:  Recent Labs Lab 03/10/16 0340 03/11/16 0657 03/12/16 0619 03/13/16 0602 03/14/16 1041  INR 1.39 1.54* 1.60* 1.47 2.09*   Cardiac Enzymes: No results for input(s): CKTOTAL, CKMB, CKMBINDEX, TROPONINI in the last 168 hours. BNP (last 3 results) No results for input(s): PROBNP in the last 8760 hours. HbA1C: No results for input(s): HGBA1C in the last 72 hours. CBG:  Recent Labs Lab 03/13/16 1700 03/13/16 2108 03/14/16 0623 03/14/16 1144 03/14/16 1255  GLUCAP 194* 156* 199* 67 95   Lipid Profile: No results for input(s): CHOL, HDL, LDLCALC, TRIG, CHOLHDL, LDLDIRECT in the last 72 hours. Thyroid Function Tests: No results for input(s): TSH, T4TOTAL, FREET4, T3FREE, THYROIDAB in the last 72 hours. Anemia Panel: No results for input(s): VITAMINB12, FOLATE, FERRITIN, TIBC, IRON, RETICCTPCT in the last 72 hours. Urine analysis:    Component Value Date/Time   COLORURINE YELLOW 03/06/2016 1500   APPEARANCEUR CLEAR 03/06/2016 1500   LABSPEC 1.008 03/06/2016 1500   PHURINE 6.5 03/06/2016 1500   GLUCOSEU NEGATIVE 03/06/2016 1500   HGBUR NEGATIVE 03/06/2016 1500   BILIRUBINUR NEGATIVE 03/06/2016 1500   KETONESUR NEGATIVE 03/06/2016 1500   PROTEINUR NEGATIVE 03/06/2016 1500   NITRITE NEGATIVE 03/06/2016 1500    LEUKOCYTESUR NEGATIVE 03/06/2016 1500   Sepsis Labs: @LABRCNTIP (procalcitonin:4,lacticidven:4)  ) Recent Results (from the past 240 hour(s))  Culture, blood (Routine X 2) w Reflex to ID Panel     Status: None   Collection Time: 03/06/16 10:40 AM  Result Value Ref Range Status   Specimen Description BLOOD BLOOD LEFT ARM  Final   Special Requests IN PEDIATRIC BOTTLE 1CC  Final   Culture NO GROWTH 5 DAYS  Final   Report Status 03/11/2016 FINAL  Final  Culture, blood (Routine X 2) w Reflex to ID Panel     Status: None   Collection Time: 03/06/16 10:45 AM  Result Value Ref Range Status   Specimen Description BLOOD BLOOD LEFT ARM  Final   Special Requests IN PEDIATRIC BOTTLE 1CC  Final   Culture NO GROWTH 5 DAYS  Final   Report Status 03/11/2016 FINAL  Final      Radiology Studies: No results found.   Scheduled Meds: . sodium chloride   Intravenous Once  . aspirin EC  81 mg Oral Daily  . atorvastatin  80 mg Oral q1800  . carvedilol  6.25 mg Oral BID WC  . clopidogrel  75 mg Oral Q breakfast  . docusate sodium  100 mg Oral Daily  . insulin aspart  0-15 Units Subcutaneous TID WC  . insulin aspart  0-5 Units Subcutaneous QHS  . insulin aspart  6 Units Subcutaneous TID WC  . insulin glargine  20 Units Subcutaneous QHS  . pantoprazole  40 mg Oral BID AC  . sodium chloride flush  3 mL Intravenous Q12H  . warfarin  5 mg Oral ONCE-1800  . warfarin   Does not apply Once  . Warfarin - Pharmacist Dosing Inpatient   Does not apply q1800   Continuous Infusions: . nitroGLYCERIN Stopped (03/08/16 1200)     LOS: 22 days   Time Spent in minutes   30 minutes  Aleem Elza D.O. on 03/14/2016 at 1:41 PM  Between 7am to 7pm - Pager - [phone]  After 7pm go to [email or web address] - password [email or web address]  And look for the night coverage person covering for me after hours  [facility] Office  [phone]

## 2016-03-14 NOTE — Progress Notes (Signed)
Patient ID: Troy Yu, male   DOB: 18-May-1961, 55 y.o.   MRN: [id number] Head resting change this morning. Does have some eschar present on the lateral aspect with the sutures and staples intact. The medial aspect continues to have very large area. There was some arterial oozing had removal of the gauze but this was easily controlled. This was treated with hydrogel and saline and redressed. Again discussed options with the patient. I recommended a plastic surgery evaluation for consideration of split-thickness skin graft. He continues to refuse consider this. Wishes to continue with local wound care and the eventual closure. Explained this could take months. He understands but does not want any further surgery. Should be okay for discharge after Coumadin level is therapeutic

## 2016-03-14 NOTE — Progress Notes (Signed)
Physical Therapy Treatment Patient Details Name: Troy Yu MRN: [id number] DOB: 03-Jan-1961 Today's Date: 03/14/2016    History of Present Illness 55 y.o. male with no significant hx of cad/pvd/angina/claudication. 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 adm with complaints of red right leg pain; + NSTEMI subacute; EF 30-35%; +left ventricle thrombus; +Rt femoral embolism, 6/25 femoral embolectomy and four compartment calf fasciotomies; 6/26 Evacuation of hematoma from bilateral right calf fasciotomies with placement of negative pressure dressings x 2 with plan for partial wound closeure with VAC replacement on 02/26/16.  On 02/08/16 pt underwent successful angioplasty and drug-eluting stent placement to the distal right coronary artery and mid left anterior descending artery.    PT Comments    Despite max encouragement and education on the importance of mobility, pt refuses to get OOB as he is concerned about bleeding in RLE with movement. Reviewed LE and UE exercises using theraband. Encouraged sitting up in chair later with nursing to eat lunch/dinner. "I will see how my body feels." Will follow acutely. Pt reports he will attempt OOB next session and adamant about using crutches.   Follow Up Recommendations  SNF;Supervision for mobility/OOB     Equipment Recommendations  Rolling walker with 5" wheels    Recommendations for Other Services       Precautions / Restrictions Precautions Precautions: Fall    Mobility  Bed Mobility Overal bed mobility:  (NA as pt refusing any mobility )                Transfers                    Ambulation/Gait                 Stairs            Wheelchair Mobility    Modified Rankin (Stroke Patients Only)       Balance                                    Cognition Arousal/Alertness:  Awake/alert Behavior During Therapy: WFL for tasks assessed/performed Overall Cognitive Status: Impaired/Different from baseline Area of Impairment: Awareness           Awareness: Intellectual   General Comments: Poor insight into situation, implications of actions. Despite education on importance of mobility, pt continuing to refuse activity due to "wanting RLE to rest and not to bleed."    Exercises General Exercises - Upper Extremity Shoulder Flexion: Strengthening;Right;Left;10 reps;Supine;Theraband Theraband Level (Shoulder Flexion): Level 3 (Green) Elbow Flexion: Strengthening;Both;10 reps;Supine General Exercises - Lower Extremity Ankle Circles/Pumps: Both;5 reps;Supine Straight Leg Raises: Right;5 reps;Supine Hip Flexion/Marching: Right;5 reps;Supine    General Comments        Pertinent Vitals/Pain Pain Assessment: Faces Faces Pain Scale: Hurts a little bit Pain Location: Rt LE. Pain Descriptors / Indicators: Sore Pain Intervention(s): Monitored during session;Premedicated before session;Repositioned;Limited activity within patient's tolerance    Home Living                      Prior Function            PT Goals (current goals can now be found in the care plan section) Progress towards PT goals: Not progressing toward goals - comment (due to  refusal to mobilize.)    Frequency  Min 3X/week    PT Plan Current plan remains appropriate    Co-evaluation             End of Session   Activity Tolerance: Patient limited by pain Patient left: in bed;with call bell/phone within reach;with bed alarm set;with family/visitor present     Time: 1191-4782 PT Time Calculation (min) (ACUTE ONLY): 14 min  Charges:  $Therapeutic Exercise: 8-22 mins                    G Codes:      Josia Cueva A Eather Chaires 03/14/2016, 11:24 AM Mylo Red, PT, DPT [phone]

## 2016-03-15 DIAGNOSIS — Z955 Presence of coronary angioplasty implant and graft: Secondary | ICD-10-CM | POA: Insufficient documentation

## 2016-03-15 LAB — BASIC METABOLIC PANEL
Anion gap: 6 (ref 5–15)
CHLORIDE: 104 mmol/L (ref 101–111)
CO2: 26 mmol/L (ref 22–32)
CREATININE: 0.75 mg/dL (ref 0.61–1.24)
Calcium: 8.5 mg/dL — ABNORMAL LOW (ref 8.9–10.3)
GFR calc Af Amer: >60 mL/min (ref >60)
GFR calc non Af Amer: >60 mL/min (ref >60)
GLUCOSE: 134 mg/dL — AB (ref 65–99)
Potassium: 3.8 mmol/L (ref 3.5–5.1)
Sodium: 136 mmol/L (ref 135–145)

## 2016-03-15 LAB — CBC
HEMATOCRIT: 26.7 % — AB (ref 39.0–52.0)
HEMOGLOBIN: 8.6 g/dL — AB (ref 13.0–17.0)
MCH: 28.8 pg (ref 26.0–34.0)
MCHC: 32.2 g/dL (ref 30.0–36.0)
MCV: 89.3 fL (ref 78.0–100.0)
Platelets: 465 10*3/uL — ABNORMAL HIGH (ref 150–400)
RBC: 2.99 MIL/uL — ABNORMAL LOW (ref 4.22–5.81)
RDW: 19.1 % — ABNORMAL HIGH (ref 11.5–15.5)
WBC: 5.9 10*3/uL (ref 4.0–10.5)

## 2016-03-15 LAB — GLUCOSE, CAPILLARY
Glucose-Capillary: 147 mg/dL — ABNORMAL HIGH (ref 65–99)
Glucose-Capillary: 131 mg/dL — ABNORMAL HIGH (ref 65–99)
Glucose-Capillary: 169 mg/dL — ABNORMAL HIGH (ref 65–99)
Glucose-Capillary: 195 mg/dL — ABNORMAL HIGH (ref 65–99)

## 2016-03-15 MED ORDER — WARFARIN SODIUM 5 MG PO TABS
5.0000 mg | ORAL_TABLET | Freq: Once | ORAL | Status: AC
  Administered 2016-03-15: 5 mg via ORAL
  Filled 2016-03-15: qty 1

## 2016-03-15 NOTE — Progress Notes (Signed)
1515 We have completed education with pt and signing off as pt only getting to chair with PT. Luetta Nutting RN BSN 03/15/2016 3:16 PM

## 2016-03-15 NOTE — Progress Notes (Signed)
PROGRESS NOTE    Troy Yu  ZOX:[id number] DOB: 08/26/1961 DOA: 02/21/2016 PCP: No primary care provider on file.   Brief Narrative:  [name] is a 29M with diabetes here with subacute anterior MI complicated by LV thrombus and right femoral artery embolism now s/p embolectomy and R calf fasciotomy for compartment syndrome. Underwent PCI.  This was complicated by post operative anemia and has received several units of PRBCs.  Heparin discontinued as INR is therapeutic on coumadin.  Continue wound care as per vascular. Patient refusing plastic surgery intervention.  Needs to work with PT. Refuses SNF.  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  -Case taken over by Dr. Arbie Cookey after patient requested a second opinion.  -s/p closure of lateral fasciotomy site and placement of VAC on medial fasciotomy site -Plastic surgery consulted by vascular for possible STSG to medial fasciotomy site, but patient refusing any further surgical procedures.  -Wound vac removed (medial) and Daily hydrogel application and wet-to-dry dressings - 7/4 leg wound continues to ooze blood and dressing changed several times and Dr Early contacted and IV heparin was discontinued.  - 7/6 his hemoglobin dropped to 5 and he received 3 units of prbc transfusion. Repeat hemoglobin improved .  - 7/8 heparin restarted as his hemoglobin is stable and oozing from the wound has decreased.  -7/10 no changes in management.  -Coumadin started 7/12. Will discontinue heparin today as INR is 2.09 -Continue dressing changes per vascular recommendations- dressing change today -Spoke with Dr. Arbie Cookey (together  in pateint's room)- continue dressing changes.  Explained to patient that it will take months for wound to close.  Patient may be discharged when INR is therapeutic.  -PT has tried to work with patient several times and he has refused -Will speak to case manager regarding any home health options, particularly with wound care -Patient does not want to go to SNF  Symptomatic anemia/ Postoperative anemia/ Anemia secondary to blood loss -During hospitalization, hemoglobin has dropped to as low as 4.7 , continues to drop , requiring many prbc transfusions.  -Has received a total of 11 u PRBCs this admission -transfuse to keep hemoglobin greater than 8.  -Hemoglobin currently 8.6 -Continue to monitor CBC   Syncope and orthostasis -Likely secondary to blood loss -Continue to monitor closely  Anterior subendocardial MI (HCC)/Abnormal EKG/Elevated troponin -On therapeutic dose heparin, which was held on 7/5 for continuous oozing from the leg wound. Restarted IV heparin on 7/8 as his oozing from the leg wound decreased and his hemoglobin is stable.  -Cardiology consulted and appreciated -ACE-I to be started when BP able to tolerate -s/p DES to distal RCA and mid LAD -Started on plavix, aspririn, coumadin (discontinue aspirin after 1 month)  Newly diagnosed diabetes mellitus/Hyperglycemia -Hemoglobin A1c 9.2.  -Continue lantus, ISS and CBG monitoring.  -Diabetes coordinator consulted  Hyponatremia -Serum osm 273 , urine osmo, TSH and am cortisol normal limits. .  -Continue to monitor BMP intermittently.  Abdominal distention, and constipation: -patientreported abdominal pain and feeling bloated. AXR shows air filled small bowels, probably from small bowel dysmotility. Recommended he get out of bed and ambulate which will help relieve the gas, and help with constipation. -Improved with enema  Fevers  -on  and OFF associated with chills, none today.  -Initially thought to be from  blood transfusions -Septic work up including: Blood cultures ordered, CXR, UA, and CT abd and pelvis without contrast ordered for further evaluation. CT abd shows cholelithiasis with fingings concerning for early acute cholecystitis.  -US abdomen ordered, which showed mildly dilated gall bladder, with a 9 mm gall bladder stone impacted in the necko f the gall bladder, no pericholecystic fluid no wall thickening. Negative murphy's sign. He is currently asymptomatic. Monitor. No further intervention needed at this time.   DVT Prophylaxis  Coumadin  Code Status: Full  Family Communication:  Son at bedside  Disposition Plan: Admitted. Possible discharge within the next 24-48 hours.   Consultants Cardiology  Vascular Surgery Plastic surgery  Procedures  Right femoral embolectomy Right calf four compartment fasciotomies  Evacuation of hematoma from bilateral right calf fasciotomies Placement of negative pressure dressings x2 Closure of lateral fasciotomy site and placement of VAC on medial fasciotomy site Cardiac catheterization, DES to distal RCA and Mid LAD  Antibiotics   Anti-infectives    Start     Dose/Rate Route Frequency Ordered Stop   02/26/16 0930  [MAR Hold]  cefUROXime (ZINACEF) 1.5 g in dextrose 5 % 50 mL IVPB     (MAR Hold since 02/26/16 0928)   1.5 g 100 mL/hr over 30 Minutes Intravenous To ShortStay Surgical 02/25/16 1055 02/26/16 1120   02/22/16 0800  cefUROXime (ZINACEF) 1.5 g in dextrose 5 % 50 mL IVPB     1.5 g 100 mL/hr over 30 Minutes Intravenous Every 12 hours 02/21/16 2115 02/22/16 2140   02/22/16 0600  cefUROXime (ZINACEF) injection 1.5 g     1.5 g Intramuscular On call to O.R. 02/21/16 1523 02/21/16 1713      Subjective:   [name] seen and examined today.   Denies chest pain, shortness of breath, abdominal pain, nausea, vomiting, diarrhea, headache or dizziness. Continues complain of right leg pain. Plans to work with PT today. Does not want  rehab placement.    Objective:   Filed Vitals:   03/14/16 1713 03/14/16 2055 03/15/16 0509 03/15/16 0818  BP: 101/61 100/58 102/61 100/58  Pulse: 91 90 80 84  Temp:  98.7 F (37.1 C) 98.6 F (37 C) 98.9 F (37.2 C)  TempSrc:  Oral Oral Oral  Resp:  19 18 18   Height:      Weight:   86.818 kg (191 lb 6.4 oz)   SpO2:  99% 99% 99%    Intake/Output Summary (Last 24 hours) at 03/15/16 1128 Last data filed at 03/15/16 0901  Gross per 24 hour  Intake    480 ml  Output   4875 ml  Net  -4395 ml   Filed Weights   03/10/16 0640 03/12/16 0634 03/15/16 0509  Weight: 94 kg (207 lb 3.7 oz) 91.309 kg (201 lb 4.8 oz) 86.818 kg (191 lb 6.4 oz)    Exam   General: Well developed, well nourished, no distress  HEENT: NCAT, mucous membranes moist.   Cardiovascular: S1 S2 auscultated, RRR, no murmurs  Extremities: warm dry without cyanosis clubbing or edema.  RLE wrapped, dressing clean  Neuro: AAOx3, nonfocal  Psych: Appropriate mood and affect, pleasant   Data Reviewed: I have personally reviewed following labs and imaging studies  CBC:  Recent Labs Lab 03/11/16 0657 03/12/16 0619 03/13/16 0602 03/14/16 1041 03/15/16 0845  WBC 5.8 6.6 8.7 6.8 5.9  HGB 8.3* 8.2* 7.8* 8.2* 8.6*  HCT 25.0*  25.3* 24.2* 25.8* 26.7*  MCV 90.3 92.7 92.7 90.5 89.3  PLT 444* 454* 470* 477* 465*   Basic Metabolic Panel:  Recent Labs Lab 03/10/16 0340 03/12/16 0619 03/13/16 0602 03/15/16 0845  NA 133* 138 133* 136  K 3.5 3.6 3.7 3.8  CL 103 105 102 104  CO2 GLUCOSE 88 88 96 134*  BUN <5* 5* 8 <5*  CREATININE 0.85 0.79 0.75 0.75  CALCIUM 7.6* 8.1* 8.0* 8.5*   GFR: Estimated Creatinine Clearance: 110.5 mL/min (by C-G formula based on Cr of 0.75). Liver Function Tests: No results for input(s): AST, ALT, ALKPHOS, BILITOT, PROT, ALBUMIN in the last 168 hours. No results for input(s): LIPASE, AMYLASE in the last 168 hours. No results for input(s): AMMONIA in the last 168  hours. Coagulation Profile:  Recent Labs Lab 03/10/16 0340 03/11/16 0657 03/12/16 0619 03/13/16 0602 03/14/16 1041  INR 1.39 1.54* 1.60* 1.47 2.09*   Cardiac Enzymes: No results for input(s): CKTOTAL, CKMB, CKMBINDEX, TROPONINI in the last 168 hours. BNP (last 3 results) No results for input(s): PROBNP in the last 8760 hours. HbA1C: No results for input(s): HGBA1C in the last 72 hours. CBG:  Recent Labs Lab 03/14/16 1144 03/14/16 1255 03/14/16 1759 03/14/16 2138 03/15/16 0634  GLUCAP 67 95 108* 117* 147*   Lipid Profile: No results for input(s): CHOL, HDL, LDLCALC, TRIG, CHOLHDL, LDLDIRECT in the last 72 hours. Thyroid Function Tests: No results for input(s): TSH, T4TOTAL, FREET4, T3FREE, THYROIDAB in the last 72 hours. Anemia Panel: No results for input(s): VITAMINB12, FOLATE, FERRITIN, TIBC, IRON, RETICCTPCT in the last 72 hours. Urine analysis:    Component Value Date/Time   COLORURINE YELLOW 03/06/2016 1500   APPEARANCEUR CLEAR 03/06/2016 1500   LABSPEC 1.008 03/06/2016 1500   PHURINE 6.5 03/06/2016 1500   GLUCOSEU NEGATIVE 03/06/2016 1500   HGBUR NEGATIVE 03/06/2016 1500   BILIRUBINUR NEGATIVE 03/06/2016 1500   KETONESUR NEGATIVE 03/06/2016 1500   PROTEINUR NEGATIVE 03/06/2016 1500   NITRITE NEGATIVE 03/06/2016 1500   LEUKOCYTESUR NEGATIVE 03/06/2016 1500   Sepsis Labs: (procalcitonin:4,lacticidven:4)  ) Recent Results (from the past 240 hour(s))  Culture, blood (Routine X 2) w Reflex to ID Panel     Status: None   Collection Time: 03/06/16 10:40 AM  Result Value Ref Range Status   Specimen Description BLOOD BLOOD LEFT ARM  Final   Special Requests IN PEDIATRIC BOTTLE 1CC  Final   Culture NO GROWTH 5 DAYS  Final   Report Status 03/11/2016 FINAL  Final  Culture, blood (Routine X 2) w Reflex to ID Panel     Status: None   Collection Time: 03/06/16 10:45 AM  Result Value Ref Range Status   Specimen Description BLOOD BLOOD LEFT ARM  Final    Special Requests IN PEDIATRIC BOTTLE 1CC  Final   Culture NO GROWTH 5 DAYS  Final   Report Status 03/11/2016 FINAL  Final      Radiology Studies: No results found.   Scheduled Meds: . sodium chloride   Intravenous Once  . aspirin EC  81 mg Oral Daily  . atorvastatin  80 mg Oral q1800  . carvedilol  6.25 mg Oral BID WC  . clopidogrel  75 mg Oral Q breakfast  . docusate sodium  100 mg Oral Daily  . insulin aspart  0-15 Units Subcutaneous TID WC  . insulin aspart  0-5 Units Subcutaneous QHS  . insulin aspart  6 Units Subcutaneous TID WC  . insulin glargine  20 Units Subcutaneous QHS  . pantoprazole  40 mg Oral BID AC  . sodium chloride flush  3 mL Intravenous Q12H  . warfarin  5 mg Oral ONCE-1800  . Warfarin - Pharmacist Dosing Inpatient   Does not apply q1800   Continuous Infusions: . nitroGLYCERIN Stopped (03/08/16 1200)     LOS: 23 days   Time Spent in minutes   30 minutes  Marlos Carmen D.O. on 03/15/2016 at 11:28 AM  Between 7am to 7pm - Pager - [phone]  After 7pm go to [email or web address] - password [email or web address]  And look for the night coverage person covering for me after hours  [facility] Office  [phone]

## 2016-03-15 NOTE — Progress Notes (Signed)
Wound dressing changed by Pearson Grippe( unit director) this RN, and Katrina, RN as ordered. Patient requesting for the surgeon to assess his wound in the morning. Will continue to monitor

## 2016-03-15 NOTE — Progress Notes (Signed)
Occupational Therapy Treatment Patient Details Name: Troy Yu MRN: [id number] DOB: 06/17/1961 Today's Date: 03/15/2016    History of present illness 55 y.o. male with no significant hx of cad/pvd/angina/claudication. 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 adm with complaints of red right leg pain; + NSTEMI subacute; EF 30-35%; +left ventricle thrombus; +Rt femoral embolism, 6/25 femoral embolectomy and four compartment calf fasciotomies; 6/26 Evacuation of hematoma from bilateral right calf fasciotomies with placement of negative pressure dressings x 2 with plan for partial wound closeure with VAC replacement on 02/26/16.  On 02/08/16 pt underwent successful angioplasty and drug-eluting stent placement to the distal right coronary artery and mid left anterior descending artery.   OT comments  Pt agreeable to mobilize OOB today. Pt completed basic transfers (x2 sit-stand/x1 stand-pivot) with min guard assist and max verbal cues for safe hand placement and RW use. Pt hopped on LLE and still unable to tolerate any weight-bearing on RLE. Encouaged pt to c ontinue to mobilize every day with therapy and nursing staff since his goal is to return home and continues to refuse SNF for post-acute rehab. Currently recommending HHOT upon d/c.   Follow Up Recommendations  Home health OT;Supervision/Assistance - 24 hour    Equipment Recommendations  3 in 1 bedside comode    Recommendations for Other Services      Precautions / Restrictions Precautions Precautions: Fall Restrictions Weight Bearing Restrictions: No       Mobility Bed Mobility Overal bed mobility: Needs Assistance Bed Mobility: Supine to Sit     Supine to sit: Min guard     General bed mobility comments: Min guard assist for safety.   Transfers Overall transfer level: Needs assistance Equipment used: Rolling walker (2  wheeled) Transfers: Sit to/from Stand Sit to Stand: Min guard         General transfer comment: Min guard assist for safety and to balance. Max VCs for safe hand placement and proper RW use. Pt continues to pick up RW instead of rolling it in between hopping steps.     Balance Overall balance assessment: Needs assistance Sitting-balance support: No upper extremity supported;Feet supported Sitting balance-Leahy Scale: Good     Standing balance support: Bilateral upper extremity supported;During functional activity Standing balance-Leahy Scale: Poor                     ADL Overall ADL's : Needs assistance/impaired     Grooming: Set up;Sitting   Upper Body Bathing: Set up;Sitting   Lower Body Bathing: Set up;Sit to/from stand   Upper Body Dressing : Set up;Sitting   Lower Body Dressing: Set up;Sit to/from stand   Toilet Transfer: Min guard;Cueing for safety;BSC;RW   Toileting- Water quality scientist and Hygiene: Min guard;Sit to/from stand       Functional mobility during ADLs: Electronics engineer     Praxis      Cognition   Behavior During Therapy: [facility] for tasks assessed/performed Overall Cognitive Status: Impaired/Different from baseline Area of Impairment: Awareness;Problem solving;Safety/judgement          Safety/Judgement: Decreased awareness of safety Awareness: Emergent Problem Solving: Slow processing;Requires verbal cues General Comments: Pt with emerging insight into situation and importance of  mobilizing to be able to return home with assistance from family. Pt continues to be concerned about RLE starting to bleed when mobilizing, but is more receptive to participating in therapy.    Extremity/Trunk Assessment               Exercises     Shoulder Instructions       General Comments      Pertinent Vitals/ Pain       Pain Assessment: Faces Faces Pain Scale: Hurts  little more Pain Location: R calf Pain Descriptors / Indicators: Operative site guarding;Sore Pain Intervention(s): Monitored during session;Limited activity within patient's tolerance;Repositioned;Patient requesting pain meds-RN notified  Home Living                                          Prior Functioning/Environment              Frequency Min 3X/week     Progress Toward Goals  OT Goals(current goals can now be found in the care plan section)  Progress towards OT goals: Goals met and updated - see care plan  Acute Rehab OT Goals Patient Stated Goal: to walk and go home after being in hospital OT Goal Formulation: With patient Time For Goal Achievement: 03/29/16 Potential to Achieve Goals: Good ADL Goals Pt Will Perform Grooming: with supervision;standing Pt Will Perform Lower Body Bathing: with supervision;sit to/from stand Pt Will Perform Lower Body Dressing: with supervision;sit to/from stand Pt Will Transfer to Toilet: with supervision;ambulating;bedside commode (over toilet) Pt Will Perform Toileting - Clothing Manipulation and hygiene: with supervision;sit to/from stand  Plan Discharge plan needs to be updated;Frequency needs to be updated    Co-evaluation                 End of Session Equipment Utilized During Treatment: Gait belt;Rolling walker   Activity Tolerance Patient tolerated treatment well   Patient Left in chair;with call bell/phone within reach;with chair alarm set   Nurse Communication Mobility status        Time: 7737-3668 OT Time Calculation (min): 31 min  Charges: OT General Charges $OT Visit: 1 Procedure OT Treatments $Self Care/Home Management : 23-37 mins  Redmond Baseman, OTR/L Pager: [phone] 03/15/2016, 1:18 PM

## 2016-03-15 NOTE — Progress Notes (Signed)
ANTICOAGULATION CONSULT NOTE  Pharmacy Consult for warfarin Indication:  LV thrombus and R femoral embolism s/p embolectomy and now s/p cath with stent placement  No Known Allergies  Patient Measurements: Height: 5\' 8"  (172.7 cm) Weight: 191 lb 6.4 oz (86.818 kg) IBW/kg (Calculated) : 68.4  Heparin dosing wt: 87 kg  Labs:  Recent Labs  03/12/16 1831  03/13/16 0602 03/14/16 1041 03/15/16 0845  HGB  --   < > 7.8* 8.2* 8.6*  HCT  --   --  24.2* 25.8* 26.7*  PLT  --   --  470* 477* 465*  LABPROT  --   --  17.9* 23.4*  --   INR  --   --  1.47 2.09*  --   HEPARINUNFRC 0.58  --  0.32 0.24*  --   CREATININE  --   --  0.75  --  0.75  < > = values in this interval not displayed. Estimated Creatinine Clearance: 110.5 mL/min (by C-G formula based on Cr of 0.75).  Assessment: 55 yo male with LV thrombus s/p R femoral embolectomy and subsequent hematoma evacuation. His heparin was turned off due to complications of bleeding but resumed on 7/8.   Was on heparin, but this was stopped yesterday given therapeutic INR. Also on Plavix and ASA 81 mg- plans to continue all 3 for 1 month, then discontinue ASA.  Patient refused labs early this morning, was then accepting however INR was not reordered. Patient has been refusing some labs and would avoid redrawing blood for just one lab at this time. Would not expect INR to have dropped as he was therapeutic yesterday and did receive dose of warfarin last evening.  Goal of Therapy:  INR 2-3 Monitor platelets by anticoagulation protocol: Yes   Plan:  -Warfarin 5mg  po x1 tonight  -Daily CBC and INR  Abiel Antrim D. Tamir Wallman, PharmD, BCPS Clinical Pharmacist Pager: [phone] 03/15/2016 10:44 AM

## 2016-03-16 DIAGNOSIS — Z955 Presence of coronary angioplasty implant and graft: Secondary | ICD-10-CM

## 2016-03-16 DIAGNOSIS — I999 Unspecified disorder of circulatory system: Secondary | ICD-10-CM

## 2016-03-16 LAB — GLUCOSE, CAPILLARY
GLUCOSE-CAPILLARY: 101 mg/dL — AB (ref 65–99)
GLUCOSE-CAPILLARY: 149 mg/dL — AB (ref 65–99)
Glucose-Capillary: 165 mg/dL — ABNORMAL HIGH (ref 65–99)

## 2016-03-16 LAB — BASIC METABOLIC PANEL
Anion gap: 5 (ref 5–15)
CHLORIDE: 105 mmol/L (ref 101–111)
CO2: 26 mmol/L (ref 22–32)
CREATININE: 0.8 mg/dL (ref 0.61–1.24)
Calcium: 8.5 mg/dL — ABNORMAL LOW (ref 8.9–10.3)
GFR calc Af Amer: >60 mL/min (ref >60)
GFR calc non Af Amer: >60 mL/min (ref >60)
GLUCOSE: 119 mg/dL — AB (ref 65–99)
Potassium: 3.9 mmol/L (ref 3.5–5.1)
SODIUM: 136 mmol/L (ref 135–145)

## 2016-03-16 LAB — CBC
HCT: 27.3 % — ABNORMAL LOW (ref 39.0–52.0)
HEMOGLOBIN: 8.5 g/dL — AB (ref 13.0–17.0)
MCH: 28.3 pg (ref 26.0–34.0)
MCHC: 31.1 g/dL (ref 30.0–36.0)
MCV: 91 fL (ref 78.0–100.0)
Platelets: 525 10*3/uL — ABNORMAL HIGH (ref 150–400)
RBC: 3 MIL/uL — ABNORMAL LOW (ref 4.22–5.81)
RDW: 18.6 % — ABNORMAL HIGH (ref 11.5–15.5)
WBC: 6.6 10*3/uL (ref 4.0–10.5)

## 2016-03-16 LAB — PROTIME-INR
INR: 1.95 — ABNORMAL HIGH (ref 0.00–1.49)
PROTHROMBIN TIME: 22.1 s — AB (ref 11.6–15.2)

## 2016-03-16 MED ORDER — CLOPIDOGREL BISULFATE 75 MG PO TABS: 75.0000 mg | ORAL_TABLET | Freq: Every day | ORAL | Status: DC

## 2016-03-16 MED ORDER — ATORVASTATIN CALCIUM 80 MG PO TABS: 80.0000 mg | ORAL_TABLET | Freq: Every day | ORAL | Status: DC

## 2016-03-16 MED ORDER — ASPIRIN 81 MG PO TBEC: 81.0000 mg | DELAYED_RELEASE_TABLET | Freq: Every day | ORAL | Status: DC

## 2016-03-16 MED ORDER — INSULIN GLARGINE 100 UNIT/ML ~~LOC~~ SOLN: 20.0000 [IU] | Freq: Every day | SUBCUTANEOUS | Status: DC

## 2016-03-16 MED ORDER — WARFARIN SODIUM 7.5 MG PO TABS
7.5000 mg | ORAL_TABLET | Freq: Once | ORAL | Status: AC
  Administered 2016-03-16: 7.5 mg via ORAL
  Filled 2016-03-16: qty 1

## 2016-03-16 MED ORDER — WARFARIN SODIUM 5 MG PO TABS: ORAL_TABLET | ORAL | Status: DC

## 2016-03-16 MED ORDER — OXYCODONE-ACETAMINOPHEN 5-325 MG PO TABS: 1.0000 | ORAL_TABLET | Freq: Four times a day (QID) | ORAL | Status: DC | PRN

## 2016-03-16 MED ORDER — CARVEDILOL 6.25 MG PO TABS: 6.2500 mg | ORAL_TABLET | Freq: Two times a day (BID) | ORAL | Status: DC

## 2016-03-16 MED FILL — LANTUS 100 UNITS/ML VIAL: 100 | 34 days supply | Qty: 10 | Fill #0

## 2016-03-16 MED FILL — CARVEDILOL 6.25 MG TABLET: 6.25 | 30 days supply | Qty: 60 | Fill #0

## 2016-03-16 MED FILL — ULTICARE SYR 0.3 ML 30GX5/1: 30G X 5/16" | 30 days supply | Qty: 30 | Fill #0

## 2016-03-16 MED FILL — OXYCODONE/APAP 5-325: 5-325 | 2 days supply | Qty: 10 | Fill #0

## 2016-03-16 MED FILL — CLOPIDOGREL 75 MG TABLET: 75 | 30 days supply | Qty: 30 | Fill #0

## 2016-03-16 MED FILL — ATORVASTATIN 80 MG TABLET: 80 | 30 days supply | Qty: 30 | Fill #0

## 2016-03-16 MED FILL — WARFARIN SODIUM 5 MG TABLET: 5 | 28 days supply | Qty: 30 | Fill #0

## 2016-03-16 NOTE — Progress Notes (Signed)
ANTICOAGULATION CONSULT NOTE  Pharmacy Consult for warfarin Indication:  LV thrombus and R femoral embolism s/p embolectomy and now s/p cath with stent placement  No Known Allergies  Patient Measurements: Height: 5\' 8"  (172.7 cm) Weight: 191 lb 1.6 oz (86.682 kg) IBW/kg (Calculated) : 68.4  Heparin dosing wt: 87 kg  Labs:  Recent Labs  03/14/16 1041 03/15/16 0845 03/16/16 0856  HGB 8.2* 8.6* 8.5*  HCT 25.8* 26.7* 27.3*  PLT 477* 465* 525*  LABPROT 23.4*  --  22.1*  INR 2.09*  --  1.95*  HEPARINUNFRC 0.24*  --   --   CREATININE  --  0.75 0.80   Estimated Creatinine Clearance: 110.4 mL/min (by C-G formula based on Cr of 0.8).  Assessment: 55 yo male with LV thrombus s/p R femoral embolectomy and subsequent hematoma evacuation. His heparin was turned off due to complications of bleeding but resumed on 7/8. Noted plans for home with health nurse.  -INR= 1.95   Goal of Therapy:  INR 2-3 Monitor platelets by anticoagulation protocol: Yes   Plan:  -Coumadin 7.5mg  po tonight -At discharge would consider coumadin 5mg  po daily except 7.5mg  on MWF -Daily INR if her remains here  Harland German, Pharm D 03/16/2016 10:35 AM

## 2016-03-16 NOTE — Progress Notes (Signed)
Pt. Refuses to get discharge until speaks with Early.

## 2016-03-16 NOTE — Progress Notes (Addendum)
Physical Therapy Treatment Patient Details Name: Troy Yu MRN: [id number] DOB: 25-Aug-1961 Today's Date: 03/16/2016    History of Present Illness 55 y.o. male with no significant hx of cad/pvd/angina/claudication. 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 adm with complaints of red right leg pain; + NSTEMI subacute; EF 30-35%; +left ventricle thrombus; +Rt femoral embolism, 6/25 femoral embolectomy and four compartment calf fasciotomies; 6/26 Evacuation of hematoma from bilateral right calf fasciotomies with placement of negative pressure dressings x 2 with plan for partial wound closeure with VAC replacement on 02/26/16.  On 02/08/16 pt underwent successful angioplasty and drug-eluting stent placement to the distal right coronary artery and mid left anterior descending artery.    PT Comments    Pt admitted with above diagnosis. Pt currently with functional limitations due to balance and endurance deficits. Pt was able to ambulate with overall good stability. Son can assist pt at home. PRogressing.  CM lining up equipment for home.   Pt will benefit from skilled PT to increase their independence and safety with mobility to allow discharge to the venue listed below.    Follow Up Recommendations  Home health PT;Supervision/Assistance - 24 hour (HHOt and HHRN)     Equipment Recommendations  Rolling walker with 5" wheels;3in1 (PT);Wheelchair (measurements PT);Wheelchair cushion (measurements PT)    Recommendations for Other Services       Precautions / Restrictions Precautions Precautions: Fall Restrictions Weight Bearing Restrictions: No    Mobility  Bed Mobility Overal bed mobility: Needs Assistance Bed Mobility: Supine to Sit     Supine to sit: Supervision     General bed mobility comments: No assist needed.  Did place pts socks on per his request.   Transfers Overall  transfer level: Needs assistance Equipment used: Rolling walker (2 wheeled) Transfers: Sit to/from Stand Sit to Stand: Min guard         General transfer comment: Min guard assist for safety and to balance. Max VCs for safe hand placement and proper RW use as he forgets to reach back for chair.    Ambulation/Gait Ambulation/Gait assistance: Min guard;Min assist Ambulation Distance (Feet): 20 Feet Assistive device: Rolling walker (2 wheeled) Gait Pattern/deviations: Step-to pattern (hops on left LE) Gait velocity: decreased Gait velocity interpretation: Below normal speed for age/gender General Gait Details: Pt hopping on Lt LE refusing to place Rt foot on floor.  Steady gait with RW.  No LOB with RW and better safety awareness with walking.  Asked pt if he wanted to practice up and down steps and pt declines.    Stairs            Wheelchair Mobility    Modified Rankin (Stroke Patients Only)       Balance Overall balance assessment: Needs assistance;History of Falls Sitting-balance support: No upper extremity supported;Feet supported Sitting balance-Leahy Scale: Good     Standing balance support: Bilateral upper extremity supported;During functional activity Standing balance-Leahy Scale: Poor Standing balance comment: relies on RW for balance but is steady with RW.                    Cognition Arousal/Alertness: Awake/alert Behavior During Therapy: WFL for tasks assessed/performed Overall Cognitive Status: Impaired/Different from baseline           Safety/Judgement: Decreased awareness of safety Awareness: Emergent Problem Solving: Slow processing;Requires verbal cues  Exercises      General Comments General comments (skin integrity, edema, etc.): Son present and they feel that they will be able to manage at home with RW.  son to provide prn assist.      Pertinent Vitals/Pain Pain Assessment: Faces Faces Pain Scale: Hurts little more Pain  Location: right LE Pain Descriptors / Indicators: Aching;Operative site guarding Pain Intervention(s): Limited activity within patient's tolerance;Monitored during session;Repositioned  VSS    Home Living                      Prior Function            PT Goals (current goals can now be found in the care plan section) Progress towards PT goals: Progressing toward goals    Frequency  Min 3X/week    PT Plan Discharge plan needs to be updated    Co-evaluation             End of Session Equipment Utilized During Treatment: Gait belt Activity Tolerance: Patient limited by fatigue Patient left: in chair;with call bell/phone within reach;with chair alarm set;with family/visitor present     Time: 3005-1102 PT Time Calculation (min) (ACUTE ONLY): 25 min  Charges:  $Gait Training: 8-22 mins $Self Care/Home Management: 8-22                    G CodesBerline Lopes 04/05/2016, 2:18 PM Agusta Hackenberg,PT Acute Rehabilitation [phone] (pager)

## 2016-03-16 NOTE — Discharge Summary (Signed)
Physician Discharge Summary  Troy Yu ZOX:[id number] DOB: 07/05/1961 DOA: 02/21/2016  PCP: No primary care provider on file.  Admit date: 02/21/2016 Discharge date: 03/16/2016  Time spent: 35 minutes  Recommendations for Outpatient Follow-up:  1. Prior to discharge he was set up with home health services for physical therapy, occupational therapy and are an. He has a complex wound involving right lower extremity and will require wound care services 2. Please follow-up on PT/INR, on warfarin therapy for LV thrombus, on 03/16/2016 had a 9 or 1.95 with PT of 22.1. He was discharged with home health services, recommend checking PT/INR on Friday, 03/18/2016. Coumadin levels to be monitored by cardiology.  3. He was newly diagnosed with diabetes mellitus and will require follow-up on his blood sugars, discharged on Lantus 20 units subcutaneous daily. 4. He was discharged on aspirin and Plavix given recent cardiac stenting. Cardiology recommending stopping aspirin after 30 days and continuing with Plavix.    Discharge Diagnoses:  Principal Problem:   LV (left ventricular) mural thrombus (HCC) Active Problems:   Anterior subendocardial MI (HCC)   Right leg pain   Non-STEMI (non-ST elevated myocardial infarction) (HCC)   Elevated troponin   Abnormal EKG   Diabetes (HCC)   Acute MI (HCC)   History of embolectomy   Pain   Thromboembolism (HCC)   Vascular occlusion   Fever   NSTEMI (non-ST elevated myocardial infarction) (HCC)   Chest pain   Abdominal pain   Acute combined systolic and diastolic heart failure (HCC)   Abnormal CXR   Status post coronary artery stent placement   Discharge Condition: Stable  Diet recommendation: Heart healthy/carbohydrate modified diet  Filed Weights   03/12/16 0634 03/15/16 0509 03/16/16 0456  Weight: 91.309 kg (201 lb 4.8 oz) 86.818 kg (191 lb 6.4 oz) 86.682 kg (191 lb 1.6 oz)    History of present illness:  Troy Yu is a 55 y.o. male  with medical history significant for recent chest pain presents to [facility] from [facility] with cc right leg pain and recent chest pain. Initial evaluation includes evaluation by cardiology who opined likely large MI weeks ago with now LV thrombus with emboli. He's also been evaluated by vascular surgery who plans taken to the operating room today.  Information is obtained from the patient and the chart. He reports developing right leg pain yesterday. Associated symptoms include numbness and tingling of his toes. He reports he didn't think much of it initially because he's had problems with the leg since he was a child that worsened a little bit after an injury in 2014. Yesterday he was doing some heavy lifting when the pain began. He reports it being continuous for 2 hours radiating from his right lateral knee down to his foot. He rates the pain a 10 out of 10. He then developed coolness. He took ibuprofen without improvement. He denies any chest pain palpitations headache dizziness syncope or near-syncope. He denies any abdominal pain nausea vomiting diarrhea constipation. He denies dysuria hematuria frequency or urgency. He denies cough shortness of breath fever chills or sick contacts. He denies any recent travel.  Of note patient reports approximately 2 weeks ago while on a during fasting time he developed left-sided chest pressure. Associated symptoms at that time included nausea and vomiting. He attributed to being in the hot signed in the setting of fasting. He reports the pain lasted for 3 days. He says he broke his fast and vomited shortly thereafter. The pain resolved and  has not reoccurred. At that time he denied any shortness of breath.  Hospital Course:  [name] is a 5M with diabetes here with subacute anterior MI complicated by LV thrombus and right femoral artery embolism now s/p embolectomy and R calf fasciotomy for compartment syndrome. Underwent PCI. This was complicated by post  operative anemia and has received several units of PRBCs. Heparin discontinued as INR is therapeutic on coumadin. Patient refusing plastic surgery intervention.During this hospitalization he has also refused to work with physical therapy and refuses SNF. He has also knots allowed nursing staff to change dressings. Wound was evaluated by Troy. Arbie Cookey of vascular surgery on 03/15/2016, reporting "continues to have the demarcation of full-thickness loss over the lateral area of the skin closure. No infection with eschar present. Medial wound with good granulating base and minimal debris present. " Troy Arbie Cookey had recommended on multiple occasions plastic surgery consultation for evaluation of split thickness skin graft which he has continued to decline. He reported to Troy. Arbie Cookey not wanting anyone "cutting on his body anymore." Troy Arbie Cookey felt that he could eventually close wound but would take months. On 03/16/2016 having declined further evaluation by plastic surgery Troy Yu felt that he was stable for discharge from a vascular standpoint. He was set up with home health services for physical therapy, occupational therapy and nursing.   LV (left ventricular) mural thrombus (HCC) with embolization of right femoral artery resulting in limb ischemia s/p thrombectomy and fasciotomy with post operative course complicated by hematoma and bleeding s/p evacuation of hematoma with application of a VAC in the setting of therapeutic anti-coagulation -Case taken over by Troy. Arbie Cookey after patient requested a second opinion.  -s/p closure of lateral fasciotomy site and placement of VAC on medial fasciotomy site -Plastic surgery consulted by vascular for possible STSG to medial fasciotomy site, but patient refusing any further surgical procedures.  -Wound vac removed (medial) and Daily hydrogel application and wet-to-dry dressings - 7/4 leg wound continues to ooze blood and dressing changed several times and Troy Yu contacted and  IV heparin was discontinued.  - 7/6 his hemoglobin dropped to 5 and he received 3 units of prbc transfusion. Repeat hemoglobin improved .  - 7/8 heparin restarted as his hemoglobin is stable and oozing from the wound has decreased.  -7/10 no changes in management.  -Coumadin started 7/12. Will discontinue heparin -Troy Catha Gosselin spoke with Troy. Arbie Cookey (together in pateint's room)- continue dressing changes. Explained to patient that it will take months for wound to close. Patient may be discharged when INR is therapeutic.  -PT has tried to work with patient several times and he has refused -Patient does not want to go to SNF -He was discharged on 03/16/2016 with home health services -Please follow-up on PT/INR on 7 08/30/2015, had INR 1.95 and PTT of 22.1 on 03/16/2016. He was discharged on warfarin 7.5 mg on Mondays Wednesdays and Fridays with 5 mg on the other days of the week as recommended by pharmacy.   Symptomatic anemia/ Postoperative anemia/ Anemia secondary to blood loss -During hospitalization, hemoglobin has dropped to as low as 4.7 , continues to drop , requiring many prbc transfusions.  -Has received a total of 11 u PRBCs this admission -On 03/16/2016 had hemoglobin of 8.5 with hematocrit of 27.3  Syncope and orthostasis -Likely secondary to blood loss -Continue to monitor closely  Anterior subendocardial MI (HCC)/Abnormal EKG/Elevated troponin -On therapeutic dose heparin, which was held on 7/5 for continuous oozing from the leg  wound. Restarted IV heparin on 7/8 as his oozing from the leg wound decreased and his hemoglobin is stable.  -Cardiology consulted and appreciated -ACE-I to be started when BP able to tolerate -s/p DES to distal RCA and mid LAD -Started on plavix, aspririn, coumadin (discontinue aspirin after 1 month)  Newly diagnosed diabetes mellitus/Hyperglycemia -Hemoglobin A1c 9.2.  -Discharged on Lantus 20 units subcutaneous  daily  Hyponatremia -Serum osm 273 , urine osmo, TSH and am cortisol normal limits. .  -Continue to monitor BMP intermittently.  Abdominal distention, and constipation: -patientreported abdominal pain and feeling bloated. AXR shows air filled small bowels, probably from small bowel dysmotility. Recommended he get out of bed and ambulate which will help relieve the gas, and help with constipation. -Improved with enema  Fevers  -on and OFF associated with chills, none today.  -Initially thought to be from blood transfusions -Septic work up including: Blood cultures ordered, CXR, UA, and CT abd and pelvis without contrast ordered for further evaluation. CT abd shows cholelithiasis with fingings concerning for Yu acute cholecystitis.  -US abdomen ordered, which showed mildly dilated gall bladder, with a 9 mm gall bladder stone impacted in the necko f the gall bladder, no pericholecystic fluid no wall thickening. Negative murphy's sign. He is currently asymptomatic. Monitor. No further intervention needed at this time.   Procedures: Right femoral embolectomy Right calf four compartment fasciotomies  Evacuation of hematoma from bilateral right calf fasciotomies Placement of negative pressure dressings x2 Closure of lateral fasciotomy site and placement of VAC on medial fasciotomy site Cardiac catheterization, DES to distal RCA and Mid LAD  Consultations: Cardiology  Vascular Surgery Plastic surgery  Discharge Exam: Filed Vitals:   03/16/16 0456 03/16/16 0818  BP: 95/58 106/65  Pulse: 79 79  Temp: 98.1 F (36.7 C)   Resp: 20      General: Well developed, well nourished, no distress  HEENT: NCAT, mucous membranes moist.   Cardiovascular: S1 S2 auscultated, RRR, no murmurs  Extremities: warm dry without cyanosis clubbing or edema. RLE wrapped, dressing clean  Neuro: AAOx3, nonfocal  Psych: Appropriate mood and affect, pleasant  Discharge  Instructions   Discharge Instructions    AMB Referral to Cardiac Rehabilitation - Phase II    Complete by:  As directed   Diagnosis:   PTCA NSTEMI       Amb Referral to Cardiac Rehabilitation    Complete by:  As directed   Diagnosis:   NSTEMI Coronary Stents       Call MD for:  difficulty breathing, headache or visual disturbances    Complete by:  As directed      Call MD for:  difficulty breathing, headache or visual disturbances    Complete by:  As directed      Call MD for:  extreme fatigue    Complete by:  As directed      Call MD for:  extreme fatigue    Complete by:  As directed      Call MD for:  hives    Complete by:  As directed      Call MD for:  hives    Complete by:  As directed      Call MD for:  persistant dizziness or light-headedness    Complete by:  As directed      Call MD for:  persistant dizziness or light-headedness    Complete by:  As directed      Call MD for:  persistant nausea and  vomiting    Complete by:  As directed      Call MD for:  persistant nausea and vomiting    Complete by:  As directed      Call MD for:  redness, tenderness, or signs of infection (pain, swelling, redness, odor or green/yellow discharge around incision site)    Complete by:  As directed      Call MD for:  redness, tenderness, or signs of infection (pain, swelling, redness, odor or green/yellow discharge around incision site)    Complete by:  As directed      Call MD for:  severe uncontrolled pain    Complete by:  As directed      Call MD for:  severe uncontrolled pain    Complete by:  As directed      Call MD for:  temperature >100.4    Complete by:  As directed      Call MD for:  temperature >100.4    Complete by:  As directed      Call MD for:    Complete by:  As directed      Call MD for:    Complete by:  As directed      Diet - low sodium heart healthy    Complete by:  As directed      Diet - low sodium heart healthy    Complete by:  As directed      Increase  activity slowly    Complete by:  As directed      Increase activity slowly    Complete by:  As directed           Current Discharge Medication List    START taking these medications   Details  aspirin EC 81 MG EC tablet Take 1 tablet (81 mg total) by mouth daily. Qty: 30 tablet, Refills: 1    atorvastatin (LIPITOR) 80 MG tablet Take 1 tablet (80 mg total) by mouth daily at 6 PM. Qty: 30 tablet, Refills: 1    carvedilol (COREG) 6.25 MG tablet Take 1 tablet (6.25 mg total) by mouth 2 (two) times daily with a meal. Qty: 60 tablet, Refills: 1    clopidogrel (PLAVIX) 75 MG tablet Take 1 tablet (75 mg total) by mouth daily with breakfast. Qty: 30 tablet, Refills: 1    insulin glargine (LANTUS) 100 UNIT/ML injection Inject 0.2 mLs (20 Units total) into the skin at bedtime. Qty: 10 mL, Refills: 11    oxyCODONE-acetaminophen (PERCOCET/ROXICET) 5-325 MG tablet Take 1-2 tablets by mouth every 6 (six) hours as needed for moderate pain. Qty: 10 tablet, Refills: 0    warfarin (COUMADIN) 5 MG tablet Take 7.5 mg PO on M, W, F, then 5 mg on the other days QTY sufficient Qty: 30 tablet, Refills: 3      CONTINUE these medications which have NOT CHANGED   Details  ibuprofen (ADVIL,MOTRIN) 200 MG tablet Take 400 mg by mouth every 6 (six) hours as needed (for pain.).       No Known Allergies Follow-up Information    Follow up with Norma Fredrickson, NP On 03/21/2016.   Specialties:  Nurse Practitioner, Interventional Cardiology, Cardiology, Radiology   Why:  at 9:00 am for cardiology follow up   Contact information:   [address] [place] [address] [phone]       Follow up with Gretta Began, MD In 2 weeks.   Specialties:  Vascular Surgery, Cardiology   Why:  Our office will call you to arrange an appointment    Contact information:   [address] [place] [address] [phone]        The results of significant diagnostics from this hospitalization  (including imaging, microbiology, ancillary and laboratory) are listed below for reference.    Significant Diagnostic Studies: Dg Chest 2 View  03/06/2016  CLINICAL DATA:  Fever EXAM: CHEST  2 VIEW COMPARISON:  February 26, 2016 FINDINGS: There is slight atelectasis in the left lower lobe. The lungs elsewhere are clear. Heart is upper normal in size with pulmonary vascularity within normal limits. There is prominence of the mediastinum at the level of the aortic arch. No adenopathy. No bone lesions. IMPRESSION: Mild left base atelectasis. No edema or consolidation. Heart is upper normal in size. Note that there is prominence of the mediastinum at the level of the aortic arch. The significance of this finding is uncertain. This finding may warrant contrast enhanced chest CT to evaluate the aorta in this region. These results will be called to the ordering clinician or representative by the Radiologist Assistant, and communication documented in the PACS or zVision Dashboard. Electronically Signed   By: Bretta Bang III M.D.   On: 03/06/2016 14:26   Dg Chest 2 View  02/21/2016  CLINICAL DATA:  Left lower extremity numbness and pain. EXAM: CHEST  2 VIEW COMPARISON:  None. FINDINGS: Normal heart size. Low lung volumes. Top-normal heart size. Mediastinal contour is within normal limits. No pneumothorax. No pleural effusion. Lungs appear clear, with no acute consolidative airspace disease and no pulmonary edema. IMPRESSION: Low lung volumes.  No active disease in the chest. Electronically Signed   By: Delbert Phenix M.D.   On: 02/21/2016 11:27   Ct Chest W Contrast  03/06/2016  CLINICAL DATA:  55 year old male with history of right-sided abdominal pain. Substernal chest pain 2 weeks ago lasting for 48 hours. EXAM: CT CHEST, ABDOMEN, AND PELVIS WITH CONTRAST TECHNIQUE: Multidetector CT imaging of the chest, abdomen and pelvis was performed following the standard protocol during bolus administration of intravenous  contrast. CONTRAST:  100 mL of Isovue-300. COMPARISON:  No priors. FINDINGS: CT CHEST FINDINGS Mediastinum/Lymph Nodes: Heart size is enlarged. There is no significant pericardial fluid, thickening or pericardial calcification. 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 Image 29 of series 2 demonstrates a small 7 mm filling defect in the apex of the left ventricle, compatible with apical thrombus. There is no significant pericardial fluid, thickening or pericardial calcification. No pathologically enlarged mediastinal or hilar lymph nodes. No aneurysm or dissection of the thoracic aorta. Esophagus is unremarkable in appearance. No axillary lymphadenopathy. Lungs/Pleura: Linear areas of scarring and/or subsegmental atelectasis are noted in the lower lobes of the lungs bilaterally. Trace bilateral pleural effusions. No confluent consolidative airspace disease. No definite suspicious appearing pulmonary nodules or masses are noted (assessment is limited by considerable patient respiratory motion). Musculoskeletal/Soft Tissues: There are no aggressive appearing lytic or blastic lesions noted in the visualized portions of the skeleton. CT ABDOMEN AND PELVIS FINDINGS Hepatobiliary: No cystic or solid hepatic lesions. No intra or extrahepatic biliary ductal dilatation. Small calcified gallstones are noted dependently in the gallbladder, measuring up to 7 mm in diameter. Gallbladder wall appears mildly thickened, and there are subtle pericholecystic inflammatory changes. Gallbladder is moderately distended. Pancreas: No pancreatic mass. No pancreatic ductal  dilatation. No pancreatic or peripancreatic fluid or inflammatory changes. Spleen: Unremarkable. Adrenals/Urinary Tract: Bilateral adrenal glands and  bilateral kidneys are normal in appearance. No hydroureteronephrosis. Urinary bladder is normal in appearance. Stomach/Bowel: The appearance of the stomach is normal. There is no pathologic dilatation of small bowel or colon. Normal appendix. Vascular/Lymphatic: No significant atherosclerotic disease, aneurysm or dissection identified in the abdominal or pelvic vasculature. There is some intermediate to high attenuation fluid adjacent to the right common femoral vessels, presumably a postprocedural hematoma from recent catheterization procedure. No findings to suggest acute active extravasation at this time. No lymphadenopathy noted in the abdomen or pelvis. Reproductive: Prostate gland and seminal vesicles are unremarkable in appearance. Other: No significant volume of ascites.  No pneumoperitoneum. Musculoskeletal: There are no aggressive appearing lytic or blastic lesions noted in the visualized portions of the skeleton. IMPRESSION: 1. Cholelithiasis with findings concerning for potential Yu acute cholecystitis. This should be further evaluated with right upper quadrant abdominal ultrasound if there is clinical concern for acute cholecystitis. 2. The patient continues to have a small volume of apical thrombus in the left ventricle. 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 3. Areas of scarring and/or subsegmental atelectasis in the lower lobes of the lungs bilaterally. 4. Trace bilateral pleural effusions lying dependently. 5. Resolving postprocedural hematoma adjacent to the right common femoral vessels, likely from prior catheterization procedure. No findings to suggest acute active extravasation at this time. 6. Additional incidental findings, as above. These results will be called to the ordering clinician or representative by the Radiologist Assistant, and communication documented in the  PACS or zVision Dashboard. Electronically Signed   By: Trudie Reed M.D.   On: 03/06/2016 18:20   US Abdomen Complete  03/08/2016  CLINICAL DATA:  No gallstones, 3 days of abdominal pain, elevated white blood cell count. EXAM: ABDOMEN ULTRASOUND COMPLETE COMPARISON:  CT scan of the abdomen pelvis of March 06, 2016 FINDINGS: Gallbladder: The gallbladder is adequately distended. In the gallbladder neck there is a 9 mm echogenic shadowing focus consistent with an impacted stone. There is sludge present. There is no gallbladder wall thickening or pericholecystic fluid. Common bile duct: Diameter: 4.9 mm Liver: The hepatic echotexture is mildly increased diffusely. There is no discrete mass or intrahepatic ductal dilation. IVC: No abnormality visualized. Pancreas: Bowel gas obscured the pancreas. Spleen: Size and appearance within normal limits. Right Kidney: Length: 13 cm. Echogenicity within normal limits. No mass or hydronephrosis visualized. Left Kidney: Length: 13.2 cm. Echogenicity within normal limits. No mass or hydronephrosis visualized. Abdominal aorta: Bowel gas limited evaluation of the abdominal aorta. The maximal diameter observed is 2.5 cm proximally Other findings: No ascites is observed. There is a left pleural effusion. IMPRESSION: 1. Mildly distended gallbladder with a 9 mm stone impacted in the gallbladder neck. No significant gallbladder wall thickening and no pericholecystic fluid is observed. There is no positive sonographic Murphy's sign. 2. Fatty infiltrative change of the liver. 3. No acute abnormality observed elsewhere within the abdomen. 4. Left pleural effusion. Electronically Signed   By: David  [place] M.D.   On: 03/08/2016 07:07   Ct Abdomen Pelvis W Contrast  03/06/2016  CLINICAL DATA:  55 year old male with history of right-sided abdominal pain. Substernal chest pain 2 weeks ago lasting for 48 hours. EXAM: CT CHEST, ABDOMEN, AND PELVIS WITH CONTRAST TECHNIQUE: Multidetector CT  imaging of the chest, abdomen and pelvis was  performed following the standard protocol during bolus administration of intravenous contrast. CONTRAST:  100 mL of Isovue-300. COMPARISON:  No priors. FINDINGS: CT CHEST FINDINGS Mediastinum/Lymph Nodes: Heart size is enlarged. There is no significant pericardial fluid, thickening or pericardial calcification. 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 Image 29 of series 2 demonstrates a small 7 mm filling defect in the apex of the left ventricle, compatible with apical thrombus. There is no significant pericardial fluid, thickening or pericardial calcification. No pathologically enlarged mediastinal or hilar lymph nodes. No aneurysm or dissection of the thoracic aorta. Esophagus is unremarkable in appearance. No axillary lymphadenopathy. Lungs/Pleura: Linear areas of scarring and/or subsegmental atelectasis are noted in the lower lobes of the lungs bilaterally. Trace bilateral pleural effusions. No confluent consolidative airspace disease. No definite suspicious appearing pulmonary nodules or masses are noted (assessment is limited by considerable patient respiratory motion). Musculoskeletal/Soft Tissues: There are no aggressive appearing lytic or blastic lesions noted in the visualized portions of the skeleton. CT ABDOMEN AND PELVIS FINDINGS Hepatobiliary: No cystic or solid hepatic lesions. No intra or extrahepatic biliary ductal dilatation. Small calcified gallstones are noted dependently in the gallbladder, measuring up to 7 mm in diameter. Gallbladder wall appears mildly thickened, and there are subtle pericholecystic inflammatory changes. Gallbladder is moderately distended. Pancreas: No pancreatic mass. No pancreatic ductal dilatation. No pancreatic or  peripancreatic fluid or inflammatory changes. Spleen: Unremarkable. Adrenals/Urinary Tract: Bilateral adrenal glands and bilateral kidneys are normal in appearance. No hydroureteronephrosis. Urinary bladder is normal in appearance. Stomach/Bowel: The appearance of the stomach is normal. There is no pathologic dilatation of small bowel or colon. Normal appendix. Vascular/Lymphatic: No significant atherosclerotic disease, aneurysm or dissection identified in the abdominal or pelvic vasculature. There is some intermediate to high attenuation fluid adjacent to the right common femoral vessels, presumably a postprocedural hematoma from recent catheterization procedure. No findings to suggest acute active extravasation at this time. No lymphadenopathy noted in the abdomen or pelvis. Reproductive: Prostate gland and seminal vesicles are unremarkable in appearance. Other: No significant volume of ascites.  No pneumoperitoneum. Musculoskeletal: There are no aggressive appearing lytic or blastic lesions noted in the visualized portions of the skeleton. IMPRESSION: 1. Cholelithiasis with findings concerning for potential Yu acute cholecystitis. This should be further evaluated with right upper quadrant abdominal ultrasound if there is clinical concern for acute cholecystitis. 2. The patient continues to have a small volume of apical thrombus in the left ventricle. 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 3. Areas of scarring and/or subsegmental atelectasis in the lower lobes of the lungs bilaterally. 4. Trace bilateral pleural effusions lying dependently. 5. Resolving postprocedural hematoma adjacent to the right common femoral vessels, likely from prior catheterization procedure. No findings to suggest acute active extravasation at this time. 6. Additional incidental findings, as above. These results will  be called to the ordering clinician or representative by the Radiologist Assistant, and communication documented in the PACS or zVision Dashboard. Electronically Signed   By: Trudie Reed M.D.   On: 03/06/2016 18:20   Ct Angio Ao+bifem W &/or Wo Contrast  02/21/2016  CLINICAL DATA:  Right lower extremity pain with diminished pulses and numbness. EXAM: CT ANGIOGRAPHY OF ABDOMINAL AORTA WITH ILIOFEMORAL RUNOFF TECHNIQUE: Multidetector CT imaging of the abdomen,  pelvis and lower extremities was performed using the standard protocol during bolus administration of intravenous contrast. Multiplanar CT image reconstructions and MIPs were obtained to evaluate the vascular anatomy. CONTRAST:  100 mL Isovue 370 IV COMPARISON:  None. FINDINGS: Aorta: Normally patent abdominal aorta without evidence of atherosclerosis, aneurysm or dissection. Visceral arteries are widely patent including the celiac axis, superior mesenteric artery, inferior mesenteric artery and bilateral single renal arteries. No embolic occlusions identified in the abdomen or pelvis. Right Lower Extremity: Widely patent right common, external and internal iliac arteries. The common femoral artery is normally patent up to its bifurcation. At the femoral bifurcation, there is nearly occlusive thrombus identified extending into both proximal profunda femoral and superficial femoral artery trunks. Contrast flow is seen beyond the thrombus and into normal distal profunda femoral branches. The SFA is open, but demonstrates some anterior mural thrombus. In the absence of significant atherosclerosis, this may represent some adherent thrombus from recent/prior embolic event. Anterior mural thrombus continues into the popliteal artery above the knee. Below the knee, there is only faint opacification of the distal popliteal artery and no visualized opacification of the tibial arteries. This is likely due to slow flow. However, component of thrombus in the tibial  arteries cannot be entirely excluded. Left Lower Extremity: Left-sided arterial supply is completely normal including the common femoral artery, profunda femoral artery, superficial femoral artery, popliteal artery and tibial arteries. Or there is some narrowing of the popliteal artery just above the knee joint of approximately 40-50% is associated with a slightly tortuous/ course. No plaque is identified at this level. A component of incidental popliteal entrapment cannot be excluded. Review of the MIP images confirms the above findings. Lower chest:  Bibasilar atelectasis.  No pleural effusions. Hepatobiliary: Unremarkable arterial phase imaging of the liver and gallbladder. Pancreas: Unremarkable appearance. Spleen: Normal appearance of spleen. Adrenals/Urinary Tract: Normal adrenal glands and kidneys. No renal infarcts identified. Stomach/Bowel: Normal appearance without evidence to suggest mesenteric ischemia or inflammation. No obstruction. Normal appendix. No free air, free fluid or abscess. Lymphatic: No enlarged lymph nodes identified. Reproductive: Negative. Other: No hernias. Musculoskeletal: Bony structures are normal. IMPRESSION: 1. Embolic thrombus at the bifurcation of the right common femoral artery with thrombus extending into proximal profunda femoral and superficial femoral arteries. Thrombus is nearly occlusive but contrast is seen to flow around the thrombus into the distal vessels. There is also a component of probable anterior mural thrombus in the SFA and popliteal arteries which may be consistent with additional adherent thrombus. Tibial arteries are not adequately opacified on the right, most likely secondary to slow flow. Distal thrombus cannot be excluded. Vascular Surgical consultation is recommended. 2. Incidental finding of a mildly tortuous course of the popliteal artery on the left associated with some narrowing just above the knee joint. This narrowing does not appear  atherosclerotic and there may be an incidental component of popliteal entrapment syndrome on the left. These results were called by telephone at the time of interpretation on 02/21/2016 at 2:55 pm to Troy. Zadie Rhine , who verbally acknowledged these results. Electronically Signed   By: Irish Lack M.D.   On: 02/21/2016 14:56   Dg Chest Port 1 View  02/26/2016  CLINICAL DATA:  Fever, onset tonight. EXAM: PORTABLE CHEST 1 VIEW COMPARISON:  None. FINDINGS: A single AP portable view of the chest demonstrates no focal airspace consolidation or alveolar edema. The lungs are grossly clear. There is no large effusion or pneumothorax. Cardiac and mediastinal contours appear  unremarkable. IMPRESSION: No acute findings Electronically Signed   By: Ellery Plunk M.D.   On: 02/26/2016 02:15   Dg Abd Portable 1v  03/05/2016  CLINICAL DATA:  Acute onset of epigastric and right lower quadrant abdominal pain. Initial encounter. EXAM: PORTABLE ABDOMEN - 1 VIEW COMPARISON:  None. FINDINGS: Scattered air-filled loops of small bowel measure up to 3.4 cm in diameter, though nondistended loops of small bowel are also seen. This likely reflects mild small bowel dysmotility. Residual stool is noted within the colon. No free intra-abdominal air is identified, though evaluation for free air is limited on supine views. The visualized osseous structures are within normal limits; the sacroiliac joints are unremarkable in appearance. The visualized lung bases are essentially clear. IMPRESSION: Air-filled small bowel loops measure up to 3.4 cm in diameter, though nondistended loops of small bowel are also seen. This likely reflects mild small bowel dysmotility. No evidence for bowel obstruction. No free intra-abdominal air seen. Electronically Signed   By: Roanna Raider M.D.   On: 03/05/2016 23:55    Microbiology: No results found for this or any previous visit (from the past 240 hour(s)).   Labs: Basic Metabolic  Panel:  Recent Labs Lab 03/10/16 0340 03/12/16 0619 03/13/16 0602 03/15/16 0845 03/16/16 0856  NA 133* 138 133* 136 136  K 3.5 3.6 3.7 3.8 3.9  CL 103 105 102 104 105  CO2 24 24 25 26 26   GLUCOSE 88 88 96 134* 119*  BUN <5* 5* 8 <5* <5*  CREATININE 0.85 0.79 0.75 0.75 0.80  CALCIUM 7.6* 8.1* 8.0* 8.5* 8.5*   Liver Function Tests: No results for input(s): AST, ALT, ALKPHOS, BILITOT, PROT, ALBUMIN in the last 168 hours. No results for input(s): LIPASE, AMYLASE in the last 168 hours. No results for input(s): AMMONIA in the last 168 hours. CBC:  Recent Labs Lab 03/12/16 0619 03/13/16 0602 03/14/16 1041 03/15/16 0845 03/16/16 0856  WBC 6.6 8.7 6.8 5.9 6.6  HGB 8.2* 7.8* 8.2* 8.6* 8.5*  HCT 25.3* 24.2* 25.8* 26.7* 27.3*  MCV 92.7 92.7 90.5 89.3 91.0  PLT 454* 470* 477* 465* 525*   Cardiac Enzymes: No results for input(s): CKTOTAL, CKMB, CKMBINDEX, TROPONINI in the last 168 hours. BNP: BNP (last 3 results) No results for input(s): BNP in the last 8760 hours.  ProBNP (last 3 results) No results for input(s): PROBNP in the last 8760 hours.  CBG:  Recent Labs Lab 03/15/16 1142 03/15/16 1628 03/15/16 2002 03/16/16 0615 03/16/16 1130  GLUCAP 131* 169* 195* 101* 165*       Signed:  Jeralyn Bennett MD.  [facility] 03/16/2016, 12:25 PM

## 2016-03-16 NOTE — Progress Notes (Signed)
PT note Treatment completed with full note to follow.  Pt was able to use RW and walk inroom with min guard assist.  Discussed going home with son and pt states maybe tomorrow. Will benefit from HHPT and HHOT as well as HHRN for wound care at d/c but unsure of payor source.  Pt will need RW and 3N1 as well.  Thanks.  [facility] [phone] (pager)

## 2016-03-16 NOTE — Clinical Social Work Note (Signed)
Patient is refusing SNF placement at this time. CSW signing off. CSW signing off. Consult if any other social work needs arise.  [employer], LCSW [phone]

## 2016-03-16 NOTE — Progress Notes (Signed)
Patient ID: Troy Yu, male   DOB: [birth year], 58 y.o.   MRN: [id number] Continued difficult management problem. Would not allow nursing staff to change the dressing yesterday. Did the dressing change this morning. Continues to have the demarcation of full-thickness loss over the lateral area of the skin closure. No infection with eschar present. Medial wound with good granulating base and minimal debris present.  Again recommended plastics evaluation for split thickness skin graft. He could eventually close this very large defect but explained would take months to do this. He had does not want to have plastics evaluation. Reports that he does not one in one cutting on his body anymore.  Okay for discharge with home health nurse from vascular standpoint. Continues to have 3+ dorsalis pedis pulse on the right

## 2016-03-16 NOTE — Progress Notes (Signed)
Patient alert and oriented x 4 and in no distress.  Patient given discharge instructions regarding signs and symptoms to report, medication, diet, activity, and upcoming appointments.  Patient verbalized understanding of all instructions. Telemetry and peripheral IV discontinued. Patient confirmed that he had all of his belongings.  Patient transported out via wheelchair by this RN.

## 2016-03-16 NOTE — Care Management Note (Addendum)
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  Pt Tx to 6c for chest pain post cath. Successful angioplasty and drug-eluting stent placement to the distal right coronary artery and mid left anterior descending artery. Plan to treat with Plavix.                    Action/Plan: CM will continue to monitor for disposition needs. CSW is following for possible SNF placement. Pt will need PT for recommendations. Pt is without insurance.   Expected Discharge Date:    03/16/16              Expected Discharge Plan:  Home w Home Health Services  In-House Referral:  NA  Discharge planning Services  CM Consult, Indigent Health Clinic, [facility], medication assistance  Post Acute Care Choice:  Home Health, Durable Medical Equipment Choice offered to:  Patient  DME Arranged:  3-N-1, Walker rolling, Wheelchair manual DME Agency:  [facility]  HH Arranged:  RN, PT, OT [facility] Agency:  [facility]  Status of Service:  Completed, signed off  If discussed at Long Length of Stay Meetings, dates discussed:    Additional Comments:  03/16/16- 1400- Donn Pierini RN, BSN- pt stable for d/c home today- orders have been placed for [facility] services- as pt is self pay will use [facility] for services for indigent - pt does not qualify for HHPT/OT however will receive HHRN for wound care- pt will also need RW and 3n1 for home and is requesting a w/c which the doctor has also placed order for. - spoke with pt at bedside- regarding HH services- he is agreeable to [facility] - as been explained that [facility] will not be coming daily- and  will come educate him and the family on drsg changes and wound care and do wound assessments.- Pt states that he does not feel like he is ready to go home- and wants someone to come talk to him again- have read MD notes from this AM to pt stating that they feel pt is medically stable for discharge and that there is nothing further to offer him acutely here in the hospital- will have the unit director speak with pt regarding discharge today. Referral has been made to [facility] with [facility] for [facility] needs (again pt does not have qualifying dx for PT/OT) and call also made to [facility] with [facility] for DME needs- RW, 3n1, and w/c to be delivered to room prior to discharge- pt's son is here and is able to transport pt home in private vehicle.  Update- 1600- bedside RN states pt concerned about cost of pain meds- spoke with pt and explained that meds are not given on discharge that he will have scripts to take to pharmacy for medications also explained that he would not be assisted with pain meds- will assist pt with MATCH for other medications- pt understands $3 copay per script for other medications with the exception of pain meds. MATCH letter given to pt along with list of pharmacies. Pt also given list of local clinics to call for f/u appointment- pt had stated that he had somewhere for primary care- but now  request list of clinics- pt to f/u with clinics for appointment when he feels able.    03/14/16- 1500- Donn Pierini RN, BSN-  Spoke with rounding MD regarding home health needs- pt does not want to go to [facility] and wants to return home with family- has adult sons that can help provide care on discharge- will need Home Health for wound care- pt will not qualify for [facility] therapy as he does not have qualifying dx and has no insurance. CM to continue to follow for [facility] needs- may be ready for d/c in next 1-2 days per MD.   Darrold Span, RN 03/16/2016, 2:12 PM

## 2016-03-17 ENCOUNTER — Telehealth: Payer: Self-pay | Admitting: Nurse Practitioner

## 2016-03-17 ENCOUNTER — Telehealth: Payer: Self-pay | Admitting: Vascular Surgery

## 2016-03-17 ENCOUNTER — Encounter: Payer: Self-pay | Admitting: Nurse Practitioner

## 2016-03-17 NOTE — Telephone Encounter (Signed)
Called pt and left message asking pt to call back to update Fm and medical Hx.

## 2016-03-17 NOTE — Telephone Encounter (Signed)
-----   Message from Sharee Pimple, RN sent at 03/16/2016 10:56 AM EDT ----- Regarding: 2 week  postop   ----- Message -----    From: Raymond Gurney, PA-C    Sent: 03/16/2016  10:23 AM      To: [facility] Charge Pool  S/p thrombectomy RLE, 4 compartment fasciotomies 02/22/16  F/u with Dr. Arbie Cookey in 2 weeks.   Thanks Selena Batten

## 2016-03-17 NOTE — Telephone Encounter (Signed)
Sched appt 8/8 at 9:00. Spoke to pt, lm on hm# as per pt's request to inform them of appt.

## 2016-03-21 ENCOUNTER — Ambulatory Visit (INDEPENDENT_AMBULATORY_CARE_PROVIDER_SITE_OTHER): Payer: Self-pay

## 2016-03-21 ENCOUNTER — Ambulatory Visit (INDEPENDENT_AMBULATORY_CARE_PROVIDER_SITE_OTHER): Payer: Self-pay | Admitting: Nurse Practitioner

## 2016-03-21 ENCOUNTER — Encounter: Payer: Self-pay | Admitting: Nurse Practitioner

## 2016-03-21 VITALS — BP 118/70 | HR 80 | Wt 182.0 lb

## 2016-03-21 DIAGNOSIS — E119 Type 2 diabetes mellitus without complications: Secondary | ICD-10-CM

## 2016-03-21 DIAGNOSIS — Z7901 Long term (current) use of anticoagulants: Secondary | ICD-10-CM

## 2016-03-21 DIAGNOSIS — Z955 Presence of coronary angioplasty implant and graft: Secondary | ICD-10-CM

## 2016-03-21 DIAGNOSIS — I739 Peripheral vascular disease, unspecified: Secondary | ICD-10-CM

## 2016-03-21 DIAGNOSIS — IMO0001 Reserved for inherently not codable concepts without codable children: Secondary | ICD-10-CM

## 2016-03-21 DIAGNOSIS — I213 ST elevation (STEMI) myocardial infarction of unspecified site: Secondary | ICD-10-CM

## 2016-03-21 DIAGNOSIS — I255 Ischemic cardiomyopathy: Secondary | ICD-10-CM

## 2016-03-21 DIAGNOSIS — M3390 Dermatopolymyositis, unspecified, organ involvement unspecified: Secondary | ICD-10-CM

## 2016-03-21 DIAGNOSIS — Z794 Long term (current) use of insulin: Secondary | ICD-10-CM

## 2016-03-21 DIAGNOSIS — Z79899 Other long term (current) drug therapy: Secondary | ICD-10-CM

## 2016-03-21 DIAGNOSIS — I513 Intracardiac thrombosis, not elsewhere classified: Secondary | ICD-10-CM

## 2016-03-21 LAB — HEPATIC FUNCTION PANEL
ALT: 75 U/L — ABNORMAL HIGH (ref 9–46)
AST: 73 U/L — ABNORMAL HIGH (ref 10–35)
Albumin: 2.8 g/dL — ABNORMAL LOW (ref 3.6–5.1)
Alkaline Phosphatase: 79 U/L (ref 40–115)
Bilirubin, Direct: 0.1 mg/dL (ref <=0.2)
Indirect Bilirubin: 0.4 mg/dL (ref 0.2–1.2)
Total Bilirubin: 0.5 mg/dL (ref 0.2–1.2)
Total Protein: 6.9 g/dL (ref 6.1–8.1)

## 2016-03-21 LAB — BASIC METABOLIC PANEL
BUN: 13 mg/dL (ref 7–25)
CO2: 24 mmol/L (ref 20–31)
Calcium: 8.5 mg/dL — ABNORMAL LOW (ref 8.6–10.3)
Chloride: 101 mmol/L (ref 98–110)
Creat: 0.78 mg/dL (ref 0.70–1.33)
Glucose, Bld: 146 mg/dL — ABNORMAL HIGH (ref 65–99)
Potassium: 4.2 mmol/L (ref 3.5–5.3)
Sodium: 134 mmol/L — ABNORMAL LOW (ref 135–146)

## 2016-03-21 LAB — CBC
HCT: 30 % — ABNORMAL LOW (ref 38.5–50.0)
Hemoglobin: 9.8 g/dL — ABNORMAL LOW (ref 13.2–17.1)
MCH: 28.6 pg (ref 27.0–33.0)
MCHC: 32.7 g/dL (ref 32.0–36.0)
MCV: 87.5 fL (ref 80.0–100.0)
MPV: 8.7 fL (ref 7.5–12.5)
Platelets: 665 10*3/uL — ABNORMAL HIGH (ref 140–400)
RBC: 3.43 MIL/uL — ABNORMAL LOW (ref 4.20–5.80)
RDW: 17 % — ABNORMAL HIGH (ref 11.0–15.0)
WBC: 7.3 10*3/uL (ref 3.8–10.8)

## 2016-03-21 LAB — POCT INR: INR: 2.3

## 2016-03-21 LAB — BRAIN NATRIURETIC PEPTIDE: Brain Natriuretic Peptide: 80.2 pg/mL (ref <100)

## 2016-03-21 MED ORDER — LISINOPRIL 2.5 MG PO TABS: 2.5000 mg | ORAL_TABLET | Freq: Every day | ORAL | 6 refills | Status: DC

## 2016-03-21 MED FILL — LISINOPRIL 2.5 MG TABLET: 2.5 | 30 days supply | Qty: 30 | Fill #0

## 2016-03-21 NOTE — Patient Instructions (Addendum)
We will be checking the following labs today - BNP, BMET, CBC  INR today   Medication Instructions:    Continue with your current medicines. BUT  I am adding Lisinopril 2.5 mg a day - this has been sent to your drug store    Testing/Procedures To Be Arranged:  N/A  Follow-Up:   See Dr. Angela Burke in 2 weeks with BMET  See if we can call and get him an appointment with Dr. Leonette Most (used to be his PCP) - last seen about one year ago - otherwise, give information to [employer]     Other Special Instructions:   Will ask [facility] to instruct you on how to give yourself insulin  Pain medicine needs to come from Dr. Bosie Helper office or your PCP    If you need a refill on your cardiac medications before your next appointment, please call your pharmacy.   Call the [facility] office at [phone] if you have any questions, problems or concerns.

## 2016-03-21 NOTE — Addendum Note (Signed)
Addended by: Burnetta Sabin on: 03/21/2016 11:07 AM   Modules accepted: Orders

## 2016-03-21 NOTE — Progress Notes (Addendum)
CARDIOLOGY OFFICE NOTE  Date:  03/21/2016    [name] Date of Birth: 25-Jun-1961 Medical Record #[id number]  PCP:  No PCP Per Patient  Cardiologist:  Eden Emms    Chief Complaint  Patient presents with  . Coronary Artery Disease  . Cardiomyopathy  . PAD    Post hospital visit - seen for Dr. Eden Emms    History of Present Illness: Troy Yu is a 55 y.o. male who presents today for a post hospital visit. Seen for Dr. Eden Emms.   He has had no real past medical history/care until just recently. He is diabetic.   Presented last month to the ER with chest pain and right leg pain.   Initial evaluation includes evaluation by cardiology who noted subacute anterior MI on EKG. No prior history of CAD, PVD, angina or claudication. Was under more stress when his office roof leaked and had been fasting 30 days prior for religious purposes.   He was admitted with subacute anterior MI complicated by LV thrombus and right femoral artery embolism - s/p embolectomy and R calf fasciotomy for compartment syndrome. Underwent PCI. This was complicated by post operative anemia and has received several units of PRBCs. Heparin discontinued as INR became therapeutic on coumadin. Patient refused plastic surgery intervention.During this hospitalization he has also refused to work with physical therapy and refused SNF.  Wound was evaluated by Dr. Arbie Cookey of vascular surgery on 03/15/2016, reporting "continues to have the demarcation of full-thickness loss over the lateral area of the skin closure. No infection with eschar present. Medial wound with good granulating base and minimal debris present. " Dr Arbie Cookey had recommended on multiple occasions plastic surgery consultation for evaluation of split thickness skin graft which he has continued to decline. He reported to Dr. Arbie Cookey not wanting anyone "cutting on his body anymore." Dr Arbie Cookey felt that he could eventually close wound but that this would take  months. On 03/16/2016 having declined further evaluation by plastic surgery Dr Early felt that he was stable for discharge from a vascular standpoint.   For his cardiac issues - he did undergo cath and PCI - he is on triple anticoagulation - to stop aspirin after one month of therapy.   Comes in today. Here with his 2 sons. He does not have a PCP. This was not arranged for him at discharge. He previously saw Dr. Leonette Most with [facility]. He has not given himself insulin - wants "pills" instead. Asking for pain medicine. Family is doing the dressing changes. He has had some upper right quadrant abdominal pain for a day or so right after discharge - says it improved with burping. Does have a gallstone - asking what can be done to "break that up". No active bleeding reported. Not really clear if he is taking his medicines or not. Pretty sedentary. Easily winded - he feels this is related to his Coreg and not due to his severe LV dysfunction.    Past Medical History:  Diagnosis Date  . Diabetes (HCC)   . LV (left ventricular) mural thrombus (HCC)   . NSTEMI (non-ST elevated myocardial infarction) [facility])     Past Surgical History:  Procedure Laterality Date  . APPLICATION OF WOUND VAC Right 02/22/2016   Procedure: APPLICATION OF Negative Pressure Dressing Right Lower Leg;  Surgeon: Fransisco Hertz, MD;  Location: [facility] OR;  Service: Vascular;  Laterality: Right;  . APPLICATION OF WOUND VAC Right 02/26/2016   Procedure: APPLICATION OF WOUND VAC, RIGHT MEDIAL  LOWER LEG FASCIOTOMY SITE;  Surgeon: Larina Earthly, MD;  Location: [facility] OR;  Service: Vascular;  Laterality: Right;  . CARDIAC CATHETERIZATION N/A 03/09/2016   Procedure: Coronary Stent Intervention;  Surgeon: Iran Ouch, MD;  Location: [facility] INVASIVE CV LAB;  Service: Cardiovascular;  Laterality: N/A;  distal RCA mid LAD  . CARDIAC CATHETERIZATION N/A 03/09/2016   Procedure: Intravascular Pressure Wire/FFR Study;  Surgeon: Iran Ouch, MD;  Location:  [facility] INVASIVE CV LAB;  Service: Cardiovascular;  Laterality: N/A;  mid LAD  . CARDIAC CATHETERIZATION N/A 03/09/2016   Procedure: Left Heart Cath and Coronary Angiography;  Surgeon: Iran Ouch, MD;  Location: [facility] INVASIVE CV LAB;  Service: Cardiovascular;  Laterality: N/A;  . EMBOLECTOMY Right 02/21/2016   Procedure: EMBOLECTOMY RIGHT COMMON FEMORAL ARTERY; FOUR COMPARTMENT FASCIOTOMY;  Surgeon: Fransisco Hertz, MD;  Location: [facility] OR;  Service: Vascular;  Laterality: Right;  . FASCIOTOMY CLOSURE Right 02/26/2016   Procedure: RIGHT LATERAL LOWER LEG FASCIOTOMY CLOSURE;  Surgeon: Larina Earthly, MD;  Location: [facility] OR;  Service: Vascular;  Laterality: Right;  . HEMATOMA EVACUATION Right 02/22/2016   Procedure: EVACUATION HEMATOMA With Placement of Negative Pressure Dressing;  Surgeon: Fransisco Hertz, MD;  Location: [facility] OR;  Service: Vascular;  Laterality: Right;     Medications: Current Outpatient Prescriptions  Medication Sig Dispense Refill  . aspirin EC 81 MG EC tablet Take 1 tablet (81 mg total) by mouth daily. 30 tablet 1  . atorvastatin (LIPITOR) 80 MG tablet Take 1 tablet (80 mg total) by mouth daily at 6 PM. 30 tablet 1  . carvedilol (COREG) 6.25 MG tablet Take 1 tablet (6.25 mg total) by mouth 2 (two) times daily with a meal. 60 tablet 1  . clopidogrel (PLAVIX) 75 MG tablet Take 1 tablet (75 mg total) by mouth daily with breakfast. 30 tablet 1  . ibuprofen (ADVIL,MOTRIN) 200 MG tablet Take 400 mg by mouth every 6 (six) hours as needed (for pain.).    Marland Kitchen warfarin (COUMADIN) 5 MG tablet Take 7.5 mg PO on M, W, F, then 5 mg on the other days QTY sufficient 30 tablet 3  . insulin glargine (LANTUS) 100 UNIT/ML injection Inject 0.2 mLs (20 Units total) into the skin at bedtime. (Patient not taking: Reported on 03/21/2016) 10 mL 11  . lisinopril (PRINIVIL,ZESTRIL) 2.5 MG tablet Take 1 tablet (2.5 mg total) by mouth daily. 30 tablet 6   No current facility-administered medications for this visit.      Allergies: No Known Allergies  Social History: The patient  reports that he has never smoked. He does not have any smokeless tobacco history on file. He reports that he does not drink alcohol or use drugs.   Family History: The patient's family history includes Heart Problems in his father and mother.   Review of Systems: Please see the history of present illness.   Otherwise, the review of systems is positive for none.   All other systems are reviewed and negative.   Physical Exam: VS:  BP 118/70   Pulse 80   Wt 182 lb (82.6 kg)   BMI 27.67 kg/m  .  BMI Body mass index is 27.67 kg/m.  Wt Readings from Last 3 Encounters:  03/21/16 182 lb (82.6 kg)  03/16/16 191 lb 1.6 oz (86.7 kg)    General: Looks older than his stated age. He is alert and in no acute distress.  Smells of mothballs.  HEENT: Normal.  Neck: Supple, no JVD,  carotid bruits, or masses noted.  Cardiac: Regular rate and rhythm. Heart tones are distant.  No edema. Right lower leg with bulky dressing in place - not removed.  Respiratory:  Lungs with decreased breath sounds.  GI: Soft and nontender.  MS: No deformity or atrophy. Gait not tested. He is in a wheelchair.  Skin: Warm and dry. Color is normal.  Neuro:  Strength and sensation are intact and no gross focal deficits noted.  Psych: Alert, appropriate and with normal affect.   LABORATORY DATA:  EKG:  EKG is ordered today. This demonstrates NSR.  Lab Results  Component Value Date   WBC 6.6 03/16/2016   HGB 8.5 (L) 03/16/2016   HCT 27.3 (L) 03/16/2016   PLT 525 (H) 03/16/2016   GLUCOSE 119 (H) 03/16/2016   CHOL 116 02/23/2016   TRIG 130 02/23/2016   HDL 30 (L) 02/23/2016   LDLCALC 60 02/23/2016   ALT 16 (L) 03/05/2016   AST 66 (H) 03/05/2016   NA 136 03/16/2016   K 3.9 03/16/2016   CL 105 03/16/2016   CREATININE 0.80 03/16/2016   BUN <5 (L) 03/16/2016   CO2 26 03/16/2016   TSH 3.200 03/03/2016   INR 1.95 (H) 03/16/2016   HGBA1C 9.2 (H)  02/23/2016    Lab Results  Component Value Date   INR 1.95 (H) 03/16/2016   INR 2.09 (H) 03/14/2016   INR 1.47 03/13/2016     BNP (last 3 results) No results for input(s): BNP in the last 8760 hours.  ProBNP (last 3 results) No results for input(s): PROBNP in the last 8760 hours.   Other Studies Reviewed Today:  Cardiac Cath Conclusion from 02/2016    Prox RCA lesion, 10% stenosed.  Dist LAD lesion, 60% stenosed.  Dist RCA lesion, 90% stenosed. Post intervention, there is a 0% residual stenosis.  Mid LAD lesion, 60% stenosed. Post intervention, there is a 0% residual stenosis.   1. Significant 2 vessel coronary artery disease involving the distal right coronary artery and mid LAD. The culprit for recent myocardial infarction seems to be the mid LAD stenosis which appears to be thrombotic and hazy in spite of being only 60% in severity. However, this was significant by FFR.  2. Moderately elevated left ventricular end-diastolic pressure. LV angiography was not performed due to LV thrombus.  3. Successful angioplasty and drug-eluting stent placement to the distal right coronary artery and mid left anterior descending artery.   Recommendations: Recommend treatment with aspirin, Plavix and warfarin for one month. After one month, aspirin can be discontinued. Heparin can be resumed today 8 hours after sheath pull. Warfarin can be started. I switched from propranolol to carvedilol. Continue treatment for cardiomyopathy and add an ACE inhibitor or ARB before hospital discharge. There was residual 60% distal LAD stenosis which was left to be treated medically.   Echo Study Conclusions from 01/2016  - Left ventricle: Anteroapical infarct with large thrombus burden   at apex. Mobile and high embolic potential. The cavity size was   severely dilated. Wall thickness was normal. Systolic function   was moderately to severely reduced. The estimated ejection   fraction was in the  range of 30% to 35%. - Atrial septum: No defect or patent foramen ovale was identified.  Assessment/Plan: 1. Anterior MI - with associated LV dysfunction. He has no active chest pain at this time. Cardiac catheterization with DES to distal RCA and Mid LAD. Residual CAD noted. Would plan to treat medically. Currently on  triple anticoagulation - to stop aspirin after one month of therapy. Try to add low dose ACE  2. Ischemic CM - with EF of 30 to 35%. Last BUN/creatine was normal. Will try to add low dose ACE  3. LV (left ventricular) mural thrombus (HCC) with embolization of right femoral artery resulting in limb ischemia s/p thrombectomy and fasciotomy with post operative course complicated by hematoma and bleeding s/p evacuation of hematoma with application of a VAC in the setting of therapeutic anti-coagulation. Seen by Dr. Arbie Cookey. Has refused plastic surgery intervention. Family doing dressing changes.    4. Symptomatic anemia/ Postoperative anemia/ Anemia secondary to blood loss -During hospitalization, hemoglobin dropped to as low as 4.7 and he required  many prbc transfusions.  Received a total of 11 u PRBCs this admission  5. Newly diagnosed diabetes mellitus/Hyperglycemia -Hemoglobin A1c 9.2.  -Discharged on Lantus 20 units subcutaneous daily but he is not taking. Unfortunately, this will impact his healing of his leg as well as his cardiac status going forward. He did not get any PCP arranged - he previously saw Dr. Leonette Most with [facility] - last seen about a year ago - will try to reestablish.   Abdominal pain  CT abd shows cholelithiasis with fingings concerning for early acute cholecystitis.  -US abdomen ordered, which showed mildly dilated gall bladder, with a 9 mm gall bladder stone impacted in the necko f the gall bladder, no pericholecystic fluid no wall thickening. Negative murphy's sign. Will have to monitor for now. Not a candidate for any type of surgical intervention  at this time.   Unfortunately, given his issues and refusal to some of his recommended care, I do not see this going well. Overall prognosis tenuous at best.   Current medicines are reviewed with the patient today.  The patient does not have concerns regarding medicines other than what has been noted above.  The following changes have been made:  See above.  Labs/ tests ordered today include:    Orders Placed This Encounter  Procedures  . Brain natriuretic peptide  . Basic metabolic panel  . CBC  . Hepatic function panel  . Ambulatory referral to Internal Medicine  . EKG 12-Lead     Disposition:   FU with Dr. Eden Emms and team in 2 weeks. BMET on return.   Patient is agreeable to this plan and will call if any problems develop in the interim.   Signed: Rosalio Macadamia, RN, ANP-C 03/21/2016 10:30 AM  [facility] [address] [place], [place]  [address] Phone: [phone] Fax: [phone]       Addendum: Subject: Referral to Dr Glenetta Hew                Pt has never been seen at [facility] and it was ask that if pt could not see Leonette Most give him the Health and wellness info. Dr Leonette Most is not accepting new pt he was given the info to call health and wellness.     Lamar Laundry

## 2016-03-21 NOTE — Patient Instructions (Signed)

## 2016-03-23 ENCOUNTER — Other Ambulatory Visit: Payer: Self-pay | Admitting: *Deleted

## 2016-03-23 MED ORDER — ATORVASTATIN CALCIUM 80 MG PO TABS: 40.0000 mg | ORAL_TABLET | Freq: Every day | ORAL | 1 refills | Status: DC

## 2016-03-25 ENCOUNTER — Telehealth: Payer: Self-pay | Admitting: Nurse Practitioner

## 2016-03-25 NOTE — Telephone Encounter (Signed)
.  Pt c/o BP issue: STAT if pt c/o blurred vision, one-sided weakness or slurred speech  1. What are your last 5 BP readings? 97/70  2. Are you having any other symptoms (ex. Dizziness, headache, blurred vision, passed out)? no 3. What is your BP issue? low

## 2016-03-25 NOTE — Telephone Encounter (Signed)
Pt recently seen by Dawayne Patricia. NP who started pt on Lisinopril 2.5 mg daily. Pt stated his BP has been running low and he did not know why she put him on Lisinopril. He said he did not have high BP. I explained to pt that some medications can be used for more than 1 thing. I explained that NP may have put him on the Lisinopril due to his cardiomyopathy.    Pt stated BP today was 97/70. I asked for other BP readings however, pt has not kept a diary of readings. I explained to the pt that this is important since the BP readings will help the dr see if the medication is helping or too much medication. I did however tell pt to hold the Lisinopril today as he has chosen already to do this. I asked pt to monitor BP every day. I advised pt I wil d/w one of the dr in the office today due to Norma Fredrickson, NP is not in the office today. I reassured pt that though his BP is a little ,low for him he says it is not dangerously low, this made pt feel better. I advised pt that I will cb after I s/w one of the dr to see if any further recommendations. I did advise pt to call the On-call provider over the weekend if BP goes lower than the 97/70. Pt agreeable to plan of care.

## 2016-03-28 ENCOUNTER — Encounter (INDEPENDENT_AMBULATORY_CARE_PROVIDER_SITE_OTHER): Payer: Self-pay

## 2016-03-28 ENCOUNTER — Ambulatory Visit (INDEPENDENT_AMBULATORY_CARE_PROVIDER_SITE_OTHER): Payer: Self-pay | Admitting: *Deleted

## 2016-03-28 DIAGNOSIS — Z7901 Long term (current) use of anticoagulants: Secondary | ICD-10-CM

## 2016-03-28 DIAGNOSIS — I513 Intracardiac thrombosis, not elsewhere classified: Secondary | ICD-10-CM

## 2016-03-28 DIAGNOSIS — I213 ST elevation (STEMI) myocardial infarction of unspecified site: Secondary | ICD-10-CM

## 2016-03-28 LAB — POCT INR: INR: 2.5

## 2016-03-28 NOTE — Telephone Encounter (Signed)
I s/w pt this morning. Pt aware s/w Boyce Medici, PA who states BP is fine. Pt started on Lisinopril per Dawayne Patricia. NP for cardiomyopathy and to help his heart function. Pt advised if BP continues to drop to call office.

## 2016-03-31 ENCOUNTER — Encounter: Payer: Self-pay | Admitting: Vascular Surgery

## 2016-03-31 NOTE — Telephone Encounter (Signed)
Oren Section RN with [facility] wanted Norma Fredrickson NP know patient is not taking his lisinopril. Patient's highest BP was 104/64. Will forward to Norma Fredrickson NP, so she is aware.

## 2016-04-04 ENCOUNTER — Encounter (INDEPENDENT_AMBULATORY_CARE_PROVIDER_SITE_OTHER): Payer: Self-pay

## 2016-04-04 ENCOUNTER — Other Ambulatory Visit: Payer: Self-pay

## 2016-04-04 ENCOUNTER — Ambulatory Visit (INDEPENDENT_AMBULATORY_CARE_PROVIDER_SITE_OTHER): Payer: Self-pay | Admitting: Nurse Practitioner

## 2016-04-04 ENCOUNTER — Encounter: Payer: Self-pay | Admitting: Nurse Practitioner

## 2016-04-04 VITALS — BP 110/76 | HR 91 | Ht 68.0 in | Wt 177.0 lb

## 2016-04-04 DIAGNOSIS — Z7901 Long term (current) use of anticoagulants: Secondary | ICD-10-CM

## 2016-04-04 DIAGNOSIS — I259 Chronic ischemic heart disease, unspecified: Secondary | ICD-10-CM

## 2016-04-04 DIAGNOSIS — I213 ST elevation (STEMI) myocardial infarction of unspecified site: Secondary | ICD-10-CM

## 2016-04-04 DIAGNOSIS — R7989 Other specified abnormal findings of blood chemistry: Secondary | ICD-10-CM

## 2016-04-04 DIAGNOSIS — E785 Hyperlipidemia, unspecified: Secondary | ICD-10-CM

## 2016-04-04 DIAGNOSIS — R945 Abnormal results of liver function studies: Secondary | ICD-10-CM

## 2016-04-04 DIAGNOSIS — I255 Ischemic cardiomyopathy: Secondary | ICD-10-CM

## 2016-04-04 NOTE — Patient Instructions (Addendum)
We will be checking the following labs today -  CBC, BMET, HPF   Medication Instructions:    Continue with your current medicines. BUT  STOP aspirin after August 12th and continue with your Plavix and coumadin.   We would like for you to take the Lisinopril at 2.5 mg a day - this is for weakness of your heart muscle not blood pressure.     Testing/Procedures To Be Arranged:  N/A  Follow-Up:   See Dr. Eden Emms - first available.   We need for you to go to [facility] to establish with primary care.     Other Special Instructions:   N/A    If you need a refill on your cardiac medications before your next appointment, please call your pharmacy.   Call the [facility] office at [phone] if you have any questions, problems or concerns.

## 2016-04-04 NOTE — Progress Notes (Addendum)
CARDIOLOGY OFFICE NOTE  Date:  04/04/2016    Troy Yu Date of Birth: 10-15-1960 Medical Record #[id number]  PCP:  No PCP Per Patient  Cardiologist:  Eden Emms  Chief Complaint  Patient presents with  . Coronary Artery Disease  . Congestive Heart Failure    Seen for Dr. Eden Emms.     History of Present Illness: Troy Yu is a 55 y.o. male who presents today for a follow up visit. Seen for Dr. Eden Emms.   He has had no real past medical history/care until just recently. He is diabetic.   Presented in June to the ER with chest pain and right leg pain.   Initial evaluation includes evaluation by cardiology who noted subacute anterior MI on EKG. No prior history of CAD, PVD, angina or claudication. Was under more stress when his office roof leaked and had been fasting 30 days prior for religious purposes.   He was admitted with subacute anterior MI complicated by LV thrombus and right femoral artery embolism - s/p embolectomy and R calf fasciotomy for compartment syndrome. Underwent PCI. This was complicated by post operative anemia and has received several units of PRBCs. Heparin discontinued as INR became therapeutic on coumadin. Patient refused plastic surgery intervention.During this hospitalization he has also refused to work with physical therapy and refused SNF.  Wound was evaluated by Dr. Arbie Cookey of vascular surgery on 03/15/2016, reporting "continues to have the demarcation of full-thickness loss over the lateral area of the skin closure. No infection with eschar present. Medial wound with good granulating base and minimal debris present. " Dr Arbie Cookey had recommended on multiple occasions plastic surgery consultation for evaluation of split thickness skin graft which he has continued to decline. He reported to Dr. Arbie Cookey not wanting anyone "cutting on his body anymore." Dr Arbie Cookey felt that he could eventually close wound but that this would take months. On 03/16/2016  having declined further evaluation by plastic surgery Dr Early felt that he was stable for discharge from a vascular standpoint.   For his cardiac issues - he did undergo cath and PCI - he is on triple anticoagulation - to stop aspirin after one month of therapy.   I saw him 2 weeks ago - no PCP. Did not want to take insulin - wanted "pills" instead. Very poor insight into his cardiac issues despite lengthy conversation. Asking for pain medicine for his leg. Family doing dressing changes - had refused wound care clinic. Very unclear if he was taking his medicines or not. Easily winded - he felt like this was from Coreg and not his severe LV dyfsfunction. Tried to add low dose ACE - he did not take due to soft BP.   Comes in today. Here with his 1 son today. 25 minutes late for this visit. Yelling at all the staff. Notes his wound on the lower right leg "is smelling". Seeing Dr. Arbie Cookey tomorrow. Not taking his insulin every day - despite A1C. Not on his ACE. Has not established with primary care. Says his sugars is now below 100. Still with very poor insight into his issues. Tells me he does not believe he has weak heart muscle. Tells me he does not have high blood pressure and does not feel he should take the ACE.   Past Medical History:  Diagnosis Date  . Diabetes (HCC)   . LV (left ventricular) mural thrombus (HCC)   . NSTEMI (non-ST elevated myocardial infarction) [facility])     Past Surgical  History:  Procedure Laterality Date  . APPLICATION OF WOUND VAC Right 02/22/2016   Procedure: APPLICATION OF Negative Pressure Dressing Right Lower Leg;  Surgeon: Fransisco Hertz, MD;  Location: [facility] OR;  Service: Vascular;  Laterality: Right;  . APPLICATION OF WOUND VAC Right 02/26/2016   Procedure: APPLICATION OF WOUND VAC, RIGHT MEDIAL LOWER LEG FASCIOTOMY SITE;  Surgeon: Larina Earthly, MD;  Location: [facility] OR;  Service: Vascular;  Laterality: Right;  . CARDIAC CATHETERIZATION N/A 03/09/2016   Procedure: Coronary  Stent Intervention;  Surgeon: Iran Ouch, MD;  Location: [facility] INVASIVE CV LAB;  Service: Cardiovascular;  Laterality: N/A;  distal RCA mid LAD  . CARDIAC CATHETERIZATION N/A 03/09/2016   Procedure: Intravascular Pressure Wire/FFR Study;  Surgeon: Iran Ouch, MD;  Location: [facility] INVASIVE CV LAB;  Service: Cardiovascular;  Laterality: N/A;  mid LAD  . CARDIAC CATHETERIZATION N/A 03/09/2016   Procedure: Left Heart Cath and Coronary Angiography;  Surgeon: Iran Ouch, MD;  Location: [facility] INVASIVE CV LAB;  Service: Cardiovascular;  Laterality: N/A;  . EMBOLECTOMY Right 02/21/2016   Procedure: EMBOLECTOMY RIGHT COMMON FEMORAL ARTERY; FOUR COMPARTMENT FASCIOTOMY;  Surgeon: Fransisco Hertz, MD;  Location: [facility] OR;  Service: Vascular;  Laterality: Right;  . FASCIOTOMY CLOSURE Right 02/26/2016   Procedure: RIGHT LATERAL LOWER LEG FASCIOTOMY CLOSURE;  Surgeon: Larina Earthly, MD;  Location: [facility] OR;  Service: Vascular;  Laterality: Right;  . HEMATOMA EVACUATION Right 02/22/2016   Procedure: EVACUATION HEMATOMA With Placement of Negative Pressure Dressing;  Surgeon: Fransisco Hertz, MD;  Location: [facility] OR;  Service: Vascular;  Laterality: Right;     Medications: Current Outpatient Prescriptions  Medication Sig Dispense Refill  . aspirin EC 81 MG EC tablet Take 1 tablet (81 mg total) by mouth daily. 30 tablet 1  . atorvastatin (LIPITOR) 80 MG tablet Take 0.5 tablets (40 mg total) by mouth daily at 6 PM. 30 tablet 1  . carvedilol (COREG) 6.25 MG tablet Take 1 tablet (6.25 mg total) by mouth 2 (two) times daily with a meal. 60 tablet 1  . clopidogrel (PLAVIX) 75 MG tablet Take 1 tablet (75 mg total) by mouth daily with breakfast. 30 tablet 1  . ibuprofen (ADVIL,MOTRIN) 200 MG tablet Take 400 mg by mouth every 6 (six) hours as needed (for pain.).    Marland Kitchen insulin glargine (LANTUS) 100 UNIT/ML injection Inject 0.2 mLs (20 Units total) into the skin at bedtime. 10 mL 11  . warfarin (COUMADIN) 5 MG tablet Take 7.5 mg PO on  M, W, F, then 5 mg on the other days QTY sufficient 30 tablet 3  . lisinopril (PRINIVIL,ZESTRIL) 2.5 MG tablet Take 1 tablet (2.5 mg total) by mouth daily. (Patient not taking: Reported on 04/04/2016) 30 tablet 6   No current facility-administered medications for this visit.     Allergies: No Known Allergies  Social History: The patient  reports that he has never smoked. He does not have any smokeless tobacco history on file. He reports that he does not drink alcohol or use drugs.   Family History: The patient's family history includes Heart Problems in his father and mother.   Review of Systems: Please see the history of present illness.   Otherwise, the review of systems is positive for none.   All other systems are reviewed and negative.   Physical Exam: VS:  BP 110/76   Pulse 91   Ht 5\' 8"  (1.727 m)   Wt 177 lb (80.3 kg)  SpO2 100% Comment: at rest  BMI 26.91 kg/m  .  BMI Body mass index is 26.91 kg/m.  Wt Readings from Last 3 Encounters:  04/04/16 177 lb (80.3 kg)  03/21/16 182 lb (82.6 kg)  03/16/16 191 lb 1.6 oz (86.7 kg)    General: Alert. Very argumentative. He is alert and in no acute distress. His weight is down 5 more pounds.  He was subsequently examined by Dr. Eden Emms.  HEENT: Normal.  Neck: Supple, no JVD, carotid bruits, or masses noted.  Cardiac:  Rhythm is regular. No edema. The right lower leg is wrapped.  Respiratory:  Lungs are clear to auscultation bilaterally with normal work of breathing.  GI: Soft and nontender.  MS: No deformity or atrophy. Gait not tested.   Skin: Warm and dry. Color is normal.  Neuro:  Strength and sensation are intact and no gross focal deficits noted.  Psych: Alert, appropriate and with normal affect.   LABORATORY DATA:  EKG:  EKG is not ordered today.  Lab Results  Component Value Date   WBC 7.3 03/21/2016   HGB 9.8 (L) 03/21/2016   HCT 30.0 (L) 03/21/2016   PLT 665 (H) 03/21/2016   GLUCOSE 146 (H) 03/21/2016    CHOL 116 02/23/2016   TRIG 130 02/23/2016   HDL 30 (L) 02/23/2016   LDLCALC 60 02/23/2016   ALT 75 (H) 03/21/2016   AST 73 (H) 03/21/2016   NA 134 (L) 03/21/2016   K 4.2 03/21/2016   CL 101 03/21/2016   CREATININE 0.78 03/21/2016   BUN 13 03/21/2016   CO2 24 03/21/2016   TSH 3.200 03/03/2016   INR 2.5 03/28/2016   HGBA1C 9.2 (H) 02/23/2016    BNP (last 3 results)  Recent Labs  03/21/16 1118  BNP 80.2    ProBNP (last 3 results) No results for input(s): PROBNP in the last 8760 hours.   Other Studies Reviewed Today:  Cardiac Cath Conclusion from 02/2016    Prox RCA lesion, 10% stenosed.  Dist LAD lesion, 60% stenosed.  Dist RCA lesion, 90% stenosed. Post intervention, there is a 0% residual stenosis.  Mid LAD lesion, 60% stenosed. Post intervention, there is a 0% residual stenosis.  1. Significant 2 vessel coronary artery disease involving the distal right coronary artery and mid LAD. The culprit for recent myocardial infarction seems to be the mid LAD stenosis which appears to be thrombotic and hazy in spite of being only 60% in severity. However, this was significant by FFR.  2. Moderately elevated left ventricular end-diastolic pressure. LV angiography was not performed due to LV thrombus.  3. Successful angioplasty and drug-eluting stent placement to the distal right coronary artery and mid left anterior descending artery.   Recommendations: Recommend treatment with aspirin, Plavix and warfarin for one month. After one month, aspirin can be discontinued. Heparin can be resumed today 8 hours after sheath pull. Warfarin can be started. I switched from propranolol to carvedilol. Continue treatment for cardiomyopathy and add an ACE inhibitor or ARB before hospital discharge. There was residual 60% distal LAD stenosis which was left to be treated medically.   Echo Study Conclusions from 01/2016  - Left ventricle: Anteroapical infarct with large thrombus  burden at apex. Mobile and high embolic potential. The cavity size was severely dilated. Wall thickness was normal. Systolic function was moderately to severely reduced. The estimated ejection fraction was in the range of 30% to 35%. - Atrial septum: No defect or patent foramen ovale was identified.  Assessment/Plan: 1. Anterior MI - with associated LV dysfunction. Cardiac catheterization with DES to distal RCA and Mid LAD. Residual CAD noted. Would plan to treat medically. Currently on triple anticoagulation - to stop aspirin after one month of therapy - this will be after August 12th. Try to add low dose ACE again. The patient was subsequently seen with Dr. Eden Emms due to his argumentative state.   2. Ischemic CM - with EF of 30 to 35%. Last BUN/creatine was normal. Have tried to add ACE but he has stopped due to his soft BP. Will try to add again.   3. LV (left ventricular) mural thrombus (HCC) with embolization of right femoral artery resulting in limb ischemia s/p thrombectomy and fasciotomy with post operative course complicated by hematoma and bleeding s/p evacuation of hematoma with application of a VAC in the setting of therapeutic anti-coagulation. Seen by Dr. Arbie Cookey. Has refused plastic surgery intervention. Family doing dressing changes.   4. Symptomatic anemia/ Postoperative anemia/ Anemia secondary to blood loss -During hospitalization, hemoglobin dropped to as low as 4.7 and he required many prbc transfusions.  Received a total of 11 u PRBCs this past admission  Needs lab today  5. Newly diagnosed diabetes mellitus/Hyperglycemia -Hemoglobin A1c 9.2.  -Discharged on Lantus 20 units subcutaneous daily but he is not taking. Unfortunately, this will impact his healing of his leg as well as his cardiac status going forward. He has still not got a PCP arranged - needs to go to [facility] - this was reitered once again today.   Abdominal pain  CT abd shows  cholelithiasis with fingings concerning for early acute cholecystitis.  -US abdomen ordered, which showed mildly dilated gall bladder, with a 9 mm gall bladder stone impacted in the necko f the gall bladder, no pericholecystic fluid no wall thickening. Negative murphy's sign. Will have to monitor for now. Not a candidate for any type of surgical intervention at this time.   This was not discussed today.    Current medicines are reviewed with the patient today.  The patient does not have concerns regarding medicines other than what has been noted above.  The following changes have been made:  See above.  Labs/ tests ordered today include:   No orders of the defined types were placed in this encounter.    Disposition:   FU with Dr. Eden Emms - his first available.   Patient is agreeable to this plan and will call if any problems develop in the interim.   Signed: Rosalio Macadamia, RN, ANP-C 04/04/2016 3:45 PM  [facility] [address] [place], [place]  [address] Phone: [phone] Fax: [phone]

## 2016-04-05 ENCOUNTER — Ambulatory Visit (INDEPENDENT_AMBULATORY_CARE_PROVIDER_SITE_OTHER): Payer: Self-pay | Admitting: Vascular Surgery

## 2016-04-05 ENCOUNTER — Ambulatory Visit (INDEPENDENT_AMBULATORY_CARE_PROVIDER_SITE_OTHER): Payer: Self-pay | Admitting: *Deleted

## 2016-04-05 ENCOUNTER — Telehealth: Payer: Self-pay | Admitting: *Deleted

## 2016-04-05 ENCOUNTER — Encounter: Payer: Self-pay | Admitting: Vascular Surgery

## 2016-04-05 VITALS — BP 84/61 | HR 86 | Temp 98.6°F | Ht 68.0 in | Wt 177.0 lb

## 2016-04-05 DIAGNOSIS — I513 Intracardiac thrombosis, not elsewhere classified: Secondary | ICD-10-CM

## 2016-04-05 DIAGNOSIS — I213 ST elevation (STEMI) myocardial infarction of unspecified site: Secondary | ICD-10-CM

## 2016-04-05 DIAGNOSIS — Z7901 Long term (current) use of anticoagulants: Secondary | ICD-10-CM

## 2016-04-05 DIAGNOSIS — Z9889 Other specified postprocedural states: Secondary | ICD-10-CM

## 2016-04-05 LAB — CBC
HCT: 36.3 % — ABNORMAL LOW (ref 38.5–50.0)
Hemoglobin: 11.6 g/dL — ABNORMAL LOW (ref 13.2–17.1)
MCH: 27.7 pg (ref 27.0–33.0)
MCHC: 32 g/dL (ref 32.0–36.0)
MCV: 86.6 fL (ref 80.0–100.0)
MPV: 9.7 fL (ref 7.5–12.5)
Platelets: 402 10*3/uL — ABNORMAL HIGH (ref 140–400)
RBC: 4.19 MIL/uL — ABNORMAL LOW (ref 4.20–5.80)
RDW: 17.1 % — ABNORMAL HIGH (ref 11.0–15.0)
WBC: 7.1 10*3/uL (ref 3.8–10.8)

## 2016-04-05 LAB — BASIC METABOLIC PANEL
BUN: 13 mg/dL (ref 7–25)
CO2: 24 mmol/L (ref 20–31)
Calcium: 8.9 mg/dL (ref 8.6–10.3)
Chloride: 101 mmol/L (ref 98–110)
Creat: 0.9 mg/dL (ref 0.70–1.33)
Glucose, Bld: 264 mg/dL — ABNORMAL HIGH (ref 65–99)
Potassium: 4.3 mmol/L (ref 3.5–5.3)
Sodium: 136 mmol/L (ref 135–146)

## 2016-04-05 LAB — HEPATIC FUNCTION PANEL
ALT: 23 U/L (ref 9–46)
AST: 20 U/L (ref 10–35)
Albumin: 3.2 g/dL — ABNORMAL LOW (ref 3.6–5.1)
Alkaline Phosphatase: 77 U/L (ref 40–115)
Bilirubin, Direct: 0.1 mg/dL (ref <=0.2)
Indirect Bilirubin: 0.2 mg/dL (ref 0.2–1.2)
Total Bilirubin: 0.3 mg/dL (ref 0.2–1.2)
Total Protein: 7.1 g/dL (ref 6.1–8.1)

## 2016-04-05 LAB — POCT INR: INR: 1.9

## 2016-04-05 NOTE — Telephone Encounter (Signed)
Pt seen in OV yesterday during that visit BP was 110/76 he was instructed to start taking Lisinopril 2.5mg s, thus it took 1st dosage last night at approx 6pm. Today during his OV with Dr Arbie Cookey his BP was 84/61. Pt is concerned and wants to know if he should continue taking Lisinopril. Please call pt to advise.

## 2016-04-05 NOTE — Progress Notes (Signed)
   Patient name: Maliq Inclan MRN: [id number] DOB: Jul 30, 1961 Sex: male  REASON FOR VISIT: Follow-up recent extensive hospitalization. She presented with profound ischemia of his right leg and underwent thrombectomy and fasciotomy with Dr. Claudie Fisherman. He had multiple complications following the surgery to include bleeding from his fasciotomy wounds. He had severe coronary disease requiring stent placement and also left ventricular thrombus requiring ongoing anticoagulation. He had closure of his lateral aspect of his fasciotomy and ongoing dressing changes to the medial aspect. He was offered plastic surgery evaluation for closure but refused this wishing to undergo local care understands this would take months to heal. He is here today for initial follow-up    Current Outpatient Prescriptions  Medication Sig Dispense Refill  . aspirin EC 81 MG EC tablet Take 1 tablet (81 mg total) by mouth daily. 30 tablet 1  . atorvastatin (LIPITOR) 80 MG tablet Take 0.5 tablets (40 mg total) by mouth daily at 6 PM. 30 tablet 1  . carvedilol (COREG) 6.25 MG tablet Take 1 tablet (6.25 mg total) by mouth 2 (two) times daily with a meal. 60 tablet 1  . clopidogrel (PLAVIX) 75 MG tablet Take 1 tablet (75 mg total) by mouth daily with breakfast. 30 tablet 1  . ibuprofen (ADVIL,MOTRIN) 200 MG tablet Take 400 mg by mouth every 6 (six) hours as needed (for pain.).    Marland Kitchen insulin glargine (LANTUS) 100 UNIT/ML injection Inject 0.2 mLs (20 Units total) into the skin at bedtime. 10 mL 11  . warfarin (COUMADIN) 5 MG tablet Take 7.5 mg PO on M, W, F, then 5 mg on the other days QTY sufficient 30 tablet 3  . lisinopril (PRINIVIL,ZESTRIL) 2.5 MG tablet Take 1 tablet (2.5 mg total) by mouth daily. (Patient not taking: Reported on 04/04/2016) 30 tablet 6   No current facility-administered medications for this visit.      PHYSICAL EXAM: Vitals:   04/05/16 0902  BP: (!) 84/61  Pulse: 86  Temp:  98.6 F (37 C)  TempSrc: Oral  SpO2: 99%  Weight: 177 lb (80.3 kg)  Height: 5\' 8"  (1.727 m)    GENERAL: The patient is a well-nourished male, in no acute distress. The vital signs are documented above. He does have a 2+ dorsalis pedis pulse. The lateral aspect has an extensive eschar over the closure area with some separation. The medial aspect actually looks very nice with good granulating base  MEDICAL ISSUES: Extensive wounds from fasciotomy. No evidence of infection. Will continue hydrogel to the areas of open wound and will continue to keep the area dry. He is reluctant to walk saying that this causes oozing and bleeding from the fasciotomy open area. Explain the importance of continuing to progress his ambulation so he does not develop contractures and difficulty with walking in the future. We will see him again in 3 weeks   Larina Earthly, MD [facility] Tel [phone] Pager [phone]

## 2016-04-05 NOTE — Telephone Encounter (Signed)
Will forward to Norma Fredrickson NP for advisement, since she saw patient yesterday. Per patient's instructions at office visit -  " Medication Instructions:   Continue with your current medicines. BUT  STOP aspirin after August 12th and continue with your Plavix and coumadin. We would like for you to take the Lisinopril at 2.5 mg a day - this is for weakness of your heart muscle not blood pressure."

## 2016-04-05 NOTE — Telephone Encounter (Signed)
Called patient about holding his lisinopril for now. Asked patient to keep a record of his BPs and let us know what his BP is doing in a week. Patient verbalized understanding.

## 2016-04-05 NOTE — Telephone Encounter (Signed)
Would hold the Lisinopril for now.

## 2016-04-11 MED FILL — WARFARIN SODIUM 5 MG TABLET: 5 | 28 days supply | Qty: 30 | Fill #1

## 2016-04-12 ENCOUNTER — Ambulatory Visit (INDEPENDENT_AMBULATORY_CARE_PROVIDER_SITE_OTHER): Payer: Self-pay

## 2016-04-12 ENCOUNTER — Telehealth: Payer: Self-pay

## 2016-04-12 ENCOUNTER — Encounter (INDEPENDENT_AMBULATORY_CARE_PROVIDER_SITE_OTHER): Payer: Self-pay

## 2016-04-12 DIAGNOSIS — I213 ST elevation (STEMI) myocardial infarction of unspecified site: Secondary | ICD-10-CM

## 2016-04-12 DIAGNOSIS — Z7901 Long term (current) use of anticoagulants: Secondary | ICD-10-CM

## 2016-04-12 DIAGNOSIS — I513 Intracardiac thrombosis, not elsewhere classified: Secondary | ICD-10-CM

## 2016-04-12 LAB — POCT INR: INR: 1.5

## 2016-04-12 NOTE — Telephone Encounter (Signed)
Ok to right letter he was in hospital from 02/20/26-03/16/16

## 2016-04-12 NOTE — Telephone Encounter (Signed)
Patient is requesting a letter that states the dates he was in the hospital, and that he is still unable to work at home. Will route to Dr. Eden Emms to for advisement.

## 2016-04-13 NOTE — Telephone Encounter (Signed)
Called patient to let him know that is a letter at the front desk for him. Patient verbalized understanding.

## 2016-04-15 MED FILL — CARVEDILOL 6.25 MG TABLET: 6.25 | 30 days supply | Qty: 60 | Fill #1

## 2016-04-15 MED FILL — CLOPIDOGREL 75 MG TABLET: 75 | 30 days supply | Qty: 30 | Fill #1

## 2016-04-19 ENCOUNTER — Ambulatory Visit (INDEPENDENT_AMBULATORY_CARE_PROVIDER_SITE_OTHER): Payer: Self-pay | Admitting: *Deleted

## 2016-04-19 DIAGNOSIS — Z7901 Long term (current) use of anticoagulants: Secondary | ICD-10-CM

## 2016-04-19 DIAGNOSIS — I513 Intracardiac thrombosis, not elsewhere classified: Secondary | ICD-10-CM

## 2016-04-19 DIAGNOSIS — I213 ST elevation (STEMI) myocardial infarction of unspecified site: Secondary | ICD-10-CM

## 2016-04-19 LAB — POCT INR: INR: 2.1

## 2016-04-22 ENCOUNTER — Encounter: Payer: Self-pay | Admitting: Vascular Surgery

## 2016-04-26 ENCOUNTER — Encounter: Payer: Self-pay | Admitting: Vascular Surgery

## 2016-04-26 ENCOUNTER — Ambulatory Visit (INDEPENDENT_AMBULATORY_CARE_PROVIDER_SITE_OTHER): Payer: Self-pay | Admitting: Vascular Surgery

## 2016-04-26 ENCOUNTER — Ambulatory Visit (INDEPENDENT_AMBULATORY_CARE_PROVIDER_SITE_OTHER): Payer: Self-pay

## 2016-04-26 ENCOUNTER — Telehealth: Payer: Self-pay | Admitting: Cardiovascular Disease

## 2016-04-26 VITALS — BP 111/76 | HR 83 | Temp 97.6°F | Resp 18 | Ht 68.0 in | Wt 179.0 lb

## 2016-04-26 DIAGNOSIS — Z9889 Other specified postprocedural states: Secondary | ICD-10-CM

## 2016-04-26 DIAGNOSIS — I513 Intracardiac thrombosis, not elsewhere classified: Secondary | ICD-10-CM

## 2016-04-26 DIAGNOSIS — I213 ST elevation (STEMI) myocardial infarction of unspecified site: Secondary | ICD-10-CM

## 2016-04-26 DIAGNOSIS — Z7901 Long term (current) use of anticoagulants: Secondary | ICD-10-CM

## 2016-04-26 LAB — POCT INR: INR: 1.5

## 2016-04-26 NOTE — Telephone Encounter (Signed)
Patient had a coumadin appointment and requested to talk to a nurse afterwards. Patient has questions about his blood sugar. Informed patient that this is something his PCP should be following. Encouraged patient to find a PCP or go to the [facility]. Patient verbalized understanding and will call [facility] for an appointment.

## 2016-04-26 NOTE — Progress Notes (Signed)
   Patient name: Troy Yu MRN: [id number] DOB: 1961/06/14 Sex: male  REASON FOR VISIT: Here today for follow-up of right lower extremity fasciotomy sites. Continued home health seeing him once a week and otherwise he and his sons are changing his dressing  HPI: Troy Yu is a 55 y.o. male here for follow-up regarding fasciotomies  Current Outpatient Prescriptions  Medication Sig Dispense Refill  . atorvastatin (LIPITOR) 80 MG tablet Take 0.5 tablets (40 mg total) by mouth daily at 6 PM. 30 tablet 1  . carvedilol (COREG) 6.25 MG tablet Take 1 tablet (6.25 mg total) by mouth 2 (two) times daily with a meal. 60 tablet 1  . clopidogrel (PLAVIX) 75 MG tablet Take 1 tablet (75 mg total) by mouth daily with breakfast. 30 tablet 1  . ibuprofen (ADVIL,MOTRIN) 200 MG tablet Take 400 mg by mouth every 6 (six) hours as needed (for pain.).    Marland Kitchen warfarin (COUMADIN) 5 MG tablet Take 7.5 mg PO on M, W, F, then 5 mg on the other days QTY sufficient 30 tablet 3  . aspirin EC 81 MG EC tablet Take 1 tablet (81 mg total) by mouth daily. (Patient not taking: Reported on 04/12/2016) 30 tablet 1  . insulin glargine (LANTUS) 100 UNIT/ML injection Inject 0.2 mLs (20 Units total) into the skin at bedtime. (Patient not taking: Reported on 04/26/2016) 10 mL 11  . lisinopril (PRINIVIL,ZESTRIL) 2.5 MG tablet Take 1 tablet (2.5 mg total) by mouth daily. (Patient not taking: Reported on 04/04/2016) 30 tablet 6   No current facility-administered medications for this visit.      PHYSICAL EXAM: Vitals:   04/26/16 0915  BP: 111/76  Pulse: 83  Resp: 18  Temp: 97.6 F (36.4 C)  TempSrc: Oral  SpO2: 100%  Weight: 179 lb (81.2 kg)  Height: 5\' 8"  (1.727 m)    GENERAL: The patient is a well-nourished male, in no acute distress. The vital signs are documented above. The medial aspect looks quite good today. There is a good granulation tissue throughout. The lateral fasciotomy  has a very large eschar which is slowly separating. 2+ right dorsalis pedis pulse   I debrided the large eschar over the entire aspect of his right side fasciotomy. There is good granulation tissue below this.  MEDICAL ISSUES: Continued with saline dressing changes to both medial and lateral fasciotomy sites. Most be seen again in 3 weeks or continued follow-up   Larina Earthly, MD [facility] Tel [phone] Pager [phone]

## 2016-04-26 NOTE — Telephone Encounter (Signed)
Troy Yu is wanting to speak to someone about  his blood sugar is low and is wanting to  shall he do . Please call

## 2016-04-26 NOTE — Telephone Encounter (Signed)
Left message to call back  

## 2016-04-27 ENCOUNTER — Encounter: Payer: Self-pay | Admitting: Vascular Surgery

## 2016-05-03 ENCOUNTER — Ambulatory Visit (INDEPENDENT_AMBULATORY_CARE_PROVIDER_SITE_OTHER): Payer: Self-pay | Admitting: *Deleted

## 2016-05-03 DIAGNOSIS — I513 Intracardiac thrombosis, not elsewhere classified: Secondary | ICD-10-CM

## 2016-05-03 DIAGNOSIS — Z7901 Long term (current) use of anticoagulants: Secondary | ICD-10-CM

## 2016-05-03 DIAGNOSIS — I213 ST elevation (STEMI) myocardial infarction of unspecified site: Secondary | ICD-10-CM

## 2016-05-03 LAB — POCT INR: INR: 1.8

## 2016-05-03 MED ORDER — WARFARIN SODIUM 5 MG PO TABS: ORAL_TABLET | ORAL | 3 refills | Status: DC

## 2016-05-03 MED FILL — WARFARIN SODIUM 5 MG TABLET: 5 | 30 days supply | Qty: 50 | Fill #0

## 2016-05-10 ENCOUNTER — Encounter: Payer: Self-pay | Admitting: *Deleted

## 2016-05-10 ENCOUNTER — Ambulatory Visit (INDEPENDENT_AMBULATORY_CARE_PROVIDER_SITE_OTHER): Payer: Self-pay | Admitting: *Deleted

## 2016-05-10 DIAGNOSIS — I513 Intracardiac thrombosis, not elsewhere classified: Secondary | ICD-10-CM

## 2016-05-10 DIAGNOSIS — I213 ST elevation (STEMI) myocardial infarction of unspecified site: Secondary | ICD-10-CM

## 2016-05-10 DIAGNOSIS — Z7901 Long term (current) use of anticoagulants: Secondary | ICD-10-CM

## 2016-05-10 LAB — POCT INR: INR: 2.2

## 2016-05-10 MED FILL — ATORVASTATIN 80 MG TABLET: 80 | 30 days supply | Qty: 30 | Fill #1

## 2016-05-12 ENCOUNTER — Encounter: Payer: Self-pay | Admitting: Vascular Surgery

## 2016-05-16 ENCOUNTER — Other Ambulatory Visit: Payer: Self-pay | Admitting: *Deleted

## 2016-05-16 ENCOUNTER — Telehealth: Payer: Self-pay | Admitting: Cardiovascular Disease

## 2016-05-16 MED ORDER — CLOPIDOGREL BISULFATE 75 MG PO TABS: 75.0000 mg | ORAL_TABLET | Freq: Every day | ORAL | 2 refills | Status: DC

## 2016-05-16 MED ORDER — CARVEDILOL 6.25 MG PO TABS: 6.2500 mg | ORAL_TABLET | Freq: Two times a day (BID) | ORAL | 2 refills | Status: DC

## 2016-05-16 MED FILL — CARVEDILOL 6.25 MG TABLET: 6.25 | 90 days supply | Qty: 180 | Fill #0

## 2016-05-16 MED FILL — CLOPIDOGREL 75 MG TABLET: 75 | 90 days supply | Qty: 90 | Fill #0

## 2016-05-17 ENCOUNTER — Encounter: Payer: Self-pay | Admitting: *Deleted

## 2016-05-17 ENCOUNTER — Ambulatory Visit (INDEPENDENT_AMBULATORY_CARE_PROVIDER_SITE_OTHER): Payer: Self-pay | Admitting: Vascular Surgery

## 2016-05-17 ENCOUNTER — Encounter: Payer: Self-pay | Admitting: Vascular Surgery

## 2016-05-17 ENCOUNTER — Ambulatory Visit (INDEPENDENT_AMBULATORY_CARE_PROVIDER_SITE_OTHER): Payer: Self-pay | Admitting: *Deleted

## 2016-05-17 VITALS — BP 119/82 | HR 88 | Temp 97.1°F | Ht 68.0 in | Wt 182.0 lb

## 2016-05-17 DIAGNOSIS — Z7901 Long term (current) use of anticoagulants: Secondary | ICD-10-CM

## 2016-05-17 DIAGNOSIS — Z9889 Other specified postprocedural states: Secondary | ICD-10-CM

## 2016-05-17 DIAGNOSIS — I213 ST elevation (STEMI) myocardial infarction of unspecified site: Secondary | ICD-10-CM

## 2016-05-17 DIAGNOSIS — I513 Intracardiac thrombosis, not elsewhere classified: Secondary | ICD-10-CM

## 2016-05-17 LAB — POCT INR: INR: 2.1

## 2016-05-17 NOTE — Progress Notes (Signed)
   Patient name: Troy Yu MRN: [id number] DOB: 1961/02/18 Sex: male  REASON FOR VISIT: Follow-up right leg fasciotomies  HPI: Troy Yu is a 55 y.o. male is here today for CT follow-up of his fasciotomies on the lateral aspect of his right lower extremity. He is walking today without crutches. He continues to have a foot drop. He is treating these himself with cleaning with saline and then hydrogel dressings daily  Current Outpatient Prescriptions  Medication Sig Dispense Refill  . aspirin EC 81 MG EC tablet Take 1 tablet (81 mg total) by mouth daily. 30 tablet 1  . atorvastatin (LIPITOR) 80 MG tablet Take 0.5 tablets (40 mg total) by mouth daily at 6 PM. 30 tablet 1  . carvedilol (COREG) 6.25 MG tablet Take 1 tablet (6.25 mg total) by mouth 2 (two) times daily with a meal. 180 tablet 2  . clopidogrel (PLAVIX) 75 MG tablet Take 1 tablet (75 mg total) by mouth daily with breakfast. 90 tablet 2  . ibuprofen (ADVIL,MOTRIN) 200 MG tablet Take 400 mg by mouth every 6 (six) hours as needed (for pain.).    Marland Kitchen insulin glargine (LANTUS) 100 UNIT/ML injection Inject 0.2 mLs (20 Units total) into the skin at bedtime. 10 mL 11  . warfarin (COUMADIN) 5 MG tablet Take as directed by coumadin clinic 50 tablet 3  . lisinopril (PRINIVIL,ZESTRIL) 2.5 MG tablet Take 1 tablet (2.5 mg total) by mouth daily. (Patient not taking: Reported on 04/04/2016) 30 tablet 6   No current facility-administered medications for this visit.      PHYSICAL EXAM: Vitals:   05/17/16 1315  BP: 119/82  Pulse: 88  Temp: 97.1 F (36.2 C)  TempSrc: Oral  SpO2: 99%  Weight: 182 lb (82.6 kg)  Height: 5\' 8"  (1.727 m)    GENERAL: The patient is a well-nourished male, in no acute distress. The vital signs are documented above. He does have a 2-3+ dorsalis pedis pulse. His right groin incision is well-healed with typical healing ridge. He does have very nice closure with continued  contracture of his open area with excellent granulating granulation tissue on the medial and lateral aspects of his fasciotomy  MEDICAL ISSUES: Stable overall. We will continue his local care. I will see him again in 4 weeks for continued follow-up. He requested a release to work for 4 weeks from now with no prolonged standing. I filled out the paperwork.   Larina Earthly, MD FACS [facility] Tel [phone] Pager [phone]

## 2016-05-18 ENCOUNTER — Encounter: Payer: Self-pay | Admitting: Cardiovascular Disease

## 2016-05-18 NOTE — Telephone Encounter (Signed)
No note needed  As we were speaking patient received a call from pharmacy saying medication was ready .Marland Kitchen Closed Encounter

## 2016-05-24 ENCOUNTER — Ambulatory Visit (INDEPENDENT_AMBULATORY_CARE_PROVIDER_SITE_OTHER): Payer: Self-pay | Admitting: Pharmacist

## 2016-05-24 DIAGNOSIS — I213 ST elevation (STEMI) myocardial infarction of unspecified site: Secondary | ICD-10-CM

## 2016-05-24 DIAGNOSIS — I513 Intracardiac thrombosis, not elsewhere classified: Secondary | ICD-10-CM

## 2016-05-24 DIAGNOSIS — Z7901 Long term (current) use of anticoagulants: Secondary | ICD-10-CM

## 2016-05-24 LAB — POCT INR: INR: 2.7

## 2016-05-25 ENCOUNTER — Encounter: Payer: Self-pay | Admitting: Family

## 2016-05-26 ENCOUNTER — Ambulatory Visit: Payer: Self-pay | Admitting: Family

## 2016-06-01 ENCOUNTER — Encounter: Payer: Self-pay | Admitting: Cardiovascular Disease

## 2016-06-01 ENCOUNTER — Ambulatory Visit (INDEPENDENT_AMBULATORY_CARE_PROVIDER_SITE_OTHER): Payer: Self-pay | Admitting: Cardiovascular Disease

## 2016-06-01 ENCOUNTER — Ambulatory Visit (INDEPENDENT_AMBULATORY_CARE_PROVIDER_SITE_OTHER): Payer: Self-pay | Admitting: *Deleted

## 2016-06-01 VITALS — BP 94/70 | HR 74 | Ht 68.0 in | Wt 185.0 lb

## 2016-06-01 DIAGNOSIS — I255 Ischemic cardiomyopathy: Secondary | ICD-10-CM

## 2016-06-01 DIAGNOSIS — I513 Intracardiac thrombosis, not elsewhere classified: Secondary | ICD-10-CM

## 2016-06-01 DIAGNOSIS — Z7901 Long term (current) use of anticoagulants: Secondary | ICD-10-CM

## 2016-06-01 DIAGNOSIS — R739 Hyperglycemia, unspecified: Secondary | ICD-10-CM

## 2016-06-01 DIAGNOSIS — I259 Chronic ischemic heart disease, unspecified: Secondary | ICD-10-CM

## 2016-06-01 LAB — POCT INR: INR: 2.9

## 2016-06-01 NOTE — Patient Instructions (Addendum)
Medication Instructions:  Your physician recommends that you continue on your current medications as directed. Please refer to the Current Medication list given to you today.  Labwork: Your physician recommends that you have lab work today - Hgb A1c  Testing/Procedures: Your physician has requested that you have an echocardiogram in 3 months. Echocardiography is a painless test that uses sound waves to create images of your heart. It provides your doctor with information about the size and shape of your heart and how well your heart's chambers and valves are working. This procedure takes approximately one hour. There are no restrictions for this procedure.  Follow-Up: Your physician wants you to follow-up in: 3 months with Dr. Eden Emms.    If you need a refill on your cardiac medications before your next appointment, please call your pharmacy.

## 2016-06-01 NOTE — Progress Notes (Signed)
CARDIOLOGY OFFICE NOTE  Date:  06/01/2016    Troy Yu Date of Birth: 09/27/1960 Medical Record #[id number]  PCP:  Sid FalconKALISH, MICHAEL J, MD  Cardiologist:  Eden EmmsNishan  Chief Complaint  Patient presents with  . Ischemic heart disease  . Cardiomyopathy    History of Present Illness: Troy PentonJamal Yu is a 55 y.o. male who presents today for a follow up    Presented in June to the ER with chest pain and right leg pain.   Initial evaluation includes evaluation by cardiology who noted subacute anterior MI on EKG. No prior history of CAD, PVD, angina or claudication. Was under more stress when his office roof leaked and had been fasting 30 days prior for religious purposes.   He was admitted with subacute anterior MI complicated by LV thrombus and right femoral artery embolism - s/p embolectomy and R calf fasciotomy for compartment syndrome. Underwent PCI. This was complicated by post operative anemia and has received several units of PRBCs. Heparin discontinued as INR became therapeutic on coumadin. Patient refused plastic surgery intervention.During this hospitalization he has also refused to work with physical therapy and refused SNF.  Wound was evaluated by Dr. Arbie CookeyEarly of vascular surgery on 03/15/2016, reporting "continues to have the demarcation of full-thickness loss over the lateral area of the skin closure. No infection with eschar present. Medial wound with good granulating base and minimal debris present. " Dr Arbie CookeyEarly had recommended on multiple occasions plastic surgery consultation for evaluation of split thickness skin graft which he has continued to decline. He reported to Dr. Arbie CookeyEarly not wanting anyone "cutting on his body anymore." Dr Arbie CookeyEarly felt that he could eventually close wound but that this would take months. On 03/16/2016 having declined further evaluation by plastic surgery Dr Early felt that he was stable for discharge from a vascular standpoint.   For his cardiac  issues - he did undergo cath and PCI  Had DES to distal RCA and Mid LAD EF 30-35% large apical thrombus  Has DM and does not want insulin. Has not been compliant with his beta blocker and ACE makes him feel tired    Past Medical History:  Diagnosis Date  . Diabetes (HCC)   . LV (left ventricular) mural thrombus   . NSTEMI (non-ST elevated myocardial infarction) [facility](HCC)     Past Surgical History:  Procedure Laterality Date  . APPLICATION OF WOUND VAC Right 02/22/2016   Procedure: APPLICATION OF Negative Pressure Dressing Right Lower Leg;  Surgeon: Fransisco HertzBrian L Chen, MD;  [facility];  Service: Vascular;  Laterality: Right;  . APPLICATION OF WOUND VAC Right 02/26/2016   Procedure: APPLICATION OF WOUND VAC, RIGHT MEDIAL LOWER LEG FASCIOTOMY SITE;  Surgeon: Larina Earthlyodd F Early, MD;  [facility];  Service: Vascular;  Laterality: Right;  . CARDIAC CATHETERIZATION N/A 03/09/2016   Procedure: Coronary Stent Intervention;  Surgeon: Iran OuchMuhammad A Arida, MD;  Location: [facility] INVASIVE CV LAB;  Service: Cardiovascular;  Laterality: N/A;  distal RCA mid LAD  . CARDIAC CATHETERIZATION N/A 03/09/2016   Procedure: Intravascular Pressure Wire/FFR Study;  Surgeon: Iran OuchMuhammad A Arida, MD;  [facility];  Service: Cardiovascular;  Laterality: N/A;  mid LAD  . CARDIAC CATHETERIZATION N/A 03/09/2016   Procedure: Left Heart Cath and Coronary Angiography;  Surgeon: Iran OuchMuhammad A Arida, MD;  Location: [facility] INVASIVE CV LAB;  Service: Cardiovascular;  Laterality: N/A;  . EMBOLECTOMY Right 02/21/2016   Procedure: EMBOLECTOMY RIGHT COMMON FEMORAL ARTERY; FOUR COMPARTMENT FASCIOTOMY;  Surgeon:  Fransisco Hertz, MD;  Location: [facility] OR;  Service: Vascular;  Laterality: Right;  . FASCIOTOMY CLOSURE Right 02/26/2016   Procedure: RIGHT LATERAL LOWER LEG FASCIOTOMY CLOSURE;  Surgeon: Larina Earthly, MD;  Location: [facility] OR;  Service: Vascular;  Laterality: Right;  . HEMATOMA EVACUATION Right 02/22/2016   Procedure: EVACUATION HEMATOMA With  Placement of Negative Pressure Dressing;  Surgeon: Fransisco Hertz, MD;  Location: [facility] OR;  Service: Vascular;  Laterality: Right;     Medications: Current Outpatient Prescriptions  Medication Sig Dispense Refill  . atorvastatin (LIPITOR) 40 MG tablet Take 40 mg by mouth daily.    . carvedilol (COREG) 6.25 MG tablet Take 1 tablet (6.25 mg total) by mouth 2 (two) times daily with a meal. 180 tablet 2  . clopidogrel (PLAVIX) 75 MG tablet Take 1 tablet (75 mg total) by mouth daily with breakfast. 90 tablet 2  . ibuprofen (ADVIL,MOTRIN) 200 MG tablet Take 400 mg by mouth every 6 (six) hours as needed (for pain.).    Marland Kitchen LANTUS 100 UNIT/ML injection Inject 20 Units into the muscle daily as needed for high blood sugar.  11  . warfarin (COUMADIN) 5 MG tablet Take as directed by coumadin clinic 50 tablet 3   No current facility-administered medications for this visit.     Allergies: No Known Allergies  Social History: The patient  reports that he has never smoked. He has never used smokeless tobacco. He reports that he does not drink alcohol or use drugs.   Family History: The patient's family history includes Heart Problems in his father and mother.   Review of Systems: Please see the history of present illness.   Otherwise, the review of systems is positive for none.   All other systems are reviewed and negative.   Physical Exam: VS:  BP 94/70   Pulse 74   Ht 5\' 8"  (1.727 m)   Wt 83.9 kg (185 lb)   SpO2 97%   BMI 28.13 kg/m  .  BMI Body mass index is 28.13 kg/m.  Wt Readings from Last 3 Encounters:  06/01/16 83.9 kg (185 lb)  05/17/16 82.6 kg (182 lb)  04/26/16 81.2 kg (179 lb)    General: Alert. Very argumentative. He is alert and in no acute distress. His weight is down 5 more pounds.  He was subsequently examined by Dr. Eden Emms.  HEENT: Normal.  Neck: Supple, no JVD, carotid bruits, or masses noted.  Cardiac:  Rhythm is regular. No edema. The right lower leg is wrapped.    Respiratory:  Lungs are clear to auscultation bilaterally with normal work of breathing.  GI: Soft and nontender.  MS: No deformity or atrophy. Gait not tested.   Skin: Warm and dry. Color is normal.  Neuro:  Strength and sensation are intact and no gross focal deficits noted.  Psych: Alert, appropriate and with normal affect. RLE wrapped with plus 3 DP/PT good capillary refill   LABORATORY DATA:  EKG:  EKG is not ordered today.  Lab Results  Component Value Date   WBC 7.1 04/04/2016   HGB 11.6 (L) 04/04/2016   HCT 36.3 (L) 04/04/2016   PLT 402 (H) 04/04/2016   GLUCOSE 264 (H) 04/04/2016   CHOL 116 02/23/2016   TRIG 130 02/23/2016   HDL 30 (L) 02/23/2016   LDLCALC 60 02/23/2016   ALT 23 04/04/2016   AST 20 04/04/2016   NA 136 04/04/2016   K 4.3 04/04/2016   CL 101 04/04/2016   CREATININE  0.90 04/04/2016   BUN 13 04/04/2016   CO2 24 04/04/2016   TSH 3.200 03/03/2016   INR 2.9 06/01/2016   HGBA1C 9.2 (H) 02/23/2016    BNP (last 3 results)  Recent Labs  03/21/16 1118  BNP 80.2    ProBNP (last 3 results) No results for input(s): PROBNP in the last 8760 hours.   Other Studies Reviewed Today:  Cardiac Cath Conclusion from 02/2016    Prox RCA lesion, 10% stenosed.  Dist LAD lesion, 60% stenosed.  Dist RCA lesion, 90% stenosed. Post intervention, there is a 0% residual stenosis.  Mid LAD lesion, 60% stenosed. Post intervention, there is a 0% residual stenosis.  1. Significant 2 vessel coronary artery disease involving the distal right coronary artery and mid LAD. The culprit for recent myocardial infarction seems to be the mid LAD stenosis which appears to be thrombotic and hazy in spite of being only 60% in severity. However, this was significant by FFR.  2. Moderately elevated left ventricular end-diastolic pressure. LV angiography was not performed due to LV thrombus.  3. Successful angioplasty and drug-eluting stent placement to the distal right  coronary artery and mid left anterior descending artery.   Recommendations: Recommend treatment with aspirin, Plavix and warfarin for one month. After one month, aspirin can be discontinued. Heparin can be resumed today 8 hours after sheath pull. Warfarin can be started. I switched from propranolol to carvedilol. Continue treatment for cardiomyopathy and add an ACE inhibitor or ARB before hospital discharge. There was residual 60% distal LAD stenosis which was left to be treated medically.   Echo Study Conclusions from 01/2016  - Left ventricle: Anteroapical infarct with large thrombus burden at apex. Mobile and high embolic potential. The cavity size was severely dilated. Wall thickness was normal. Systolic function was moderately to severely reduced. The estimated ejection fraction was in the range of 30% to 35%. - Atrial septum: No defect or patent foramen ovale was identified.  Assessment/Plan: 1. Anterior MI - with associated LV dysfunction. Cardiac catheterization with DES to distal RCA and Mid LAD. Residual CAD noted. Would plan to treat medically. Currently on triple anticoagulation - to stop aspirin after one month of therapy - this will be after August 12th. Try to add low dose ACE again. The patient was subsequently seen with Dr. Eden Emms due to his argumentative state.   2. Ischemic CM - with EF of 30 to 35%. Last BUN/creatine was normal. Have tried to add ACE but he has stopped due to his soft BP. Will try to add again.   3. LV (left ventricular) mural thrombus (HCC) with embolization of right femoral artery resulting in limb ischemia s/p thrombectomy and fasciotomy with post operative course complicated by hematoma and bleeding s/p evacuation of hematoma with application of a VAC in the setting of therapeutic anti-coagulation. Seen by Dr. Arbie Cookey. Has refused plastic surgery intervention. Family doing dressing changes.   4. Symptomatic anemia/ Postoperative anemia/  Anemia secondary to blood loss -During hospitalization, hemoglobin dropped to as low as 4.7 and he required many prbc transfusions.  Received a total of 11 u PRBCs this past admission suggested taking iron   Lab Results  Component Value Date   HGBA1C 9.2 (H) 02/23/2016     5. Newly diagnosed diabetes mellitus/Hyperglycemia -Hemoglobin A1c 9.2.  -Discharged on Lantus 20 units subcutaneous daily but he is not taking. Unfortunately, this will impact his healing of his leg as well as his cardiac status going forward. He has still  not got a PCP arranged - needs to go to [employer] - this was reitered once again today. Check A1c today   6. PVD:  Last seen by Dr Early post fasciotomy with delayed healing. Still with foot drop. F/u with [facility] latter this month   Abdominal pain  CT abd shows cholelithiasis with fingings concerning for early acute cholecystitis.  -US abdomen ordered, which showed mildly dilated gall bladder, with a 9 mm gall bladder stone impacted in the necko f the gall bladder, no pericholecystic fluid no wall thickening. Negative murphy's sign. Will have to monitor for now. Not a candidate for any type of surgical intervention at this time. F/U US with mildly distended GB 46mm stone impacted in neck No acute Murphy's sign or GB thickening and no pericholecystic fluid     Charlton Haws

## 2016-06-02 ENCOUNTER — Encounter: Payer: Self-pay | Admitting: Cardiovascular Disease

## 2016-06-02 LAB — HEMOGLOBIN A1C
Hgb A1c MFr Bld: 5.9 % — ABNORMAL HIGH (ref <5.7)
Mean Plasma Glucose: 123 mg/dL

## 2016-06-03 ENCOUNTER — Encounter: Payer: Self-pay | Admitting: Family

## 2016-06-07 ENCOUNTER — Ambulatory Visit (INDEPENDENT_AMBULATORY_CARE_PROVIDER_SITE_OTHER): Payer: Self-pay | Admitting: Family

## 2016-06-07 ENCOUNTER — Encounter: Payer: Self-pay | Admitting: Family

## 2016-06-07 VITALS — BP 97/72 | HR 88 | Temp 97.7°F | Resp 20 | Ht 68.0 in | Wt 183.2 lb

## 2016-06-07 DIAGNOSIS — Z9889 Other specified postprocedural states: Secondary | ICD-10-CM

## 2016-06-07 DIAGNOSIS — I779 Disorder of arteries and arterioles, unspecified: Secondary | ICD-10-CM

## 2016-06-07 MED FILL — WARFARIN SODIUM 5 MG TABLET: 5 | 30 days supply | Qty: 50 | Fill #1

## 2016-06-07 NOTE — Patient Instructions (Signed)
Peripheral Vascular Disease Peripheral vascular disease (PVD) is a disease of the blood vessels that are not part of your heart and brain. A simple term for PVD is poor circulation. In most cases, PVD narrows the blood vessels that carry blood from your heart to the rest of your body. This can result in a decreased supply of blood to your arms, legs, and internal organs, like your stomach or kidneys. However, it most often affects a person's lower legs and feet. There are two types of PVD.  Organic PVD. This is the more common type. It is caused by damage to the structure of blood vessels.  Functional PVD. This is caused by conditions that make blood vessels contract and tighten (spasm). Without treatment, PVD tends to get worse over time. PVD can also lead to acute ischemic limb. This is when an arm or limb suddenly has trouble getting enough blood. This is a medical emergency. CAUSES Each type of PVD has many different causes. The most common cause of PVD is buildup of a fatty material (plaque) inside of your arteries (atherosclerosis). Small amounts of plaque can break off from the walls of the blood vessels and become lodged in a smaller artery. This blocks blood flow and can cause acute ischemic limb. Other common causes of PVD include:  Blood clots that form inside of blood vessels.  Injuries to blood vessels.  Diseases that cause inflammation of blood vessels or cause blood vessel spasms.  Health behaviors and health history that increase your risk of developing PVD. RISK FACTORS  You may have a greater risk of PVD if you:  Have a family history of PVD.  Have certain medical conditions, including:  High cholesterol.  Diabetes.  High blood pressure (hypertension).  Coronary heart disease.  Past problems with blood clots.  Past injury, such as burns or a broken bone. These may have damaged blood vessels in your limbs.  Buerger disease. This is caused by inflamed blood  vessels in your hands and feet.  Some forms of arthritis.  Rare birth defects that affect the arteries in your legs.  Use tobacco.  Do not get enough exercise.  Are obese.  Are age 50 or older. SIGNS AND SYMPTOMS  PVD may cause many different symptoms. Your symptoms depend on what part of your body is not getting enough blood. Some common signs and symptoms include:  Cramps in your lower legs. This may be a symptom of poor leg circulation (claudication).  Pain and weakness in your legs while you are physically active that goes away when you rest (intermittent claudication).  Leg pain when at rest.  Leg numbness, tingling, or weakness.  Coldness in a leg or foot, especially when compared with the other leg.  Skin or hair changes. These can include:  Hair loss.  Shiny skin.  Pale or bluish skin.  Thick toenails.  Inability to get or maintain an erection (erectile dysfunction). People with PVD are more prone to developing ulcers and sores on their toes, feet, or legs. These may take longer than normal to heal. DIAGNOSIS Your health care provider may diagnose PVD from your signs and symptoms. The health care provider will also do a physical exam. You may have tests to find out what is causing your PVD and determine its severity. Tests may include:  Blood pressure recordings from your arms and legs and measurements of the strength of your pulses (pulse volume recordings).  Imaging studies using sound waves to take pictures of   the blood flow through your blood vessels (Doppler ultrasound).  Injecting a dye into your blood vessels before having imaging studies using:  X-rays (angiogram or arteriogram).  Computer-generated X-rays (CT angiogram).  A powerful electromagnetic field and a computer (magnetic resonance angiogram or MRA). TREATMENT Treatment for PVD depends on the cause of your condition and the severity of your symptoms. It also depends on your age. Underlying  causes need to be treated and controlled. These include long-lasting (chronic) conditions, such as diabetes, high cholesterol, and high blood pressure. You may need to first try making lifestyle changes and taking medicines. Surgery may be needed if these do not work. Lifestyle changes may include:  Quitting smoking.  Exercising regularly.  Following a low-fat, low-cholesterol diet. Medicines may include:  Blood thinners to prevent blood clots.  Medicines to improve blood flow.  Medicines to improve your blood cholesterol levels. Surgical procedures may include:  A procedure that uses an inflated balloon to open a blocked artery and improve blood flow (angioplasty).  A procedure to put in a tube (stent) to keep a blocked artery open (stent implant).  Surgery to reroute blood flow around a blocked artery (peripheral bypass surgery).  Surgery to remove dead tissue from an infected wound on the affected limb.  Amputation. This is surgical removal of the affected limb. This may be necessary in cases of acute ischemic limb that are not improved through medical or surgical treatments. HOME CARE INSTRUCTIONS  Take medicines only as directed by your health care provider.  Do not use any tobacco products, including cigarettes, chewing tobacco, or electronic cigarettes. If you need help quitting, ask your health care provider.  Lose weight if you are overweight, and maintain a healthy weight as directed by your health care provider.  Eat a diet that is low in fat and cholesterol. If you need help, ask your health care provider.  Exercise regularly. Ask your health care provider to suggest some good activities for you.  Use compression stockings or other mechanical devices as directed by your health care provider.  Take good care of your feet.  Wear comfortable shoes that fit well.  Check your feet often for any cuts or sores. SEEK MEDICAL CARE IF:  You have cramps in your legs  while walking.  You have leg pain when you are at rest.  You have coldness in a leg or foot.  Your skin changes.  You have erectile dysfunction.  You have cuts or sores on your feet that are not healing. SEEK IMMEDIATE MEDICAL CARE IF:  Your arm or leg turns cold and blue.  Your arms or legs become red, warm, swollen, painful, or numb.  You have chest pain or trouble breathing.  You suddenly have weakness in your face, arm, or leg.  You become very confused or lose the ability to speak.  You suddenly have a very bad headache or lose your vision.   This information is not intended to replace advice given to you by your health care provider. Make sure you discuss any questions you have with your health care provider.   Document Released: 09/22/2004 Document Revised: 09/05/2014 Document Reviewed: 01/23/2014 [employer] Interactive Patient Education 2016 [employer]  

## 2016-06-07 NOTE — Progress Notes (Signed)
[facility] [place]   CC: Follow up peripheral artery occlusive disease, pt and his attorney are requesting more documentation  History of Present Illness Troy Yu is a 55 y.o. male patient who is s/p open fasciotomies right lower extremity medial and lateral calf and closure of lateral fasciotomy and VAC placement medial fasciotomy on 02/26/16.  This is subsequent to right femoral embolectomy 5 days prior to this for acute embolus. He had fasciotomies that time. He did have some bleeding under this in the first day and was taken back to the operating room the following day for irrigation of the open wounds and extension of the skin incisions for the fasciotomy.  He continued to have a good flow to his right foot.  Dr. Arbie Cookey last evaluated patient on 05/17/16. At that time patient continued to have a foot drop.  He was addressing the fasciotomy sites himself by cleaning with saline and then hydrogel dressings daily. The patient had a 2-3+ dorsalis pedis pulse. His right groin incision was well-healed with typical healing ridge. He did have very nice closure with continued contracture of his open area with excellent granulating granulation tissue on the medial and lateral aspects of his fasciotomy The patient was stable overall. He was to continue his local care. Dr. Arbie Cookey was to see him again in 4 weeks for continued follow-up. The patient requested a release to work for 4 weeks from that time with no prolonged standing. Dr. Arbie Cookey filled out the paperwork.  The patient returns today with request for more documentation.  The patient has an appointment scheduled with Dr. Arbie Cookey on 06/14/16.   The patient reports occasional neuropathic type pain in his right foot. He does not indicate claudication symptoms with walking.   Pt Diabetic: Yes, A1C was 5.9 on 06/01/16 (review of records) Pt smoker: non-smoker  Pt meds include: Statin :Yes Betablocker: Yes ASA: No Other  anticoagulants/antiplatelets: warfarin, clopidogrel   Past Medical History:  Diagnosis Date  . Diabetes (HCC)   . LV (left ventricular) mural thrombus   . NSTEMI (non-ST elevated myocardial infarction) [facility])     Social History Social History  Substance Use Topics  . Smoking status: Never Smoker  . Smokeless tobacco: Never Used  . Alcohol use No    Family History Family History  Problem Relation Age of Onset  . Heart Problems Father   . Heart Problems Mother     Past Surgical History:  Procedure Laterality Date  . APPLICATION OF WOUND VAC Right 02/22/2016   Procedure: APPLICATION OF Negative Pressure Dressing Right Lower Leg;  Surgeon: Fransisco Hertz, MD;  Location: [facility] OR;  Service: Vascular;  Laterality: Right;  . APPLICATION OF WOUND VAC Right 02/26/2016   Procedure: APPLICATION OF WOUND VAC, RIGHT MEDIAL LOWER LEG FASCIOTOMY SITE;  Surgeon: Larina Earthly, MD;  Location: [facility] OR;  Service: Vascular;  Laterality: Right;  . CARDIAC CATHETERIZATION N/A 03/09/2016   Procedure: Coronary Stent Intervention;  Surgeon: Iran Ouch, MD;  Location: [facility] INVASIVE CV LAB;  Service: Cardiovascular;  Laterality: N/A;  distal RCA mid LAD  . CARDIAC CATHETERIZATION N/A 03/09/2016   Procedure: Intravascular Pressure Wire/FFR Study;  Surgeon: Iran Ouch, MD;  Location: [facility] INVASIVE CV LAB;  Service: Cardiovascular;  Laterality: N/A;  mid LAD  . CARDIAC CATHETERIZATION N/A 03/09/2016   Procedure: Left Heart Cath and Coronary Angiography;  Surgeon: Iran Ouch, MD;  Location: [facility] INVASIVE CV LAB;  Service: Cardiovascular;  Laterality: N/A;  .  EMBOLECTOMY Right 02/21/2016   Procedure: EMBOLECTOMY RIGHT COMMON FEMORAL ARTERY; FOUR COMPARTMENT FASCIOTOMY;  Surgeon: Fransisco Hertz, MD;  Location: [facility] OR;  Service: Vascular;  Laterality: Right;  . FASCIOTOMY CLOSURE Right 02/26/2016   Procedure: RIGHT LATERAL LOWER LEG FASCIOTOMY CLOSURE;  Surgeon: Larina Earthly, MD;  Location: [facility] OR;  Service: Vascular;   Laterality: Right;  . HEMATOMA EVACUATION Right 02/22/2016   Procedure: EVACUATION HEMATOMA With Placement of Negative Pressure Dressing;  Surgeon: Fransisco Hertz, MD;  Location: [facility] OR;  Service: Vascular;  Laterality: Right;    No Known Allergies  Current Outpatient Prescriptions  Medication Sig Dispense Refill  . atorvastatin (LIPITOR) 40 MG tablet Take 40 mg by mouth daily.    . carvedilol (COREG) 6.25 MG tablet Take 1 tablet (6.25 mg total) by mouth 2 (two) times daily with a meal. 180 tablet 2  . clopidogrel (PLAVIX) 75 MG tablet Take 1 tablet (75 mg total) by mouth daily with breakfast. 90 tablet 2  . ibuprofen (ADVIL,MOTRIN) 200 MG tablet Take 400 mg by mouth every 6 (six) hours as needed (for pain.).    Marland Kitchen LANTUS 100 UNIT/ML injection Inject 20 Units into the muscle daily as needed for high blood sugar.  11  . warfarin (COUMADIN) 5 MG tablet Take as directed by coumadin clinic 50 tablet 3   No current facility-administered medications for this visit.     ROS: See HPI for pertinent positives and negatives.   Physical Examination  Vitals:   06/07/16 1327  BP: 97/72  Pulse: 88  Resp: 20  Temp: 97.7 F (36.5 C)  TempSrc: Oral  SpO2: 98%  Weight: 183 lb 3.2 oz (83.1 kg)  Height: 5\' 8"  (1.727 m)   Body mass index is 27.86 kg/m.  General: A&O x 3, WDWN. Gait: slight limp Eyes: PERRLA. Pulmonary: Respirations are non labored, symmetric expansion Cardiac: regular rhythm and rate.                         VASCULAR EXAM: Aorta is not palpable. Radial pulses: palpable bilaterally  Extremities without ischemic changes, without Gangrene. Lateral and medial fasciotomy sites on right calf are healing and granulating well with no signs of infection. 1+ non pitting edema in right ankle and proximal aspect of foot. There is no drainage from the right calf lateral fasciotomy site, and scant drainage from the medial right calf fasciotomy site  at this time.                                                                                                            LE Pulses Right Left       FEMORAL   palpable  palpable         POPLITEAL  not palpable    not palpable       POSTERIOR TIBIAL   2+palpable    2+palpable        DORSALIS PEDIS      ANTERIOR TIBIAL  2+palpable   2+palpable  Abdomen: soft, NT, no palpable masses. Skin: no rashes, no ulcers, see Extremities. Musculoskeletal: no muscle wasting or atrophy.  Neurologic: A&O X 3; Appropriate Affect ; SENSATION: normal; MOTOR FUNCTION:  moving all extremities equally, motor strength 5/5 throughout. Speech is fluent/normal. CN 2-12 intact.   ASSESSMENT: Troy Yu is a 55 y.o. male who is s/p open fasciotomies right lower extremity medial and lateral calf and closure of lateral fasciotomy and VAC placement medial fasciotomy on 02/26/16.  This is subsequent to right femoral embolectomy 5 days prior to this for acute embolus. He had fasciotomies that time. He did have some bleeding under this in the first day and was taken back to the operating room the following day for irrigation of the open wounds and extension of the skin incisions for the fasciotomy.   He has no drainage from the right calf lateral fasciotomy site, and scant drainage from the medial right calf fasciotomy site at this time.  Both fasciotomy sites are healing and granulating well with no signs of infection.  There is minimal swelling in his right foot. He was advised to elevate his right foot above the level of his heart, right knee slightly flexed, overnight and 3 times during the day for 20 minutes to continue to minimize edema in his right foot.  His diabetes mellitus is now in good control with a documented A1C of 5.9 on 06/01/16. He reports that he has never used tobacco.  He is on a statin, beta blocker, warfarin, and clopidogrel.  He reports intermittent pain in his right foot that seems neuropathic in nature.  He has 2+ palpable  bilateral DP and PT pulses.   I discussed pt status with Dr. Arbie CookeyEarly. Second letter was composed at patient and his attorney request. Patient had already been given a first letter at his request.   Dr. Eden EmmsNishan has already composed a second letter for patient at patient and his attorney request.  PLAN:  Based on the patient's vascular studies and examination, and after discussing with Dr. Arbie CookeyEarly, pt will return to clinic in 1 month for re evaluation of fasciotomy sites, right calf, see Dr. Arbie CookeyEarly.  I discussed in depth with the patient the nature of atherosclerosis, and emphasized the importance of maximal medical management including strict control of blood pressure, blood glucose, and lipid levels, obtaining regular exercise, and continued cessation of smoking.  The patient is aware that without maximal medical management the underlying atherosclerotic disease process will progress, limiting the benefit of any interventions.  The patient was given information about PAD including signs, symptoms, treatment, what symptoms should prompt the patient to seek immediate medical care, and risk reduction measures to take.  Charisse MarchSuzanne Ewel Lona, RN, MSN, FNP-C [facility] [place] Office Phone: [phone]  Clinic MD: Early  06/07/16 1:36 PM

## 2016-06-14 ENCOUNTER — Ambulatory Visit: Payer: Self-pay | Admitting: Vascular Surgery

## 2016-06-22 ENCOUNTER — Ambulatory Visit (INDEPENDENT_AMBULATORY_CARE_PROVIDER_SITE_OTHER): Payer: Self-pay | Admitting: *Deleted

## 2016-06-22 DIAGNOSIS — Z7901 Long term (current) use of anticoagulants: Secondary | ICD-10-CM

## 2016-06-22 DIAGNOSIS — I513 Intracardiac thrombosis, not elsewhere classified: Secondary | ICD-10-CM

## 2016-06-22 LAB — POCT INR: INR: 1.5

## 2016-07-06 ENCOUNTER — Ambulatory Visit (INDEPENDENT_AMBULATORY_CARE_PROVIDER_SITE_OTHER): Payer: Self-pay | Admitting: *Deleted

## 2016-07-06 DIAGNOSIS — I513 Intracardiac thrombosis, not elsewhere classified: Secondary | ICD-10-CM

## 2016-07-06 DIAGNOSIS — Z7901 Long term (current) use of anticoagulants: Secondary | ICD-10-CM

## 2016-07-06 LAB — POCT INR: INR: 2.4

## 2016-07-08 ENCOUNTER — Other Ambulatory Visit: Payer: Self-pay | Admitting: *Deleted

## 2016-07-08 MED ORDER — ATORVASTATIN CALCIUM 40 MG PO TABS: 40.0000 mg | ORAL_TABLET | Freq: Every day | ORAL | 12 refills | Status: DC

## 2016-07-08 MED ORDER — WARFARIN SODIUM 5 MG PO TABS: ORAL_TABLET | ORAL | 3 refills | Status: DC

## 2016-07-08 MED FILL — ATORVASTATIN 40 MG TABLET: 40 | 30 days supply | Qty: 30 | Fill #0

## 2016-07-08 MED FILL — WARFARIN SODIUM 5 MG TABLET: 5 | 30 days supply | Qty: 50 | Fill #0

## 2016-07-08 NOTE — Telephone Encounter (Signed)
Okay to refill? Not sure if Dr Eden Emms is managing lipids. Please advise. Thanks, MI

## 2016-07-12 ENCOUNTER — Ambulatory Visit: Payer: Self-pay | Admitting: Vascular Surgery

## 2016-07-13 ENCOUNTER — Encounter: Payer: Self-pay | Admitting: Vascular Surgery

## 2016-07-26 ENCOUNTER — Encounter: Payer: Self-pay | Admitting: Vascular Surgery

## 2016-07-26 ENCOUNTER — Ambulatory Visit (INDEPENDENT_AMBULATORY_CARE_PROVIDER_SITE_OTHER): Payer: Self-pay | Admitting: Vascular Surgery

## 2016-07-26 VITALS — BP 116/70 | HR 72 | Temp 97.8°F | Resp 18 | Ht 68.0 in | Wt 192.0 lb

## 2016-07-26 DIAGNOSIS — Z9889 Other specified postprocedural states: Secondary | ICD-10-CM

## 2016-07-26 NOTE — Progress Notes (Signed)
[facility]  Patient name: Troy Yu MRN: [id number] DOB: 11/28/1960 Sex: male  REASON FOR VISIT: follow-up  HPI: Troy Yu is a 55 y.o. male who presents for follow-up of his right lower extremity fasciotomy wounds. He previously underwent a right femoral embolectomy and 4 compartment fasciotomies. He is now back at work. He is walking without assistance. He denies any pain or numbness in his right leg. He does complain of swelling in his right leg.  He states that he has gained 10 pounds in last month. He also reports that his blood pressure is been in the 110s and 120s. He continues on Coumadin and Plavix. He is being followed by Dr. Eden EmmsNishan for cardiology.  Past Medical History:  Diagnosis Date  . Diabetes (HCC)   . LV (left ventricular) mural thrombus   . NSTEMI (non-ST elevated myocardial infarction) (HCC)     Family History  Problem Relation Age of Onset  . Heart Problems Father   . Heart Problems Mother     SOCIAL HISTORY: Social History  Substance Use Topics  . Smoking status: Never Smoker  . Smokeless tobacco: Never Used  . Alcohol use No    No Known Allergies  Current Outpatient Prescriptions  Medication Sig Dispense Refill  . atorvastatin (LIPITOR) 40 MG tablet Take 1 tablet (40 mg total) by mouth daily. 30 tablet 12  . carvedilol (COREG) 6.25 MG tablet Take 1 tablet (6.25 mg total) by mouth 2 (two) times daily with a meal. 180 tablet 2  . clopidogrel (PLAVIX) 75 MG tablet Take 1 tablet (75 mg total) by mouth daily with breakfast. 90 tablet 2  . ibuprofen (ADVIL,MOTRIN) 200 MG tablet Take 400 mg by mouth every 6 (six) hours as needed (for pain.).    Marland Kitchen. warfarin (COUMADIN) 5 MG tablet Take as directed by coumadin clinic 50 tablet 3  . LANTUS 100 UNIT/ML injection Inject 20 Units into the muscle daily as needed for high blood sugar.  11   No current facility-administered medications for this visit.     REVIEW OF  SYSTEMS:  [X]  denotes positive finding, [ ]  denotes negative finding Cardiac  Comments:  Chest pain or chest pressure:    Shortness of breath upon exertion:    Short of breath when lying flat:    Irregular heart rhythm:        Vascular    Pain in calf, thigh, or hip brought on by ambulation:    Pain in feet at night that wakes you up from your sleep:     Blood clot in your veins:    Leg swelling:  x       Pulmonary    Oxygen at home:    Productive cough:     Wheezing:         Neurologic    Sudden weakness in arms or legs:     Sudden numbness in arms or legs:     Sudden onset of difficulty speaking or slurred speech:    Temporary loss of vision in one eye:     Problems with dizziness:         Gastrointestinal    Blood in stool:     Vomited blood:         Genitourinary    Burning when urinating:     Blood in urine:        Psychiatric    Major depression:         Hematologic  Bleeding problems:    Problems with blood clotting too easily:        Skin    Rashes or ulcers:        Constitutional    Fever or chills:      PHYSICAL EXAM: Vitals:   07/26/16 1605  BP: 116/70  Pulse: 72  Resp: 18  Temp: 97.8 F (36.6 C)  TempSrc: Oral  SpO2: 98%  Weight: 192 lb (87.1 kg)  Height: 5\' 8"  (1.727 m)    GENERAL: The patient is a well-nourished male, in no acute distress. The vital signs are documented above. VASCULAR: 2-3+ right DP pulse. Right lateral medial fasciotomy sites healed. PULMONARY: Nonlabored respiratory effort.  MUSCULOSKELETAL: 1-2+ right lower extremity edema. NEUROLOGIC: No focal weakness or paresthesias are detected. SKIN: There are no ulcers or rashes noted. PSYCHIATRIC: The patient has a normal affect.  MEDICAL ISSUES: Status post right femoral embolectomy, right lower extremity fasciotomies  The patient's right lower extremity fasciotomy sites have healed. He continues to have some swelling of his right leg. Discussed that this will  improve with time. Advised patient to elevate his legs when not ambulating. Encouraged him to stay active and ambulate daily. He will follow-up with [place] on an as-needed basis. He has follow-up with Dr. Eden Yu in January.     Maris Berger, PA-C [facility] [place]   I have examined the patient, reviewed and agree with above.  Gretta Began, MD 07/26/2016 4:50 PM

## 2016-07-27 ENCOUNTER — Ambulatory Visit (INDEPENDENT_AMBULATORY_CARE_PROVIDER_SITE_OTHER): Payer: Self-pay | Admitting: *Deleted

## 2016-07-27 DIAGNOSIS — Z7901 Long term (current) use of anticoagulants: Secondary | ICD-10-CM

## 2016-07-27 DIAGNOSIS — I513 Intracardiac thrombosis, not elsewhere classified: Secondary | ICD-10-CM

## 2016-07-27 LAB — POCT INR: INR: 1.7

## 2016-08-10 MED FILL — ATORVASTATIN 40 MG TABLET: 40 | 30 days supply | Qty: 30 | Fill #1

## 2016-08-16 MED FILL — WARFARIN SODIUM 5 MG TABLET: 5 | 30 days supply | Qty: 50 | Fill #1

## 2016-08-16 MED FILL — CARVEDILOL 6.25 MG TABLET: 6.25 | 90 days supply | Qty: 180 | Fill #1

## 2016-08-16 MED FILL — CLOPIDOGREL 75 MG TABLET: 75 | 90 days supply | Qty: 90 | Fill #1

## 2016-09-05 ENCOUNTER — Ambulatory Visit (INDEPENDENT_AMBULATORY_CARE_PROVIDER_SITE_OTHER): Payer: BLUE CROSS/BLUE SHIELD | Admitting: *Deleted

## 2016-09-05 ENCOUNTER — Other Ambulatory Visit: Payer: Self-pay

## 2016-09-05 ENCOUNTER — Ambulatory Visit (HOSPITAL_COMMUNITY): Payer: BLUE CROSS/BLUE SHIELD | Attending: Cardiology

## 2016-09-05 DIAGNOSIS — I501 Left ventricular failure: Secondary | ICD-10-CM | POA: Insufficient documentation

## 2016-09-05 DIAGNOSIS — I351 Nonrheumatic aortic (valve) insufficiency: Secondary | ICD-10-CM | POA: Diagnosis not present

## 2016-09-05 DIAGNOSIS — I259 Chronic ischemic heart disease, unspecified: Secondary | ICD-10-CM

## 2016-09-05 DIAGNOSIS — I513 Intracardiac thrombosis, not elsewhere classified: Secondary | ICD-10-CM | POA: Diagnosis not present

## 2016-09-05 DIAGNOSIS — R9439 Abnormal result of other cardiovascular function study: Secondary | ICD-10-CM | POA: Insufficient documentation

## 2016-09-05 DIAGNOSIS — Z7901 Long term (current) use of anticoagulants: Secondary | ICD-10-CM | POA: Diagnosis not present

## 2016-09-05 DIAGNOSIS — I255 Ischemic cardiomyopathy: Secondary | ICD-10-CM

## 2016-09-05 DIAGNOSIS — I340 Nonrheumatic mitral (valve) insufficiency: Secondary | ICD-10-CM | POA: Insufficient documentation

## 2016-09-05 LAB — POCT INR: INR: 2.2

## 2016-09-05 MED FILL — ATORVASTATIN 40 MG TABLET: 40 | 30 days supply | Qty: 30 | Fill #2

## 2016-09-07 ENCOUNTER — Telehealth: Payer: Self-pay | Admitting: Cardiovascular Disease

## 2016-09-07 NOTE — Telephone Encounter (Addendum)
Called pt to make appt with Ep to discuss poss AICD. Pt states he doesn't think he needs one, and more testing needs to be done. Has questions, I explained that is what the EP consult is for, to answer questions and to determine if he is a candidate for implant. He refused and insists on speaking with Dr. Eden Emms. (I was going to offer Taylor 09-09-16.)

## 2016-09-08 NOTE — Telephone Encounter (Signed)
Can f/u with me to discuss

## 2016-09-20 MED FILL — WARFARIN SODIUM 5 MG TABLET: 5 | 30 days supply | Qty: 50 | Fill #2

## 2016-09-21 NOTE — Telephone Encounter (Signed)
Patient called back and made an appointment for Monday with Dr. Eden Emms.

## 2016-09-21 NOTE — Telephone Encounter (Signed)
Left message for patient to call back  

## 2016-09-26 ENCOUNTER — Ambulatory Visit: Payer: Self-pay | Admitting: Cardiovascular Disease

## 2016-10-10 MED FILL — ATORVASTATIN 40 MG TABLET: 40 | 30 days supply | Qty: 30 | Fill #3
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
## 2016-10-27 ENCOUNTER — Ambulatory Visit (INDEPENDENT_AMBULATORY_CARE_PROVIDER_SITE_OTHER): Payer: BLUE CROSS/BLUE SHIELD | Admitting: Cardiovascular Disease

## 2016-10-27 ENCOUNTER — Encounter: Payer: Self-pay | Admitting: Cardiovascular Disease

## 2016-10-27 ENCOUNTER — Ambulatory Visit (INDEPENDENT_AMBULATORY_CARE_PROVIDER_SITE_OTHER): Payer: BLUE CROSS/BLUE SHIELD | Admitting: *Deleted

## 2016-10-27 VITALS — BP 108/56 | HR 73 | Ht 68.0 in | Wt 201.1 lb

## 2016-10-27 DIAGNOSIS — Z7901 Long term (current) use of anticoagulants: Secondary | ICD-10-CM

## 2016-10-27 DIAGNOSIS — I252 Old myocardial infarction: Secondary | ICD-10-CM | POA: Diagnosis not present

## 2016-10-27 DIAGNOSIS — I513 Intracardiac thrombosis, not elsewhere classified: Secondary | ICD-10-CM | POA: Diagnosis not present

## 2016-10-27 LAB — POCT INR: INR: 2.5

## 2016-10-27 MED FILL — WARFARIN SODIUM 5 MG TABLET: 5 | 30 days supply | Qty: 50 | Fill #3

## 2016-10-27 NOTE — Patient Instructions (Addendum)
Medication Instructions:  Your physician recommends that you continue on your current medications as directed. Please refer to the Current Medication list given to you today.  Labwork: NONE  Testing/Procedures: NONE  Follow-Up: Your physician wants you to follow-up in: July with Dr. Nishan.    If you need a refill on your cardiac medications before your next appointment, please call your pharmacy.    

## 2016-11-14 MED FILL — CARVEDILOL 6.25 MG TABLET: 6.25 | 90 days supply | Qty: 180 | Fill #2

## 2016-11-14 MED FILL — ATORVASTATIN 40 MG TABLET: 40 | 30 days supply | Qty: 30 | Fill #4 | Status: TO

## 2016-11-14 MED FILL — CLOPIDOGREL 75 MG TABLET: 75 | 90 days supply | Qty: 90 | Fill #2

## 2016-11-16 DIAGNOSIS — E113393 Type 2 diabetes mellitus with moderate nonproliferative diabetic retinopathy without macular edema, bilateral: Secondary | ICD-10-CM

## 2016-11-16 DIAGNOSIS — Z91199 Patient's noncompliance with other medical treatment and regimen due to unspecified reason: Secondary | ICD-10-CM | POA: Insufficient documentation

## 2016-11-16 DIAGNOSIS — I252 Old myocardial infarction: Secondary | ICD-10-CM | POA: Insufficient documentation

## 2016-11-16 DIAGNOSIS — I5022 Chronic systolic (congestive) heart failure: Secondary | ICD-10-CM | POA: Insufficient documentation

## 2016-11-16 DIAGNOSIS — E7800 Pure hypercholesterolemia, unspecified: Secondary | ICD-10-CM | POA: Insufficient documentation

## 2016-11-16 DIAGNOSIS — I236 Thrombosis of atrium, auricular appendage, and ventricle as current complications following acute myocardial infarction: Secondary | ICD-10-CM | POA: Insufficient documentation

## 2016-11-24 ENCOUNTER — Other Ambulatory Visit: Payer: Self-pay | Admitting: *Deleted

## 2016-11-24 ENCOUNTER — Ambulatory Visit (INDEPENDENT_AMBULATORY_CARE_PROVIDER_SITE_OTHER): Payer: BLUE CROSS/BLUE SHIELD | Admitting: *Deleted

## 2016-11-24 DIAGNOSIS — Z7901 Long term (current) use of anticoagulants: Secondary | ICD-10-CM

## 2016-11-24 DIAGNOSIS — I513 Intracardiac thrombosis, not elsewhere classified: Secondary | ICD-10-CM | POA: Diagnosis not present

## 2016-11-24 LAB — POCT INR: INR: 1.3

## 2016-11-24 MED ORDER — WARFARIN SODIUM 5 MG PO TABS: ORAL_TABLET | ORAL | 0 refills | Status: DC

## 2016-11-24 MED FILL — WARFARIN SODIUM 5 MG TABLET: 5 | 90 days supply | Qty: 150 | Fill #0

## 2017-01-19 ENCOUNTER — Ambulatory Visit (INDEPENDENT_AMBULATORY_CARE_PROVIDER_SITE_OTHER): Payer: BLUE CROSS/BLUE SHIELD | Admitting: *Deleted

## 2017-01-19 DIAGNOSIS — I513 Intracardiac thrombosis, not elsewhere classified: Secondary | ICD-10-CM

## 2017-01-19 DIAGNOSIS — Z7901 Long term (current) use of anticoagulants: Secondary | ICD-10-CM

## 2017-01-19 LAB — POCT INR: INR: 1.8

## 2017-02-14 ENCOUNTER — Other Ambulatory Visit: Payer: Self-pay | Admitting: Cardiovascular Disease

## 2017-02-14 MED FILL — CLOPIDOGREL 75 MG TABLET: 75 | 90 days supply | Qty: 90 | Fill #0

## 2017-02-16 ENCOUNTER — Ambulatory Visit (INDEPENDENT_AMBULATORY_CARE_PROVIDER_SITE_OTHER): Payer: BLUE CROSS/BLUE SHIELD | Admitting: *Deleted

## 2017-02-16 ENCOUNTER — Other Ambulatory Visit: Payer: Self-pay | Admitting: Cardiovascular Disease

## 2017-02-16 DIAGNOSIS — I513 Intracardiac thrombosis, not elsewhere classified: Secondary | ICD-10-CM | POA: Diagnosis not present

## 2017-02-16 DIAGNOSIS — Z7901 Long term (current) use of anticoagulants: Secondary | ICD-10-CM

## 2017-02-16 LAB — POCT INR: INR: 2.2

## 2017-02-16 MED FILL — CARVEDILOL 6.25 MG TABLET: 6.25 | 90 days supply | Qty: 180 | Fill #0

## 2017-02-22 ENCOUNTER — Encounter: Payer: Self-pay | Admitting: Cardiovascular Disease

## 2017-02-23 ENCOUNTER — Telehealth: Payer: Self-pay | Admitting: Cardiovascular Disease

## 2017-02-23 NOTE — Telephone Encounter (Signed)
Pt calling in to make an appt w/ Nishan for July (routine follow up from November 2017).  I informed pt I did not see any openings for July w/ Nishan.  Explained that I would forward to [facility], Nishan's nurse, to address fitting pt in on his schedule.  Pt understands Pam will follow up with him when she returns to the office.

## 2017-02-23 NOTE — Telephone Encounter (Signed)
New message    Pt sent Mychart message about appt?  Troy Yu it has been a year since he has been here?  Patient was seen in 10/2016 with Dr Eden Emms,  He said he had a stent put in a year ago and the Coumadin people said he has to have a appt to have his medication adjusted?   Does pt need appt?

## 2017-02-24 NOTE — Telephone Encounter (Signed)
Left message for patient to call back  

## 2017-03-07 NOTE — Telephone Encounter (Signed)
Make patient an appointment for August.

## 2017-03-17 ENCOUNTER — Other Ambulatory Visit: Payer: Self-pay | Admitting: *Deleted

## 2017-03-17 MED ORDER — WARFARIN SODIUM 5 MG PO TABS: ORAL_TABLET | ORAL | 0 refills | Status: DC

## 2017-03-17 MED FILL — WARFARIN SODIUM 5 MG TABLET: 5 | 30 days supply | Qty: 50 | Fill #0

## 2017-03-17 NOTE — Telephone Encounter (Signed)
Refill done for only 1 month as he has missed several coumadin visits has appt to be seen on 03/30/2017

## 2017-04-17 NOTE — Progress Notes (Deleted)
CARDIOLOGY OFFICE NOTE  Date:  04/17/2017    [name] Date of Birth: 07-16-1961 Medical Record #[id number]  PCP:  Loyal Jacobson, MD  Cardiologist:  Eden Emms  No chief complaint on file.   History of Present Illness: Troy Yu is a 56 y.o. male who presents today for a follow up Here to further discuss AICD for ischemic DCM    Presented in June 2017 to the ER with chest pain and right leg pain.   Initial evaluation includes evaluation by cardiology who noted subacute anterior MI on EKG. No prior history of CAD, PVD, angina or claudication. Was under more stress when his office roof leaked and had been fasting 30 days prior for religious purposes.   He was admitted with subacute anterior MI complicated by LV thrombus and right femoral artery embolism - s/p embolectomy and R calf fasciotomy for compartment syndrome. Underwent PCI. This was complicated by post operative anemia and has received several units of PRBCs. Heparin discontinued as INR became therapeutic on coumadin. Patient refused plastic surgery intervention.During this hospitalization he has also refused to work with physical therapy and refused SNF.  Wound was evaluated by Dr. Arbie Cookey of vascular surgery on 03/15/2016, reporting "continues to have the demarcation of full-thickness loss over the lateral area of the skin closure. No infection with eschar present. Medial wound with good granulating base and minimal debris present. " Dr Arbie Cookey had recommended on multiple occasions plastic surgery consultation for evaluation of split thickness skin graft which he has continued to decline. He reported to Dr. Arbie Cookey not wanting anyone "cutting on his body anymore." Dr Arbie Cookey felt that he could eventually close wound but that this would take months. On 03/16/2016 having declined further evaluation by plastic surgery Dr Early felt that he was stable for discharge from a vascular standpoint.   For his cardiac issues -  he did undergo cath and PCI  Had DES to distal RCA and Mid LAD EF 30-35% large apical thrombus  Has DM and does not want insulin. Has not been compliant with his beta blocker and ACE makes him feel tired   Echo 09/05/16 EF 30-35%   He still refuses to be on ACE or have AICD evaluation. Just wants to come off meds Told him we could stop plavix in July.   Some stress catching up on lost work when in hospital Divorced from wife and she tried to take 49 yo daughter from him They now have joint custody   Past Medical History:  Diagnosis Date  . Diabetes (HCC)   . LV (left ventricular) mural thrombus   . NSTEMI (non-ST elevated myocardial infarction) [facility])     Past Surgical History:  Procedure Laterality Date  . APPLICATION OF WOUND VAC Right 02/22/2016   Procedure: APPLICATION OF Negative Pressure Dressing Right Lower Leg;  Surgeon: Fransisco Hertz, MD;  Location: [facility] OR;  Service: Vascular;  Laterality: Right;  . APPLICATION OF WOUND VAC Right 02/26/2016   Procedure: APPLICATION OF WOUND VAC, RIGHT MEDIAL LOWER LEG FASCIOTOMY SITE;  Surgeon: Larina Earthly, MD;  Location[facility] OR;  Service: Vascular;  Laterality: Right;  . CARDIAC CATHETERIZATION N/A 03/09/2016   Procedure: Coronary Stent Intervention;  Surgeon: Iran Ouch, MD;  Location: [facility] INVASIVE CV LAB;  Service: Cardiovascular;  Laterality: N/A;  distal RCA mid LAD  . CARDIAC CATHETERIZATION N/A 03/09/2016   Procedure: Intravascular Pressure Wire/FFR Study;  Surgeon: Iran Ouch, MD;  Location: [facility] INVASIVE CV LAB;  Service: Cardiovascular;  Laterality: N/A;  mid LAD  . CARDIAC CATHETERIZATION N/A 03/09/2016   Procedure: Left Heart Cath and Coronary Angiography;  Surgeon: Iran Ouch, MD;  Location: [facility] INVASIVE CV LAB;  Service: Cardiovascular;  Laterality: N/A;  . EMBOLECTOMY Right 02/21/2016   Procedure: EMBOLECTOMY RIGHT COMMON FEMORAL ARTERY; FOUR COMPARTMENT FASCIOTOMY;  Surgeon: Fransisco Hertz, MD;  Location: [facility] OR;  Service:  Vascular;  Laterality: Right;  . FASCIOTOMY CLOSURE Right 02/26/2016   Procedure: RIGHT LATERAL LOWER LEG FASCIOTOMY CLOSURE;  Surgeon: Larina Earthly, MD;  Location: [facility] OR;  Service: Vascular;  Laterality: Right;  . HEMATOMA EVACUATION Right 02/22/2016   Procedure: EVACUATION HEMATOMA With Placement of Negative Pressure Dressing;  Surgeon: Fransisco Hertz, MD;  Location: [facility] OR;  Service: Vascular;  Laterality: Right;     Medications: Current Outpatient Prescriptions  Medication Sig Dispense Refill  . atorvastatin (LIPITOR) 40 MG tablet Take 1 tablet (40 mg total) by mouth daily. 30 tablet 12  . carvedilol (COREG) 6.25 MG tablet TAKE 1 TABLET BY MOUTH 2 TIMES DAILY WITH A MEAL. 180 tablet 2  . clopidogrel (PLAVIX) 75 MG tablet TAKE 1 TABLET BY MOUTH DAILY WITH BREAKFAST. 90 tablet 2  . metFORMIN (GLUCOPHAGE-XR) 500 MG 24 hr tablet Take 500 mg by mouth 2 (two) times daily.    Marland Kitchen warfarin (COUMADIN) 5 MG tablet Take as directed by coumadin clinic 50 tablet 0   No current facility-administered medications for this visit.     Allergies: No Known Allergies  Social History: The patient  reports that he has never smoked. He has never used smokeless tobacco. He reports that he does not drink alcohol or use drugs.   Family History: The patient's family history includes Heart Problems in his father and mother.   Review of Systems: Please see the history of present illness.   Otherwise, the review of systems is positive for none.   All other systems are reviewed and negative.   Physical Exam: VS:  There were no vitals taken for this visit. Marland Kitchen  BMI There is no height or weight on file to calculate BMI.  Wt Readings from Last 3 Encounters:  10/27/16 201 lb 1.9 oz (91.2 kg)  07/26/16 192 lb (87.1 kg)  06/07/16 183 lb 3.2 oz (83.1 kg)    General: Alert. Very argumentative. He is alert and in no acute distress. His weight is down 5 more pounds.  He was subsequently examined by Dr. Eden Emms.  HEENT:  Normal.  Neck: Supple, no JVD, carotid bruits, or masses noted.  Cardiac:  Rhythm is regular. No edema. The right lower leg is wrapped.  Respiratory:  Lungs are clear to auscultation bilaterally with normal work of breathing.  GI: Soft and nontender.  MS: No deformity or atrophy. Gait not tested.   Skin: Warm and dry. Color is normal.  Neuro:  Strength and sensation are intact and no gross focal deficits noted.  Psych: Alert, appropriate and with normal affect. RLE wrapped with plus 3 DP/PT good capillary refill   LABORATORY DATA:  EKG:   .03/21/16 SR old IMI and old anterior MI  Lab Results  Component Value Date   WBC 7.1 04/04/2016   HGB 11.6 (L) 04/04/2016   HCT 36.3 (L) 04/04/2016   PLT 402 (H) 04/04/2016   GLUCOSE 264 (H) 04/04/2016   CHOL 116 02/23/2016   TRIG 130 02/23/2016   HDL 30 (L) 02/23/2016   LDLCALC 60 02/23/2016   ALT 23 04/04/2016  AST 20 04/04/2016   NA 136 04/04/2016   K 4.3 04/04/2016   CL 101 04/04/2016   CREATININE 0.90 04/04/2016   BUN 13 04/04/2016   CO2 24 04/04/2016   TSH 3.200 03/03/2016   INR 2.2 02/16/2017   HGBA1C 5.9 (H) 06/01/2016    BNP (last 3 results) No results for input(s): BNP in the last 8760 hours.  ProBNP (last 3 results) No results for input(s): PROBNP in the last 8760 hours.   Other Studies Reviewed Today:  Cardiac Cath Conclusion from 02/2016    Prox RCA lesion, 10% stenosed.  Dist LAD lesion, 60% stenosed.  Dist RCA lesion, 90% stenosed. Post intervention, there is a 0% residual stenosis.  Mid LAD lesion, 60% stenosed. Post intervention, there is a 0% residual stenosis.  1. Significant 2 vessel coronary artery disease involving the distal right coronary artery and mid LAD. The culprit for recent myocardial infarction seems to be the mid LAD stenosis which appears to be thrombotic and hazy in spite of being only 60% in severity. However, this was significant by FFR.  2. Moderately elevated left ventricular  end-diastolic pressure. LV angiography was not performed due to LV thrombus.  3. Successful angioplasty and drug-eluting stent placement to the distal right coronary artery and mid left anterior descending artery.   Recommendations: Recommend treatment with aspirin, Plavix and warfarin for one month. After one month, aspirin can be discontinued. Heparin can be resumed today 8 hours after sheath pull. Warfarin can be started. I switched from propranolol to carvedilol. Continue treatment for cardiomyopathy and add an ACE inhibitor or ARB before hospital discharge. There was residual 60% distal LAD stenosis which was left to be treated medically.   Echo Study Conclusions from 01/2016  - Left ventricle: Anteroapical infarct with large thrombus burden at apex. Mobile and high embolic potential. The cavity size was severely dilated. Wall thickness was normal. Systolic function was moderately to severely reduced. The estimated ejection fraction was in the range of 30% to 35%. - Atrial septum: No defect or patent foramen ovale was identified.  Assessment/Plan: 1. Anterior MI - with associated LV dysfunction. Cardiac catheterization with DES to distal RCA and Mid LAD. Residual CAD noted. Would plan to treat medically.   2. Ischemic CM - with EF of 30 to 35%. Last BUN/creatine was normal. Have tried to add ACE but he has stopped due to his soft BP. Will try to add again. He does not want to see EP regarding AICD or other advanced Rx  3. LV (left ventricular) mural thrombus (HCC) with embolization of right femoral artery resulting in limb ischemia s/p thrombectomy and fasciotomy with post operative course complicated by hematoma and bleeding s/p evacuation of hematoma with application of a VAC in the setting of therapeutic anti-coagulation. Seen by Dr. Arbie Cookey. Finally healed   4. Newly diagnosed diabetes mellitus/Hyperglycemia -Hemoglobin A1c 9.2. f/u with primary currently not taking  any meds  -  6. PVD:   Still with foot drop. F/u with [facility] Dr Arbie Cookey      Troy Yu

## 2017-04-19 ENCOUNTER — Other Ambulatory Visit: Payer: Self-pay | Admitting: Cardiovascular Disease

## 2017-04-19 ENCOUNTER — Ambulatory Visit: Payer: BLUE CROSS/BLUE SHIELD | Admitting: Cardiovascular Disease

## 2017-04-19 MED FILL — ATORVASTATIN 40 MG TABLET: 40 | 30 days supply | Qty: 30 | Fill #0 | Status: TO

## 2017-04-19 MED FILL — WARFARIN SODIUM 5 MG TABLET: 5 | 30 days supply | Qty: 50 | Fill #2

## 2017-04-19 NOTE — Progress Notes (Signed)
CARDIOLOGY OFFICE NOTE  Date:  04/19/2017    [name] Date of Birth: 07-24-1961 Medical Record #[id number]  PCP:  Loyal Jacobson, MD  Cardiologist:  Eden Emms  No chief complaint on file.   History of Present Illness: Jermane Lesniewski is a 56 y.o. male who presents today for a follow up Here to further discuss AICD for ischemic DCM    Presented in June 2017 to the ER with chest pain and right leg pain.   Initial evaluation includes evaluation by cardiology who noted subacute anterior MI on EKG. No prior history of CAD, PVD, angina or claudication. Was under more stress when his office roof leaked and had been fasting 30 days prior for religious purposes.   He was admitted with subacute anterior MI complicated by LV thrombus and right femoral artery embolism - s/p embolectomy and R calf fasciotomy for compartment syndrome. Underwent PCI. This was complicated by post operative anemia and has received several units of PRBCs. Heparin discontinued as INR became therapeutic on coumadin. Patient refused plastic surgery intervention.During this hospitalization he has also refused to work with physical therapy and refused SNF.  Wound was evaluated by Dr. Arbie Cookey of vascular surgery on 03/15/2016, reporting "continues to have the demarcation of full-thickness loss over the lateral area of the skin closure. No infection with eschar present. Medial wound with good granulating base and minimal debris present. " Dr Arbie Cookey had recommended on multiple occasions plastic surgery consultation for evaluation of split thickness skin graft which he has continued to decline. He reported to Dr. Arbie Cookey not wanting anyone "cutting on his body anymore." Dr Arbie Cookey felt that he could eventually close wound but that this would take months. On 03/16/2016 having declined further evaluation by plastic surgery Dr Early felt that he was stable for discharge from a vascular standpoint.   For his cardiac issues -  he did undergo cath and PCI  Had DES to distal RCA and Mid LAD EF 30-35% large apical thrombus  Has DM and does not want insulin. Has not been compliant with his beta blocker and ACE makes him feel tired   Echo 09/05/16 EF 30-35%   He still refuses to be on ACE or have AICD evaluation. Just wants to come off meds Told him we could stop plavix in July.   Some stress catching up on lost work when in hospital Divorced from wife and she tried to take 84 yo daughter from him They now have joint custody   Still wants to stop meds Discussed importance of ACE   Past Medical History:  Diagnosis Date  . Diabetes (HCC)   . LV (left ventricular) mural thrombus   . NSTEMI (non-ST elevated myocardial infarction) [facility])     Past Surgical History:  Procedure Laterality Date  . APPLICATION OF WOUND VAC Right 02/22/2016   Procedure: APPLICATION OF Negative Pressure Dressing Right Lower Leg;  Surgeon: Fransisco Hertz, MD;  Location: [facility] OR;  Service: Vascular;  Laterality: Right;  . APPLICATION OF WOUND VAC Right 02/26/2016   Procedure: APPLICATION OF WOUND VAC, RIGHT MEDIAL LOWER LEG FASCIOTOMY SITE;  Surgeon: Larina Earthly, MD;  Location: [facility] OR;  Service: Vascular;  Laterality: Right;  . CARDIAC CATHETERIZATION N/A 03/09/2016   Procedure: Coronary Stent Intervention;  Surgeon: Iran Ouch, MD;  Location: [facility] INVASIVE CV LAB;  Service: Cardiovascular;  Laterality: N/A;  distal RCA mid LAD  . CARDIAC CATHETERIZATION N/A 03/09/2016   Procedure: Intravascular Pressure Wire/FFR Study;  Surgeon: Iran Ouch, MD;  Location: [facility] INVASIVE CV LAB;  Service: Cardiovascular;  Laterality: N/A;  mid LAD  . CARDIAC CATHETERIZATION N/A 03/09/2016   Procedure: Left Heart Cath and Coronary Angiography;  Surgeon: Iran Ouch, MD;  Location: [facility] INVASIVE CV LAB;  Service: Cardiovascular;  Laterality: N/A;  . EMBOLECTOMY Right 02/21/2016   Procedure: EMBOLECTOMY RIGHT COMMON FEMORAL ARTERY; FOUR COMPARTMENT FASCIOTOMY;   Surgeon: Fransisco Hertz, MD;  Location: [facility] OR;  Service: Vascular;  Laterality: Right;  . FASCIOTOMY CLOSURE Right 02/26/2016   Procedure: RIGHT LATERAL LOWER LEG FASCIOTOMY CLOSURE;  Surgeon: Larina Earthly, MD;  Location: [facility] OR;  Service: Vascular;  Laterality: Right;  . HEMATOMA EVACUATION Right 02/22/2016   Procedure: EVACUATION HEMATOMA With Placement of Negative Pressure Dressing;  Surgeon: Fransisco Hertz, MD;  Location: [facility] OR;  Service: Vascular;  Laterality: Right;     Medications: Current Outpatient Prescriptions  Medication Sig Dispense Refill  . atorvastatin (LIPITOR) 40 MG tablet TAKE 1 TABLET BY MOUTH ONCE DAILY 30 tablet 6  . carvedilol (COREG) 6.25 MG tablet TAKE 1 TABLET BY MOUTH 2 TIMES DAILY WITH A MEAL. 180 tablet 2  . clopidogrel (PLAVIX) 75 MG tablet TAKE 1 TABLET BY MOUTH DAILY WITH BREAKFAST. 90 tablet 2  . metFORMIN (GLUCOPHAGE-XR) 500 MG 24 hr tablet Take 500 mg by mouth 2 (two) times daily.    Marland Kitchen warfarin (COUMADIN) 5 MG tablet Take as directed by coumadin clinic 50 tablet 0   No current facility-administered medications for this visit.     Allergies: No Known Allergies  Social History: The patient  reports that he has never smoked. He has never used smokeless tobacco. He reports that he does not drink alcohol or use drugs.   Family History: The patient's family history includes Heart Problems in his father and mother.   Review of Systems: Please see the history of present illness.   Otherwise, the review of systems is positive for none.   All other systems are reviewed and negative.   Physical Exam: VS:  There were no vitals taken for this visit. Marland Kitchen  BMI There is no height or weight on file to calculate BMI.  Wt Readings from Last 3 Encounters:  10/27/16 201 lb 1.9 oz (91.2 kg)  07/26/16 192 lb (87.1 kg)  06/07/16 183 lb 3.2 oz (83.1 kg)    General: Alert. Very argumentative. He is alert and in no acute distress. His weight is down 5 more pounds.  He was  subsequently examined by Dr. Eden Emms.  HEENT: Normal.  Neck: Supple, no JVD, carotid bruits, or masses noted.  Cardiac:  Rhythm is regular. No edema. The right lower leg is wrapped.  Respiratory:  Lungs are clear to auscultation bilaterally with normal work of breathing.  GI: Soft and nontender.  MS: No deformity or atrophy. Gait not tested.   Skin: Warm and dry. Color is normal.  Neuro:  Strength and sensation are intact and no gross focal deficits noted.  Psych: Alert, appropriate and with normal affect. RLE with fasciotomy scars    LABORATORY DATA:  EKG:   .03/21/16 SR old IMI and old anterior MI 04/20/17 SR old IMI/AMI no acute changes   Lab Results  Component Value Date   WBC 7.1 04/04/2016   HGB 11.6 (L) 04/04/2016   HCT 36.3 (L) 04/04/2016   PLT 402 (H) 04/04/2016   GLUCOSE 264 (H) 04/04/2016   CHOL 116 02/23/2016   TRIG 130 02/23/2016  HDL 30 (L) 02/23/2016   LDLCALC 60 02/23/2016   ALT 23 04/04/2016   AST 20 04/04/2016   NA 136 04/04/2016   K 4.3 04/04/2016   CL 101 04/04/2016   CREATININE 0.90 04/04/2016   BUN 13 04/04/2016   CO2 24 04/04/2016   TSH 3.200 03/03/2016   INR 2.2 02/16/2017   HGBA1C 5.9 (H) 06/01/2016    BNP (last 3 results) No results for input(s): BNP in the last 8760 hours.  ProBNP (last 3 results) No results for input(s): PROBNP in the last 8760 hours.   Other Studies Reviewed Today:  Cardiac Cath Conclusion from 02/2016    Prox RCA lesion, 10% stenosed.  Dist LAD lesion, 60% stenosed.  Dist RCA lesion, 90% stenosed. Post intervention, there is a 0% residual stenosis.  Mid LAD lesion, 60% stenosed. Post intervention, there is a 0% residual stenosis.  1. Significant 2 vessel coronary artery disease involving the distal right coronary artery and mid LAD. The culprit for recent myocardial infarction seems to be the mid LAD stenosis which appears to be thrombotic and hazy in spite of being only 60% in severity. However, this was  significant by FFR.  2. Moderately elevated left ventricular end-diastolic pressure. LV angiography was not performed due to LV thrombus.  3. Successful angioplasty and drug-eluting stent placement to the distal right coronary artery and mid left anterior descending artery.   Recommendations: Recommend treatment with aspirin, Plavix and warfarin for one month. After one month, aspirin can be discontinued. Heparin can be resumed today 8 hours after sheath pull. Warfarin can be started. I switched from propranolol to carvedilol. Continue treatment for cardiomyopathy and add an ACE inhibitor or ARB before hospital discharge. There was residual 60% distal LAD stenosis which was left to be treated medically.   Echo Study Conclusions from 01/2016  - Left ventricle: Anteroapical infarct with large thrombus burden at apex. Mobile and high embolic potential. The cavity size was severely dilated. Wall thickness was normal. Systolic function was moderately to severely reduced. The estimated ejection fraction was in the range of 30% to 35%. - Atrial septum: No defect or patent foramen ovale was identified.  Assessment/Plan: 1. Anterior MI - with associated LV dysfunction. Cardiac catheterization with DES to distal RCA and Mid LAD. Residual CAD noted. Would plan to treat medically.   2. Ischemic CM - with EF of 30 to 35%. Last BUN/creatine was normal. Have tried to add ACE but he has stopped due to his soft BP. Will try to add again. He does not want to see EP regarding AICD or other advanced Rx Lisinopril 2.5 mg PO   3. LV (left ventricular) mural thrombus (HCC) with embolization of right femoral artery resulting in limb ischemia s/p thrombectomy and fasciotomy with post operative course complicated by hematoma and bleeding s/p evacuation of hematoma with application of a VAC in the setting of therapeutic anti-coagulation. Seen by Dr. Arbie Cookey. Finally healed   4. Newly diagnosed  diabetes mellitus/Hyperglycemia -Hemoglobin A1c 9.2. f/u with primary currently not taking any meds  -  6. PVD:   Still with foot drop. F/u with [facility] Dr Early   7. Cholesterol:  At goal he insists on cutting lipitor back to 20 mg f/u labs 3 months   Lab Results  Component Value Date   LDLCALC 60 02/23/2016       Charlton Haws

## 2017-04-20 ENCOUNTER — Ambulatory Visit (INDEPENDENT_AMBULATORY_CARE_PROVIDER_SITE_OTHER): Payer: BLUE CROSS/BLUE SHIELD | Admitting: Cardiovascular Disease

## 2017-04-20 ENCOUNTER — Encounter: Payer: Self-pay | Admitting: Cardiovascular Disease

## 2017-04-20 ENCOUNTER — Ambulatory Visit (INDEPENDENT_AMBULATORY_CARE_PROVIDER_SITE_OTHER): Payer: BLUE CROSS/BLUE SHIELD

## 2017-04-20 VITALS — BP 112/78 | HR 65 | Ht 68.0 in | Wt 201.8 lb

## 2017-04-20 DIAGNOSIS — I214 Non-ST elevation (NSTEMI) myocardial infarction: Secondary | ICD-10-CM

## 2017-04-20 DIAGNOSIS — Z7901 Long term (current) use of anticoagulants: Secondary | ICD-10-CM

## 2017-04-20 DIAGNOSIS — I5041 Acute combined systolic (congestive) and diastolic (congestive) heart failure: Secondary | ICD-10-CM | POA: Diagnosis not present

## 2017-04-20 LAB — POCT INR: INR: 2.5

## 2017-04-20 MED ORDER — LISINOPRIL 2.5 MG PO TABS: 2.5000 mg | ORAL_TABLET | Freq: Every day | ORAL | 3 refills | Status: DC

## 2017-04-20 MED ORDER — ATORVASTATIN CALCIUM 20 MG PO TABS: 20.0000 mg | ORAL_TABLET | Freq: Every day | ORAL | 3 refills | Status: DC

## 2017-04-20 NOTE — Patient Instructions (Addendum)
Medication Instructions:  Your physician has recommended you make the following change in your medication:  1-START Lisinopril 2.5 mg by mouth daily 1-Decrease Lipitor 20 mg by mouth daily   Labwork: NONE  Testing/Procedures: NONE  Follow-Up: Your physician wants you to follow-up in: 6 months with Dr. Eden Emms. You will receive a reminder letter in the mail two months in advance. If you don't receive a letter, please call our office to schedule the follow-up appointment.   If you need a refill on your cardiac medications before your next appointment, please call your pharmacy.   Carvedilol tablets What is this medicine? CARVEDILOL (KAR ve dil ol) is a beta-blocker. Beta-blockers reduce the workload on the heart and help it to beat more regularly. This medicine is used to treat high blood pressure and heart failure. This medicine may be used for other purposes; ask your health care provider or pharmacist if you have questions. COMMON BRAND NAME(S): Coreg What should I tell my health care provider before I take this medicine? They need to know if you have any of these conditions: -circulation problems -diabetes -history of heart attack or heart disease -liver disease -lung or breathing disease, like asthma or emphysema -pheochromocytoma -slow or irregular heartbeat -thyroid disease -an unusual or allergic reaction to carvedilol, other beta-blockers, medicines, foods, dyes, or preservatives -pregnant or trying to get pregnant -breast-feeding How should I use this medicine? Take this medicine by mouth with a glass of water. Follow the directions on the prescription label. It is best to take the tablets with food. Take your doses at regular intervals. Do not take your medicine more often than directed. Do not stop taking except on the advice of your doctor or health care professional. Talk to your pediatrician regarding the use of this medicine in children. Special care may be  needed. Overdosage: If you think you have taken too much of this medicine contact a poison control center or emergency room at once. NOTE: This medicine is only for you. Do not share this medicine with others. What if I miss a dose? If you miss a dose, take it as soon as you can. If it is almost time for your next dose, take only that dose. Do not take double or extra doses. What may interact with this medicine? This medicine may interact with the following medications: -certain medicines for blood pressure, heart disease, irregular heart beat -certain medicines for depression, like fluoxetine or paroxetine -certain medicines for diabetes, like glipizide or glyburide -cimetidine -clonidine -cyclosporine -digoxin -MAOIs like Carbex, Eldepryl, Marplan, Nardil, and Parnate -reserpine -rifampin This list may not describe all possible interactions. Give your health care provider a list of all the medicines, herbs, non-prescription drugs, or dietary supplements you use. Also tell them if you smoke, drink alcohol, or use illegal drugs. Some items may interact with your medicine. What should I watch for while using this medicine? Check your heart rate and blood pressure regularly while you are taking this medicine. Ask your doctor or health care professional what your heart rate and blood pressure should be, and when you should contact him or her. Do not stop taking this medicine suddenly. This could lead to serious heart-related effects. Contact your doctor or health care professional if you have difficulty breathing while taking this drug. Check your weight daily. Ask your doctor or health care professional when you should notify him/her of any weight gain. You may get drowsy or dizzy. Do not drive, use machinery, or do anything  that requires mental alertness until you know how this medicine affects you. To reduce the risk of dizzy or fainting spells, do not sit or stand up quickly. Alcohol can make  you more drowsy, and increase flushing and rapid heartbeats. Avoid alcoholic drinks. If you have diabetes, check your blood sugar as directed. Tell your doctor if you have changes in your blood sugar while you are taking this medicine. If you are going to have surgery, tell your doctor or health care professional that you are taking this medicine. What side effects may I notice from receiving this medicine? Side effects that you should report to your doctor or health care professional as soon as possible: -allergic reactions like skin rash, itching or hives, swelling of the face, lips, or tongue -breathing problems -dark urine -irregular heartbeat -swollen legs or ankles -vomiting -yellowing of the eyes or skin Side effects that usually do not require medical attention (report to your doctor or health care professional if they continue or are bothersome): -change in sex drive or performance -diarrhea -dry eyes (especially if wearing contact lenses) -dry, itching skin -headache -nausea -unusually tired This list may not describe all possible side effects. Call your doctor for medical advice about side effects. You may report side effects to FDA at 1-800-FDA-1088. Where should I keep my medicine? Keep out of the reach of children. Store at room temperature below 30 degrees C (86 degrees F). Protect from moisture. Keep container tightly closed. Throw away any unused medicine after the expiration date. NOTE: This sheet is a summary. It may not cover all possible information. If you have questions about this medicine, talk to your doctor, pharmacist, or health care provider.  2018 Elsevier/Gold Standard (2013-04-21 14:12:02) Carvedilol tablets What is this medicine? CARVEDILOL (KAR ve dil ol) is a beta-blocker. Beta-blockers reduce the workload on the heart and help it to beat more regularly. This medicine is used to treat high blood pressure and heart failure. This medicine may be used for  other purposes; ask your health care provider or pharmacist if you have questions. COMMON BRAND NAME(S): Coreg What should I tell my health care provider before I take this medicine? They need to know if you have any of these conditions: -circulation problems -diabetes -history of heart attack or heart disease -liver disease -lung or breathing disease, like asthma or emphysema -pheochromocytoma -slow or irregular heartbeat -thyroid disease -an unusual or allergic reaction to carvedilol, other beta-blockers, medicines, foods, dyes, or preservatives -pregnant or trying to get pregnant -breast-feeding How should I use this medicine? Take this medicine by mouth with a glass of water. Follow the directions on the prescription label. It is best to take the tablets with food. Take your doses at regular intervals. Do not take your medicine more often than directed. Do not stop taking except on the advice of your doctor or health care professional. Talk to your pediatrician regarding the use of this medicine in children. Special care may be needed. Overdosage: If you think you have taken too much of this medicine contact a poison control center or emergency room at once. NOTE: This medicine is only for you. Do not share this medicine with others. What if I miss a dose? If you miss a dose, take it as soon as you can. If it is almost time for your next dose, take only that dose. Do not take double or extra doses. What may interact with this medicine? This medicine may interact with the following medications: -certain  medicines for blood pressure, heart disease, irregular heart beat -certain medicines for depression, like fluoxetine or paroxetine -certain medicines for diabetes, like glipizide or glyburide -cimetidine -clonidine -cyclosporine -digoxin -MAOIs like Carbex, Eldepryl, Marplan, Nardil, and Parnate -reserpine -rifampin This list may not describe all possible interactions. Give your  health care provider a list of all the medicines, herbs, non-prescription drugs, or dietary supplements you use. Also tell them if you smoke, drink alcohol, or use illegal drugs. Some items may interact with your medicine. What should I watch for while using this medicine? Check your heart rate and blood pressure regularly while you are taking this medicine. Ask your doctor or health care professional what your heart rate and blood pressure should be, and when you should contact him or her. Do not stop taking this medicine suddenly. This could lead to serious heart-related effects. Contact your doctor or health care professional if you have difficulty breathing while taking this drug. Check your weight daily. Ask your doctor or health care professional when you should notify him/her of any weight gain. You may get drowsy or dizzy. Do not drive, use machinery, or do anything that requires mental alertness until you know how this medicine affects you. To reduce the risk of dizzy or fainting spells, do not sit or stand up quickly. Alcohol can make you more drowsy, and increase flushing and rapid heartbeats. Avoid alcoholic drinks. If you have diabetes, check your blood sugar as directed. Tell your doctor if you have changes in your blood sugar while you are taking this medicine. If you are going to have surgery, tell your doctor or health care professional that you are taking this medicine. What side effects may I notice from receiving this medicine? Side effects that you should report to your doctor or health care professional as soon as possible: -allergic reactions like skin rash, itching or hives, swelling of the face, lips, or tongue -breathing problems -dark urine -irregular heartbeat -swollen legs or ankles -vomiting -yellowing of the eyes or skin Side effects that usually do not require medical attention (report to your doctor or health care professional if they continue or are  bothersome): -change in sex drive or performance -diarrhea -dry eyes (especially if wearing contact lenses) -dry, itching skin -headache -nausea -unusually tired This list may not describe all possible side effects. Call your doctor for medical advice about side effects. You may report side effects to FDA at 1-800-FDA-1088. Where should I keep my medicine? Keep out of the reach of children. Store at room temperature below 30 degrees C (86 degrees F). Protect from moisture. Keep container tightly closed. Throw away any unused medicine after the expiration date. NOTE: This sheet is a summary. It may not cover all possible information. If you have questions about this medicine, talk to your doctor, pharmacist, or health care provider.  2018 Elsevier/Gold Standard (2013-04-21 14:12:02)

## 2017-05-17 MED FILL — CLOPIDOGREL 75 MG TABLET: 75 | 90 days supply | Qty: 90 | Fill #1 | Status: TO

## 2017-05-24 ENCOUNTER — Other Ambulatory Visit: Payer: Self-pay | Admitting: Cardiovascular Disease

## 2017-05-24 MED FILL — WARFARIN SODIUM 5 MG TABLET: 5 | 30 days supply | Qty: 50 | Fill #0

## 2017-05-24 MED FILL — CARVEDILOL 6.25 MG TABLET: 6.25 | 90 days supply | Qty: 180 | Fill #1

## 2017-06-19 MED FILL — ATORVASTATIN 40 MG TABLET: 40 | 30 days supply | Qty: 30 | Fill #1 | Status: TO

## 2017-06-26 ENCOUNTER — Ambulatory Visit (INDEPENDENT_AMBULATORY_CARE_PROVIDER_SITE_OTHER): Payer: BLUE CROSS/BLUE SHIELD | Admitting: *Deleted

## 2017-06-26 DIAGNOSIS — I513 Intracardiac thrombosis, not elsewhere classified: Secondary | ICD-10-CM

## 2017-06-26 DIAGNOSIS — Z7901 Long term (current) use of anticoagulants: Secondary | ICD-10-CM | POA: Diagnosis not present

## 2017-06-26 DIAGNOSIS — I749 Embolism and thrombosis of unspecified artery: Secondary | ICD-10-CM

## 2017-06-26 LAB — POCT INR: INR: 4

## 2017-06-26 MED FILL — WARFARIN SODIUM 5 MG TABLET: 5 | 30 days supply | Qty: 50 | Fill #1 | Status: TO

## 2017-08-07 MED FILL — CLOPIDOGREL 75 MG TABLET: 75 | 90 days supply | Qty: 90 | Fill #0

## 2017-08-07 MED FILL — WARFARIN SODIUM 5 MG TABLET: 5 | 30 days supply | Qty: 50 | Fill #0

## 2017-08-16 ENCOUNTER — Ambulatory Visit (INDEPENDENT_AMBULATORY_CARE_PROVIDER_SITE_OTHER): Payer: BLUE CROSS/BLUE SHIELD | Admitting: *Deleted

## 2017-08-16 DIAGNOSIS — I513 Intracardiac thrombosis, not elsewhere classified: Secondary | ICD-10-CM | POA: Diagnosis not present

## 2017-08-16 DIAGNOSIS — Z7901 Long term (current) use of anticoagulants: Secondary | ICD-10-CM | POA: Diagnosis not present

## 2017-08-16 DIAGNOSIS — Z5181 Encounter for therapeutic drug level monitoring: Secondary | ICD-10-CM | POA: Diagnosis not present

## 2017-08-16 LAB — POCT INR: INR: 1.5

## 2017-08-16 NOTE — Patient Instructions (Addendum)
  Description   Pt states he has been taking 1 tablet one day and 1.5 tablets next day Today Dec 19th take another 1 tablet of coumadin since has taken 1 tablet today  then do dose as instructed   1.5 tablets everyday.  Recheck in 2 weeks. Main # [phone] or call coumadin clinic [phone]

## 2017-08-23 MED FILL — CARVEDILOL 6.25 MG TABLET: 6.25 | 90 days supply | Qty: 180 | Fill #2

## 2017-08-23 MED FILL — ATORVASTATIN 20 MG TABLET: 20 | 90 days supply | Qty: 90 | Fill #0 | Status: TO

## 2017-08-28 ENCOUNTER — Ambulatory Visit (INDEPENDENT_AMBULATORY_CARE_PROVIDER_SITE_OTHER): Payer: BLUE CROSS/BLUE SHIELD | Admitting: Pharmacist

## 2017-08-28 DIAGNOSIS — I513 Intracardiac thrombosis, not elsewhere classified: Secondary | ICD-10-CM | POA: Diagnosis not present

## 2017-08-28 DIAGNOSIS — Z7901 Long term (current) use of anticoagulants: Secondary | ICD-10-CM | POA: Diagnosis not present

## 2017-08-28 LAB — POCT INR: INR: 1.7

## 2017-08-28 NOTE — Patient Instructions (Signed)
Take 2 tablets today, then start taking 1.5 daily except 2 tablets on Fridays.   Recheck in 2 weeks. Main # [phone] or call coumadin clinic [phone]

## 2017-09-11 ENCOUNTER — Ambulatory Visit (INDEPENDENT_AMBULATORY_CARE_PROVIDER_SITE_OTHER): Payer: BLUE CROSS/BLUE SHIELD | Admitting: *Deleted

## 2017-09-11 DIAGNOSIS — Z7901 Long term (current) use of anticoagulants: Secondary | ICD-10-CM

## 2017-09-11 DIAGNOSIS — I513 Intracardiac thrombosis, not elsewhere classified: Secondary | ICD-10-CM

## 2017-09-11 LAB — POCT INR: INR: 1.8

## 2017-09-11 NOTE — Patient Instructions (Addendum)
   Description   Take 2 tablets today, then start taking 1 tablet daily except 2 tablets on Mondays and Fridays.   Recheck in 2 weeks. Main # [phone] or call coumadin clinic [phone]

## 2017-09-13 MED FILL — WARFARIN SODIUM 5 MG TABLET: 5 | 30 days supply | Qty: 50 | Fill #1

## 2017-10-23 ENCOUNTER — Telehealth: Payer: Self-pay | Admitting: *Deleted

## 2017-10-23 ENCOUNTER — Other Ambulatory Visit: Payer: Self-pay | Admitting: Cardiovascular Disease

## 2017-10-23 MED FILL — WARFARIN SODIUM 5 MG TABLET: 5 | 10 days supply | Qty: 10 | Fill #0

## 2017-10-23 NOTE — Telephone Encounter (Signed)
Pt called to make an appt as he is overdue for follow-up. Pt unable to come on any day that was offered to the pt this week. Pt able to come to appt next week, set per availability. At the end of call, pt asked if he could have a month supply refill on Coumadin, instructed pt that since he is overdue we can not send in a month supply but can send in enough to get him to the appt on Monday, which is 7 days worth. He stated well I take this med daily so why not, instructed him that since he is a month overdue for follow-up, that we can not as he has not adhered to appts. He begin to raise the tone of his voice on the phone and speaking fast therefore, waited until he was done & advised taht I will be able to send in a 7 day supply until appt & offered another appt this week & he declined. Phone called ended by pt then I hung up. Will send in 7 day supply

## 2017-10-30 ENCOUNTER — Ambulatory Visit (INDEPENDENT_AMBULATORY_CARE_PROVIDER_SITE_OTHER): Payer: BLUE CROSS/BLUE SHIELD | Admitting: Pharmacist

## 2017-10-30 DIAGNOSIS — I513 Intracardiac thrombosis, not elsewhere classified: Secondary | ICD-10-CM | POA: Diagnosis not present

## 2017-10-30 DIAGNOSIS — Z7901 Long term (current) use of anticoagulants: Secondary | ICD-10-CM

## 2017-10-30 LAB — POCT INR: INR: 2

## 2017-10-30 MED ORDER — WARFARIN SODIUM 5 MG PO TABS: ORAL_TABLET | ORAL | 1 refills | Status: DC

## 2017-10-30 MED FILL — WARFARIN SODIUM 5 MG TABLET: 5 | 30 days supply | Qty: 45 | Fill #0 | Status: TO

## 2017-10-30 NOTE — Patient Instructions (Signed)
Description   Continue taking 1 tablet daily except 2 tablets on Mondays and Fridays.  Recheck in 3 weeks. Main # [phone] or call coumadin clinic [phone]     

## 2017-11-06 ENCOUNTER — Ambulatory Visit: Payer: BLUE CROSS/BLUE SHIELD | Admitting: Cardiovascular Disease

## 2017-11-16 ENCOUNTER — Other Ambulatory Visit: Payer: Self-pay | Admitting: Cardiovascular Disease

## 2017-11-21 ENCOUNTER — Encounter: Payer: Self-pay | Admitting: Cardiovascular Disease

## 2017-11-29 ENCOUNTER — Other Ambulatory Visit: Payer: Self-pay | Admitting: Cardiovascular Disease

## 2017-11-29 ENCOUNTER — Ambulatory Visit: Payer: BLUE CROSS/BLUE SHIELD | Admitting: Cardiovascular Disease

## 2017-11-29 MED FILL — WARFARIN SODIUM 5 MG TABLET: 5 | 30 days supply | Qty: 45 | Fill #0

## 2017-11-29 MED FILL — ATORVASTATIN 20 MG TABLET: 20 | 90 days supply | Qty: 90 | Fill #0

## 2017-11-30 MED FILL — CARVEDILOL 6.25 MG TABLET: 6.25 | 90 days supply | Qty: 180 | Fill #0

## 2017-11-30 NOTE — Progress Notes (Signed)
CARDIOLOGY OFFICE NOTE  Date:  12/01/2017    Troy Yu Date of Birth: [birth year] Medical Record #[id number]  PCP:  Troy Jacobson, MD  Cardiologist:  Troy Yu  No chief complaint on file.   History of Present Illness: 57 y.o. seen initially in ER June 2017 with subacute anterior MI complicated by distal embolization to right leg. Had a complicated RLE embolectomy requiring fasciotomy by Troy Yu with long healing process including wound Vac. Had DES to distal RCA and mid LAD with EF 30-35% large apical thrombus. Diagnosed with DM Has refused a lot of meds and care.   Some stress catching up on lost work when in hospital Divorced from wife and she tried to take 85 yo daughter from him They now have joint custody   Still wants to stop meds Discussed importance of ACE  But he never filled script  "I think I'm doing ok and don't want to take unnecessary medication"  68 yo daughter and 25 yo son living with him Ex wife went back to [place]  Past Medical History:  Diagnosis Date  . Diabetes (HCC)   . LV (left ventricular) mural thrombus   . NSTEMI (non-ST elevated myocardial infarction) [facility])     Past Surgical History:  Procedure Laterality Date  . APPLICATION OF WOUND VAC Right 02/22/2016   Procedure: APPLICATION OF Negative Pressure Dressing Right Lower Leg;  Surgeon: Troy Hertz, MD;  Location: [facility] OR;  Service: Vascular;  Laterality: Right;  . APPLICATION OF WOUND VAC Right 02/26/2016   Procedure: APPLICATION OF WOUND VAC, RIGHT MEDIAL LOWER LEG FASCIOTOMY SITE;  Surgeon: Troy Earthly, MD;  Location: [facility] OR;  Service: Vascular;  Laterality: Right;  . CARDIAC CATHETERIZATION N/A 03/09/2016   Procedure: Coronary Stent Intervention;  Surgeon: Troy Ouch, MD;  Location: [facility] INVASIVE CV LAB;  Service: Cardiovascular;  Laterality: N/A;  distal RCA mid LAD  . CARDIAC CATHETERIZATION N/A 03/09/2016   Procedure: Intravascular Pressure Wire/FFR Study;  Surgeon: Troy Ouch, MD;  Location[facility] INVASIVE CV LAB;  Service: Cardiovascular;  Laterality: N/A;  mid LAD  . CARDIAC CATHETERIZATION N/A 03/09/2016   Procedure: Left Heart Cath and Coronary Angiography;  Surgeon: Troy Ouch, MD;  Location: [facility] INVASIVE CV LAB;  Service: Cardiovascular;  Laterality: N/A;  . EMBOLECTOMY Right 02/21/2016   Procedure: EMBOLECTOMY RIGHT COMMON FEMORAL ARTERY; FOUR COMPARTMENT FASCIOTOMY;  Surgeon: Troy Hertz, MD;  Location: [facility] OR;  Service: Vascular;  Laterality: Right;  . FASCIOTOMY CLOSURE Right 02/26/2016   Procedure: RIGHT LATERAL LOWER LEG FASCIOTOMY CLOSURE;  Surgeon: Troy Earthly, MD;  Location: [facility] OR;  Service: Vascular;  Laterality: Right;  . HEMATOMA EVACUATION Right 02/22/2016   Procedure: EVACUATION HEMATOMA With Placement of Negative Pressure Dressing;  Surgeon: Troy Hertz, MD;  Location: [facility] OR;  Service: Vascular;  Laterality: Right;     Medications: Current Outpatient Medications  Medication Sig Dispense Refill  . atorvastatin (LIPITOR) 20 MG tablet Take 1 tablet (20 mg total) by mouth daily. 90 tablet 3  . carvedilol (COREG) 6.25 MG tablet Take 1 tablet (6.25 mg total) by mouth 2 (two) times daily with a meal. Please keep upcoming appt for future refills. Thank you 180 tablet 3  . warfarin (COUMADIN) 5 MG tablet TAKE AS DIRECTED BY COUMADIN CLINIC 45 tablet 1   No current facility-administered medications for this visit.     Allergies: No Known Allergies  Social History: The patient  reports that he  has never smoked. He has never used smokeless tobacco. He reports that he does not drink alcohol or use drugs.   Family History: The patient's family history includes Heart Problems in his father and mother.   Review of Systems: Please see the history of present illness.   Otherwise, the review of systems is positive for none.   All other systems are reviewed and negative.   Physical Exam: VS:  BP 126/76   Pulse 87   Ht 5\' 8"  (1.727 m)   Wt 208  lb 8 oz (94.6 kg)   SpO2 98%   BMI 31.70 kg/m  .  BMI Body mass index is 31.7 kg/m.  Wt Readings from Last 3 Encounters:  12/01/17 208 lb 8 oz (94.6 kg)  04/20/17 201 lb 12.8 oz (91.5 kg)  10/27/16 201 lb 1.9 oz (91.2 kg)   Affect appropriate Chronically ill [place] male  HEENT: normal Neck supple with no adenopathy JVP normal no bruits no thyromegaly Lungs clear with no wheezing and good diaphragmatic motion Heart:  S1/S2 no murmur, no rub, gallop or click PMI normal Abdomen: benighn, BS positve, no tenderness, no AAA no bruit.  No HSM or HJR Fasciotomy scars RLE with some foot drop  No edemal Skin warm and dry No muscular weakness    LABORATORY DATA:  EKG:   .03/21/16 SR old IMI and old anterior MI 04/20/17 SR old IMI/AMI no acute changes   Lab Results  Component Value Date   WBC 7.1 04/04/2016   HGB 11.6 (L) 04/04/2016   HCT 36.3 (L) 04/04/2016   PLT 402 (H) 04/04/2016   GLUCOSE 264 (H) 04/04/2016   CHOL 116 02/23/2016   TRIG 130 02/23/2016   HDL 30 (L) 02/23/2016   LDLCALC 60 02/23/2016   ALT 23 04/04/2016   AST 20 04/04/2016   NA 136 04/04/2016   K 4.3 04/04/2016   CL 101 04/04/2016   CREATININE 0.90 04/04/2016   BUN 13 04/04/2016   CO2 24 04/04/2016   TSH 3.200 03/03/2016   INR 1.6 12/01/2017   HGBA1C 5.9 (H) 06/01/2016    BNP (last 3 results) No results for input(s): BNP in the last 8760 hours.  ProBNP (last 3 results) No results for input(s): PROBNP in the last 8760 hours.   Other Studies Reviewed Today:  Cardiac Cath Conclusion from 02/2016    Prox RCA lesion, 10% stenosed.  Dist LAD lesion, 60% stenosed.  Dist RCA lesion, 90% stenosed. Post intervention, there is a 0% residual stenosis.  Mid LAD lesion, 60% stenosed. Post intervention, there is a 0% residual stenosis.  1. Significant 2 vessel coronary artery disease involving the distal right coronary artery and mid LAD. The culprit for recent myocardial infarction seems to be the  mid LAD stenosis which appears to be thrombotic and hazy in spite of being only 60% in severity. However, this was significant by FFR.  2. Moderately elevated left ventricular end-diastolic pressure. LV angiography was not performed due to LV thrombus.  3. Successful angioplasty and drug-eluting stent placement to the distal right coronary artery and mid left anterior descending artery.   Recommendations: Recommend treatment with aspirin, Plavix and warfarin for one month. After one month, aspirin can be discontinued. Heparin can be resumed today 8 hours after sheath pull. Warfarin can be started. I switched from propranolol to carvedilol. Continue treatment for cardiomyopathy and add an ACE inhibitor or ARB before hospital discharge. There was residual 60% distal LAD stenosis which was left to  be treated medically.   Echo Study Conclusions from 01/2016  - Left ventricle: Anteroapical infarct with large thrombus burden at apex. Mobile and high embolic potential. The cavity size was severely dilated. Wall thickness was normal. Systolic function was moderately to severely reduced. The estimated ejection fraction was in the range of 30% to 35%. - Atrial septum: No defect or patent foramen ovale was identified.  Assessment/Plan: 1. Anterior MI - with associated LV dysfunction. Cardiac catheterization with DES to distal RCA and Mid LAD. Residual CAD noted. Would plan to treat medically. Stop plavix change to 81 mg ASA  2. Ischemic CM - with EF of 30 to 35%. He has not taken ACE will update echo and see if mural thrombus persists and what EF is Doubt improvement since he has not been compliant with meds  3. LV (left ventricular) mural thrombus (HCC) with embolization of right femoral artery resulting in limb ischemia s/p thrombectomy and fasciotomy with post operative course complicated by hematoma and bleeding s/p evacuation of hematoma with application of a VAC in the setting of  therapeutic anti-coagulation. Seen by Troy Yu. Finally healed   4. Newly diagnosed diabetes mellitus/Hyperglycemia -Hemoglobin A1c 9.2. f/u with primary currently not taking any meds  -  6. PVD:   Still with foot drop. F/u with [facility] Troy Yu   7. Cholesterol:  At goal he insists on cutting lipitor back to 20 mg f/u labs 3 months   Lab Results  Component Value Date   LDLCALC 60 02/23/2016       Troy Yu

## 2017-12-01 ENCOUNTER — Ambulatory Visit: Payer: BLUE CROSS/BLUE SHIELD | Admitting: Cardiovascular Disease

## 2017-12-01 ENCOUNTER — Ambulatory Visit (INDEPENDENT_AMBULATORY_CARE_PROVIDER_SITE_OTHER): Payer: BLUE CROSS/BLUE SHIELD | Admitting: *Deleted

## 2017-12-01 ENCOUNTER — Encounter: Payer: Self-pay | Admitting: Cardiovascular Disease

## 2017-12-01 VITALS — BP 126/76 | HR 87 | Ht 68.0 in | Wt 208.5 lb

## 2017-12-01 DIAGNOSIS — I2510 Atherosclerotic heart disease of native coronary artery without angina pectoris: Secondary | ICD-10-CM

## 2017-12-01 DIAGNOSIS — I513 Intracardiac thrombosis, not elsewhere classified: Secondary | ICD-10-CM

## 2017-12-01 DIAGNOSIS — Z7901 Long term (current) use of anticoagulants: Secondary | ICD-10-CM | POA: Diagnosis not present

## 2017-12-01 DIAGNOSIS — I255 Ischemic cardiomyopathy: Secondary | ICD-10-CM

## 2017-12-01 DIAGNOSIS — E785 Hyperlipidemia, unspecified: Secondary | ICD-10-CM

## 2017-12-01 DIAGNOSIS — I739 Peripheral vascular disease, unspecified: Secondary | ICD-10-CM

## 2017-12-01 DIAGNOSIS — R739 Hyperglycemia, unspecified: Secondary | ICD-10-CM

## 2017-12-01 LAB — POCT INR: INR: 1.6

## 2017-12-01 MED ORDER — WARFARIN SODIUM 5 MG PO TABS: ORAL_TABLET | ORAL | 1 refills | Status: DC

## 2017-12-01 MED ORDER — ATORVASTATIN CALCIUM 20 MG PO TABS: 20.0000 mg | ORAL_TABLET | Freq: Every day | ORAL | 3 refills | Status: DC

## 2017-12-01 MED ORDER — CARVEDILOL 6.25 MG PO TABS: 6.2500 mg | ORAL_TABLET | Freq: Two times a day (BID) | ORAL | 3 refills | Status: DC

## 2017-12-01 NOTE — Patient Instructions (Addendum)
Medication Instructions:  Your physician has recommended you make the following change in your medication:  1-STOP Plavix  Labwork: NONE  Testing/Procedures: Your physician has requested that you have an echocardiogram. Echocardiography is a painless test that uses sound waves to create images of your heart. It provides your doctor with information about the size and shape of your heart and how well your heart's chambers and valves are working. This procedure takes approximately one hour. There are no restrictions for this procedure.  Follow-Up: Your physician wants you to follow-up in: 6 months with Dr. Eden Emms. You will receive a reminder letter in the mail two months in advance. If you don't receive a letter, please call our office to schedule the follow-up appointment.   If you need a refill on your cardiac medications before your next appointment, please call your pharmacy.

## 2017-12-01 NOTE — Patient Instructions (Signed)
Description   Today take 2.5 tablets, then Continue taking 1 tablet daily except 2 tablets on Mondays and Fridays.  Recheck in 2 weeks. Main # [phone] or call coumadin clinic [phone]

## 2017-12-01 NOTE — Addendum Note (Signed)
Addended by: Virl Axe, Janyla Biscoe L on: 12/01/2017 04:53 PM   Modules accepted: Orders

## 2017-12-07 ENCOUNTER — Ambulatory Visit (HOSPITAL_COMMUNITY): Payer: BLUE CROSS/BLUE SHIELD | Attending: Cardiovascular Disease

## 2017-12-07 DIAGNOSIS — R0989 Other specified symptoms and signs involving the circulatory and respiratory systems: Secondary | ICD-10-CM

## 2017-12-08 IMAGING — DX DG CHEST 1V PORT
1 series · 1 of 1 positions shown · non-contrast
Comparison: None.

CLINICAL DATA: Fever, onset tonight.

EXAM:
PORTABLE CHEST 1 VIEW

[chest ap]
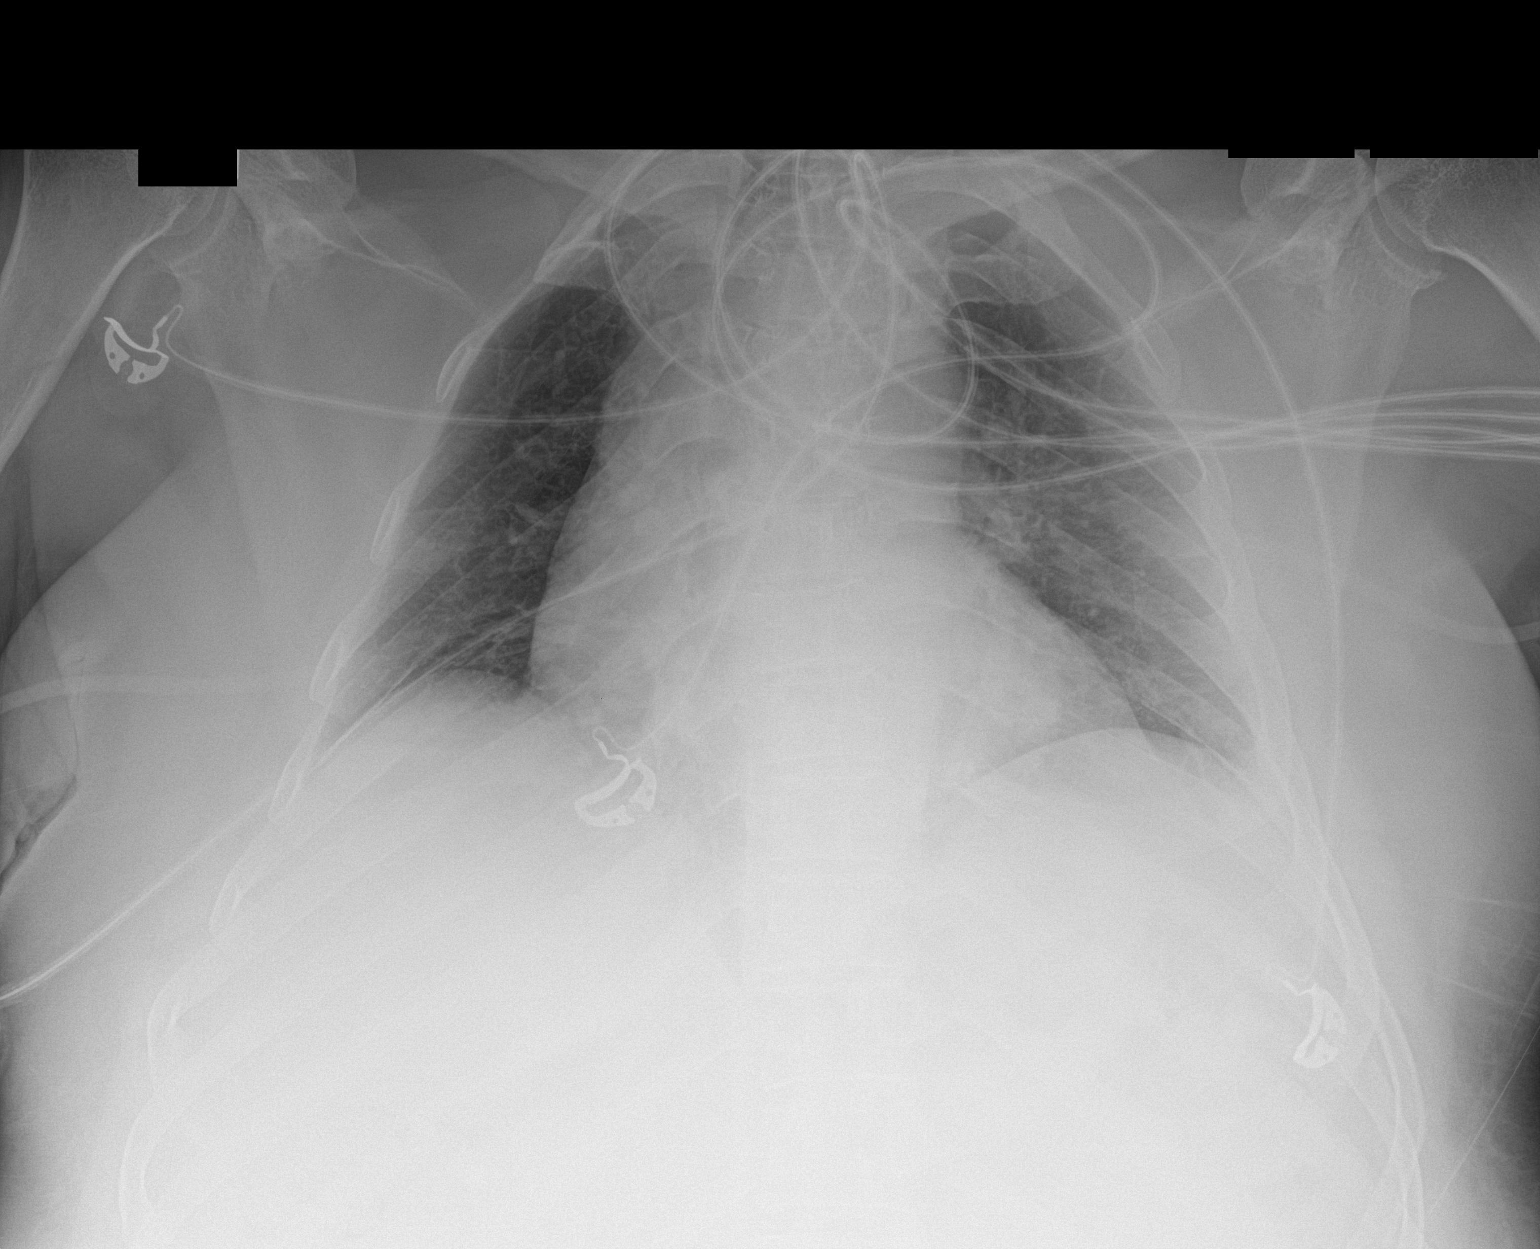

[1 of 1 positions shown; findings below may reference images not displayed]

FINDINGS: A single AP portable view of the chest demonstrates no focal
airspace consolidation or alveolar edema. The lungs are grossly
clear. There is no large effusion or pneumothorax. Cardiac and
mediastinal contours appear unremarkable.
IMPRESSION: No acute findings

## 2017-12-19 ENCOUNTER — Ambulatory Visit (HOSPITAL_COMMUNITY): Payer: BLUE CROSS/BLUE SHIELD | Attending: Cardiovascular Disease

## 2017-12-19 ENCOUNTER — Other Ambulatory Visit: Payer: Self-pay

## 2017-12-19 ENCOUNTER — Ambulatory Visit (INDEPENDENT_AMBULATORY_CARE_PROVIDER_SITE_OTHER): Payer: BLUE CROSS/BLUE SHIELD | Admitting: *Deleted

## 2017-12-19 DIAGNOSIS — I351 Nonrheumatic aortic (valve) insufficiency: Secondary | ICD-10-CM | POA: Diagnosis not present

## 2017-12-19 DIAGNOSIS — I77810 Thoracic aortic ectasia: Secondary | ICD-10-CM | POA: Insufficient documentation

## 2017-12-19 DIAGNOSIS — Z7901 Long term (current) use of anticoagulants: Secondary | ICD-10-CM | POA: Diagnosis not present

## 2017-12-19 DIAGNOSIS — E119 Type 2 diabetes mellitus without complications: Secondary | ICD-10-CM | POA: Diagnosis not present

## 2017-12-19 DIAGNOSIS — I513 Intracardiac thrombosis, not elsewhere classified: Secondary | ICD-10-CM

## 2017-12-19 DIAGNOSIS — I255 Ischemic cardiomyopathy: Secondary | ICD-10-CM | POA: Diagnosis not present

## 2017-12-19 DIAGNOSIS — I517 Cardiomegaly: Secondary | ICD-10-CM | POA: Diagnosis not present

## 2017-12-19 DIAGNOSIS — I252 Old myocardial infarction: Secondary | ICD-10-CM | POA: Insufficient documentation

## 2017-12-19 LAB — POCT INR: INR: 2.2

## 2017-12-19 NOTE — Patient Instructions (Signed)
Description   Continue taking 1 tablet daily except 2 tablets on Mondays and Fridays.  Recheck in 3 weeks. Main # [phone] or call coumadin clinic [phone]

## 2018-01-08 MED FILL — WARFARIN SODIUM 5 MG TABLET: 5 | 30 days supply | Qty: 45 | Fill #0

## 2018-01-16 ENCOUNTER — Ambulatory Visit (INDEPENDENT_AMBULATORY_CARE_PROVIDER_SITE_OTHER): Payer: BLUE CROSS/BLUE SHIELD | Admitting: *Deleted

## 2018-01-16 DIAGNOSIS — I513 Intracardiac thrombosis, not elsewhere classified: Secondary | ICD-10-CM | POA: Diagnosis not present

## 2018-01-16 DIAGNOSIS — Z7901 Long term (current) use of anticoagulants: Secondary | ICD-10-CM | POA: Diagnosis not present

## 2018-01-16 LAB — POCT INR: INR: 3 (ref 2.0–3.0)

## 2018-01-16 NOTE — Patient Instructions (Signed)
Description   Tomorrow take 1/2 tablet then while you are Fasting and not taking in any green leafy veggies take 1 tablet daily except 2 tablets on Mondays.  Original dose 1 tablet daily except 2 tablets on Monday and Fridays.  Recheck in 2 weeks. Main # [phone] or call coumadin clinic [phone]

## 2018-01-23 DIAGNOSIS — E113312 Type 2 diabetes mellitus with moderate nonproliferative diabetic retinopathy with macular edema, left eye: Secondary | ICD-10-CM | POA: Insufficient documentation

## 2018-01-26 ENCOUNTER — Ambulatory Visit: Payer: BLUE CROSS/BLUE SHIELD | Admitting: Vascular Surgery

## 2018-01-30 DIAGNOSIS — H2513 Age-related nuclear cataract, bilateral: Secondary | ICD-10-CM | POA: Insufficient documentation

## 2018-01-30 DIAGNOSIS — H25012 Cortical age-related cataract, left eye: Secondary | ICD-10-CM | POA: Insufficient documentation

## 2018-02-01 ENCOUNTER — Ambulatory Visit (INDEPENDENT_AMBULATORY_CARE_PROVIDER_SITE_OTHER): Payer: BLUE CROSS/BLUE SHIELD | Admitting: *Deleted

## 2018-02-01 DIAGNOSIS — I513 Intracardiac thrombosis, not elsewhere classified: Secondary | ICD-10-CM

## 2018-02-01 DIAGNOSIS — Z7901 Long term (current) use of anticoagulants: Secondary | ICD-10-CM | POA: Diagnosis not present

## 2018-02-01 LAB — POCT INR: INR: 1.8 — AB (ref 2.0–3.0)

## 2018-02-01 NOTE — Patient Instructions (Signed)
Description   Today take an extra 1/2 tablet, then Resume your original dose 1 tablet daily except 2 tablets on Monday and Fridays.  Recheck in 3 weeks. Main # [phone] or call coumadin clinic [phone]

## 2018-02-13 ENCOUNTER — Encounter: Payer: Self-pay | Admitting: Vascular Surgery

## 2018-02-13 ENCOUNTER — Ambulatory Visit (INDEPENDENT_AMBULATORY_CARE_PROVIDER_SITE_OTHER): Payer: BLUE CROSS/BLUE SHIELD | Admitting: Vascular Surgery

## 2018-02-13 VITALS — BP 120/80 | HR 69 | Temp 98.0°F | Resp 16 | Ht 68.0 in | Wt 201.0 lb

## 2018-02-13 DIAGNOSIS — Z9889 Other specified postprocedural states: Secondary | ICD-10-CM

## 2018-02-13 NOTE — Progress Notes (Signed)
Vascular and Vein Specialist of [facility]  Patient name: Troy Yu MRN: [id number] DOB: 06/03/1961 Sex: male  REASON FOR VISIT: Discuss questions regarding right lower extremity  HPI: Troy Yu is a 57 y.o. male with a complex past history regarding his right lower extremity.  He had presented in June 2017 with critical limb ischemia with acute thrombosis from embolus of his right lower extremity.  He underwent embolectomy and 4 compartment fasciotomy with Dr. Claudie Fisherman.  Long protracted medical course but eventually had closure of his fasciotomies after primary closure of some segments and VAC dressing on the others.  He is maintained on Coumadin.  He does not note any particular swelling but reports a tight sensation around his distal right ankle.  Past Medical History:  Diagnosis Date  . Diabetes (HCC)   . LV (left ventricular) mural thrombus   . NSTEMI (non-ST elevated myocardial infarction) (HCC)     Family History  Problem Relation Age of Onset  . Heart Problems Father   . Heart Problems Mother     SOCIAL HISTORY: Social History   Tobacco Use  . Smoking status: Never Smoker  . Smokeless tobacco: Never Used  Substance Use Topics  . Alcohol use: No    No Known Allergies  Current Outpatient Medications  Medication Sig Dispense Refill  . atorvastatin (LIPITOR) 20 MG tablet Take 1 tablet (20 mg total) by mouth daily. 90 tablet 3  . carvedilol (COREG) 6.25 MG tablet Take 1 tablet (6.25 mg total) by mouth 2 (two) times daily with a meal. Please keep upcoming appt for future refills. Thank you 180 tablet 3  . warfarin (COUMADIN) 5 MG tablet TAKE AS DIRECTED BY COUMADIN CLINIC 45 tablet 1   No current facility-administered medications for this visit.     REVIEW OF SYSTEMS:  [X]  denotes positive finding, [ ]  denotes negative finding Cardiac  Comments:  Chest pain or chest pressure:    Shortness of breath upon exertion:      Short of breath when lying flat:    Irregular heart rhythm:        Vascular    Pain in calf, thigh, or hip brought on by ambulation:    Pain in feet at night that wakes you up from your sleep:     Blood clot in your veins:    Leg swelling:           PHYSICAL EXAM: Vitals:   02/13/18 1317  BP: 120/80  Pulse: 69  Resp: 16  Temp: 98 F (36.7 C)  SpO2: 98%  Weight: 201 lb (91.2 kg)  Height: 5\' 8"  (1.727 m)    GENERAL: The patient is a well-nourished male, in no acute distress. The vital signs are documented above. CARDIOVASCULAR: 2-3+ right dorsalis pedis pulse PULMONARY: There is good air exchange  MUSCULOSKELETAL: There are no major deformities or cyanosis. NEUROLOGIC: No focal weakness or paresthesias are detected. SKIN: There are no ulcers or rashes noted.  Lateral and medial fasciotomy is completely healed with some roughness of the skin but no breakdown PSYCHIATRIC: The patient has a normal affect.  DATA:  None  MEDICAL ISSUES: Result for limb salvaging embolectomy and fasciotomies with eventual closure.  I again discussed with the patient the critical need for long-term anticoagulation.  That he has had a excellent result and would not recommend any ongoing follow-up.  He reports that he is having some infections in his teeth and is requesting antibiotics.  I have deferred explaining  I did not know the appropriate treatment for this and referred him back to his primary care provider.  He will see [place] again on an as-needed basis    Larina Earthly, MD [facility] Vascular and Vein Specialists of [facility] Tel [phone] Pager [phone]

## 2018-02-16 MED FILL — ATORVASTATIN CALCIUM 20 MG: 20 | 90 days supply | Qty: 90 | Fill #1

## 2018-02-16 MED FILL — CARVEDILOL 6.25 MG TABS: 6.25 | 90 days supply | Qty: 180 | Fill #1

## 2018-02-16 MED FILL — WARFARIN SODIUM 5 MG TABLET: 5 | 30 days supply | Qty: 45 | Fill #1

## 2018-03-02 DIAGNOSIS — K029 Dental caries, unspecified: Secondary | ICD-10-CM | POA: Insufficient documentation

## 2018-03-02 MED FILL — AMOX-CLAV 875-125 MG TABLET: 875-125 | 10 days supply | Qty: 20 | Fill #0

## 2018-03-26 ENCOUNTER — Other Ambulatory Visit: Payer: Self-pay | Admitting: Cardiovascular Disease

## 2018-03-28 ENCOUNTER — Encounter: Payer: Self-pay | Admitting: Cardiovascular Disease

## 2018-03-30 ENCOUNTER — Other Ambulatory Visit: Payer: Self-pay

## 2018-03-30 ENCOUNTER — Ambulatory Visit (INDEPENDENT_AMBULATORY_CARE_PROVIDER_SITE_OTHER): Payer: BLUE CROSS/BLUE SHIELD | Admitting: *Deleted

## 2018-03-30 DIAGNOSIS — I513 Intracardiac thrombosis, not elsewhere classified: Secondary | ICD-10-CM | POA: Diagnosis not present

## 2018-03-30 DIAGNOSIS — Z7901 Long term (current) use of anticoagulants: Secondary | ICD-10-CM | POA: Diagnosis not present

## 2018-03-30 LAB — POCT INR: INR: 1.7 — AB (ref 2.0–3.0)

## 2018-03-30 MED FILL — WARFARIN SODIUM 5 MG TABLET: 5 | 30 days supply | Qty: 40 | Fill #0

## 2018-03-30 NOTE — Patient Instructions (Signed)
Description   Today take an extra 1/2 tablet, then Resume your original dose 1 tablet daily except 2 tablets on Monday and Fridays.  Recheck in 2 weeks. Main # [phone] or call coumadin clinic [phone]

## 2018-03-30 NOTE — Telephone Encounter (Signed)
Telephoned pt he answered and then call lost. Telephoned pt back and call went straight to voicemail. Left voice message that he has missed several appts, has been 4 no shows and he needs to come in for INR check as we need to know the INR.

## 2018-03-30 NOTE — Telephone Encounter (Signed)
Pt is requesting a refill on coumadin. Please address. Thank You.

## 2018-04-17 DIAGNOSIS — E11311 Type 2 diabetes mellitus with unspecified diabetic retinopathy with macular edema: Secondary | ICD-10-CM | POA: Insufficient documentation

## 2018-04-19 ENCOUNTER — Ambulatory Visit (INDEPENDENT_AMBULATORY_CARE_PROVIDER_SITE_OTHER): Payer: BLUE CROSS/BLUE SHIELD | Admitting: Pharmacist

## 2018-04-19 DIAGNOSIS — I513 Intracardiac thrombosis, not elsewhere classified: Secondary | ICD-10-CM | POA: Diagnosis not present

## 2018-04-19 DIAGNOSIS — Z7901 Long term (current) use of anticoagulants: Secondary | ICD-10-CM

## 2018-04-19 LAB — POCT INR: INR: 1.4 — AB (ref 2.0–3.0)

## 2018-04-19 NOTE — Patient Instructions (Addendum)
  Description   Take an extra tablet today and take 3 tablets tomorrow, then start taking 1 tablet daily except 2 tablets on Mondays, Wednesdays, and Fridays.  Recheck in 2 weeks. Main # [phone] or call coumadin clinic [phone]

## 2018-04-27 ENCOUNTER — Other Ambulatory Visit: Payer: Self-pay | Admitting: Cardiovascular Disease

## 2018-04-27 MED FILL — WARFARIN SODIUM 5 MG TABLET: 5 | 30 days supply | Qty: 40 | Fill #0

## 2018-05-10 ENCOUNTER — Ambulatory Visit (INDEPENDENT_AMBULATORY_CARE_PROVIDER_SITE_OTHER): Payer: BLUE CROSS/BLUE SHIELD | Admitting: *Deleted

## 2018-05-10 DIAGNOSIS — I513 Intracardiac thrombosis, not elsewhere classified: Secondary | ICD-10-CM

## 2018-05-10 DIAGNOSIS — Z7901 Long term (current) use of anticoagulants: Secondary | ICD-10-CM

## 2018-05-10 LAB — POCT INR: INR: 2 (ref 2.0–3.0)

## 2018-05-10 NOTE — Patient Instructions (Signed)
Description   Continue taking 1 tablet daily except 2 tablets on Mondays, Wednesdays, and Fridays.  Recheck in 3 weeks. Main # [phone] or call coumadin clinic [phone]

## 2018-05-23 ENCOUNTER — Ambulatory Visit: Payer: BLUE CROSS/BLUE SHIELD | Admitting: Cardiovascular Disease

## 2018-05-25 ENCOUNTER — Other Ambulatory Visit: Payer: Self-pay | Admitting: Cardiovascular Disease

## 2018-05-31 ENCOUNTER — Ambulatory Visit (INDEPENDENT_AMBULATORY_CARE_PROVIDER_SITE_OTHER): Payer: BLUE CROSS/BLUE SHIELD | Admitting: Pharmacist

## 2018-05-31 DIAGNOSIS — I513 Intracardiac thrombosis, not elsewhere classified: Secondary | ICD-10-CM | POA: Diagnosis not present

## 2018-05-31 DIAGNOSIS — Z7901 Long term (current) use of anticoagulants: Secondary | ICD-10-CM | POA: Diagnosis not present

## 2018-05-31 LAB — POCT INR: INR: 1.8 — AB (ref 2.0–3.0)

## 2018-05-31 NOTE — Patient Instructions (Signed)
Description   Take an extra tablet today, then continue taking 1 tablet daily except 2 tablets on Mondays, Wednesdays, and Fridays.  Recheck in 3 weeks. Main # [phone] or call coumadin clinic [phone]

## 2018-06-18 ENCOUNTER — Other Ambulatory Visit: Payer: Self-pay | Admitting: Cardiovascular Disease

## 2018-06-18 MED FILL — WARFARIN SODIUM 5 MG TABLET: 5 | 30 days supply | Qty: 40 | Fill #0

## 2018-06-18 MED FILL — CARVEDILOL 6.25 MG TABLET: 6.25 | 90 days supply | Qty: 180 | Fill #0

## 2018-06-18 MED FILL — ATORVASTATIN CALCIUM 20 MG: 20 | 90 days supply | Qty: 90 | Fill #0

## 2018-06-25 ENCOUNTER — Ambulatory Visit (INDEPENDENT_AMBULATORY_CARE_PROVIDER_SITE_OTHER): Payer: BLUE CROSS/BLUE SHIELD | Admitting: *Deleted

## 2018-06-25 DIAGNOSIS — Z7901 Long term (current) use of anticoagulants: Secondary | ICD-10-CM | POA: Diagnosis not present

## 2018-06-25 DIAGNOSIS — I513 Intracardiac thrombosis, not elsewhere classified: Secondary | ICD-10-CM

## 2018-06-25 LAB — POCT INR: INR: 1.8 — AB (ref 2.0–3.0)

## 2018-06-25 NOTE — Patient Instructions (Signed)
Description   Today take 1/2 tablet when you get home then start taking 2 tablets daily except 1 tablet on Sundays, Tuesdays, and Thursdays.  Recheck in 2 weeks.  Main # [phone] or call coumadin clinic [phone]

## 2018-07-04 NOTE — Progress Notes (Signed)
CARDIOLOGY OFFICE NOTE  Date:  07/12/2018    [name] Date of Birth: 10-12-1960 Medical Record #[id number]  PCP:  Loyal Jacobson, MD  Cardiologist:  Eden Emms  No chief complaint on file.   History of Present Illness: 57 y.o. seen initially in ER June 2017 with subacute anterior MI complicated by distal embolization to right leg. Had a complicated RLE embolectomy requiring fasciotomy by Dr Imogene Burn with long healing process including wound Vac. Had DES to distal RCA and mid LAD with EF 30-35% large apical thrombus. Diagnosed with DM Has refused a lot of meds and care.   Still wants to stop meds Discussed importance of ACE  But he never filled script Seen by Dr Arbie Cookey June 2019 told to stay on life long coumadin   "I think I'm doing ok and don't want to take unnecessary medication"  59 yo daughter and 2 yo son living with him Ex wife went back to [place]  INR Rx today at 2.3   Thinking of going to [place] to find a new wife  Past Medical History:  Diagnosis Date  . Diabetes (HCC)   . LV (left ventricular) mural thrombus   . NSTEMI (non-ST elevated myocardial infarction) [facility])     Past Surgical History:  Procedure Laterality Date  . APPLICATION OF WOUND VAC Right 02/22/2016   Procedure: APPLICATION OF Negative Pressure Dressing Right Lower Leg;  Surgeon: Fransisco Hertz, MD;  Location: [facility] OR;  Service: Vascular;  Laterality: Right;  . APPLICATION OF WOUND VAC Right 02/26/2016   Procedure: APPLICATION OF WOUND VAC, RIGHT MEDIAL LOWER LEG FASCIOTOMY SITE;  Surgeon: Larina Earthly, MD;  Location: [facility] OR;  Service: Vascular;  Laterality: Right;  . CARDIAC CATHETERIZATION N/A 03/09/2016   Procedure: Coronary Stent Intervention;  Surgeon: Iran Ouch, MD;  Location: [facility] INVASIVE CV LAB;  Service: Cardiovascular;  Laterality: N/A;  distal RCA mid LAD  . CARDIAC CATHETERIZATION N/A 03/09/2016   Procedure: Intravascular Pressure Wire/FFR Study;  Surgeon: Iran Ouch, MD;   Location: [facility] INVASIVE CV LAB;  Service: Cardiovascular;  Laterality: N/A;  mid LAD  . CARDIAC CATHETERIZATION N/A 03/09/2016   Procedure: Left Heart Cath and Coronary Angiography;  Surgeon: Iran Ouch, MD;  Location: [facility] INVASIVE CV LAB;  Service: Cardiovascular;  Laterality: N/A;  . EMBOLECTOMY Right 02/21/2016   Procedure: EMBOLECTOMY RIGHT COMMON FEMORAL ARTERY; FOUR COMPARTMENT FASCIOTOMY;  Surgeon: Fransisco Hertz, MD;  Location: [facility] OR;  Service: Vascular;  Laterality: Right;  . FASCIOTOMY CLOSURE Right 02/26/2016   Procedure: RIGHT LATERAL LOWER LEG FASCIOTOMY CLOSURE;  Surgeon: Larina Earthly, MD;  Location: [facility] OR;  Service: Vascular;  Laterality: Right;  . HEMATOMA EVACUATION Right 02/22/2016   Procedure: EVACUATION HEMATOMA With Placement of Negative Pressure Dressing;  Surgeon: Fransisco Hertz, MD;  Location: [facility] OR;  Service: Vascular;  Laterality: Right;     Medications: Current Outpatient Medications  Medication Sig Dispense Refill  . atorvastatin (LIPITOR) 20 MG tablet Take 1 tablet (20 mg total) by mouth daily. 90 tablet 3  . carvedilol (COREG) 6.25 MG tablet Take 1 tablet (6.25 mg total) by mouth 2 (two) times daily with a meal. Please keep upcoming appt for future refills. Thank you 180 tablet 3  . sitaGLIPtin (JANUVIA) 100 MG tablet Take 100 mg by mouth daily.    Marland Kitchen warfarin (COUMADIN) 5 MG tablet TAKE BY MOUTH AS DIRECTED BY COUMADIN CLINIC 40 tablet 0   No current facility-administered medications for  this visit.     Allergies: Allergies  Allergen Reactions  . Metformin     Other reaction(s): Dizziness (intolerance)    Social History: The patient  reports that he has never smoked. He has never used smokeless tobacco. He reports that he does not drink alcohol or use drugs.   Family History: The patient's family history includes Heart Problems in his father and mother.   Review of Systems: Please see the history of present illness.   Otherwise, the review of systems is  positive for none.   All other systems are reviewed and negative.   Physical Exam: VS:  BP 110/68   Pulse 84   Ht 5\' 8"  (1.727 m)   Wt 209 lb 8 oz (95 kg)   SpO2 98%   BMI 31.85 kg/m  .  BMI Body mass index is 31.85 kg/m.  Wt Readings from Last 3 Encounters:  07/12/18 209 lb 8 oz (95 kg)  02/13/18 201 lb (91.2 kg)  12/01/17 208 lb 8 oz (94.6 kg)   Affect appropriate Healthy:  appears stated age HEENT: normal Neck supple with no adenopathy JVP normal no bruits no thyromegaly Lungs clear with no wheezing and good diaphragmatic motion Heart:  S1/S2 no murmur, no rub, gallop or click PMI enlarged  Abdomen: benighn, BS positve, no tenderness, no AAA no bruit.  No HSM or HJR Distal pulses intact with no bruits No edema Neuro non-focal Skin warm and dry No muscular weakness Fasciotomy scars RLE with some foot drop      LABORATORY DATA:  EKG:   .03/21/16 SR old IMI and old anterior MI 04/20/17 SR old IMI/AMI no acute changes   Lab Results  Component Value Date   WBC 7.1 04/04/2016   HGB 11.6 (L) 04/04/2016   HCT 36.3 (L) 04/04/2016   PLT 402 (H) 04/04/2016   GLUCOSE 264 (H) 04/04/2016   CHOL 116 02/23/2016   TRIG 130 02/23/2016   HDL 30 (L) 02/23/2016   LDLCALC 60 02/23/2016   ALT 23 04/04/2016   AST 20 04/04/2016   NA 136 04/04/2016   K 4.3 04/04/2016   CL 101 04/04/2016   CREATININE 0.90 04/04/2016   BUN 13 04/04/2016   CO2 24 04/04/2016   TSH 3.200 03/03/2016   INR 2.3 07/12/2018   HGBA1C 5.9 (H) 06/01/2016    BNP (last 3 results) No results for input(s): BNP in the last 8760 hours.  ProBNP (last 3 results) No results for input(s): PROBNP in the last 8760 hours.   Other Studies Reviewed Today:  Cardiac Cath Conclusion from 02/2016    Prox RCA lesion, 10% stenosed.  Dist LAD lesion, 60% stenosed.  Dist RCA lesion, 90% stenosed. Post intervention, there is a 0% residual stenosis.  Mid LAD lesion, 60% stenosed. Post intervention, there is a 0%  residual stenosis.  1. Significant 2 vessel coronary artery disease involving the distal right coronary artery and mid LAD. The culprit for recent myocardial infarction seems to be the mid LAD stenosis which appears to be thrombotic and hazy in spite of being only 60% in severity. However, this was significant by FFR.  2. Moderately elevated left ventricular end-diastolic pressure. LV angiography was not performed due to LV thrombus.  3. Successful angioplasty and drug-eluting stent placement to the distal right coronary artery and mid left anterior descending artery.   Recommendations: Recommend treatment with aspirin, Plavix and warfarin for one month. After one month, aspirin can be discontinued. Heparin can be resumed  today 8 hours after sheath pull. Warfarin can be started. I switched from propranolol to carvedilol. Continue treatment for cardiomyopathy and add an ACE inhibitor or ARB before hospital discharge. There was residual 60% distal LAD stenosis which was left to be treated medically.   Echo Study Conclusions from 01/2016  - Left ventricle: Anteroapical infarct with large thrombus burden at apex. Mobile and high embolic potential. The cavity size was severely dilated. Wall thickness was normal. Systolic function was moderately to severely reduced. The estimated ejection fraction was in the range of 30% to 35%. - Atrial septum: No defect or patent foramen ovale was identified.  Assessment/Plan: 1. Anterior MI - July 2017  with associated LV dysfunction. Cardiac catheterization with DES to distal RCA and Mid LAD. Residual CAD noted. Would plan to treat medically.  81 mg ASA  2. Ischemic CM - with EF of 30 to 35%. This improved to 35-40% by TTE done 12/19/17 no residual apical thrombus Not compliant With meds and not taking ARB/ACE    3. LV (left ventricular) mural thrombus (HCC) with embolization of right femoral artery resulting in limb ischemia s/p  thrombectomy and fasciotomy with post operative course complicated by hematoma and bleeding s/p evacuation of hematoma with application of a VAC in the setting of therapeutic anti-coagulation. Seen by Dr. Arbie Cookey. Finally healed  TTE 12/19/17 thrombus resolved but maintained on coumadin per [facility]  4. Newly diagnosed diabetes mellitus/Hyperglycemia -Hemoglobin A1c 9.2. f/u with primary currently not taking any meds  -  6. PVD:   Still with foot drop. F/u with [facility] Dr Early   7. Cholesterol:  At goal he insists on cutting lipitor back to 20 mg f/u labs 3 months   Lab Results  Component Value Date   LDLCALC 60 02/23/2016       Charlton Haws

## 2018-07-12 ENCOUNTER — Encounter: Payer: Self-pay | Admitting: Cardiovascular Disease

## 2018-07-12 ENCOUNTER — Ambulatory Visit (INDEPENDENT_AMBULATORY_CARE_PROVIDER_SITE_OTHER): Payer: BLUE CROSS/BLUE SHIELD | Admitting: Cardiovascular Disease

## 2018-07-12 ENCOUNTER — Ambulatory Visit (INDEPENDENT_AMBULATORY_CARE_PROVIDER_SITE_OTHER): Payer: BLUE CROSS/BLUE SHIELD

## 2018-07-12 VITALS — BP 110/68 | HR 84 | Ht 68.0 in | Wt 209.5 lb

## 2018-07-12 DIAGNOSIS — E785 Hyperlipidemia, unspecified: Secondary | ICD-10-CM | POA: Diagnosis not present

## 2018-07-12 DIAGNOSIS — I739 Peripheral vascular disease, unspecified: Secondary | ICD-10-CM | POA: Diagnosis not present

## 2018-07-12 DIAGNOSIS — Z7901 Long term (current) use of anticoagulants: Secondary | ICD-10-CM

## 2018-07-12 DIAGNOSIS — I255 Ischemic cardiomyopathy: Secondary | ICD-10-CM

## 2018-07-12 DIAGNOSIS — I513 Intracardiac thrombosis, not elsewhere classified: Secondary | ICD-10-CM | POA: Diagnosis not present

## 2018-07-12 DIAGNOSIS — I2510 Atherosclerotic heart disease of native coronary artery without angina pectoris: Secondary | ICD-10-CM | POA: Diagnosis not present

## 2018-07-12 LAB — POCT INR: INR: 2.3 (ref 2.0–3.0)

## 2018-07-12 NOTE — Patient Instructions (Addendum)
Description   Continue  taking 2 tablets daily except 1 tablet on Sundays, Tuesdays, and Thursdays.  Recheck in 4 weeks.  Main # [phone] or call coumadin clinic [phone]

## 2018-07-12 NOTE — Patient Instructions (Signed)

## 2018-07-19 ENCOUNTER — Other Ambulatory Visit: Payer: Self-pay | Admitting: Cardiovascular Disease

## 2018-07-19 MED FILL — WARFARIN SODIUM 5 MG TABLET: 5 | 30 days supply | Qty: 50 | Fill #0

## 2018-08-16 MED FILL — AMOXICILLIN 875 MG TABS: 875 | 10 days supply | Qty: 20 | Fill #0

## 2018-08-16 MED FILL — WARFARIN SODIUM 5 MG TABLET: 5 | 30 days supply | Qty: 50 | Fill #1

## 2018-08-21 MED FILL — ATORVASTATIN CALCIUM 40 MG: 40 | 90 days supply | Qty: 90 | Fill #0

## 2018-08-24 ENCOUNTER — Ambulatory Visit (INDEPENDENT_AMBULATORY_CARE_PROVIDER_SITE_OTHER): Payer: BLUE CROSS/BLUE SHIELD | Admitting: *Deleted

## 2018-08-24 DIAGNOSIS — I513 Intracardiac thrombosis, not elsewhere classified: Secondary | ICD-10-CM

## 2018-08-24 DIAGNOSIS — Z7901 Long term (current) use of anticoagulants: Secondary | ICD-10-CM

## 2018-08-24 LAB — POCT INR: INR: 2.9 (ref 2.0–3.0)

## 2018-08-24 MED ORDER — WARFARIN SODIUM 5 MG PO TABS: ORAL_TABLET | ORAL | 0 refills | Status: DC

## 2018-08-24 MED FILL — CARVEDILOL 6.25 MG TABLET: 6.25 | 90 days supply | Qty: 180 | Fill #1

## 2018-08-24 MED FILL — ATORVASTATIN CALCIUM 20 MG: 20 | 90 days supply | Qty: 90 | Fill #1

## 2018-08-24 MED FILL — WARFARIN SODIUM 5 MG TABLET: 5 | 30 days supply | Qty: 50 | Fill #0

## 2018-08-24 NOTE — Patient Instructions (Signed)
Description   Continue  taking 2 tablets daily except 1 tablet on Sundays, Tuesdays, and Thursdays.  Recheck in 4 weeks.  Main # [phone] or call coumadin clinic [phone]      

## 2018-09-21 ENCOUNTER — Ambulatory Visit (INDEPENDENT_AMBULATORY_CARE_PROVIDER_SITE_OTHER): Payer: BLUE CROSS/BLUE SHIELD

## 2018-09-21 DIAGNOSIS — Z7901 Long term (current) use of anticoagulants: Secondary | ICD-10-CM | POA: Diagnosis not present

## 2018-09-21 DIAGNOSIS — I513 Intracardiac thrombosis, not elsewhere classified: Secondary | ICD-10-CM | POA: Diagnosis not present

## 2018-09-21 LAB — POCT INR: INR: 3.1 — AB (ref 2.0–3.0)

## 2018-09-21 NOTE — Patient Instructions (Signed)
Description   Take 1 tablet tomorrow, then resume same dosage 2 tablets daily except 1 tablet on Sundays, Tuesdays, and Thursdays.  Recheck in 4 weeks.  Main # [phone] or call coumadin clinic [phone]

## 2018-10-24 ENCOUNTER — Ambulatory Visit (INDEPENDENT_AMBULATORY_CARE_PROVIDER_SITE_OTHER): Payer: BLUE CROSS/BLUE SHIELD | Admitting: Pharmacist

## 2018-10-24 DIAGNOSIS — Z7901 Long term (current) use of anticoagulants: Secondary | ICD-10-CM

## 2018-10-24 DIAGNOSIS — I513 Intracardiac thrombosis, not elsewhere classified: Secondary | ICD-10-CM

## 2018-10-24 LAB — POCT INR: INR: 2.1 (ref 2.0–3.0)

## 2018-10-24 NOTE — Patient Instructions (Signed)
Continue same dosage 2 tablets daily except 1 tablet on Sundays, Tuesdays, and Thursdays.  Recheck in 4 weeks.  Main # [phone] or call coumadin clinic [phone]

## 2018-10-26 ENCOUNTER — Other Ambulatory Visit: Payer: Self-pay | Admitting: Cardiovascular Disease

## 2018-10-26 MED FILL — WARFARIN SODIUM 5 MG TABLET: 5 | 30 days supply | Qty: 50 | Fill #0

## 2018-11-23 ENCOUNTER — Telehealth: Payer: Self-pay

## 2018-11-23 NOTE — Telephone Encounter (Signed)
lmom for prescreen and drive thru 

## 2018-11-30 MED FILL — JANUVIA 100 MG TABLET: 100 | 30 days supply | Qty: 30 | Fill #0

## 2018-11-30 MED FILL — WARFARIN SODIUM 5 MG TABLET: 5 | 30 days supply | Qty: 50 | Fill #1

## 2019-01-07 ENCOUNTER — Telehealth: Payer: Self-pay | Admitting: Pharmacist

## 2019-01-07 ENCOUNTER — Other Ambulatory Visit: Payer: Self-pay | Admitting: *Deleted

## 2019-01-07 ENCOUNTER — Other Ambulatory Visit: Payer: Self-pay | Admitting: Cardiovascular Disease

## 2019-01-07 MED FILL — JANUVIA 100 MG TABLET: 100 | 30 days supply | Qty: 30 | Fill #1

## 2019-01-07 MED FILL — WARFARIN SODIUM 5 MG TABLET: 5 | 20 days supply | Qty: 20 | Fill #0

## 2019-01-07 MED FILL — CARVEDILOL 6.25 MG TABLET: 6.25 | 90 days supply | Qty: 180 | Fill #0

## 2019-01-07 MED FILL — ATORVASTATIN 20 MG TABLET: 20 | 90 days supply | Qty: 90 | Fill #0

## 2019-01-07 NOTE — Telephone Encounter (Signed)
Patient overdue for INR check. States he has been out for 2 days. Will give 2 weeks worth of medication and have scheduled patient to come for INR check next thrusday 5/21. Further refills will be given after appointment

## 2019-01-07 NOTE — Telephone Encounter (Signed)

## 2019-01-08 ENCOUNTER — Other Ambulatory Visit: Payer: Self-pay | Admitting: Cardiovascular Disease

## 2019-01-09 NOTE — Telephone Encounter (Signed)
Pt is overdue for follow-up, pt's last INr was 10/24/18 and he was due to f/u 11/16/18. Refill for 20 tablets done on 01/07/19 then pt cancelled appt for 5/12 and 5/21.  Pt has been given temporary fills and multiple appts which he has chosen to cancel.  Pt will need appt for any further refills.

## 2019-01-10 ENCOUNTER — Ambulatory Visit (INDEPENDENT_AMBULATORY_CARE_PROVIDER_SITE_OTHER): Payer: BLUE CROSS/BLUE SHIELD | Admitting: Pharmacist

## 2019-01-10 ENCOUNTER — Other Ambulatory Visit: Payer: Self-pay

## 2019-01-10 DIAGNOSIS — I513 Intracardiac thrombosis, not elsewhere classified: Secondary | ICD-10-CM | POA: Diagnosis not present

## 2019-01-10 DIAGNOSIS — Z7901 Long term (current) use of anticoagulants: Secondary | ICD-10-CM | POA: Diagnosis not present

## 2019-01-10 LAB — POCT INR: INR: 1.5 — AB (ref 2.0–3.0)

## 2019-01-10 MED ORDER — WARFARIN SODIUM 5 MG PO TABS: ORAL_TABLET | ORAL | 1 refills | Status: DC

## 2019-01-11 ENCOUNTER — Other Ambulatory Visit: Payer: Self-pay

## 2019-01-16 MED FILL — WARFARIN SODIUM 5 MG TABLET: 5 | 30 days supply | Qty: 50 | Fill #0

## 2019-01-28 ENCOUNTER — Telehealth: Payer: Self-pay

## 2019-01-28 NOTE — Telephone Encounter (Signed)
lmom for prescreen  

## 2019-02-08 MED FILL — JANUVIA 100 MG TABLET: 100 | 30 days supply | Qty: 30 | Fill #2

## 2019-02-21 MED FILL — WARFARIN SODIUM 5 MG TABLET: 5 | 30 days supply | Qty: 50 | Fill #1

## 2019-03-11 MED FILL — JANUVIA 100 MG TABLET: 100 | 30 days supply | Qty: 30 | Fill #0

## 2019-03-12 ENCOUNTER — Telehealth: Payer: Self-pay

## 2019-03-12 NOTE — Telephone Encounter (Signed)
lmom for prescreen  

## 2019-03-27 ENCOUNTER — Other Ambulatory Visit: Payer: Self-pay | Admitting: Cardiovascular Disease

## 2019-03-27 NOTE — Telephone Encounter (Signed)
Pt is overdue for follow-up, missed multiple scheduled appts.  Last seen 01/10/19.  Pt has appt on 03/28/19 will refill rx after appt, given hx of non-compliance.

## 2019-03-29 MED FILL — WARFARIN SODIUM 5 MG TABLET: 5 | 25 days supply | Qty: 50 | Fill #0

## 2019-04-08 ENCOUNTER — Telehealth: Payer: Self-pay

## 2019-04-08 NOTE — Telephone Encounter (Signed)
lmom for overdue inr 

## 2019-04-09 ENCOUNTER — Ambulatory Visit (INDEPENDENT_AMBULATORY_CARE_PROVIDER_SITE_OTHER): Payer: BLUE CROSS/BLUE SHIELD | Admitting: Pharmacist

## 2019-04-09 ENCOUNTER — Other Ambulatory Visit: Payer: Self-pay

## 2019-04-09 DIAGNOSIS — Z7901 Long term (current) use of anticoagulants: Secondary | ICD-10-CM

## 2019-04-09 DIAGNOSIS — I513 Intracardiac thrombosis, not elsewhere classified: Secondary | ICD-10-CM | POA: Diagnosis not present

## 2019-04-09 LAB — POCT INR: INR: 2.9 (ref 2.0–3.0)

## 2019-04-09 MED ORDER — WARFARIN SODIUM 5 MG PO TABS: ORAL_TABLET | ORAL | 0 refills | Status: DC

## 2019-04-09 NOTE — Patient Instructions (Signed)
Continue same dosage 2 tablets daily except 1 tablet on Sundays, Tuesdays, and Thursdays.  Recommended rechecking in 3 weeks.  Main # [phone] or call coumadin clinic [phone]

## 2019-04-12 MED FILL — JANUVIA 100 MG TABLET: 100 | 40 days supply | Qty: 40 | Fill #0

## 2019-04-17 MED FILL — WARFARIN SODIUM 5 MG TABLET: 5 | 25 days supply | Qty: 50 | Fill #0

## 2019-05-01 MED FILL — AMOXICILLIN 500 MG CAPSULE: 500 | 10 days supply | Qty: 30 | Fill #0

## 2019-05-04 MED FILL — WARFARIN SODIUM 5 MG TABLET: 5 | 30 days supply | Qty: 50 | Fill #0

## 2019-05-07 ENCOUNTER — Other Ambulatory Visit: Payer: Self-pay | Admitting: *Deleted

## 2019-05-07 MED ORDER — WARFARIN SODIUM 5 MG PO TABS: ORAL_TABLET | ORAL | 0 refills | Status: DC

## 2019-05-07 MED FILL — CARVEDILOL 6.25 MG TABLET: 6.25 | 90 days supply | Qty: 180 | Fill #1

## 2019-05-07 NOTE — Telephone Encounter (Signed)
Pt missed appointment today and called to  reschedule appointment for tomorrow. Pt stated he was out of coumadin. Will give pt a 1 month supply.

## 2019-05-08 ENCOUNTER — Ambulatory Visit (INDEPENDENT_AMBULATORY_CARE_PROVIDER_SITE_OTHER): Payer: BLUE CROSS/BLUE SHIELD | Admitting: *Deleted

## 2019-05-08 ENCOUNTER — Other Ambulatory Visit: Payer: Self-pay

## 2019-05-08 DIAGNOSIS — Z7901 Long term (current) use of anticoagulants: Secondary | ICD-10-CM | POA: Diagnosis not present

## 2019-05-08 DIAGNOSIS — I513 Intracardiac thrombosis, not elsewhere classified: Secondary | ICD-10-CM | POA: Diagnosis not present

## 2019-05-08 LAB — POCT INR: INR: 1.7 — AB (ref 2.0–3.0)

## 2019-05-08 NOTE — Patient Instructions (Signed)
Description   Today take 2.5 tablets then continue same dosage 2 tablets daily except 1 tablet on Sundays, Tuesdays, and Thursdays.  Recommended rechecking in 3 weeks. Was advised this was against our recommendations.  Main # [phone] or call coumadin clinic [phone]

## 2019-05-23 MED FILL — JANUVIA 100 MG TABLET: 100 | 30 days supply | Qty: 30 | Fill #0

## 2019-05-30 ENCOUNTER — Other Ambulatory Visit: Payer: Self-pay

## 2019-05-30 ENCOUNTER — Ambulatory Visit (INDEPENDENT_AMBULATORY_CARE_PROVIDER_SITE_OTHER): Payer: BLUE CROSS/BLUE SHIELD | Admitting: *Deleted

## 2019-05-30 DIAGNOSIS — Z7901 Long term (current) use of anticoagulants: Secondary | ICD-10-CM

## 2019-05-30 DIAGNOSIS — I513 Intracardiac thrombosis, not elsewhere classified: Secondary | ICD-10-CM | POA: Diagnosis not present

## 2019-05-30 LAB — POCT INR: INR: 2.7 (ref 2.0–3.0)

## 2019-05-30 MED ORDER — WARFARIN SODIUM 5 MG PO TABS: ORAL_TABLET | ORAL | 0 refills | Status: DC

## 2019-05-30 MED FILL — WARFARIN SODIUM 5 MG TABLET: 5 | 30 days supply | Qty: 50 | Fill #0

## 2019-05-30 NOTE — Patient Instructions (Addendum)
Description   Continue same dosage 2 tablets daily except 1 tablet on Sundays, Tuesdays, and Thursdays.  Recheck INR in 4.5 weeks- per pt request.  Main # [phone] or call coumadin clinic [phone]

## 2019-05-30 NOTE — Progress Notes (Signed)
Pt requesting to get INR checked in 4.5 weeks. Pt aware of risks of not getting INR checked sooner

## 2019-06-06 MED FILL — ATORVASTATIN 40 MG TABLET: 40 | 90 days supply | Qty: 90 | Fill #0

## 2019-06-14 MED FILL — OZEMPIC 0.25 OR 0.5 MG/DOSE: 2 | 56 days supply | Qty: 2 | Fill #0

## 2019-07-01 ENCOUNTER — Other Ambulatory Visit: Payer: Self-pay

## 2019-07-01 ENCOUNTER — Ambulatory Visit (INDEPENDENT_AMBULATORY_CARE_PROVIDER_SITE_OTHER): Payer: BLUE CROSS/BLUE SHIELD | Admitting: *Deleted

## 2019-07-01 DIAGNOSIS — Z7901 Long term (current) use of anticoagulants: Secondary | ICD-10-CM | POA: Diagnosis not present

## 2019-07-01 DIAGNOSIS — I513 Intracardiac thrombosis, not elsewhere classified: Secondary | ICD-10-CM | POA: Diagnosis not present

## 2019-07-01 LAB — POCT INR: INR: 2.2 (ref 2.0–3.0)

## 2019-07-01 MED ORDER — WARFARIN SODIUM 5 MG PO TABS: ORAL_TABLET | ORAL | 1 refills | Status: DC

## 2019-07-01 MED FILL — WARFARIN SODIUM 5 MG TABLET: 5 | 30 days supply | Qty: 50 | Fill #0

## 2019-07-01 NOTE — Patient Instructions (Signed)
Description   Continue same dosage 2 tablets daily except 1 tablet on Sundays, Tuesdays, and Thursdays.  Recheck INR in 5 weeks.  Main # [phone] or call coumadin clinic [phone]

## 2019-07-08 ENCOUNTER — Other Ambulatory Visit: Payer: Self-pay | Admitting: Cardiovascular Disease

## 2019-07-11 NOTE — Progress Notes (Signed)
CARDIOLOGY OFFICE NOTE  Date:  07/16/2019    [name] Date of Birth: Jun 13, 1961 Medical Record #[id number]  PCP:  Loyal Jacobson, MD  Cardiologist:  Eden Emms  No chief complaint on file.   History of Present Illness: 58 y.o. seen initially in ER June 2017 with subacute anterior MI complicated by distal embolization to right leg. Had a complicated RLE embolectomy requiring fasciotomy by Dr Imogene Burn with long healing process including wound Vac. Had DES to distal RCA and mid LAD with EF 30-35% large apical thrombus. Diagnosed with DM Has refused a lot of meds and care.   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 Diabetes (HCC)   . LV (left ventricular) mural thrombus   . NSTEMI (non-ST elevated myocardial infarction) [facility])     Past Surgical History:  Procedure Laterality Date  . APPLICATION OF WOUND VAC Right 02/22/2016   Procedure: APPLICATION OF Negative Pressure Dressing Right Lower Leg;  Surgeon: Fransisco Hertz, MD;  Location: [facility] OR;  Service: Vascular;  Laterality: Right;  . APPLICATION OF WOUND VAC Right 02/26/2016   Procedure: APPLICATION OF WOUND VAC, RIGHT MEDIAL LOWER LEG FASCIOTOMY SITE;  Surgeon: Larina Earthly, MD;  Location: [facility] OR;  Service: Vascular;  Laterality: Right;  . CARDIAC CATHETERIZATION N/A 03/09/2016   Procedure: Coronary Stent Intervention;  Surgeon: Iran Ouch, MD;  Location: [facility] INVASIVE CV LAB;  Service: Cardiovascular;  Laterality: N/A;  distal  RCA mid LAD  . CARDIAC CATHETERIZATION N/A 03/09/2016   Procedure: Intravascular Pressure Wire/FFR Study;  Surgeon: Iran Ouch, MD;  Location: [facility] INVASIVE CV LAB;  Service: Cardiovascular;  Laterality: N/A;  mid LAD  . CARDIAC CATHETERIZATION N/A 03/09/2016   Procedure: Left Heart Cath and Coronary Angiography;  Surgeon: Iran Ouch, MD;  Location: [facility] INVASIVE CV LAB;  Service: Cardiovascular;  Laterality: N/A;  . EMBOLECTOMY Right 02/21/2016   Procedure: EMBOLECTOMY RIGHT COMMON FEMORAL ARTERY; FOUR COMPARTMENT FASCIOTOMY;  Surgeon: Fransisco Hertz, MD;  Location: [facility] OR;  Service: Vascular;  Laterality: Right;  . FASCIOTOMY CLOSURE Right 02/26/2016   Procedure: RIGHT LATERAL LOWER LEG FASCIOTOMY CLOSURE;  Surgeon: Larina Earthly, MD;  Location: [facility] OR;  Service: Vascular;  Laterality: Right;  . HEMATOMA EVACUATION Right 02/22/2016   Procedure: EVACUATION HEMATOMA With Placement of Negative Pressure Dressing;  Surgeon: Fransisco Hertz, MD;  Location: [facility] OR;  Service: Vascular;  Laterality: Right;     Medications: Current Outpatient Medications  Medication Sig Dispense Refill  . atorvastatin (LIPITOR) 20 MG tablet TAKE 1 TABLET BY MOUTH ONCE DAILY 90 tablet 1  . carvedilol (COREG) 6.25 MG tablet TAKE 1 TABLET BY MOUTH TWICE DAILY WITH MEALS. PLEASE KEEP UPCOMING APPT FOR FUTURE REFILLS 180 tablet 1  . OZEMPIC, 0.25 OR 0.5 MG/DOSE, 2 MG/1.5ML SOPN Inject into the skin.    Marland Kitchen warfarin (COUMADIN) 5 MG tablet TAKE  1 TO 2 TABLETS DAILY AS DIRECTED BY COUMADIN CLINIC 50 tablet 0   No current facility-administered medications for this visit.     Allergies: Allergies  Allergen Reactions  . Metformin     Other reaction(s): Dizziness (intolerance)    Social History: The patient  reports that he has never smoked. He has never used smokeless tobacco. He reports that he does not drink alcohol or use drugs.   Family History: The patient's family history includes Heart Problems in his father and  mother.   Review of Systems: Please see the history of present illness.   Otherwise, the review of systems is positive for none.   All other systems are reviewed and negative.   Physical Exam: VS:  BP 136/72   Pulse 78   Ht 5\' 8"  (1.727 m)   Wt 212 lb (96.2 kg)   SpO2 99%   BMI 32.23 kg/m  .  BMI Body mass index is 32.23 kg/m.  Wt Readings from Last 3 Encounters:  07/16/19 212 lb (96.2 kg)  07/12/18 209 lb 8 oz (95 kg)  02/13/18 201 lb (91.2 kg)   Affect appropriate Healthy:  appears stated age HEENT: normal Neck supple with no adenopathy JVP normal no bruits no thyromegaly Lungs clear with no wheezing and good diaphragmatic motion Heart:  S1/S2 no murmur, no rub, gallop or click PMI enlarged  Abdomen: benighn, BS positve, no tenderness, no AAA no bruit.  No HSM or HJR Distal pulses intact with no bruits No edema Neuro non-focal Skin warm and dry No muscular weakness Fasciotomy scars RLE with some foot drop      LABORATORY DATA:  EKG:   .07/16/19  SR rate 78 old anterior MI   Lab Results  Component Value Date   WBC 7.1 04/04/2016   HGB 11.6 (L) 04/04/2016   HCT 36.3 (L) 04/04/2016   PLT 402 (H) 04/04/2016   GLUCOSE 264 (H) 04/04/2016   CHOL 116 02/23/2016   TRIG 130 02/23/2016   HDL 30 (L) 02/23/2016   LDLCALC 60 02/23/2016   ALT 23 04/04/2016   AST 20 04/04/2016   NA 136 04/04/2016   K 4.3 04/04/2016   CL 101 04/04/2016   CREATININE 0.90 04/04/2016   BUN 13 04/04/2016   CO2 24 04/04/2016   TSH 3.200 03/03/2016   INR 2.2 07/01/2019   HGBA1C 5.9 (H) 06/01/2016    BNP (last 3 results) No results for input(s): BNP in the last 8760 hours.  ProBNP (last 3 results) No results for input(s): PROBNP in the last 8760 hours.   Other Studies Reviewed Today:  Cardiac Cath Conclusion from 02/2016    Prox RCA lesion, 10% stenosed.  Dist LAD lesion, 60% stenosed.  Dist RCA lesion, 90% stenosed. Post intervention, there is a 0% residual  stenosis.  Mid LAD lesion, 60% stenosed. Post intervention, there is a 0% residual stenosis.  1. Significant 2 vessel coronary artery disease involving the distal right coronary artery and mid LAD. The culprit for recent myocardial infarction seems to be the mid LAD stenosis which appears to be thrombotic and hazy in spite of being only 60% in severity. However, this was significant by FFR.  2. Moderately elevated left ventricular end-diastolic pressure. LV angiography was not performed due to LV thrombus.  3. Successful angioplasty and drug-eluting stent placement to the distal right coronary artery and mid left anterior descending artery.   Recommendations: Recommend treatment with aspirin, Plavix and warfarin for one month. After one  month, aspirin can be discontinued. Heparin can be resumed today 8 hours after sheath pull. Warfarin can be started. I switched from propranolol to carvedilol. Continue treatment for cardiomyopathy and add an ACE inhibitor or ARB before hospital discharge. There was residual 60% distal LAD stenosis which was left to be treated medically.   Echo Study Conclusions from 12/19/17   Study Conclusions  - Left ventricle: The cavity size was normal. Systolic function was   moderately reduced. The estimated ejection fraction was in the   range of 35% to 40%. Doppler parameters are consistent with   abnormal left ventricular relaxation (grade 1 diastolic   dysfunction). Mild to moderate concentric and moderate focal   basal septal hypertrophy. - Regional wall motion abnormality: Akinesis of the mid   anteroseptal and apical myocardium; severe hypokinesis of the   apical anterior, apical inferior, and apical septal myocardium;   moderate hypokinesis of the apical lateral myocardium. - Aortic valve: There was mild regurgitation. - Aorta: Mild aortic root dilatation. .  Assessment/Plan: 1. Anterior MI - July 2017  with associated LV dysfunction. Cardiac  catheterization with DES to distal RCA and Mid LAD. Residual CAD noted. Would plan to treat medically.  81 mg ASA  2. Ischemic CM - with EF of 30 to 35%. This improved to 35-40% by TTE done 12/19/17 no residual apical thrombus Not compliant With meds and not taking ARB/ACE    3. LV (left ventricular) mural thrombus ([place]) with embolization of right femoral artery resulting in limb ischemia s/p thrombectomy and fasciotomy with post operative course complicated by hematoma and bleeding s/p evacuation of hematoma with application of a VAC in the setting of therapeutic anti-coagulation. Seen by Dr. Donnetta Hutching. Finally healed  TTE 12/19/17 thrombus resolved but maintained on coumadin per [facility]  4. Diabetes mellitus/Hyperglycemia -Hemoglobin A1c 9.2. f/u with primary currently not taking any meds  -supposed to be on Januvia   6. PVD:   Still with foot drop. F/u with [facility] Dr Early   7. Cholesterol:  At goal he insists on cutting lipitor back to 20 mg f/u labs 3 months   Lab Results  Component Value Date   LDLCALC 60 02/23/2016       Jenkins Rouge

## 2019-07-16 ENCOUNTER — Encounter: Payer: Self-pay | Admitting: Cardiovascular Disease

## 2019-07-16 ENCOUNTER — Ambulatory Visit (INDEPENDENT_AMBULATORY_CARE_PROVIDER_SITE_OTHER): Payer: BLUE CROSS/BLUE SHIELD | Admitting: Cardiovascular Disease

## 2019-07-16 ENCOUNTER — Other Ambulatory Visit: Payer: Self-pay

## 2019-07-16 VITALS — BP 136/72 | HR 78 | Ht 68.0 in | Wt 212.0 lb

## 2019-07-16 DIAGNOSIS — I2510 Atherosclerotic heart disease of native coronary artery without angina pectoris: Secondary | ICD-10-CM | POA: Diagnosis not present

## 2019-07-16 NOTE — Patient Instructions (Addendum)

## 2019-07-17 MED FILL — OZEMPIC 0.25 OR 0.5 MG/DOSE: 2 | 28 days supply | Qty: 2 | Fill #0

## 2019-08-05 ENCOUNTER — Ambulatory Visit (INDEPENDENT_AMBULATORY_CARE_PROVIDER_SITE_OTHER): Payer: BLUE CROSS/BLUE SHIELD | Admitting: *Deleted

## 2019-08-05 ENCOUNTER — Other Ambulatory Visit: Payer: Self-pay

## 2019-08-05 DIAGNOSIS — Z5181 Encounter for therapeutic drug level monitoring: Secondary | ICD-10-CM

## 2019-08-05 DIAGNOSIS — Z7901 Long term (current) use of anticoagulants: Secondary | ICD-10-CM | POA: Diagnosis not present

## 2019-08-05 DIAGNOSIS — I513 Intracardiac thrombosis, not elsewhere classified: Secondary | ICD-10-CM | POA: Diagnosis not present

## 2019-08-05 LAB — POCT INR: INR: 2.4 (ref 2.0–3.0)

## 2019-08-05 MED ORDER — WARFARIN SODIUM 5 MG PO TABS: ORAL_TABLET | ORAL | 2 refills | Status: DC

## 2019-08-05 NOTE — Patient Instructions (Signed)
Description   Continue same dosage 2 tablets daily except 1 tablet on Sundays, Tuesdays, and Thursdays.  Recheck INR in 6 weeks.  Main # [phone] or call coumadin clinic [phone]

## 2019-08-08 MED FILL — WARFARIN SODIUM 5 MG TABLET: 5 | 30 days supply | Qty: 50 | Fill #1

## 2019-08-15 MED FILL — OZEMPIC 0.25 OR 0.5 MG/DOSE: 2 | 28 days supply | Qty: 2 | Fill #1

## 2019-08-26 ENCOUNTER — Other Ambulatory Visit: Payer: Self-pay | Admitting: Cardiovascular Disease

## 2019-08-26 MED FILL — CARVEDILOL 6.25 MG TABLET: 6.25 | 90 days supply | Qty: 180 | Fill #0

## 2019-09-11 MED FILL — WARFARIN SODIUM 5 MG TABLET: 5 | 30 days supply | Qty: 50 | Fill #0

## 2019-09-13 MED FILL — OZEMPIC 0.25 OR 0.5 MG/DOSE: 2 | 30 days supply | Qty: 2 | Fill #0

## 2019-09-16 ENCOUNTER — Other Ambulatory Visit: Payer: Self-pay

## 2019-09-16 ENCOUNTER — Ambulatory Visit (INDEPENDENT_AMBULATORY_CARE_PROVIDER_SITE_OTHER): Payer: 59 | Admitting: Pharmacist

## 2019-09-16 DIAGNOSIS — I513 Intracardiac thrombosis, not elsewhere classified: Secondary | ICD-10-CM | POA: Diagnosis not present

## 2019-09-16 DIAGNOSIS — Z7901 Long term (current) use of anticoagulants: Secondary | ICD-10-CM | POA: Diagnosis not present

## 2019-09-16 LAB — POCT INR: INR: 2.4 (ref 2.0–3.0)

## 2019-09-16 NOTE — Patient Instructions (Signed)
Continue same dosage 2 tablets daily except 1 tablet on Sundays, Tuesdays, and Thursdays.  Recheck INR in 6 weeks.  Main # [phone] or call coumadin clinic [phone]

## 2019-10-14 MED FILL — WARFARIN SODIUM 5 MG TABLET: 5 | 30 days supply | Qty: 50 | Fill #1

## 2019-10-25 ENCOUNTER — Telehealth: Payer: Self-pay | Admitting: *Deleted

## 2019-10-25 MED FILL — AMOXICILLIN 500 MG CAPSULE: 500 | 7 days supply | Qty: 21 | Fill #0

## 2019-10-25 NOTE — Telephone Encounter (Signed)
Pt takes warfarin for apical thrombus after MI in June 2017, also with embolization of right femoral artery resulting in limb ischemia s/p thrombectomy and fasciotomy. Follow up TEE 12/19/17 showed no residual apical thrombus, continued on warfarin per [facility].  Most recent note from [facility] with Dr Early is from 02/13/18 - long-term anticoagulation recommended.  Will need clearance from Dr Arbie Cookey as pt is maintained on warfarin long term for non-cardiac indication.

## 2019-10-25 NOTE — Telephone Encounter (Signed)
   [facility] Pre-operative Risk Assessment    Request for surgical clearance:  1. What type of surgery is being performed? DENTAL CLEANING, X-RAYS, WAS TOLD UP TO 3 TEETH EXTRACTED, FILLINGS  2. When is this surgery scheduled? 10/30/19   3. What type of clearance is required (medical clearance vs. Pharmacy clearance to hold med vs. Both)? PHARM  4. Are there any medications that need to be held prior to surgery and how long? WARFARIN   5. Practice name and name of physician performing surgery? ERIC SADLER, DDS    6. What is your office phone number [phone]    7.   What is your office fax number [phone]  8.   Anesthesia type (None, local, MAC, general) ? LOCAL w/EPINEPHRINE   Julaine Hua 10/25/2019, 12:38 PM  _________________________________________________________________   (provider comments below)

## 2019-10-28 MED FILL — OZEMPIC 0.25 OR 0.5 MG/DOSE: 2 | 28 days supply | Qty: 2 | Fill #0

## 2019-11-06 ENCOUNTER — Other Ambulatory Visit: Payer: Self-pay | Admitting: Cardiology

## 2019-11-06 NOTE — Progress Notes (Signed)
I have called the [facility] and informed him that he can stop his warfarin per Dr. Arbie Cookey and Dr. Eden Emms. He understands to stop this and that he will no longer need to have his blood checked for the warfarin. I removed warfarin from his medication list.

## 2019-11-06 NOTE — Telephone Encounter (Signed)
Per staff message from Dr. Arbie Cookey: "This patient had lower extremity embolus presumed cardiac. No real indication from peripheral vascular to continue warfarin. Okay to stop if okay with cardiology"  I will forward this information to our pharmacist for input on holding anticoagulation, timing. And also back to our preop pool to consolidate recommendations.

## 2019-11-06 NOTE — Telephone Encounter (Signed)
Does not need to be on anymore from cardiac standpoint he would be happy to stop

## 2019-11-06 NOTE — Telephone Encounter (Signed)
Dr Eden Emms - what are your thoughts on pt discontinuing warfarin permanently? Per your last note, pt was to continue on warfarin per [facility], however Dr Arbie Cookey has weighed in stating warfarin would be ok to discontinue if ok with cardiology.

## 2019-11-06 NOTE — Telephone Encounter (Signed)
   Primary Cardiologist: Charlton Haws, MD  Chart reviewed as part of pre-operative protocol coverage. The request is for extraction of 3 teeth. When I called the patient he states that he is to have only 1 tooth extracted today. It is generally accepted that for simple extractions and dental cleanings, there is no need to interrupt blood thinner therapy.   We are fully discontinuing his warfarin therapy from here forward. He did take his warfarin yesterday. He will not need to resume the warfarn after his dental work. I discussed this with the patient and he understands  SBE prophylaxis is not required for the patient.  I will route this recommendation to the requesting party via Epic fax function and remove from pre-op pool.  Please call with questions.  Berton Bon, NP 11/06/2019, 10:10 AM

## 2019-11-07 MED FILL — ATORVASTATIN 40 MG TABLET: 40 | 90 days supply | Qty: 90 | Fill #0

## 2019-12-19 MED FILL — OZEMPIC 0.25 OR 0.5 MG/DOSE: 2 | 28 days supply | Qty: 2 | Fill #0

## 2019-12-19 MED FILL — CARVEDILOL 6.25 MG TABLET: 6.25 | 90 days supply | Qty: 180 | Fill #1

## 2020-02-14 ENCOUNTER — Telehealth: Payer: Self-pay

## 2020-02-14 ENCOUNTER — Telehealth: Payer: Self-pay | Admitting: Cardiovascular Disease

## 2020-02-14 MED FILL — ATORVASTATIN 20 MG TABLET: 20 | 90 days supply | Qty: 90 | Fill #0

## 2020-02-14 NOTE — Telephone Encounter (Signed)
I already called [facility] pharmacy and we are only refilling what we have always prescribed, Lipitor 20 mg by mouth daily.

## 2020-02-14 NOTE — Telephone Encounter (Signed)
    Pt c/o medication issue:  1. Name of Medication: atorvastatin (LIPITOR) 20 MG tablet  2. How are you currently taking this medication (dosage and times per day)? TAKE 1 TABLET BY MOUTH ONCE DAILY  3. Are you having a reaction (difficulty breathing--STAT)?   4. What is your medication issue? Monica from [place] pharmacy called to refill this medication however she said dosage was changed on 05/2019 to 40 mg. Need new prescription sent to [facility] outpatient pharmacy with the correct dose

## 2020-02-14 NOTE — Telephone Encounter (Signed)
Called pharmacy back. Informed them that patient has always been on Lipitor 20 mg daily. They will refill Lipitor 20 mg daily.

## 2020-02-14 NOTE — Telephone Encounter (Signed)
[facility] outpatient pharmacy is requesting a refill on Atorvastatin 40 mg tablet. I called the pharmacy and the pharmacy tech stated that pt's PCP Dr. Leonette Most is not following the pt for this medication anymore and will not refill medication. Would Dr. Eden Emms like to refill this medicaiton? Please address

## 2020-04-24 ENCOUNTER — Encounter (HOSPITAL_COMMUNITY): Payer: Self-pay

## 2020-04-24 ENCOUNTER — Ambulatory Visit (HOSPITAL_COMMUNITY)
Admission: EM | Admit: 2020-04-24 | Discharge: 2020-04-24 | Disposition: A | Discharge status: Home / Self Care | Payer: 59

## 2020-04-24 ENCOUNTER — Other Ambulatory Visit: Payer: Self-pay

## 2020-04-24 DIAGNOSIS — Z760 Encounter for issue of repeat prescription: Secondary | ICD-10-CM

## 2020-04-24 DIAGNOSIS — M79675 Pain in left toe(s): Secondary | ICD-10-CM

## 2020-04-24 MED ORDER — OZEMPIC (0.25 OR 0.5 MG/DOSE) 2 MG/1.5ML ~~LOC~~ SOPN: 0.5000 mg | PEN_INJECTOR | SUBCUTANEOUS | 0 refills | Status: DC

## 2020-04-24 MED ORDER — DICLOFENAC SODIUM 1 % EX GEL: 2.0000 g | Freq: Four times a day (QID) | CUTANEOUS | 0 refills | Status: DC

## 2020-04-24 MED FILL — OZEMPIC 0.25 OR 0.5 MG/DOSE: 2 | 28 days supply | Qty: 2 | Fill #0

## 2020-04-24 MED FILL — DICLOFENAC SODIUM 1 % GEL: 1 | 50 days supply | Qty: 400 | Fill #0

## 2020-04-24 NOTE — Discharge Instructions (Signed)
You may use the gel I have sent to the pharmacy to your toe to see if this is helpful.  Please follow up with your primary care provider for long term management of your medications.

## 2020-04-24 NOTE — ED Provider Notes (Signed)
[facility]    CSN: [id number] Arrival date & time: 04/24/20  1257      History   Chief Complaint Chief Complaint  Patient presents with  . Medication Refill    HPI Troy Yu is a 59 y.o. male.   Troy Yu presents with requests for refill of ozempic, he took his last dose 8/22. Takes it once a week on Sunday. States his blood sugar has been in the 160's after eating. His insurance changed so he has had to change PCP's. He has an appointment in October with his new PCP he states.   Also states that since last night he has had pain to his left foot second toe, sharp shooting pain, every 30 minutes or so. No injury. No redness swelling or warmth to the toe. Denies any previous similar. Hasn't taken any medications for the pain.   ROS per HPI, negative if not otherwise mentioned.      Past Medical History:  Diagnosis Date  . Diabetes (HCC)   . LV (left ventricular) mural thrombus   . NSTEMI (non-ST elevated myocardial infarction) [facility])     Patient Active Problem List   Diagnosis Date Noted  . History of fasciotomy 06/07/2016  . Status post coronary artery stent placement   . Acute combined systolic and diastolic heart failure (HCC)   . Abnormal CXR   . Abdominal pain   . Chest pain   . NSTEMI (non-ST elevated myocardial infarction) (HCC)   . Fever   . Thromboembolism (HCC)   . Vascular occlusion   . Pain   . Anterior subendocardial MI (HCC) 02/21/2016  . Right leg pain 02/21/2016  . Non-STEMI (non-ST elevated myocardial infarction) (HCC) 02/21/2016  . LV (left ventricular) mural thrombus 02/21/2016  . Elevated troponin 02/21/2016  . Abnormal EKG 02/21/2016  . Acute MI (HCC) 02/21/2016  . History of embolectomy 02/21/2016  . Diabetes [facility])     Past Surgical History:  Procedure Laterality Date  . APPLICATION OF WOUND VAC Right 02/22/2016   Procedure: APPLICATION OF Negative Pressure Dressing Right Lower Leg;  Surgeon: Fransisco Hertz,  MD;  Location: [facility] OR;  Service: Vascular;  Laterality: Right;  . APPLICATION OF WOUND VAC Right 02/26/2016   Procedure: APPLICATION OF WOUND VAC, RIGHT MEDIAL LOWER LEG FASCIOTOMY SITE;  Surgeon: Larina Earthly, MD;  Location: [facility] OR;  Service: Vascular;  Laterality: Right;  . CARDIAC CATHETERIZATION N/A 03/09/2016   Procedure: Coronary Stent Intervention;  Surgeon: Iran Ouch, MD;  Location: [facility] INVAS[facility] CV LAB;  Service: Cardiovascular;  Laterality: N/A;  distal RCA mid LAD  . CARDIAC CATHETERIZATION N/A 03/09/2016   Procedure: Intravascular Pressure Wire/FFR Study;  Surgeon: Iran Ouch, MD;  Location: [facility] INVASIVE CV LAB;  Service: Cardiovascular;  Laterality: N/A;  mid LAD  . CARDIAC CATHETERIZATION N/A 03/09/2016   Procedure: Left Heart Cath and Coronary Angiography;  Surgeon: Iran Ouch, MD;  Location: [facility] INVASIVE CV LAB;  Service: Cardiovascular;  Laterality: N/A;  . EMBOLECTOMY Right 02/21/2016   Procedure: EMBOLECTOMY RIGHT COMMON FEMORAL ARTERY; FOUR COMPARTMENT FASCIOTOMY;  Surgeon: Fransisco Hertz, MD;  Location: [facility] OR;  Service: Vascular;  Laterality: Right;  . FASCIOTOMY CLOSURE Right 02/26/2016   Procedure: RIGHT LATERAL LOWER LEG FASCIOTOMY CLOSURE;  Surgeon: Larina Earthly, MD;  Location: [facility] OR;  Service: Vascular;  Laterality: Right;  . HEMATOMA EVACUATION Right 02/22/2016   Procedure: EVACUATION HEMATOMA With Placement of Negative Pressure Dressing;  Surgeon: Fransisco Hertz,  MD;  Location: [facility] OR;  Service: Vascular;  Laterality: Right;       Home Medications    Prior to Admission medications   Medication Sig Start Date End Date Taking? Authorizing Provider  atorvastatin (LIPITOR) 40 MG tablet Take by mouth. 11/04/19  Yes [provider]  glucose blood (ONETOUCH VERIO) test strip 1 strip by In Vitro route 2 times daily. Dx: E11.9 01/23/18  Yes [provider]  atorvastatin (LIPITOR) 20 MG tablet TAKE 1 TABLET BY MOUTH ONCE DAILY 01/07/19   Wendall Stade, MD   atorvastatin (LIPITOR) 40 MG tablet Take 40 mg by mouth at bedtime. 11/04/19   [provider]  carvedilol (COREG) 6.25 MG tablet TAKE 1 TABLET BY MOUTH TWICE DAILY WITH MEALS. PLEASE KEEP UPCOMING APPT FOR FUTURE REFILLS 08/26/19   Wendall Stade, MD  diclofenac Sodium (VOLTAREN) 1 % GEL Apply 2 g topically 4 (four) times daily. 04/24/20   Sivan Cuello, Dorene Grebe B, NP  OZEMPIC, 0.25 OR 0.5 MG/DOSE, 2 MG/1.5ML SOPN Inject into the skin.    [provider]  Semaglutide,0.25 or 0.5MG /DOS, (OZEMPIC, 0.25 OR 0.5 MG/DOSE,) 2 MG/1.5ML SOPN Inject 0.375 mLs (0.5 mg total) into the skin once a week. 04/24/20   Georgetta Haber, NP    Family History Family History  Problem Relation Age of Onset  . Heart Problems Father   . Heart Problems Mother     Social History Social History   Tobacco Use  . Smoking status: Never Smoker  . Smokeless tobacco: Never Used  Vaping Use  . Vaping Use: Never used  Substance Use Topics  . Alcohol use: No  . Drug use: No     Allergies   Metformin   Review of Systems Review of Systems   Physical Exam Triage Vital Signs ED Triage Vitals  Enc Vitals Group     BP 04/24/20 1401 121/74     Pulse Rate 04/24/20 1401 84     Resp 04/24/20 1401 18     Temp 04/24/20 1401 98.6 F (37 C)     Temp Source 04/24/20 1401 Oral     SpO2 04/24/20 1401 97 %     Weight --      Height --      Head Circumference --      Peak Flow --      Pain Score 04/24/20 1400 0     Pain Loc --      Pain Edu? --      Excl. in GC? --    No data found.  Updated Vital Signs BP 121/74 (BP Location: Right Arm)   Pulse 84   Temp 98.6 F (37 C) (Oral)   Resp 18   SpO2 97%   Visual Acuity Right Eye Distance:   Left Eye Distance:   Bilateral Distance:    Right Eye Near:   Left Eye Near:    Bilateral Near:     Physical Exam Constitutional:      Appearance: He is well-developed.  Cardiovascular:     Rate and Rhythm: Normal rate.  Pulmonary:     Effort:  Pulmonary effort is normal.  Musculoskeletal:     Left foot: Normal range of motion. No swelling, deformity, laceration, tenderness or bony tenderness.     Comments: Indicates pain to left foot second toe without any redness, swelling or warmth; cap refill < 2 seconds    Skin:    General: Skin is warm and dry.  Neurological:  Mental Status: He is alert and oriented to person, place, and time.      UC Treatments / Results  Labs (all labs ordered are listed, but only abnormal results are displayed) Labs Reviewed - No data to display  EKG   Radiology No results found.  Procedures Procedures (including critical care time)  Medications Ordered in UC Medications - No data to display  Initial Impression / Assessment and Plan / UC Course  I have reviewed the triage vital signs and the nursing notes.  Pertinent labs & imaging results that were available during my care of the patient were reviewed by me and considered in my medical decision making (see chart for details).     Discussed that through urgent care we can provide short interim refills, emphasized follow up with PCP for long term management of medications. Topical voltaren for toe prn, neuropathy considered as well. Patient verbalized understanding and agreeable to plan.   Final Clinical Impressions(s) / UC Diagnoses   Final diagnoses:  Encounter for medication refill  Pain of toe of left foot     Discharge Instructions     You may use the gel I have sent to the pharmacy to your toe to see if this is helpful.  Please follow up with your primary care provider for long term management of your medications.    ED Prescriptions    Medication Sig Dispense Auth. Provider   Semaglutide,0.25 or 0.5MG /DOS, (OZEMPIC, 0.25 OR 0.5 MG/DOSE,) 2 MG/1.5ML SOPN Inject 0.375 mLs (0.5 mg total) into the skin once a week. 3 mL Linus Mako B, NP   diclofenac Sodium (VOLTAREN) 1 % GEL Apply 2 g topically 4 (four) times daily.  350 g Georgetta Haber, NP     PDMP not reviewed this encounter.   Georgetta Haber, NP 04/24/20 1536

## 2020-04-24 NOTE — ED Triage Notes (Signed)
Pt is here wanting a refill of Ozempic pt states his appt with his PCP in October. Pt states he ran out last Sunday, he takes 1 pill every Sunday.

## 2020-05-20 MED FILL — ATORVASTATIN CALCIUM 20 MG: 20 | 90 days supply | Qty: 90 | Fill #1

## 2020-05-20 MED FILL — CARVEDILOL 6.25 MG TABLET: 6.25 | 90 days supply | Qty: 180 | Fill #2

## 2020-06-16 ENCOUNTER — Encounter (HOSPITAL_COMMUNITY): Payer: Self-pay | Admitting: *Deleted

## 2020-06-16 ENCOUNTER — Other Ambulatory Visit (HOSPITAL_COMMUNITY): Payer: Self-pay | Admitting: Family Medicine

## 2020-06-16 ENCOUNTER — Other Ambulatory Visit: Payer: Self-pay

## 2020-06-16 ENCOUNTER — Ambulatory Visit (HOSPITAL_COMMUNITY)
Admission: EM | Admit: 2020-06-16 | Discharge: 2020-06-16 | Disposition: A | Discharge status: Home / Self Care | Payer: 59 | Attending: Family Medicine | Admitting: Family Medicine

## 2020-06-16 DIAGNOSIS — K0889 Other specified disorders of teeth and supporting structures: Secondary | ICD-10-CM

## 2020-06-16 DIAGNOSIS — E119 Type 2 diabetes mellitus without complications: Secondary | ICD-10-CM

## 2020-06-16 HISTORY — DX: Pure hypercholesterolemia, unspecified: E78.00

## 2020-06-16 MED ORDER — ONETOUCH VERIO VI STRP: ORAL_STRIP | 0 refills | Status: DC

## 2020-06-16 MED ORDER — OZEMPIC (0.25 OR 0.5 MG/DOSE) 2 MG/1.5ML ~~LOC~~ SOPN: 0.5000 mg | PEN_INJECTOR | SUBCUTANEOUS | 0 refills | Status: DC

## 2020-06-16 MED ORDER — HYDROCODONE-ACETAMINOPHEN 5-325 MG PO TABS
1.0000 | ORAL_TABLET | Freq: Four times a day (QID) | ORAL | 0 refills | Status: DC | PRN
Start: 2020-06-16 — End: 2020-07-07

## 2020-06-16 MED FILL — TRUE METRIX TEST STRIP: 50 days supply | Qty: 100 | Fill #0

## 2020-06-16 MED FILL — HYDROCODON-APAP 5-325: 5-325 | 2 days supply | Qty: 8 | Fill #0

## 2020-06-16 MED FILL — OZEMPIC 0.25 OR 0.5 MG/DOSE: 2 | 28 days supply | Qty: 2 | Fill #0

## 2020-06-16 NOTE — Discharge Instructions (Signed)
Continue taking ibuprofen.  Be aware, you have been prescribed pain medications that may cause drowsiness. While taking this medication, do not take any other medications containing acetaminophen (Tylenol). Do not combine with alcohol or other illicit drugs. Please do not drive, operate heavy machinery, or take part in activities that require making important decisions while on this medication as your judgement may be clouded.  

## 2020-06-16 NOTE — ED Triage Notes (Signed)
Pt reports seeing dentist 2 days ago for right upper toothache - started on PCN and IBU; states pain is not relieved enough by IBU.  Denies fevers.  Also requesting Rx refill for Ozempic; states f/u appt with PCP isn't until 07/06/20.

## 2020-06-16 NOTE — ED Provider Notes (Signed)
[facility]   [id number] 06/16/20 Arrival Time: 1531  ASSESSMENT & PLAN:  1. Pain, dental   2. Type 2 diabetes mellitus without complication, without long-term current use of insulin (HCC)     No sign of abscess requiring I&D at this time. Discussed.  Meds ordered this encounter  Medications  . Semaglutide,0.25 or 0.5MG /DOS, (OZEMPIC, 0.25 OR 0.5 MG/DOSE,) 2 MG/1.5ML SOPN    Sig: Inject 0.5 mg into the skin once a week.    Dispense:  3 mL    Refill:  0  . glucose blood (ONETOUCH VERIO) test strip    Sig: 1 strip by In Vitro route 2 times daily. Dx: E11.9    Dispense:  100 each    Refill:  0  . HYDROcodone-acetaminophen (NORCO/VICODIN) 5-325 MG tablet    Sig: Take 1 tablet by mouth every 6 (six) hours as needed for moderate pain or severe pain.    Dispense:  8 tablet    Refill:  0    [place] Controlled Substances Registry consulted for this patient. I feel the risk/benefit ratio today is favorable for proceeding with this prescription for a controlled substance. Medication sedation precautions given.   Reviewed expectations re: course of current medical issues. Questions answered. Outlined signs and symptoms indicating need for more acute intervention. Patient verbalized understanding. After Visit Summary given.   SUBJECTIVE:  Troy Yu is a 59 y.o. male who reports seeing his dentist for R upper rear toothache; Rx ibuprofen and PCN; taking. Still with tooth pain that affects daily activities. Afebrile. Tolerating PO intake without n/v. Also requests refill of DM medication. Out for past few days. Has not checked BS.  OBJECTIVE: Vitals:   06/16/20 1625  BP: 137/80  Pulse: 75  Resp: 16  Temp: 98.1 F (36.7 C)  TempSrc: Oral  SpO2: 98%    General appearance: alert; no distress HENT: normocephalic; atraumatic; dentition: good; right upper rear gums without swelling, drainage, or bleeding and with tenderness; normal jaw movement without  difficulty Neck: supple without LAD; FROM; trachea midline Lungs: normal respirations; unlabored; speaks full sentences without difficulty Skin: warm and dry Psychological: alert and cooperative; normal mood and affect  Allergies  Allergen Reactions  . Metformin     Other reaction(s): Dizziness (intolerance)    Past Medical History:  Diagnosis Date  . Diabetes (HCC)   . Hypercholesteremia   . LV (left ventricular) mural thrombus   . NSTEMI (non-ST elevated myocardial infarction) [facility])    Social History   Socioeconomic History  . Marital status: Married    Spouse name: [name]  . Number of children: 5  . Years of education: COLLEGE  . Highest education level: Not on file  Occupational History  . Occupation: UNEMPLOYED  Tobacco Use  . Smoking status: Never Smoker  . Smokeless tobacco: Never Used  Vaping Use  . Vaping Use: Never used  Substance and Sexual Activity  . Alcohol use: No  . Drug use: No  . Sexual activity: Not on file  Other Topics Concern  . Not on file  Social History Narrative  . Not on file   Social Determinants of Health   Financial Resource Strain:   . Difficulty of Paying Living Expenses: Not on file  Food Insecurity:   . Worried About Programme researcher, broadcasting/film/video in the Last Year: Not on file  . Ran Out of Food in the Last Year: Not on file  Transportation Needs:   . Lack of Transportation (Medical): Not  on file  . Lack of Transportation (Non-Medical): Not on file  Physical Activity:   . Days of Exercise per Week: Not on file  . Minutes of Exercise per Session: Not on file  Stress:   . Feeling of Stress : Not on file  Social Connections:   . Frequency of Communication with Friends and Family: Not on file  . Frequency of Social Gatherings with Friends and Family: Not on file  . Attends Religious Services: Not on file  . Active Member of Clubs or Organizations: Not on file  . Attends Banker Meetings: Not on file  . Marital Status: Not  on file  Intimate Partner Violence:   . Fear of Current or Ex-Partner: Not on file  . Emotionally Abused: Not on file  . Physically Abused: Not on file  . Sexually Abused: Not on file   Family History  Problem Relation Age of Onset  . Heart Problems Father   . Heart Problems Mother    Past Surgical History:  Procedure Laterality Date  . APPLICATION OF WOUND VAC Right 02/22/2016   Procedure: APPLICATION OF Negative Pressure Dressing Right Lower Leg;  Surgeon: Fransisco Hertz, MD;  Location[facility] OR;  Service: Vascular;  Laterality: Right;  . APPLICATION OF WOUND VAC Right 02/26/2016   Procedure: APPLICATION OF WOUND VAC, RIGHT MEDIAL LOWER LEG FASCIOTOMY SITE;  Surgeon: Larina Earthly, MD;  Location: [facility] OR;  Service: Vascular;  Laterality: Right;  . CARDIAC CATHETERIZATION N/A 03/09/2016   Procedure: Coronary Stent Intervention;  Surgeon: Iran Ouch, MD;  [facility]: [facility] LAB;  Service: Cardiovascular;  Laterality: N/A;  distal RCA mid LAD  . CARDIAC CATHETERIZATION N/A 03/09/2016   Procedure: Intravascular Pressure Wire/FFR Study;  Surgeon: Iran Ouch, MD;  Location: [facility] INVASIVE CV LAB;  Service: Cardiovascular;  Laterality: N/A;  mid LAD  . CARDIAC CATHETERIZATION N/A 03/09/2016   Procedure: Left Heart Cath and Coronary Angiography;  Surgeon: Iran Ouch, MD;  [facility]: [facility];  Service: Cardiovascular;  Laterality: N/A;  . EMBOLECTOMY Right 02/21/2016   Procedure: EMBOLECTOMY RIGHT COMMON FEMORAL ARTERY; FOUR COMPARTMENT FASCIOTOMY;  Surgeon: Fransisco Hertz, MD;  Location: [facility] OR;  Service: Vascular;  Laterality: Right;  . FASCIOTOMY CLOSURE Right 02/26/2016   Procedure: RIGHT LATERAL LOWER LEG FASCIOTOMY CLOSURE;  Surgeon: Larina Earthly, MD;  Location: [facility] OR;  Service: Vascular;  Laterality: Right;  . HEMATOMA EVACUATION Right 02/22/2016   Procedure: EVACUATION HEMATOMA With Placement of Negative Pressure Dressing;  Surgeon: Fransisco Hertz, MD;  Location: [facility] OR;   Service: Vascular;  Laterality: Right;     Mardella Layman, MD 06/16/20 [id number]

## 2020-07-06 ENCOUNTER — Encounter: Payer: Self-pay | Admitting: Family Medicine

## 2020-07-06 ENCOUNTER — Ambulatory Visit (INDEPENDENT_AMBULATORY_CARE_PROVIDER_SITE_OTHER): Payer: 59 | Admitting: Family Medicine

## 2020-07-06 ENCOUNTER — Other Ambulatory Visit: Payer: Self-pay

## 2020-07-06 VITALS — BP 122/81 | HR 83 | Temp 98.5°F | Ht 68.0 in | Wt 207.0 lb

## 2020-07-06 DIAGNOSIS — E1165 Type 2 diabetes mellitus with hyperglycemia: Secondary | ICD-10-CM

## 2020-07-06 DIAGNOSIS — I5041 Acute combined systolic (congestive) and diastolic (congestive) heart failure: Secondary | ICD-10-CM | POA: Diagnosis not present

## 2020-07-06 DIAGNOSIS — I214 Non-ST elevation (NSTEMI) myocardial infarction: Secondary | ICD-10-CM | POA: Diagnosis not present

## 2020-07-06 MED ORDER — OZEMPIC (0.25 OR 0.5 MG/DOSE) 2 MG/1.5ML ~~LOC~~ SOPN: 0.2500 mg | PEN_INJECTOR | SUBCUTANEOUS | 3 refills | Status: DC

## 2020-07-06 NOTE — Progress Notes (Signed)
Subjective:  Patient ID: Troy Yu, male    DOB: [birth year]  Age: 59 y.o. MRN: [id number]  CC:  Chief Complaint  Patient presents with  . Establish Care    Pt reports he he feels good today no complaints.    HPI [name] presents for  New patient to establish care.  Previous PCP Dr. Jeralene Huff. Insurance change.   Cardiac Ischemic cardiomyopathy, history of MI, subacute anterior MI complicated by distal embolization to right leg in June 2017.  Required right lower extremity embolectomy requiring fasciotomy, DES to distal RCA and mid LAD with EF 30 to 35%. Cardiologist: Dr. Johnsie Cancel.  Last visit in November 2020.   Continued on 81 mg aspirin.  Medical treatment for residual CAD.  Most recent echo with EF 35 to 40% by transthoracic echo in April 2019, no residual apical thrombus.  Not compliant with meds per cardiology note.Would not take ACE inhibitor. Due for follow up. Last appt 06/2019.  No new chest pains.  No aspirin - off warfarin for past 6 months.told to stop warfarin . ? Dr. Donnetta Hutching. No aspirin recently. No hx of bleeding ulcer or other bleeding.   Diabetes: With hyperglycemia, history of CAD as above. Ozempic refilled at October 19 urgent care visit. Takes 0.$RemoveBefore'25mg'MHyseklzXzhYI$  ozempic q week.  No recent missed doses. Home readings - 124. No symptomatic lows, no highs - highest 195.  Allergic to Metformin. No recent A1c. Not on ACE inhibitor as above but he is on atorvastatin 20 mg daily. No recent optho visit - has optho - he will schedule.  History Patient Active Problem List   Diagnosis Date Noted  . History of fasciotomy 06/07/2016  . Status post coronary artery stent placement   . Acute combined systolic and diastolic heart failure ([place])   . Abnormal CXR   . Abdominal pain   . Chest pain   . NSTEMI (non-ST elevated myocardial infarction) ([place])   . Fever   . Thromboembolism ([place])   . Vascular occlusion   . Pain   . Anterior subendocardial MI ([place]) 02/21/2016    . Right leg pain 02/21/2016  . Non-STEMI (non-ST elevated myocardial infarction) ([place]) 02/21/2016  . LV (left ventricular) mural thrombus 02/21/2016  . Elevated troponin 02/21/2016  . Abnormal EKG 02/21/2016  . Acute MI ([place]) 02/21/2016  . History of embolectomy 02/21/2016  . Diabetes [facility])    Past Medical History:  Diagnosis Date  . Diabetes ([place])   . Hypercholesteremia   . LV (left ventricular) mural thrombus   . NSTEMI (non-ST elevated myocardial infarction) [facility])    Past Surgical History:  Procedure Laterality Date  . APPLICATION OF WOUND VAC Right 02/22/2016   Procedure: APPLICATION OF Negative Pressure Dressing Right Lower Leg;  Surgeon: Conrad Helix, MD;  Location: [place];  Service: Vascular;  Laterality: Right;  . APPLICATION OF WOUND VAC Right 02/26/2016   Procedure: APPLICATION OF WOUND VAC, RIGHT MEDIAL LOWER LEG FASCIOTOMY SITE;  Surgeon: Rosetta Posner, MD;  Location: [place];  Service: Vascular;  Laterality: Right;  . CARDIAC CATHETERIZATION N/A 03/09/2016   Procedure: Coronary Stent Intervention;  Surgeon: Wellington Hampshire, MD;  Location: [place] CV LAB;  Service: Cardiovascular;  Laterality: N/A;  distal RCA mid LAD  . CARDIAC CATHETERIZATION N/A 03/09/2016   Procedure: Intravascular Pressure Wire/FFR Study;  Surgeon: Wellington Hampshire, MD;  Location: [place] [facility];  Service: Cardiovascular;  Laterality: N/A;  mid LAD  . CARDIAC  CATHETERIZATION N/A 03/09/2016   Procedure: Left Heart Cath and Coronary Angiography;  Surgeon: Wellington Hampshire, MD;  Location: [place] [facility];  Service: Cardiovascular;  Laterality: N/A;  . EMBOLECTOMY Right 02/21/2016   Procedure: EMBOLECTOMY RIGHT COMMON FEMORAL ARTERY; FOUR COMPARTMENT FASCIOTOMY;  Surgeon: Conrad Sherman, MD;  Location: [place];  Service: Vascular;  Laterality: Right;  . FASCIOTOMY CLOSURE Right 02/26/2016   Procedure: RIGHT LATERAL LOWER LEG FASCIOTOMY CLOSURE;  Surgeon: Rosetta Posner, MD;  Location: [place];  Service:  Vascular;  Laterality: Right;  . HEMATOMA EVACUATION Right 02/22/2016   Procedure: EVACUATION HEMATOMA With Placement of Negative Pressure Dressing;  Surgeon: Conrad [place], MD;  Location: [place];  Service: Vascular;  Laterality: Right;   Allergies  Allergen Reactions  . Metformin     Other reaction(s): Dizziness (intolerance)   Prior to Admission medications   Medication Sig Start Date End Date Taking? Authorizing Provider  atorvastatin (LIPITOR) 20 MG tablet TAKE 1 TABLET BY MOUTH ONCE DAILY 01/07/19  Yes Josue Hector, MD  carvedilol (COREG) 6.25 MG tablet TAKE 1 TABLET BY MOUTH TWICE DAILY WITH MEALS. PLEASE KEEP UPCOMING APPT FOR FUTURE REFILLS 08/26/19  Yes Josue Hector, MD  diclofenac Sodium (VOLTAREN) 1 % GEL Apply 2 g topically 4 (four) times daily. 04/24/20  Yes Burky, Lanelle Bal B, NP  glucose blood (ONETOUCH VERIO) test strip 1 strip by In Vitro route 2 times daily. Dx: E11.9 06/16/20  Yes Vanessa Kick, MD  Semaglutide,0.25 or 0.5MG /DOS, (OZEMPIC, 0.25 OR 0.5 MG/DOSE,) 2 MG/1.5ML SOPN Inject 0.5 mg into the skin once a week. 06/16/20  Yes Vanessa Kick, MD  HYDROcodone-acetaminophen (NORCO/VICODIN) 5-325 MG tablet Take 1 tablet by mouth every 6 (six) hours as needed for moderate pain or severe pain. Patient not taking: Reported on 07/06/2020 06/16/20   Vanessa Kick, MD  IBUPROFEN PO Take by mouth. Patient not taking: Reported on 07/06/2020    [provider]  PENICILLIN V POTASSIUM PO Take by mouth. Patient not taking: Reported on 07/06/2020    [provider]   Social History   Socioeconomic History  . Marital status: Married    Spouse name: [name]  . Number of children: 5  . Years of education: COLLEGE  . Highest education level: Not on file  Occupational History  . Occupation: UNEMPLOYED  Tobacco Use  . Smoking status: Never Smoker  . Smokeless tobacco: Never Used  Vaping Use  . Vaping Use: Never used  Substance and Sexual Activity  . Alcohol use: No    . Drug use: No  . Sexual activity: Not on file  Other Topics Concern  . Not on file  Social History Narrative  . Not on file   Social Determinants of Health   Financial Resource Strain:   . Difficulty of Paying Living Expenses: Not on file  Food Insecurity:   . Worried About Charity fundraiser in the Last Year: Not on file  . Ran Out of Food in the Last Year: Not on file  Transportation Needs:   . Lack of Transportation (Medical): Not on file  . Lack of Transportation (Non-Medical): Not on file  Physical Activity:   . Days of Exercise per Week: Not on file  . Minutes of Exercise per Session: Not on file  Stress:   . Feeling of Stress : Not on file  Social Connections:   . Frequency of Communication with Friends and Family: Not on file  . Frequency of  Social Gatherings with Friends and Family: Not on file  . Attends Religious Services: Not on file  . Active Member of Clubs or Organizations: Not on file  . Attends Archivist Meetings: Not on file  . Marital Status: Not on file  Intimate Partner Violence:   . Fear of Current or Ex-Partner: Not on file  . Emotionally Abused: Not on file  . Physically Abused: Not on file  . Sexually Abused: Not on file    Review of Systems   Objective:   Vitals:   07/06/20 1540  BP: 122/81  Pulse: 83  Temp: 98.5 F (36.9 C)  TempSrc: Temporal  SpO2: 98%  Weight: 207 lb (93.9 kg)  Height: 5\' 8"  (1.727 m)     Physical Exam Vitals reviewed.  Constitutional:      Appearance: He is well-developed.  HENT:     Head: Normocephalic and atraumatic.  Eyes:     Pupils: Pupils are equal, round, and reactive to light.  Neck:     Vascular: No carotid bruit or JVD.  Cardiovascular:     Rate and Rhythm: Normal rate and regular rhythm.     Heart sounds: Normal heart sounds. No murmur heard.   Pulmonary:     Effort: Pulmonary effort is normal.     Breath sounds: Normal breath sounds. No rales.  Skin:    General: Skin is  warm and dry.  Neurological:     Mental Status: He is alert and oriented to person, place, and time.      31 minutes spent during visit, greater than 50% counseling and assimilation of information, chart review, and discussion of plan.    Assessment & Plan:  Delon AL Darden Palmer Londell Moh is a 59 y.o. male . Acute combined systolic and diastolic heart failure (HCC) - Plan: Comprehensive metabolic panel, Lipid panel NSTEMI (non-ST elevated myocardial infarction) ([place])  -Asymptomatic at present.  Cardiology recommendations as above, due for follow-up with cardiology.  Recommended he call to schedule appointment.  I do suspect he is supposed to be on aspirin 81 mg,, can also discuss with cardiology.  Type 2 diabetes mellitus with hyperglycemia, without long-term current use of insulin (HCC) - Plan: Semaglutide,0.25 or 0.5MG /DOS, (OZEMPIC, 0.25 OR 0.5 MG/DOSE,) 2 MG/1.5ML SOPN, Hemoglobin A1c, Microalbumin / creatinine urine ratio  -Unknown recent control, although no concerning home readings.  Check A1c.  Continue Ozempic for now then further meds/adjustments based on results.  Recheck in 2 weeks.  Ophthalmology eval also recommended.  Meds ordered this encounter  Medications  . Semaglutide,0.25 or 0.5MG /DOS, (OZEMPIC, 0.25 OR 0.5 MG/DOSE,) 2 MG/1.5ML SOPN    Sig: Inject 0.25 mg into the skin once a week.    Dispense:  3 mL    Refill:  3   Patient Instructions    It looks like you should be on aspirin 81mg  per day. Can also discuss with Dr. Johnsie Cancel. Please call his office for appointment.   No change in meds for now. I will check labs - fasting lab visit tomorrow.   Follow up with eye specialist - please call for appointment.   Please follow in next 2 weeks to discuss labs and med changes.    Type 2 Diabetes Mellitus, Self Care, Adult When you have type 2 diabetes (type 2 diabetes mellitus), you must make sure your blood sugar (glucose) stays in a healthy range. You can do this  with:  Nutrition.  Exercise.  Lifestyle changes.  Medicines or insulin, if  needed.  Support from your doctors and others. How to stay aware of blood sugar   Check your blood sugar level every day, as often as told.  Have your A1c (hemoglobin A1c) level checked two or more times a year. Have it checked more often if your doctor tells you to. Your doctor will set personal treatment goals for you. Generally, you should have these blood sugar levels:  Before meals (preprandial): 80-130 mg/dL (4.4-7.2 mmol/L).  After meals (postprandial): below 180 mg/dL (10 mmol/L).  A1c level: less than 7%. How to manage high and low blood sugar Signs of high blood sugar High blood sugar is called hyperglycemia. Know the signs of high blood sugar. Signs may include:  Feeling: ? Thirsty. ? Hungry. ? Very tired.  Needing to pee (urinate) more than usual.  Blurry vision. Signs of low blood sugar Low blood sugar is called hypoglycemia. This is when blood sugar is at or below 70 mg/dL (3.9 mmol/L). Signs may include:  Feeling: ? Hungry. ? Worried or nervous (anxious). ? Sweaty and clammy. ? Confused. ? Dizzy. ? Sleepy. ? Sick to your stomach (nauseous).  Having: ? A fast heartbeat. ? A headache. ? A change in your vision. ? Jerky movements that you cannot control (seizure). ? Tingling or no feeling (numbness) around your mouth, lips, or tongue.  Having trouble with: ? Moving (coordination). ? Sleeping. ? Passing out (fainting). ? Getting upset easily (irritability). Treating low blood sugar To treat low blood sugar, eat or drink something sugary right away. If you can think clearly and swallow safely, follow the 15:15 rule:  Take 15 grams of a fast-acting carb (carbohydrate). Talk with your doctor about how much you should take.  Some fast-acting carbs are: ? Sugar tablets (glucose pills). Take 3-4 pills. ? 6-8 pieces of hard candy. ? 4-6 oz (120-150 mL) of fruit  juice. ? 4-6 oz (120-150 mL) of regular (not diet) soda. ? 1 Tbsp (15 mL) honey or sugar.  Check your blood sugar 15 minutes after you take the carb.  If your blood sugar is still at or below 70 mg/dL (3.9 mmol/L), take 15 grams of a carb again.  If your blood sugar does not go above 70 mg/dL (3.9 mmol/L) after 3 tries, get help right away.  After your blood sugar goes back to normal, eat a meal or a snack within 1 hour. Treating very low blood sugar If your blood sugar is at or below 54 mg/dL (3 mmol/L), you have very low blood sugar (severe hypoglycemia). This is an emergency. Do not wait to see if the symptoms will go away. Get medical help right away. Call your local emergency services (911 in the [place] If you have very low blood sugar and you cannot eat or drink, you may need a glucagon shot (injection). A family member or friend should learn how to check your blood sugar and how to give you a glucagon shot. Ask your doctor if you need to have a glucagon shot kit at home. Follow these instructions at home: Medicine  Take insulin and diabetes medicines as told.  If your doctor says you should take more or less insulin and medicines, do this exactly as told.  Do not run out of insulin or medicines. Having diabetes can raise your risk for other long-term conditions. These include heart disease and kidney disease. Your doctor may prescribe medicines to help you not have these problems. Food   Make healthy food choices. These include: ?  Chicken, fish, egg whites, and beans. ? Oats, whole wheat, bulgur, brown rice, quinoa, and millet. ? Fresh fruits and vegetables. ? Low-fat dairy products. ? Nuts, avocado, olive oil, and canola oil.  Meet with a food specialist (dietitian). He or she can help you make an eating plan that is right for you.  Follow instructions from your doctor about what you cannot eat or drink.  Drink enough fluid to keep your pee (urine) pale yellow.  Keep  track of carbs that you eat. Do this by reading food labels and learning food serving sizes.  Follow your sick day plan when you cannot eat or drink normally. Make this plan with your doctor so it is ready to use. Activity  Exercise 3 or more times a week.  Do not go more than 2 days without exercising.  Talk with your doctor before you start a new exercise. Your doctor may need to tell you to change: ? How much insulin or medicines you take. ? How much food you eat. Lifestyle  Do not use any tobacco products. These include cigarettes, chewing tobacco, and e-cigarettes. If you need help quitting, ask your doctor.  Ask your doctor how much alcohol is safe for you.  Learn to deal with stress. If you need help with this, ask your doctor. Body care   Stay up to date with your shots (immunizations).  Have your eyes and feet checked by a doctor as often as told.  Check your skin and feet every day. Check for cuts, bruises, redness, blisters, or sores.  Brush your teeth and gums two times a day. Floss one or more times a day.  Go to the dentist one or more times every 6 months.  Stay at a healthy weight. General instructions  Take over-the-counter and prescription medicines only as told by your doctor.  Share your diabetes care plan with: ? Your work or school. ? People you live with.  Carry a card or wear jewelry that says you have diabetes.  Keep all follow-up visits as told by your doctor. This is important. Questions to ask your doctor  Do I need to meet with a diabetes educator?  Where can I find a support group for people with diabetes? Where to find more information To learn more about diabetes, visit:  American Diabetes Association: www.diabetes.org  American Association of Diabetes Educators: www.diabeteseducator.org Summary  When you have type 2 diabetes, you must make sure your blood sugar (glucose) stays in a healthy range.  Check your blood sugar every  day, as often as told.  Having diabetes can raise your risk for other conditions. Your doctor may prescribe medicines to help you not have these problems.  Keep all follow-up visits as told by your doctor. This is important. This information is not intended to replace advice given to you by your health care provider. Make sure you discuss any questions you have with your health care provider. Document Revised: 02/05/2018 Document Reviewed: 09/18/2015 Elsevier Patient Education  [employer].   If you have lab work done today you will be contacted with your lab results within the next 2 weeks.  If you have not heard from [place] then please contact us. The fastest way to get your results is to register for My Chart.   IF you received an x-ray today, you will receive an invoice from [facility]. Please contact [facility] at [phone] with questions or concerns regarding your invoice.   IF you received  labwork today, you will receive an invoice from [employer] [facility]. Please contact [facility] at [phone] with questions or concerns regarding your invoice.   Our billing staff will not be able to assist you with questions regarding bills from these companies.  You will be contacted with the lab results as soon as they are available. The fastest way to get your results is to activate your My Chart account. Instructions are located on the last page of this paperwork. If you have not heard from [place] regarding the results in 2 weeks, please contact this office.         Signed, Merri Ray, MD [facility]

## 2020-07-06 NOTE — Patient Instructions (Addendum)
It looks like you should be on aspirin 61m per day. Can also discuss with Dr. NJohnsie Cancel Please call his office for appointment.   No change in meds for now. I will check labs - fasting lab visit tomorrow.   Follow up with eye specialist - please call for appointment.   Please follow in next 2 weeks to discuss labs and med changes.    Type 2 Diabetes Mellitus, Self Care, Adult When you have type 2 diabetes (type 2 diabetes mellitus), you must make sure your blood sugar (glucose) stays in a healthy range. You can do this with:  Nutrition.  Exercise.  Lifestyle changes.  Medicines or insulin, if needed.  Support from your doctors and others. How to stay aware of blood sugar   Check your blood sugar level every day, as often as told.  Have your A1c (hemoglobin A1c) level checked two or more times a year. Have it checked more often if your doctor tells you to. Your doctor will set personal treatment goals for you. Generally, you should have these blood sugar levels:  Before meals (preprandial): 80-130 mg/dL (4.4-7.2 mmol/L).  After meals (postprandial): below 180 mg/dL (10 mmol/L).  A1c level: less than 7%. How to manage high and low blood sugar Signs of high blood sugar High blood sugar is called hyperglycemia. Know the signs of high blood sugar. Signs may include:  Feeling: ? Thirsty. ? Hungry. ? Very tired.  Needing to pee (urinate) more than usual.  Blurry vision. Signs of low blood sugar Low blood sugar is called hypoglycemia. This is when blood sugar is at or below 70 mg/dL (3.9 mmol/L). Signs may include:  Feeling: ? Hungry. ? Worried or nervous (anxious). ? Sweaty and clammy. ? Confused. ? Dizzy. ? Sleepy. ? Sick to your stomach (nauseous).  Having: ? A fast heartbeat. ? A headache. ? A change in your vision. ? Jerky movements that you cannot control (seizure). ? Tingling or no feeling (numbness) around your mouth, lips, or tongue.  Having trouble  with: ? Moving (coordination). ? Sleeping. ? Passing out (fainting). ? Getting upset easily (irritability). Treating low blood sugar To treat low blood sugar, eat or drink something sugary right away. If you can think clearly and swallow safely, follow the 15:15 rule:  Take 15 grams of a fast-acting carb (carbohydrate). Talk with your doctor about how much you should take.  Some fast-acting carbs are: ? Sugar tablets (glucose pills). Take 3-4 pills. ? 6-8 pieces of hard candy. ? 4-6 oz (120-150 mL) of fruit juice. ? 4-6 oz (120-150 mL) of regular (not diet) soda. ? 1 Tbsp (15 mL) honey or sugar.  Check your blood sugar 15 minutes after you take the carb.  If your blood sugar is still at or below 70 mg/dL (3.9 mmol/L), take 15 grams of a carb again.  If your blood sugar does not go above 70 mg/dL (3.9 mmol/L) after 3 tries, get help right away.  After your blood sugar goes back to normal, eat a meal or a snack within 1 hour. Treating very low blood sugar If your blood sugar is at or below 54 mg/dL (3 mmol/L), you have very low blood sugar (severe hypoglycemia). This is an emergency. Do not wait to see if the symptoms will go away. Get medical help right away. Call your local emergency services (911 in the [place] If you have very low blood sugar and you cannot eat or drink, you may need a glucagon shot (  injection). A family member or friend should learn how to check your blood sugar and how to give you a glucagon shot. Ask your doctor if you need to have a glucagon shot kit at home. Follow these instructions at home: Medicine  Take insulin and diabetes medicines as told.  If your doctor says you should take more or less insulin and medicines, do this exactly as told.  Do not run out of insulin or medicines. Having diabetes can raise your risk for other long-term conditions. These include heart disease and kidney disease. Your doctor may prescribe medicines to help you not have these  problems. Food   Make healthy food choices. These include: ? Chicken, fish, egg whites, and beans. ? Oats, whole wheat, bulgur, brown rice, quinoa, and millet. ? Fresh fruits and vegetables. ? Low-fat dairy products. ? Nuts, avocado, olive oil, and canola oil.  Meet with a food specialist (dietitian). He or she can help you make an eating plan that is right for you.  Follow instructions from your doctor about what you cannot eat or drink.  Drink enough fluid to keep your pee (urine) pale yellow.  Keep track of carbs that you eat. Do this by reading food labels and learning food serving sizes.  Follow your sick day plan when you cannot eat or drink normally. Make this plan with your doctor so it is ready to use. Activity  Exercise 3 or more times a week.  Do not go more than 2 days without exercising.  Talk with your doctor before you start a new exercise. Your doctor may need to tell you to change: ? How much insulin or medicines you take. ? How much food you eat. Lifestyle  Do not use any tobacco products. These include cigarettes, chewing tobacco, and e-cigarettes. If you need help quitting, ask your doctor.  Ask your doctor how much alcohol is safe for you.  Learn to deal with stress. If you need help with this, ask your doctor. Body care   Stay up to date with your shots (immunizations).  Have your eyes and feet checked by a doctor as often as told.  Check your skin and feet every day. Check for cuts, bruises, redness, blisters, or sores.  Brush your teeth and gums two times a day. Floss one or more times a day.  Go to the dentist one or more times every 6 months.  Stay at a healthy weight. General instructions  Take over-the-counter and prescription medicines only as told by your doctor.  Share your diabetes care plan with: ? Your work or school. ? People you live with.  Carry a card or wear jewelry that says you have diabetes.  Keep all follow-up  visits as told by your doctor. This is important. Questions to ask your doctor  Do I need to meet with a diabetes educator?  Where can I find a support group for people with diabetes? Where to find more information To learn more about diabetes, visit:  American Diabetes Association: www.diabetes.org  American Association of Diabetes Educators: www.diabeteseducator.org Summary  When you have type 2 diabetes, you must make sure your blood sugar (glucose) stays in a healthy range.  Check your blood sugar every day, as often as told.  Having diabetes can raise your risk for other conditions. Your doctor may prescribe medicines to help you not have these problems.  Keep all follow-up visits as told by your doctor. This is important. This information is not intended to  replace advice given to you by your health care provider. Make sure you discuss any questions you have with your health care provider. Document Revised: 02/05/2018 Document Reviewed: 09/18/2015 Elsevier Patient Education  [employer].   If you have lab work done today you will be contacted with your lab results within the next 2 weeks.  If you have not heard from [place] then please contact us. The fastest way to get your results is to register for My Chart.   IF you received an x-ray today, you will receive an invoice from [facility] Rad[facility]. Please contact [facility] at [phone] with questions or concerns regarding your invoice.   IF you received labwork today, you will receive an invoice from [facility]. Please contact [facility] at [phone] with questions or concerns regarding your invoice.   Our billing staff will not be able to assist you with questions regarding bills from these companies.  You will be contacted with the lab results as soon as they are available. The fastest way to get your results is to activate your My Chart account. Instructions are located on the last page of this paperwork. If  you have not heard from [place] regarding the results in 2 weeks, please contact this office.

## 2020-07-07 ENCOUNTER — Encounter: Payer: Self-pay | Admitting: Family Medicine

## 2020-07-07 ENCOUNTER — Ambulatory Visit (INDEPENDENT_AMBULATORY_CARE_PROVIDER_SITE_OTHER): Payer: 59 | Admitting: Registered Nurse

## 2020-07-07 DIAGNOSIS — I5041 Acute combined systolic (congestive) and diastolic (congestive) heart failure: Secondary | ICD-10-CM

## 2020-07-07 DIAGNOSIS — E1165 Type 2 diabetes mellitus with hyperglycemia: Secondary | ICD-10-CM

## 2020-07-08 LAB — COMPREHENSIVE METABOLIC PANEL
ALT: 39 IU/L (ref 0–44)
AST: 20 IU/L (ref 0–40)
Albumin/Globulin Ratio: 1.2 (ref 1.2–2.2)
Albumin: 3.9 g/dL (ref 3.8–4.8)
Alkaline Phosphatase: 64 IU/L (ref 44–121)
BUN/Creatinine Ratio: 16 (ref 10–24)
BUN: 18 mg/dL (ref 8–27)
Bilirubin Total: 0.4 mg/dL (ref 0.0–1.2)
CO2: 25 mmol/L (ref 20–29)
Calcium: 9.3 mg/dL (ref 8.6–10.2)
Chloride: 104 mmol/L (ref 96–106)
Creatinine, Ser: 1.1 mg/dL (ref 0.76–1.27)
GFR calc Af Amer: 83 mL/min/{1.73_m2} (ref >59)
GFR calc non Af Amer: 72 mL/min/{1.73_m2} (ref >59)
Globulin, Total: 3.3 g/dL (ref 1.5–4.5)
Glucose: 154 mg/dL — ABNORMAL HIGH (ref 65–99)
Potassium: 4.3 mmol/L (ref 3.5–5.2)
Sodium: 135 mmol/L (ref 134–144)
Total Protein: 7.2 g/dL (ref 6.0–8.5)

## 2020-07-08 LAB — HEMOGLOBIN A1C
Est. average glucose Bld gHb Est-mCnc: 186 mg/dL
Hgb A1c MFr Bld: 8.1 % — ABNORMAL HIGH (ref 4.8–5.6)

## 2020-07-08 LAB — MICROALBUMIN / CREATININE URINE RATIO
Creatinine, Urine: 98.5 mg/dL
Microalb/Creat Ratio: 4 mg/g creat (ref 0–29)
Microalbumin, Urine: 4.4 ug/mL

## 2020-07-08 LAB — LIPID PANEL
Chol/HDL Ratio: 3.2 ratio (ref 0.0–5.0)
Cholesterol, Total: 141 mg/dL (ref 100–199)
HDL: 44 mg/dL (ref >39)
LDL Chol Calc (NIH): 82 mg/dL (ref 0–99)
Triglycerides: 79 mg/dL (ref 0–149)
VLDL Cholesterol Cal: 15 mg/dL (ref 5–40)

## 2020-07-09 MED FILL — OZEMPIC 0.25 OR 0.5 MG/DOSE: 2 | 56 days supply | Qty: 2 | Fill #0

## 2020-07-29 ENCOUNTER — Ambulatory Visit: Payer: 59 | Admitting: Family Medicine

## 2020-07-29 NOTE — Progress Notes (Deleted)
CARDIOLOGY OFFICE NOTE  Date:  07/29/2020    Troy Yu AL Troy Yu Date of Birth: 1961/04/25 Medical Record #[id number]  PCP:  Shade Flood, MD  Cardiologist:  Eden Emms  No chief complaint on file.   History of Present Illness: 59 y.o. seen initially in ER June 2017 with subacute anterior MI complicated by distal embolization to right leg. Had a complicated RLE embolectomy requiring fasciotomy by Dr Imogene Burn with long healing process including wound Vac. Had DES to distal RCA and mid LAD with EF 30-35% large apical thrombus. Diagnosed with DM Has refused a lot of meds and care.   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 Diabetes (HCC)   . Hypercholesteremia   . LV (left ventricular) mural thrombus   . NSTEMI (non-ST elevated myocardial infarction) [facility])     Past Surgical History:  Procedure Laterality Date  . APPLICATION OF WOUND VAC Right 02/22/2016   Procedure: APPLICATION OF Negative Pressure Dressing Right Lower Leg;  Surgeon: Fransisco Hertz, MD;  Location: [facility] OR;  Service: Vascular;  Laterality: Right;  . APPLICATION OF WOUND VAC Right 02/26/2016   Procedure: APPLICATION OF WOUND VAC, RIGHT MEDIAL LOWER LEG FASCIOTOMY SITE;  Surgeon: Larina Earthly, MD;  Location: [facility] OR;  Service: Vascular;  Laterality: Right;  . CARDIAC CATHETERIZATION N/A 03/09/2016   Procedure: Coronary Stent Intervention;  Surgeon: Iran Ouch, MD;  Location: [facility] INVASIVE CV LAB;  Service:  Cardiovascular;  Laterality: N/A;  distal RCA mid LAD  . CARDIAC CATHETERIZATION N/A 03/09/2016   Procedure: Intravascular Pressure Wire/FFR Study;  Surgeon: Iran Ouch, MD;  Location[facility] INVASIVE CV LAB;  Service: Cardiovascular;  Laterality: N/A;  mid LAD  . CARDIAC CATHETERIZATION N/A 03/09/2016   Procedure: Left Heart Cath and Coronary Angiography;  Surgeon: Iran Ouch, MD;  Location: [facility] INVASIVE CV LAB;  Service: Cardiovascular;  Laterality: N/A;  . EMBOLECTOMY Right 02/21/2016   Procedure: EMBOLECTOMY RIGHT COMMON FEMORAL ARTERY; FOUR COMPARTMENT FASCIOTOMY;  Surgeon: Fransisco Hertz, MD;  Location: [facility] OR;  Service: Vascular;  Laterality: Right;  . FASCIOTOMY CLOSURE Right 02/26/2016   Procedure: RIGHT LATERAL LOWER LEG FASCIOTOMY CLOSURE;  Surgeon: Larina Earthly, MD;  Location: [facility] OR;  Service: Vascular;  Laterality: Right;  . HEMATOMA EVACUATION Right 02/22/2016   Procedure: EVACUATION HEMATOMA With Placement of Negative Pressure Dressing;  Surgeon: Fransisco Hertz, MD;  Location: [facility] OR;  Service: Vascular;  Laterality: Right;     Medications: Current Outpatient Medications  Medication Sig Dispense Refill  . atorvastatin (LIPITOR) 20 MG tablet TAKE 1 TABLET BY MOUTH ONCE DAILY 90 tablet 1  . carvedilol (COREG) 6.25 MG tablet TAKE 1 TABLET BY MOUTH TWICE DAILY WITH MEALS. PLEASE KEEP UPCOMING APPT FOR FUTURE REFILLS 180 tablet 2  . diclofenac Sodium (VOLTAREN) 1 % GEL Apply 2 g topically 4 (four) times  daily. 350 g 0  . glucose blood (ONETOUCH VERIO) test strip 1 strip by In Vitro route 2 times daily. Dx: E11.9 100 each 0  . Semaglutide,0.25 or 0.5MG /DOS, (OZEMPIC, 0.25 OR 0.5 MG/DOSE,) 2 MG/1.5ML SOPN Inject 0.25 mg into the skin once a week. 3 mL 3   No current facility-administered medications for this visit.    Allergies: Allergies  Allergen Reactions  . Metformin     Other reaction(s): Dizziness (intolerance)    Social History: The patient  reports that he has never  smoked. He has never used smokeless tobacco. He reports that he does not drink alcohol and does not use drugs.   Family History: The patient's family history includes Heart Problems in his father and mother.   Review of Systems: Please see the history of present illness.   Otherwise, the review of systems is positive for none.   All other systems are reviewed and negative.   Physical Exam: VS:  There were no vitals taken for this visit. Marland Kitchen  BMI There is no height or weight on file to calculate BMI.  Wt Readings from Last 3 Encounters:  07/06/20 93.9 kg  07/16/19 96.2 kg  07/12/18 95 kg   Affect appropriate Healthy:  appears stated age HEENT: normal Neck supple with no adenopathy JVP normal no bruits no thyromegaly Lungs clear with no wheezing and good diaphragmatic motion Heart:  S1/S2 no murmur, no rub, gallop or click PMI enlarged  Abdomen: benighn, BS positve, no tenderness, no AAA no bruit.  No HSM or HJR Distal pulses intact with no bruits No edema Neuro non-focal Skin warm and dry No muscular weakness Fasciotomy scars RLE with some foot drop      LABORATORY DATA:  EKG:   .07/16/19  SR rate 78 old anterior MI   Lab Results  Component Value Date   WBC 7.1 04/04/2016   HGB 11.6 (L) 04/04/2016   HCT 36.3 (L) 04/04/2016   PLT 402 (H) 04/04/2016   GLUCOSE 154 (H) 07/07/2020   CHOL 141 07/07/2020   TRIG 79 07/07/2020   HDL 44 07/07/2020   LDLCALC 82 07/07/2020   ALT 39 07/07/2020   AST 20 07/07/2020   NA 135 07/07/2020   K 4.3 07/07/2020   CL 104 07/07/2020   CREATININE 1.10 07/07/2020   BUN 18 07/07/2020   CO2 25 07/07/2020   TSH 3.200 03/03/2016   INR 2.4 09/16/2019   HGBA1C 8.1 (H) 07/07/2020    BNP (last 3 results) No results for input(s): BNP in the last 8760 hours.  ProBNP (last 3 results) No results for input(s): PROBNP in the last 8760 hours.   Other Studies Reviewed Today:  Cardiac Cath Conclusion from 02/2016    Prox RCA lesion, 10%  stenosed.  Dist LAD lesion, 60% stenosed.  Dist RCA lesion, 90% stenosed. Post intervention, there is a 0% residual stenosis.  Mid LAD lesion, 60% stenosed. Post intervention, there is a 0% residual stenosis.  1. Significant 2 vessel coronary artery disease involving the distal right coronary artery and mid LAD. The culprit for recent myocardial infarction seems to be the mid LAD stenosis which appears to be thrombotic and hazy in spite of being only 60% in severity. However, this was significant by FFR.  2. Moderately elevated left ventricular end-diastolic pressure. LV angiography was not performed due to LV thrombus.  3. Successful angioplasty and drug-eluting stent placement to the distal right coronary artery and mid left anterior descending artery.  Recommendations: Recommend treatment with aspirin, Plavix and warfarin for one month. After one month, aspirin can be discontinued. Heparin can be resumed today 8 hours after sheath pull. Warfarin can be started. I switched from propranolol to carvedilol. Continue treatment for cardiomyopathy and add an ACE inhibitor or ARB before hospital discharge. There was residual 60% distal LAD stenosis which was left to be treated medically.   Echo Study Conclusions from 12/19/17   Study Conclusions  - Left ventricle: The cavity size was normal. Systolic function was   moderately reduced. The estimated ejection fraction was in the   range of 35% to 40%. Doppler parameters are consistent with   abnormal left ventricular relaxation (grade 1 diastolic   dysfunction). Mild to moderate concentric and moderate focal   basal septal hypertrophy. - Regional wall motion abnormality: Akinesis of the mid   anteroseptal and apical myocardium; severe hypokinesis of the   apical anterior, apical inferior, and apical septal myocardium;   moderate hypokinesis of the apical lateral myocardium. - Aortic valve: There was mild regurgitation. - Aorta:  Mild aortic root dilatation. .  Assessment/Plan: 1. Anterior MI - July 2017  with associated LV dysfunction. Cardiac catheterization with DES to distal RCA and Mid LAD. Residual CAD noted. Would plan to treat medically.  81 mg ASA  2. Ischemic CM - with EF of 30 to 35%. This improved to 35-40% by TTE done 12/19/17 no residual apical thrombus Not compliant With meds and not taking ARB/ACE    3. LV (left ventricular) mural thrombus (HCC) with embolization of right femoral artery resulting in limb ischemia s/p thrombectomy and fasciotomy with post operative course complicated by hematoma and bleeding s/p evacuation of hematoma with application of a VAC in the setting of therapeutic anti-coagulation. Seen by Dr. Arbie Cookey. Finally healed  TTE 12/19/17 thrombus resolved now off anticoagulation   4. Diabetes mellitus/Hyperglycemia -Hemoglobin A1c 9.2. f/u with primary currently not taking any meds  -supposed to be on Januvia   6. PVD:   Still with foot drop. F/u with [facility] Dr Early   7. Cholesterol:  Was at goal he cut his lipitor back on his own LDL now 82 discussed going back to 40 mg dose ***  Lab Results  Component Value Date   LDLCALC 82 07/07/2020       Troy Yu

## 2020-07-30 ENCOUNTER — Ambulatory Visit: Payer: 59 | Admitting: Cardiovascular Disease

## 2020-07-30 ENCOUNTER — Encounter: Payer: Self-pay | Admitting: Family Medicine

## 2020-07-31 ENCOUNTER — Ambulatory Visit (INDEPENDENT_AMBULATORY_CARE_PROVIDER_SITE_OTHER): Payer: 59 | Admitting: Family Medicine

## 2020-07-31 ENCOUNTER — Other Ambulatory Visit: Payer: Self-pay

## 2020-07-31 ENCOUNTER — Other Ambulatory Visit (HOSPITAL_COMMUNITY): Payer: Self-pay | Admitting: Family Medicine

## 2020-07-31 ENCOUNTER — Encounter: Payer: Self-pay | Admitting: Family Medicine

## 2020-07-31 VITALS — BP 116/77 | HR 85 | Temp 98.4°F | Ht 68.0 in | Wt 211.0 lb

## 2020-07-31 DIAGNOSIS — K0889 Other specified disorders of teeth and supporting structures: Secondary | ICD-10-CM

## 2020-07-31 DIAGNOSIS — K068 Other specified disorders of gingiva and edentulous alveolar ridge: Secondary | ICD-10-CM | POA: Diagnosis not present

## 2020-07-31 DIAGNOSIS — E1165 Type 2 diabetes mellitus with hyperglycemia: Secondary | ICD-10-CM

## 2020-07-31 MED ORDER — OZEMPIC (0.25 OR 0.5 MG/DOSE) 2 MG/1.5ML ~~LOC~~ SOPN: 0.5000 mg | PEN_INJECTOR | SUBCUTANEOUS | 3 refills | Status: DC

## 2020-07-31 MED ORDER — HYDROCODONE-ACETAMINOPHEN 5-325 MG PO TABS: 1.0000 | ORAL_TABLET | Freq: Four times a day (QID) | ORAL | 0 refills | Status: DC | PRN

## 2020-07-31 MED FILL — HYDROCODON-APAP 5-325: 5-325 | 1 days supply | Qty: 5 | Fill #0

## 2020-07-31 NOTE — Patient Instructions (Addendum)
Schedule eye doctor appointment Increase ozempic to 0.5mg  per week. Recheck A1c in 2 months.  Return to the clinic or go to the nearest emergency room if any of your symptoms worsen or new symptoms occur.  I do not see any obvious infection around the gums or teeth at this time but you did have a few loose teeth and are at risk for infection.  Please call your dentist for an appointment next week if at all possible.  I did write for a few stronger pain medications if needed, but ibuprofen is okay to use temporarily. Return to the clinic or go to the nearest emergency room if any of your symptoms worsen or new symptoms occur.  I do not see any active bleeding in your nose at this time, but there was a small raw area on the left inside of your nose that may have been the site from the previous bleeding. Try over-the-counter saline nasal spray a few times per day to help with the dry areas of irritated areas.  If bleeding returns, I would recommend a follow-up visit with visit with myself or urgent care or emergency room if you are unable to stop that bleeding.   Nosebleed, Adult A nosebleed is when blood comes out of the nose. Nosebleeds are common. Usually, they are not a sign of a serious condition. Nosebleeds can happen if a small blood vessel in your nose starts to bleed or if the lining of your nose (mucous membrane) cracks. They are commonly caused by:  Allergies.  Colds.  Picking your nose.  Blowing your nose too hard.  An injury from sticking an object into your nose or getting hit in the nose.  Dry or cold air. Less common causes of nosebleeds include:  Toxic fumes.  Something abnormal in the nose or in the air-filled spaces in the bones of the face (sinuses).  Growths in the nose, such as polyps.  Medicines or conditions that cause blood to clot slowly.  Certain illnesses or procedures that irritate or dry out the nasal passages. Follow these instructions at home: When  you have a nosebleed:   Sit down and tilt your head slightly forward.  Use a clean towel or tissue to pinch your nostrils under the bony part of your nose. After 10 minutes, let go of your nose and see if bleeding starts again. Do not release pressure before that time. If there is still bleeding, repeat the pinching and holding for 10 minutes until the bleeding stops.  Do not place tissues or gauze in the nose to stop bleeding.  Avoid lying down and avoid tilting your head backward. That may make blood collect in the throat and cause gagging or coughing.  Use a nasal spray decongestant to help with a nosebleed as told by your health care provider.  Do not use petroleum jelly or mineral oil in your nose. It can drip into your lungs. After a nosebleed:  Avoid blowing your nose or sniffing for a number of hours.  Avoid straining, lifting, or bending at the waist for several days. You may resume other normal activities as you are able.  Use saline spray or a humidifier as told by your health care provider.  Aspirinand blood thinners make bleeding more likely. If you are prescribed these medicines and you suffer from nosebleeds: ? Ask your health care provider if you should stop taking the medicines or if you should adjust the dose. ? Do not stop taking medicines  that your health care provider has recommended unless told by your health care provider.  If your nosebleed was caused by dry mucous membranes, use over-the-counter saline nasal spray or gel. This will keep the mucous membranes moist and allow them to heal. If you must use a lubricant: ? Choose one that is water-soluble. ? Use only as much as you need and use it only as often as needed. ? Do not lie down until several hours after you use it. Contact a health care provider if:  You have a fever.  You get nosebleeds often or more often than usual.  You bruise very easily.  You have a nosebleed from having something stuck in  your nose.  You have bleeding in your mouth.  You vomit or cough up brown material.  You have a nosebleed after you start a new medicine. Get help right away if:  You have a nosebleed after a fall or a head injury.  Your nosebleed does not go away after 20 minutes.  You feel dizzy or weak.  You have unusual bleeding from other parts of your body.  You have unusual bruising on other parts of your body.  You become sweaty.  You vomit blood. This information is not intended to replace advice given to you by your health care provider. Make sure you discuss any questions you have with your health care provider. Document Revised: 11/14/2017 Document Reviewed: 03/01/2016 Elsevier Patient Education  2020 [employer]   Dental Pain Dental pain may be caused by many things, including:  Tooth decay (cavities or caries). Cavities expose the nerve of your tooth to air and to hot or cold temperatures. This can cause pain or discomfort.  Abscess or infection. A dental abscess is a collection of pus from a bacterial infection in the inner part of the tooth (pulp). It usually occurs at the end of the root of a tooth.  Injury.  An unknown reason (idiopathic). Your pain may be mild or severe. It may occur when you are:  Chewing.  Exposed to hot or cold temperatures.  Eating or drinking sugary foods or beverages, such as soda or candy. Your pain may be constant, or it may come and go without cause. Follow these instructions at home:  Watch your dental pain for any changes. The following actions may help to lessen any discomfort that you are feeling: Medicines  Take over-the-counter and prescription medicines only as told by your health care provider.  If you were prescribed an antibiotic medicine, take it as told by your health care provider. Do not stop taking the antibiotic even if you start to feel better. Eating and drinking  Avoid foods or drinks that cause you pain, such  as: ? Very hot or very cold foods or drinks. ? Sweet or sugary foods or drinks. Managing pain and swelling  Apply ice to the painful area of your face: ? Put ice in a plastic bag. ? Place a towel between your skin and the bag. ? Leave the ice on for 20 minutes, 2-3 times a day. Brushing your teeth  To keep your mouth and gums healthy, use fluoride toothpaste to brush your teeth twice a day. Floss once a day.  Use a toothpaste made for sensitive teeth if directed by your health care provider.  Brush your teeth with a soft-bristled toothbrush. General instructions  Do not apply heat to the outside of your face.  Gargle with a salt-water mixture 3-4 times a day  or as needed. To make a salt-water mixture, completely dissolve -1 tsp of salt in 1 cup of warm water.  Keep all follow-up visits as told by your health care provider. This is important.  Apply ice to the outside of your jaw if there is swelling. Do not put ice directly on the skin. Contact a health care provider if:  Your pain is not controlled with medicines.  Your symptoms get worse.  You have new symptoms. Get help right away if you:  Are unable to open your mouth.  Are having trouble breathing or swallowing.  Have a fever.  Notice that your face, neck, or jaw is swollen. Summary  Dental pain may be caused by many things, including tooth decay and infection.  Your pain may be mild or severe.  Take over-the-counter and prescription medicines only as told by your health care provider.  Watch your dental pain for any changes. Let your health care provider know if symptoms get worse. This information is not intended to replace advice given to you by your health care provider. Make sure you discuss any questions you have with your health care provider. Document Revised: 12/11/2018 Document Reviewed: 07/06/2017 Elsevier Patient Education  [employer]     If you have lab work done today you will be  contacted with your lab results within the next 2 weeks.  If you have not heard from [place] then please contact us. The fastest way to get your results is to register for My Chart.   IF you received an x-ray today, you will receive an invoice from [facility]. Please contact [facility] at [phone] with questions or concerns regarding your invoice.   IF you received labwork today, you will receive an invoice from [place]. Please contact [facility] at [phone] with questions or concerns regarding your invoice.   Our billing staff will not be able to assist you with questions regarding bills from these companies.  You will be contacted with the lab results as soon as they are available. The fastest way to get your results is to activate your My Chart account. Instructions are located on the last page of this paperwork. If you have not heard from [place] regarding the results in 2 weeks, please contact this office.

## 2020-07-31 NOTE — Progress Notes (Signed)
Subjective:  Patient ID: Troy Yu, male    DOB: September 22, 1960  Age: 59 y.o. MRN: [id number]  CC:  Chief Complaint  Patient presents with  . Follow-up    on diabetes and review on lab results. Pt is concerned about is A1C increase and state he thinks he mad need an increase in his medication.  . Gumm pain    Pt reports that he thinks he may be having an infection in his gumms he has notied pain and some times blood when he brushes his teeth. pt is wondering if he can have an antibiotic until he sees his dentis. pt is also wonding if he can have a refill on his Voltaren gel for this pain.    HPI Adonus AL Alvia Yu presents for   Diabetes: With hyperglycemia. Discussed at November 8th visit.  A1c had increased to 8.1.  His Ozempic has been refilled in October at urgent care visit, was taken Ozempic once per week.  No missed doses.  Home readings 120s to 190s at last visit, unable to take Metformin.  Planned for him to schedule ophthalmology appointment. No recent home readings.  Less walking with cold weather and busy at work.    Lab Results  Component Value Date   HGBA1C 8.1 (H) 07/07/2020   HGBA1C 5.9 (H) 06/01/2016   HGBA1C 9.2 (H) 02/23/2016   Lab Results  Component Value Date   LDLCALC 82 07/07/2020   CREATININE 1.10 07/07/2020    Dental Pain: Pain in upper gums both sides. Past few weeks. Hurts to brush teeth, sore to eat at times.  No fever. No loss of teeth, but some movement. No pus/discharge.  Has dentist, no appt until February. ? Possible appt after urgent care visit in October.  Ibuprofen 800mg  - on occasion, every few days when feels pain only, not daily. Hydrocodone in Urgent care worked better.  Controlled substance database (PDMP) reviewed. No concerns appreciated. Last rx noted in 06/16/20.   Epistaxis: Blew nose once yesterday and small amount of blood yesterday am.  No continued bleeding. No active bleeding, no pressure needed and no  recurrence. Takes ASA.    History Patient Active Problem List   Diagnosis Date Noted  . History of fasciotomy 06/07/2016  . Status post coronary artery stent placement   . Acute combined systolic and diastolic heart failure (HCC)   . Abnormal CXR   . Abdominal pain   . Chest pain   . NSTEMI (non-ST elevated myocardial infarction) (HCC)   . Fever   . Thromboembolism (HCC)   . Vascular occlusion   . Pain   . Anterior subendocardial MI (HCC) 02/21/2016  . Right leg pain 02/21/2016  . Non-STEMI (non-ST elevated myocardial infarction) (HCC) 02/21/2016  . LV (left ventricular) mural thrombus 02/21/2016  . Elevated troponin 02/21/2016  . Abnormal EKG 02/21/2016  . Acute MI (HCC) 02/21/2016  . History of embolectomy 02/21/2016  . Diabetes [facility])    Past Medical History:  Diagnosis Date  . Diabetes (HCC)   . Hypercholesteremia   . LV (left ventricular) mural thrombus   . NSTEMI (non-ST elevated myocardial infarction) [facility])    Past Surgical History:  Procedure Laterality Date  . APPLICATION OF WOUND VAC Right 02/22/2016   Procedure: APPLICATION OF Negative Pressure Dressing Right Lower Leg;  Surgeon: Fransisco Hertz, MD;  Location: [facility] OR;  Service: Vascular;  Laterality: Right;  . APPLICATION OF WOUND VAC Right 02/26/2016   Procedure: APPLICATION OF  WOUND VAC, RIGHT MEDIAL LOWER LEG FASCIOTOMY SITE;  Surgeon: Larina Earthly, MD;  Location: [facility] OR;  Service: Vascular;  Laterality: Right;  . CARDIAC CATHETERIZATION N/A 03/09/2016   Procedure: Coronary Stent Intervention;  Surgeon: Iran Ouch, MD;  Location: [facility] INVASIVE CV LAB;  Service: Cardiovascular;  Laterality: N/A;  distal RCA mid LAD  . CARDIAC CATHETERIZATION N/A 03/09/2016   Procedure: Intravascular Pressure Wire/FFR Study;  Surgeon: Iran Ouch, MD;  Location: [facility] INVASIVE CV LAB;  Service: Cardiovascular;  Laterality: N/A;  mid LAD  . CARDIAC CATHETERIZATION N/A 03/09/2016   Procedure: Left Heart Cath and Coronary  Angiography;  Surgeon: Iran Ouch, MD;  Location: [facility] INVASIVE CV LAB;  Service: Cardiovascular;  Laterality: N/A;  . EMBOLECTOMY Right 02/21/2016   Procedure: EMBOLECTOMY RIGHT COMMON FEMORAL ARTERY; FOUR COMPARTMENT FASCIOTOMY;  Surgeon: Fransisco Hertz, MD;  Location: [facility] OR;  Service: Vascular;  Laterality: Right;  . FASCIOTOMY CLOSURE Right 02/26/2016   Procedure: RIGHT LATERAL LOWER LEG FASCIOTOMY CLOSURE;  Surgeon: Larina Earthly, MD;  Location: [facility] OR;  Service: Vascular;  Laterality: Right;  . HEMATOMA EVACUATION Right 02/22/2016   Procedure: EVACUATION HEMATOMA With Placement of Negative Pressure Dressing;  Surgeon: Fransisco Hertz, MD;  Location: [facility] OR;  Service: Vascular;  Laterality: Right;   Allergies  Allergen Reactions  . Metformin     Other reaction(s): Dizziness (intolerance)   Prior to Admission medications   Medication Sig Start Date End Date Taking? Authorizing Provider  atorvastatin (LIPITOR) 20 MG tablet TAKE 1 TABLET BY MOUTH ONCE DAILY 01/07/19  Yes Wendall Stade, MD  carvedilol (COREG) 6.25 MG tablet TAKE 1 TABLET BY MOUTH TWICE DAILY WITH MEALS. PLEASE KEEP UPCOMING APPT FOR FUTURE REFILLS 08/26/19  Yes Wendall Stade, MD  diclofenac Sodium (VOLTAREN) 1 % GEL Apply 2 g topically 4 (four) times daily. 04/24/20  Yes Burky, Dorene Grebe B, NP  glucose blood (ONETOUCH VERIO) test strip 1 strip by In Vitro route 2 times daily. Dx: E11.9 06/16/20  Yes Mardella Layman, MD  Semaglutide,0.25 or 0.5MG /DOS, (OZEMPIC, 0.25 OR 0.5 MG/DOSE,) 2 MG/1.5ML SOPN Inject 0.25 mg into the skin once a week. 07/06/20  Yes Shade Flood, MD   Social History   Socioeconomic History  . Marital status: Married    Spouse name: [name]  . Number of children: 5  . Years of education: COLLEGE  . Highest education level: Not on file  Occupational History  . Occupation: UNEMPLOYED  Tobacco Use  . Smoking status: Never Smoker  . Smokeless tobacco: Never Used  Vaping Use  . Vaping Use: Never used   Substance and Sexual Activity  . Alcohol use: No  . Drug use: No  . Sexual activity: Not on file  Other Topics Concern  . Not on file  Social History Narrative  . Not on file   Social Determinants of Health   Financial Resource Strain:   . Difficulty of Paying Living Expenses: Not on file  Food Insecurity:   . Worried About Programme researcher, broadcasting/film/video in the Last Year: Not on file  . Ran Out of Food in the Last Year: Not on file  Transportation Needs:   . Lack of Transportation (Medical): Not on file  . Lack of Transportation (Non-Medical): Not on file  Physical Activity:   . Days of Exercise per Week: Not on file  . Minutes of Exercise per Session: Not on file  Stress:   . Feeling of Stress :  Not on file  Social Connections:   . Frequency of Communication with Friends and Family: Not on file  . Frequency of Social Gatherings with Friends and Family: Not on file  . Attends Religious Services: Not on file  . Active Member of Clubs or Organizations: Not on file  . Attends Banker Meetings: Not on file  . Marital Status: Not on file  Intimate Partner Violence:   . Fear of Current or Ex-Partner: Not on file  . Emotionally Abused: Not on file  . Physically Abused: Not on file  . Sexually Abused: Not on file    Review of Systems Per HPI.   Objective:   Vitals:   07/31/20 1526  BP: 116/77  Pulse: 85  Temp: 98.4 F (36.9 C)  TempSrc: Temporal  SpO2: 98%  Weight: 211 lb (95.7 kg)  Height: 5\' 8"  (1.727 m)     Physical Exam Constitutional:      General: He is not in acute distress.    Appearance: He is well-developed.  HENT:     Head: Normocephalic and atraumatic.     Nose:     Comments: Sinuses nontender.  Small area at left septum with slight irritation, dried blood.  No active bleeding.  Right septum intact, minimal crust.  No active discharge or bleeding at this time.    Mouth/Throat:     Mouth: Mucous membranes are moist.     Pharynx: No  oropharyngeal exudate.     Comments: Few missing teeth, loose lower left molar and left upper molar without apparent surrounding gum erythema, edema, or discharge from base of tooth.  Slight tenderness along the gumline in the affected areas.  No facial swelling, no lymphadenopathy of neck appreciated.  Mandible, TMJ nontender.  Maxilla, sinuses nontender externally. Cardiovascular:     Rate and Rhythm: Normal rate.  Pulmonary:     Effort: Pulmonary effort is normal.  Neurological:     Mental Status: He is alert and oriented to person, place, and time.      34 minutes spent during visit, greater than 50% counseling and assimilation of information, chart review, and discussion of plan.    Assessment & Plan:  Monty AL Micki Riley is a 59 y.o. male . Type 2 diabetes mellitus with hyperglycemia, without long-term current use of insulin (HCC) - Plan: Semaglutide,0.25 or 0.5MG /DOS, (OZEMPIC, 0.25 OR 0.5 MG/DOSE,) 2 MG/1.5ML SOPN  -Decreased control, will change Ozempic to 0.5 mg dose per week.  Recheck A1c in approximately 2 months.  Loosening of tooth - Plan: HYDROcodone-acetaminophen (NORCO/VICODIN) 5-325 MG tablet Pain in gums - Plan: HYDROcodone-acetaminophen (NORCO/VICODIN) 5-325 MG tablet  -Few loose teeth as above but no apparent sign of abscess on exam.  Sensitivity with gum recession likely.  Did recommend urgent eval with his dentist next week if at all possible.  Few hydrocodone provided for dental pain if needed but again stressed importance of dental eval and urgent care/ER precautions given.  regarding epistaxis/ blood in nasal discharge.  Single episode, no true epistaxis, saline nasal spray discussed for now with RTC/ER precautions if returns or worsens.   Meds ordered this encounter  Medications  . Semaglutide,0.25 or 0.5MG /DOS, (OZEMPIC, 0.25 OR 0.5 MG/DOSE,) 2 MG/1.5ML SOPN    Sig: Inject 0.5 mg into the skin once a week.    Dispense:  3 mL    Refill:  3  .  HYDROcodone-acetaminophen (NORCO/VICODIN) 5-325 MG tablet    Sig: Take 1 tablet by mouth every  6 (six) hours as needed for moderate pain.    Dispense:  5 tablet    Refill:  0   Patient Instructions    Schedule eye doctor appointment Increase ozempic to 0.5mg  per week. Recheck A1c in 2 months.  Return to the clinic or go to the nearest emergency room if any of your symptoms worsen or new symptoms occur.  I do not see any obvious infection around the gums or teeth at this time but you did have a few loose teeth and are at risk for infection.  Please call your dentist for an appointment next week if at all possible.  I did write for a few stronger pain medications if needed, but ibuprofen is okay to use temporarily. Return to the clinic or go to the nearest emergency room if any of your symptoms worsen or new symptoms occur.  I do not see any active bleeding in your nose at this time, but there was a small raw area on the left inside of your nose that may have been the site from the previous bleeding. Try over-the-counter saline nasal spray a few times per day to help with the dry areas of irritated areas.  If bleeding returns, I would recommend a follow-up visit with visit with myself or urgent care or emergency room if you are unable to stop that bleeding.   Nosebleed, Adult A nosebleed is when blood comes out of the nose. Nosebleeds are common. Usually, they are not a sign of a serious condition. Nosebleeds can happen if a small blood vessel in your nose starts to bleed or if the lining of your nose (mucous membrane) cracks. They are commonly caused by:  Allergies.  Colds.  Picking your nose.  Blowing your nose too hard.  An injury from sticking an object into your nose or getting hit in the nose.  Dry or cold air. Less common causes of nosebleeds include:  Toxic fumes.  Something abnormal in the nose or in the air-filled spaces in the bones of the face (sinuses).  Growths in  the nose, such as polyps.  Medicines or conditions that cause blood to clot slowly.  Certain illnesses or procedures that irritate or dry out the nasal passages. Follow these instructions at home: When you have a nosebleed:   Sit down and tilt your head slightly forward.  Use a clean towel or tissue to pinch your nostrils under the bony part of your nose. After 10 minutes, let go of your nose and see if bleeding starts again. Do not release pressure before that time. If there is still bleeding, repeat the pinching and holding for 10 minutes until the bleeding stops.  Do not place tissues or gauze in the nose to stop bleeding.  Avoid lying down and avoid tilting your head backward. That may make blood collect in the throat and cause gagging or coughing.  Use a nasal spray decongestant to help with a nosebleed as told by your health care provider.  Do not use petroleum jelly or mineral oil in your nose. It can drip into your lungs. After a nosebleed:  Avoid blowing your nose or sniffing for a number of hours.  Avoid straining, lifting, or bending at the waist for several days. You may resume other normal activities as you are able.  Use saline spray or a humidifier as told by your health care provider.  Aspirinand blood thinners make bleeding more likely. If you are prescribed these medicines and you suffer  from nosebleeds: ? Ask your health care provider if you should stop taking the medicines or if you should adjust the dose. ? Do not stop taking medicines that your health care provider has recommended unless told by your health care provider.  If your nosebleed was caused by dry mucous membranes, use over-the-counter saline nasal spray or gel. This will keep the mucous membranes moist and allow them to heal. If you must use a lubricant: ? Choose one that is water-soluble. ? Use only as much as you need and use it only as often as needed. ? Do not lie down until several hours  after you use it. Contact a health care provider if:  You have a fever.  You get nosebleeds often or more often than usual.  You bruise very easily.  You have a nosebleed from having something stuck in your nose.  You have bleeding in your mouth.  You vomit or cough up brown material.  You have a nosebleed after you start a new medicine. Get help right away if:  You have a nosebleed after a fall or a head injury.  Your nosebleed does not go away after 20 minutes.  You feel dizzy or weak.  You have unusual bleeding from other parts of your body.  You have unusual bruising on other parts of your body.  You become sweaty.  You vomit blood. This information is not intended to replace advice given to you by your health care provider. Make sure you discuss any questions you have with your health care provider. Document Revised: 11/14/2017 Document Reviewed: 03/01/2016 Elsevier Patient Education  2020 Elsevier Inc.   Dental Pain Dental pain may be caused by many things, including:  Tooth decay (cavities or caries). Cavities expose the nerve of your tooth to air and to hot or cold temperatures. This can cause pain or discomfort.  Abscess or infection. A dental abscess is a collection of pus from a bacterial infection in the inner part of the tooth (pulp). It usually occurs at the end of the root of a tooth.  Injury.  An unknown reason (idiopathic). Your pain may be mild or severe. It may occur when you are:  Chewing.  Exposed to hot or cold temperatures.  Eating or drinking sugary foods or beverages, such as soda or candy. Your pain may be constant, or it may come and go without cause. Follow these instructions at home:  Watch your dental pain for any changes. The following actions may help to lessen any discomfort that you are feeling: Medicines  Take over-the-counter and prescription medicines only as told by your health care provider.  If you were prescribed  an antibiotic medicine, take it as told by your health care provider. Do not stop taking the antibiotic even if you start to feel better. Eating and drinking  Avoid foods or drinks that cause you pain, such as: ? Very hot or very cold foods or drinks. ? Sweet or sugary foods or drinks. Managing pain and swelling  Apply ice to the painful area of your face: ? Put ice in a plastic bag. ? Place a towel between your skin and the bag. ? Leave the ice on for 20 minutes, 2-3 times a day. Brushing your teeth  To keep your mouth and gums healthy, use fluoride toothpaste to brush your teeth twice a day. Floss once a day.  Use a toothpaste made for sensitive teeth if directed by your health care provider.  Brush your  teeth with a soft-bristled toothbrush. General instructions  Do not apply heat to the outside of your face.  Gargle with a salt-water mixture 3-4 times a day or as needed. To make a salt-water mixture, completely dissolve -1 tsp of salt in 1 cup of warm water.  Keep all follow-up visits as told by your health care provider. This is important.  Apply ice to the outside of your jaw if there is swelling. Do not put ice directly on the skin. Contact a health care provider if:  Your pain is not controlled with medicines.  Your symptoms get worse.  You have new symptoms. Get help right away if you:  Are unable to open your mouth.  Are having trouble breathing or swallowing.  Have a fever.  Notice that your face, neck, or jaw is swollen. Summary  Dental pain may be caused by many things, including tooth decay and infection.  Your pain may be mild or severe.  Take over-the-counter and prescription medicines only as told by your health care provider.  Watch your dental pain for any changes. Let your health care provider know if symptoms get worse. This information is not intended to replace advice given to you by your health care provider. Make sure you discuss any  questions you have with your health care provider. Document Revised: 12/11/2018 Document Reviewed: 07/06/2017 Elsevier Patient Education  [employer].     If you have lab work done today you will be contacted with your lab results within the next 2 weeks.  If you have not heard from [place] then please contact us. The fastest way to get your results is to register for My Chart.   IF you received an x-ray today, you will receive an invoice from [facility]. Please contact [facility] at [phone] with questions or concerns regarding your invoice.   IF you received labwork today, you will receive an invoice from [facility]. Please contact [facility] at [phone] with questions or concerns regarding your invoice.   Our billing staff will not be able to assist you with questions regarding bills from these companies.  You will be contacted with the lab results as soon as they are available. The fastest way to get your results is to activate your My Chart account. Instructions are located on the last page of this paperwork. If you have not heard from [place] regarding the results in 2 weeks, please contact this office.         Signed, Meredith Staggers, MD [facility]

## 2020-08-01 MED FILL — OZEMPIC 0.25 OR 0.5 MG/DOSE: 2 | 28 days supply | Qty: 2 | Fill #0

## 2020-08-03 ENCOUNTER — Ambulatory Visit: Payer: 59 | Admitting: Cardiovascular Disease

## 2020-08-14 ENCOUNTER — Telehealth: Payer: 59 | Admitting: Cardiovascular Disease

## 2020-08-27 ENCOUNTER — Other Ambulatory Visit (HOSPITAL_COMMUNITY): Payer: Self-pay | Admitting: Cardiovascular Disease

## 2020-08-27 ENCOUNTER — Other Ambulatory Visit: Payer: Self-pay | Admitting: Cardiovascular Disease

## 2020-08-27 ENCOUNTER — Other Ambulatory Visit: Payer: Self-pay

## 2020-08-27 MED ORDER — CARVEDILOL 6.25 MG PO TABS
6.2500 mg | ORAL_TABLET | Freq: Two times a day (BID) | ORAL | 0 refills | Status: DC
Start: 2020-08-27 — End: 2021-12-30

## 2020-08-27 MED FILL — ATORVASTATIN CALCIUM 20 MG: 20 | 30 days supply | Qty: 30 | Fill #0

## 2020-08-27 MED FILL — OZEMPIC 0.25 OR 0.5 MG/DOSE: 2 | 28 days supply | Qty: 2 | Fill #1

## 2020-08-27 MED FILL — CARVEDILOL 6.25 MG TABLET: 6.25 | 30 days supply | Qty: 60 | Fill #0

## 2020-09-26 ENCOUNTER — Other Ambulatory Visit: Payer: Self-pay | Admitting: Cardiovascular Disease

## 2020-09-28 ENCOUNTER — Other Ambulatory Visit (HOSPITAL_COMMUNITY): Payer: Self-pay | Admitting: Cardiovascular Disease

## 2020-09-30 MED FILL — ATORVASTATIN CALCIUM 20 MG: 20 | 15 days supply | Qty: 15 | Fill #0

## 2020-09-30 MED FILL — OZEMPIC 0.25 OR 0.5 MG/DOSE: 2 | 84 days supply | Qty: 5 | Fill #2

## 2020-10-01 ENCOUNTER — Other Ambulatory Visit: Payer: Self-pay

## 2020-10-01 ENCOUNTER — Ambulatory Visit (INDEPENDENT_AMBULATORY_CARE_PROVIDER_SITE_OTHER): Payer: 59 | Admitting: Family Medicine

## 2020-10-01 ENCOUNTER — Encounter: Payer: Self-pay | Admitting: Family Medicine

## 2020-10-01 ENCOUNTER — Ambulatory Visit (HOSPITAL_COMMUNITY)
Admission: RE | Admit: 2020-10-01 | Discharge: 2020-10-01 | Disposition: A | Discharge status: Home / Self Care | Payer: 59 | Source: Ambulatory Visit | Attending: Family Medicine | Admitting: Family Medicine

## 2020-10-01 ENCOUNTER — Encounter (HOSPITAL_COMMUNITY): Payer: Self-pay

## 2020-10-01 ENCOUNTER — Other Ambulatory Visit (HOSPITAL_COMMUNITY): Payer: Self-pay | Admitting: Family Medicine

## 2020-10-01 VITALS — BP 126/84 | HR 81 | Temp 98.3°F | Ht 68.0 in | Wt 211.0 lb

## 2020-10-01 DIAGNOSIS — R519 Headache, unspecified: Secondary | ICD-10-CM | POA: Diagnosis present

## 2020-10-01 DIAGNOSIS — K1379 Other lesions of oral mucosa: Secondary | ICD-10-CM | POA: Diagnosis not present

## 2020-10-01 DIAGNOSIS — K029 Dental caries, unspecified: Secondary | ICD-10-CM | POA: Diagnosis not present

## 2020-10-01 DIAGNOSIS — E1165 Type 2 diabetes mellitus with hyperglycemia: Secondary | ICD-10-CM

## 2020-10-01 MED ORDER — HYDROCODONE-ACETAMINOPHEN 5-325 MG PO TABS: 1.0000 | ORAL_TABLET | Freq: Four times a day (QID) | ORAL | 0 refills | Status: DC | PRN

## 2020-10-01 MED ORDER — OZEMPIC (0.25 OR 0.5 MG/DOSE) 2 MG/1.5ML ~~LOC~~ SOPN: 0.5000 mg | PEN_INJECTOR | SUBCUTANEOUS | 3 refills | Status: DC

## 2020-10-01 MED ORDER — AMOXICILLIN-POT CLAVULANATE 875-125 MG PO TABS: 1.0000 | ORAL_TABLET | Freq: Two times a day (BID) | ORAL | 0 refills | Status: DC

## 2020-10-01 MED FILL — HYDROCODON-APAP 5-325: 5-325 | 5 days supply | Qty: 20 | Fill #0

## 2020-10-01 MED FILL — AMOX TR-K CLV 875-125 MG TA: 875-125 | 10 days supply | Qty: 20 | Fill #0

## 2020-10-01 NOTE — Patient Instructions (Addendum)
Call and get appointment with eye doctor.  Start antibiotic twice per day, hydrocodone if needed for dental pain/headache but I will check some other labs and CT scan to make sure nothing more concerning.  Keep follow-up with dentist for now as long as those tests are reassuring.  If any progression or worsening symptoms including fevers, difficulty moving jaw, trouble swallowing or other changes be seen in the emergency room   Type 2 Diabetes Mellitus, Self-Care, Adult Caring for yourself after you have been diagnosed with type 2 diabetes (type 2 diabetes mellitus) means keeping your blood sugar (glucose) under control with a balance of:  Nutrition.  Exercise.  Lifestyle changes.  Medicines or insulin, if needed.  Support from your team of health care providers and others. The following information explains what you need to know to manage your diabetes at home. What are the risks? Having diabetes can put you at risk for other long-term (chronic) conditions, such as heart disease and kidney disease. Your health care provider may prescribe medicines to help prevent complications from diabetes. How to monitor blood glucose  Check your blood glucose every day, as often as told by your health care provider.  Have your A1C (hemoglobin A1C) level checked two or more times a year, or as often as told by your health care provider.  Your health care provider will set personalized treatment goals for you. Generally, the goal of treatment is to maintain the following blood glucose levels: ? Before meals: 80-130 mg/dL (4.4-7.2 mmol/L). ? After meals: below 180 mg/dL (10 mmol/L). ? A1C level: less than 7%.   How to manage hyperglycemia and hypoglycemia Hyperglycemia symptoms Hyperglycemia, also called high blood glucose, occurs when blood glucose is too high. Make sure you know the early signs of hyperglycemia, such as:  Increased thirst.  Hunger.  Feeling very tired.  Needing to urinate  more often than usual.  Blurry vision. Hypoglycemia symptoms Hypoglycemia, also called low blood glucose, occurs with a blood glucose level at or below 70 mg/dL (3.9 mmol/L). Diabetes medicines lower your blood glucose and can cause hypoglycemia. The risk for hypoglycemia increases during or after exercise, during sleep, during illness, and when skipping meals or not eating for a long time (fasting). It is important to know the symptoms of hypoglycemia and treat it right away. Always have a 15-gram rapid-acting carbohydrate snack with you to treat low blood glucose. Family members and close friends should also know the symptoms and understand how to treat hypoglycemia, in case you are not able to treat yourself. Symptoms may include:  Hunger.  Anxiety.  Sweating and feeling clammy.  Dizziness or feeling light-headed.  Sleepiness.  Increased heart rate.  Irritability.  Tingling or numbness around the mouth, lips, or tongue.  Restless sleep. Severe hypoglycemia is when your blood glucose level is at or below 54 mg/dL (3 mmol/L). Severe hypoglycemia is an emergency. Do not wait to see if the symptoms will go away. Get medical help right away. Call your local emergency services (911 in the [place] Do not drive yourself to the hospital. If you have severe hypoglycemia and you cannot eat or drink, you may need glucagon. A family member or close friend should learn how to check your blood glucose and how to give you glucagon. Ask your health care provider if you need to have an emergency glucagon kit available. Follow these instructions at home: Medicines  Take diabetes medicines as told by your health care provider. If your health care  provider prescribed insulin or diabetes medicines, take them every day.  Do not run out of insulin or other diabetes medicines. Plan ahead so you always have these available.  If you use insulin, adjust your dosage based on your physical activity and what  foods you eat. Your health care provider will tell you how to adjust your dosage.  Take over-the-counter and prescription medicines only as told by your health care provider. Eating and drinking What you eat and drink affects your blood glucose and your insulin dosage. Making good choices helps to control your diabetes and prevent other health problems. A healthy meal plan includes eating lean proteins, complex carbohydrates, fresh fruits and vegetables, low-fat dairy products, and healthy fats. Make an appointment to see a registered dietitian to help you create an eating plan that is right for you. Make sure that you:  Follow instructions from your health care provider about eating or drinking restrictions.  Drink enough fluid to keep your urine pale yellow.  Keep a record of the carbohydrates that you eat. Do this by reading food labels and learning the standard serving sizes of foods.  Follow your sick-day plan whenever you cannot eat or drink as usual. Make this plan in advance with your health care provider.   Activity  Stay active. Exercise regularly, as told by your health care provider. This may include: ? Stretching and doing strength exercises, such as yoga or weight lifting, 2 or more times a week. ? Doing 150 minutes or more of moderate-intensity or vigorous-intensity exercise each week. This could be brisk walking, biking, or water aerobics.  Spread out your activity over 3 or more days of the week.  Do not go more than 2 days in a row without doing some kind of physical activity.  When you start a new exercise or activity, work with your health care provider to adjust your insulin, medicines, or food intake as needed. Lifestyle  Do not use any products that contain nicotine or tobacco, such as cigarettes, e-cigarettes, and chewing tobacco. If you need help quitting, ask your health care provider.  If your health care provider says that alcohol is safe for you, limit how  much you use to no more than 1 drink a day for women who are not pregnant and 2 drinks a day for men. In the [place] one drink equals one 12 oz bottle of beer (355 mL), one 5 oz glass of wine (148 mL), or one 1 oz glass of hard liquor (44 mL).  Learn to manage stress. If you need help with this, ask your health care provider. Take care of your body  Keep your immunizations up to date. In addition to getting vaccinations as told by your health care provider, it is recommended that you get vaccinated against the following illnesses: ? The flu (influenza). Get a flu shot every year. ? Pneumonia. ? Hepatitis B.  Schedule an eye exam soon after your diagnosis, and then one time every year after that.  Check your skin and feet every day for cuts, bruises, redness, blisters, or sores. Schedule a foot exam with your health care provider once every year.  Brush your teeth and gums two times a day, and floss one or more times a day. Visit your dentist one or more times every 6 months.  Maintain a healthy weight.   General instructions  Share your diabetes management plan with people in your workplace, school, and household.  Carry a medical alert card or  wear medical alert jewelry.  Keep all follow-up visits as told by your health care provider. This is important. Questions to ask your health care provider  Should I meet with a certified diabetes care and education specialist?  Where can I find a support group for people with diabetes? Where to find more information  American Diabetes Association (ADA): www.diabetes.org  American Association of Diabetes Care and Education Specialists (ADCES): www.diabeteseducator.org  International Diabetes Federation (IDF): MemberVerification.ca Summary  Caring for yourself after you have been diagnosed with type 2 diabetes (type 2 diabetes mellitus) means keeping your blood sugar (glucose) under control with a balance of nutrition, exercise, lifestyle changes, and  medicine.  Check your blood glucose every day, as often as told by your health care provider.  Having diabetes can put you at risk for other long-term (chronic) conditions, such as heart disease and kidney disease. Your health care provider may prescribe medicines to help prevent complications from diabetes.  Share your diabetes management plan with people in your workplace, school, and household.  Keep all follow-up visits as told by your health care provider. This is important. This information is not intended to replace advice given to you by your health care provider. Make sure you discuss any questions you have with your health care provider. Document Revised: 09/23/2019 Document Reviewed: 09/24/2019 Elsevier Patient Education  2021 [employer].   If you have lab work done today you will be contacted with your lab results within the next 2 weeks.  If you have not heard from [place] then please contact us. The fastest way to get your results is to register for My Chart.   IF you received an x-ray today, you will receive an invoice from [facility]. Please contact [facility] at [phone] with questions or concerns regarding your invoice.   IF you received labwork today, you will receive an invoice from [place]. Please contact [facility] at [phone] with questions or concerns regarding your invoice.   Our billing staff will not be able to assist you with questions regarding bills from these companies.  You will be contacted with the lab results as soon as they are available. The fastest way to get your results is to activate your My Chart account. Instructions are located on the last page of this paperwork. If you have not heard from [place] regarding the results in 2 weeks, please contact this office.

## 2020-10-01 NOTE — Progress Notes (Signed)
Subjective:  Patient ID: Troy Yu, male    DOB: 29-Dec-1960  Age: 60 y.o. MRN: [id number]  CC:  Chief Complaint  Patient presents with  . Follow-up    On diabetes.PT reports 2 weeks ago pt had a high BS of 229m/dL, but pt states it has come down since then to around137 mg/dL. PT states the high BS was the last day before his next dose of his weekly Ozempic injection.   . Medication Refill    Refills on Ozempic and hydrocodone. PT is requesting a refill of the pain medication to get him to his dental appt on 10/12/2020 for his dental pain. PT reports tylenol does noting for the pain. PT reports being worried about an infection in his gums as well.    HPI [name] for   Diabetes: With hyperglycemia.  Treated with Ozempic.  Unable to tolerate Metformin 0 felt dizzy.  Still decreased control based on home readings at last visit in December, change Ozempic to 0.5 mg weekly. No n/v/abd pain.  Home readings 137-207, high reading the day before his repeat dose of Ozempic.  No symptomatic lows. Not checking daily.  Microalbumin: Normal ratio 07/07/2020 Optho, foot exam, pneumovax: Due for ophthalmology exam - due for appt - needs to schedule.  COVID-19 vaccine: did not receive, not planning on getting it.    Lab Results  Component Value Date   HGBA1C 8.1 (H) 07/07/2020   HGBA1C 5.9 (H) 06/01/2016   HGBA1C 9.2 (H) 02/23/2016   Lab Results  Component Value Date   LDLCALC 82 07/07/2020   CREATININE 1.10 07/07/2020    Dental pain Discussed at December 3 visit.  Pain in his upper gums at that time for a few weeks, sore to eat.  No loss of teeth but some movement, no discharge.  Has an appointment with dentist February 14.  Temporary prescription of hydrocodone provided at last visit.  Recommended urgent eval with dentist within the next 1 week when discussed in December.  Was treated by dentist in December, started on antibiotics 10 days, plan for follow up  in 11 days.  Past 2 weeks more left side face pain No discharge from gums, blood at times in am in gums in upper and lower left.  Left face pain more than a week. Pain moves up to left temple, spasm feeling of left temple blood vessel. Pain behind left eye and around. No darkening of vision, no vision changes. Left temporal headache past 2 weeks. Feels like getting worse.  Brief relief with 8015mibuprofen.  No fever.  No nasal discharge.  No jaw pain.      History Patient Active Problem List   Diagnosis Date Noted  . History of fasciotomy 06/07/2016  . Status post coronary artery stent placement   . Acute combined systolic and diastolic heart failure ([facility]  . Abnormal CXR   . Abdominal pain   . Chest pain   . NSTEMI (non-ST elevated myocardial infarction) ([facility]  . Fever   . Thromboembolism ([facility]  . Vascular occlusion   . Pain   . Anterior subendocardial MI (HCMat[place]06/25/2017  . Right leg pain 02/21/2016  . Non-STEMI (non-ST elevated myocardial infarction) (HCR[place]06/25/2017  . LV (left ventricular) mural thrombus 02/21/2016  . Elevated troponin 02/21/2016  . Abnormal EKG 02/21/2016  . Acute MI ([facility]CS[place]06/25/2017  . History of embolectomy 02/21/2016  . [facility]   Past Medical History:  Diagnosis Date  . Diabetes ([place])   . Hypercholesteremia   . LV (left ventricular) mural thrombus   . NSTEMI (non-ST elevated myocardial infarction) [facility])    Past Surgical History:  Procedure Laterality Date  . APPLICATION OF WOUND VAC Right 02/22/2016   Procedure: APPLICATION OF Negative Pressure Dressing Right Lower Leg;  Surgeon: Conrad [place], MD;  Location: [place];  Service: Vascular;  Laterality: Right;  . APPLICATION OF WOUND VAC Right 02/26/2016   Procedure: APPLICATION OF WOUND VAC, RIGHT MEDIAL LOWER LEG FASCIOTOMY SITE;  Surgeon: Rosetta Posner, MD;  Location: [place];  Service: Vascular;  Laterality: Right;  . CARDIAC CATHETERIZATION N/A 03/09/2016   Procedure: Coronary Stent  Intervention;  Surgeon: Wellington Hampshire, MD;  Location: [place] CV LAB;  Service: Cardiovascular;  Laterality: N/A;  distal RCA mid LAD  . CARDIAC CATHETERIZATION N/A 03/09/2016   Procedure: Intravascular Pressure Wire/FFR Study;  Surgeon: Wellington Hampshire, MD;  Location: [place] CV LAB;  Service: Cardiovascular;  Laterality: N/A;  mid LAD  . CARDIAC CATHETERIZATION N/A 03/09/2016   Procedure: Left Heart Cath and Coronary Angiography;  Surgeon: Wellington Hampshire, MD;  Location: [place] CV [facility];  Service: Cardiovascular;  Laterality: N/A;  . EMBOLECTOMY Right 02/21/2016   Procedure: EMBOLECTOMY RIGHT COMMON FEMORAL ARTERY; FOUR COMPARTMENT FASCIOTOMY;  Surgeon: Conrad Marianna, MD;  Location: [place];  Service: Vascular;  Laterality: Right;  . FASCIOTOMY CLOSURE Right 02/26/2016   Procedure: RIGHT LATERAL LOWER LEG FASCIOTOMY CLOSURE;  Surgeon: Rosetta Posner, MD;  Location: [place];  Service: Vascular;  Laterality: Right;  . HEMATOMA EVACUATION Right 02/22/2016   Procedure: EVACUATION HEMATOMA With Placement of Negative Pressure Dressing;  Surgeon: Conrad [place], MD;  Location: [place];  Service: Vascular;  Laterality: Right;   Allergies  Allergen Reactions  . Metformin     Other reaction(s): Dizziness (intolerance)   Prior to Admission medications   Medication Sig Start Date End Date Taking? Authorizing Provider  atorvastatin (LIPITOR) 20 MG tablet Take 1 tablet (20 mg total) by mouth daily. Please make overdue appt with Dr. Johnsie Cancel before anymore refills. Thank you 3rd and Final Attempt 09/28/20   Josue Hector, MD  carvedilol (COREG) 6.25 MG tablet Take 1 tablet (6.25 mg total) by mouth 2 (two) times daily with a meal. Please schedule appt for future refills. 1 attempt 08/27/20   Josue Hector, MD  diclofenac Sodium (VOLTAREN) 1 % GEL Apply 2 g topically 4 (four) times daily. 04/24/20   Augusto Gamble B, NP  glucose blood (ONETOUCH VERIO) test strip 1 strip by In Vitro route 2 times daily. Dx:  E11.9 06/16/20   Vanessa Kick, MD  HYDROcodone-acetaminophen (NORCO/VICODIN) 5-325 MG tablet Take 1 tablet by mouth every 6 (six) hours as needed for moderate pain. 07/31/20   Wendie Agreste, MD  Semaglutide,0.25 or 0.5MG/DOS, (OZEMPIC, 0.25 OR 0.5 MG/DOSE,) 2 MG/1.5ML SOPN Inject 0.5 mg into the skin once a week. 07/31/20   Wendie Agreste, MD   Social History   Socioeconomic History  . Marital status: Married    Spouse name: [name]  . Number of children: 5  . Years of education: COLLEGE  . Highest education level: Not on file  Occupational History  . Occupation: UNEMPLOYED  Tobacco Use  . Smoking status: Never Smoker  . Smokeless tobacco: Never Used  Vaping Use  . Vaping Use: Never used  Substance and Sexual Activity  . Alcohol use: No  .  Drug use: No  . Sexual activity: Not on file  Other Topics Concern  . Not on file  Social History Narrative  . Not on file   Social Determinants of Health   Financial Resource Strain: Not on file  Food Insecurity: Not on file  Transportation Needs: Not on file  Physical Activity: Not on file  Stress: Not on file  Social Connections: Not on file  Intimate Partner Violence: Not on file    Review of Systems  Per HPI.  Objective:   Vitals:   10/01/20 1521  BP: 126/84  Pulse: 81  Temp: 98.3 F (36.8 C)  TempSrc: Temporal  SpO2: 98%  Weight: 211 lb (95.7 kg)  Height: _0  (1.727 m)     Physical Exam Vitals reviewed.  Constitutional:      Appearance: He is well-developed and well-nourished.  HENT:     Head: Normocephalic and atraumatic.     Mouth/Throat:     Comments: Dental decay wiith missing teeth multiple areas, upper left with loose molar, tender to palpation at lingual and buccal gumline without expression of blood or exudate.  No appreciable swelling of area.  Slight erythema, possible slight soft tissue swelling at the buccal mucosa lateral to that tooth.  No other appreciable loose teeth or tenderness of teeth  upper.   Frontal, maxillary sinus nontender.  Slight discomfort over the left temporal scalp, no cords palpated  Eyes:     Extraocular Movements: EOM normal.     Pupils: Pupils are equal, round, and reactive to light.  Neck:     Vascular: No carotid bruit or JVD.  Cardiovascular:     Rate and Rhythm: Normal rate and regular rhythm.     Heart sounds: Normal heart sounds. No murmur heard.   Pulmonary:     Effort: Pulmonary effort is normal.     Breath sounds: Normal breath sounds. No rales.  Musculoskeletal:        General: No edema.  Lymphadenopathy:     Cervical: No cervical adenopathy.  Skin:    General: Skin is warm and dry.  Neurological:     Mental Status: He is alert and oriented to person, place, and time.  Psychiatric:        Mood and Affect: Mood and affect normal.        Assessment & Plan:  Troy Yu is a 60 y.o. male . Type 2 diabetes mellitus with hyperglycemia, without long-term current use of insulin (HCC) - Plan: Hemoglobin X4J, Basic metabolic panel, OINOMVEHMCN,4.70 or 0.5MG/DOS, (OZEMPIC, 0.25 OR 0.5 MG/DOSE,) 2 MG/1.5ML SOPN, DISCONTINUED: Semaglutide,0.25 or 0.5MG/DOS, (OZEMPIC, 0.25 OR 0.5 MG/DOSE,) 2 MG/1.5ML SOPN  -Some variability in readings, tolerating Ozempic.  Check A1c, then further dosage adjustments if needed, check BMP.  Dental decay Mouth pain Left temporal headache  Left-sided face pain - Plan: CBC, Sedimentation Rate, CT Maxillofacial WO CM, amoxicillin-clavulanate (AUGMENTIN) 875-125 MG tablet, HYDROcodone-acetaminophen (NORCO/VICODIN) 5-325 MG tablet, DISCONTINUED: amoxicillin-clavulanate (AUGMENTIN) 875-125 MG tablet, DISCONTINUED: HYDROcodone-acetaminophen (NORCO/VICODIN) 5-325 MG tablet, CANCELED: CT Maxillofacial WO CM  -History of dental decay with loose molar left upper.  Some surrounding discomfort without apparent abscess but now with progressive left-sided face pain, sinus pain, periorbital pain, left temporal headache.   Differential includes deep space infection or abscess.  Temporal headache without vision changes or amaurosis fugax.  -Start Augmentin, hydrocodone if needed for pain, potential side effects discussed previously.  -CT maxillofacial ordered to rule out abscess, ER precautions if acute worsening.  -  Keep follow-up with dentist, but may need dental eval sooner depending on imaging.  Meds ordered this encounter  Medications  . DISCONTD: Semaglutide,0.25 or 0.5MG/DOS, (OZEMPIC, 0.25 OR 0.5 MG/DOSE,) 2 MG/1.5ML SOPN    Sig: Inject 0.5 mg into the skin once a week.    Dispense:  3 mL    Refill:  3  . DISCONTD: amoxicillin-clavulanate (AUGMENTIN) 875-125 MG tablet    Sig: Take 1 tablet by mouth 2 (two) times daily.    Dispense:  20 tablet    Refill:  0  . DISCONTD: HYDROcodone-acetaminophen (NORCO/VICODIN) 5-325 MG tablet    Sig: Take 1 tablet by mouth every 6 (six) hours as needed for moderate pain.    Dispense:  20 tablet    Refill:  0  . amoxicillin-clavulanate (AUGMENTIN) 875-125 MG tablet    Sig: Take 1 tablet by mouth 2 (two) times daily.    Dispense:  20 tablet    Refill:  0  . HYDROcodone-acetaminophen (NORCO/VICODIN) 5-325 MG tablet    Sig: Take 1 tablet by mouth every 6 (six) hours as needed for moderate pain.    Dispense:  20 tablet    Refill:  0  . Semaglutide,0.25 or 0.5MG/DOS, (OZEMPIC, 0.25 OR 0.5 MG/DOSE,) 2 MG/1.5ML SOPN    Sig: Inject 0.5 mg into the skin once a week.    Dispense:  3 mL    Refill:  3   Patient Instructions   Call and get appointment with eye doctor.  Start antibiotic twice per day, hydrocodone if needed for dental pain/headache but I will check some other labs and CT scan to make sure nothing more concerning.  Keep follow-up with dentist for now as long as those tests are reassuring.  If any progression or worsening symptoms including fevers, difficulty moving jaw, trouble swallowing or other changes be seen in the emergency room   Type 2 Diabetes  [place], Self-Care, Adult Caring for yourself after you have been diagnosed with type 2 diabetes (type 2 diabetes mellitus) means keeping your blood sugar (glucose) under control with a balance of:  Nutrition.  Exercise.  Lifestyle changes.  Medicines or insulin, if needed.  Support from your team of health care providers and others. The following information explains what you need to know to manage your diabetes at home. What are the risks? Having diabetes can put you at risk for other long-term (chronic) conditions, such as heart disease and kidney disease. Your health care provider may prescribe medicines to help prevent complications from diabetes. How to monitor blood glucose  Check your blood glucose every day, as often as told by your health care provider.  Have your A1C (hemoglobin A1C) level checked two or more times a year, or as often as told by your health care provider.  Your health care provider will set personalized treatment goals for you. Generally, the goal of treatment is to maintain the following blood glucose levels: ? Before meals: 80-130 mg/dL (4.4-7.2 mmol/L). ? After meals: below 180 mg/dL (10 mmol/L). ? A1C level: less than 7%.   How to manage hyperglycemia and hypoglycemia Hyperglycemia symptoms Hyperglycemia, also called high blood glucose, occurs when blood glucose is too high. Make sure you know the early signs of hyperglycemia, such as:  Increased thirst.  Hunger.  Feeling very tired.  Needing to urinate more often than usual.  Blurry vision. Hypoglycemia symptoms Hypoglycemia, also called low blood glucose, occurs with a blood glucose level at or below 70 mg/dL (3.9 mmol/L). Diabetes  medicines lower your blood glucose and can cause hypoglycemia. The risk for hypoglycemia increases during or after exercise, during sleep, during illness, and when skipping meals or not eating for a long time (fasting). It is important to know the symptoms of  hypoglycemia and treat it right away. Always have a 15-gram rapid-acting carbohydrate snack with you to treat low blood glucose. Family members and close friends should also know the symptoms and understand how to treat hypoglycemia, in case you are not able to treat yourself. Symptoms may include:  Hunger.  Anxiety.  Sweating and feeling clammy.  Dizziness or feeling light-headed.  Sleepiness.  Increased heart rate.  Irritability.  Tingling or numbness around the mouth, lips, or tongue.  Restless sleep. Severe hypoglycemia is when your blood glucose level is at or below 54 mg/dL (3 mmol/L). Severe hypoglycemia is an emergency. Do not wait to see if the symptoms will go away. Get medical help right away. Call your local emergency services (911 in the [place] Do not drive yourself to the hospital. If you have severe hypoglycemia and you cannot eat or drink, you may need glucagon. A family member or close friend should learn how to check your blood glucose and how to give you glucagon. Ask your health care provider if you need to have an emergency glucagon kit available. Follow these instructions at home: Medicines  Take diabetes medicines as told by your health care provider. If your health care provider prescribed insulin or diabetes medicines, take them every day.  Do not run out of insulin or other diabetes medicines. Plan ahead so you always have these available.  If you use insulin, adjust your dosage based on your physical activity and what foods you eat. Your health care provider will tell you how to adjust your dosage.  Take over-the-counter and prescription medicines only as told by your health care provider. Eating and drinking What you eat and drink affects your blood glucose and your insulin dosage. Making good choices helps to control your diabetes and prevent other health problems. A healthy meal plan includes eating lean proteins, complex carbohydrates, fresh fruits and  vegetables, low-fat dairy products, and healthy fats. Make an appointment to see a registered dietitian to help you create an eating plan that is right for you. Make sure that you:  Follow instructions from your health care provider about eating or drinking restrictions.  Drink enough fluid to keep your urine pale yellow.  Keep a record of the carbohydrates that you eat. Do this by reading food labels and learning the standard serving sizes of foods.  Follow your sick-day plan whenever you cannot eat or drink as usual. Make this plan in advance with your health care provider.   Activity  Stay active. Exercise regularly, as told by your health care provider. This may include: ? Stretching and doing strength exercises, such as yoga or weight lifting, 2 or more times a week. ? Doing 150 minutes or more of moderate-intensity or vigorous-intensity exercise each week. This could be brisk walking, biking, or water aerobics.  Spread out your activity over 3 or more days of the week.  Do not go more than 2 days in a row without doing some kind of physical activity.  When you start a new exercise or activity, work with your health care provider to adjust your insulin, medicines, or food intake as needed. Lifestyle  Do not use any products that contain nicotine or tobacco, such as cigarettes, e-cigarettes, and chewing tobacco.  If you need help quitting, ask your health care provider.  If your health care provider says that alcohol is safe for you, limit how much you use to no more than 1 drink a day for women who are not pregnant and 2 drinks a day for men. In the [place] one drink equals one 12 oz bottle of beer (355 mL), one 5 oz glass of wine (148 mL), or one 1 oz glass of hard liquor (44 mL).  Learn to manage stress. If you need help with this, ask your health care provider. Take care of your body  Keep your immunizations up to date. In addition to getting vaccinations as told by your health  care provider, it is recommended that you get vaccinated against the following illnesses: ? The flu (influenza). Get a flu shot every year. ? Pneumonia. ? Hepatitis B.  Schedule an eye exam soon after your diagnosis, and then one time every year after that.  Check your skin and feet every day for cuts, bruises, redness, blisters, or sores. Schedule a foot exam with your health care provider once every year.  Brush your teeth and gums two times a day, and floss one or more times a day. Visit your dentist one or more times every 6 months.  Maintain a healthy weight.   General instructions  Share your diabetes management plan with people in your workplace, school, and household.  Carry a medical alert card or wear medical alert jewelry.  Keep all follow-up visits as told by your health care provider. This is important. Questions to ask your health care provider  Should I meet with a certified diabetes care and education specialist?  Where can I find a support group for people with diabetes? Where to find more information  American Diabetes Association (ADA): www.diabetes.org  American Association of Diabetes Care and Education Specialists (ADCES): www.diabeteseducator.org  International Diabetes Federation (IDF): MemberVerification.ca Summary  Caring for yourself after you have been diagnosed with type 2 diabetes (type 2 diabetes mellitus) means keeping your blood sugar (glucose) under control with a balance of nutrition, exercise, lifestyle changes, and medicine.  Check your blood glucose every day, as often as told by your health care provider.  Having diabetes can put you at risk for other long-term (chronic) conditions, such as heart disease and kidney disease. Your health care provider may prescribe medicines to help prevent complications from diabetes.  Share your diabetes management plan with people in your workplace, school, and household.  Keep all follow-up visits as told by  your health care provider. This is important. This information is not intended to replace advice given to you by your health care provider. Make sure you discuss any questions you have with your health care provider. Document Revised: 09/23/2019 Document Reviewed: 09/24/2019 Elsevier Patient Education  2021 [employer].   If you have lab work done today you will be contacted with your lab results within the next 2 weeks.  If you have not heard from [place] then please contact us. The fastest way to get your results is to register for My Chart.   IF you received an x-ray today, you will receive an invoice from [facility]. Please contact [facility] at [phone] with questions or concerns regarding your invoice.   IF you received labwork today, you will receive an invoice from [facility]. Please contact [facility] at [phone] with questions or concerns regarding your invoice.   Our billing staff will not be able to assist you with  questions regarding bills from these companies.  You will be contacted with the lab results as soon as they are available. The fastest way to get your results is to activate your My Chart account. Instructions are located on the last page of this paperwork. If you have not heard from [place] regarding the results in 2 weeks, please contact this office.         Signed, Merri Ray, MD [facility]

## 2020-10-02 ENCOUNTER — Other Ambulatory Visit (HOSPITAL_COMMUNITY): Payer: Self-pay | Admitting: Family Medicine

## 2020-10-02 ENCOUNTER — Encounter: Payer: Self-pay | Admitting: Family Medicine

## 2020-10-02 ENCOUNTER — Other Ambulatory Visit: Payer: Self-pay | Admitting: Family Medicine

## 2020-10-02 DIAGNOSIS — K0889 Other specified disorders of teeth and supporting structures: Secondary | ICD-10-CM

## 2020-10-02 DIAGNOSIS — K029 Dental caries, unspecified: Secondary | ICD-10-CM

## 2020-10-02 DIAGNOSIS — E1165 Type 2 diabetes mellitus with hyperglycemia: Secondary | ICD-10-CM

## 2020-10-02 LAB — BASIC METABOLIC PANEL
BUN/Creatinine Ratio: 17 (ref 10–24)
BUN: 17 mg/dL (ref 8–27)
CO2: 23 mmol/L (ref 20–29)
Calcium: 9.1 mg/dL (ref 8.6–10.2)
Chloride: 100 mmol/L (ref 96–106)
Creatinine, Ser: 0.98 mg/dL (ref 0.76–1.27)
GFR calc Af Amer: 96 mL/min/{1.73_m2} (ref >59)
GFR calc non Af Amer: 83 mL/min/{1.73_m2} (ref >59)
Glucose: 167 mg/dL — ABNORMAL HIGH (ref 65–99)
Potassium: 4.8 mmol/L (ref 3.5–5.2)
Sodium: 137 mmol/L (ref 134–144)

## 2020-10-02 LAB — CBC
Hematocrit: 52.4 % — ABNORMAL HIGH (ref 37.5–51.0)
Hemoglobin: 17.7 g/dL (ref 13.0–17.7)
MCH: 29.6 pg (ref 26.6–33.0)
MCHC: 33.8 g/dL (ref 31.5–35.7)
MCV: 88 fL (ref 79–97)
Platelets: 215 10*3/uL (ref 150–450)
RBC: 5.97 x10E6/uL — ABNORMAL HIGH (ref 4.14–5.80)
RDW: 12.5 % (ref 11.6–15.4)
WBC: 5.6 10*3/uL (ref 3.4–10.8)

## 2020-10-02 LAB — SEDIMENTATION RATE: Sed Rate: 11 mm/hr (ref 0–30)

## 2020-10-02 LAB — HEMOGLOBIN A1C
Est. average glucose Bld gHb Est-mCnc: 180 mg/dL
Hgb A1c MFr Bld: 7.9 % — ABNORMAL HIGH (ref 4.8–5.6)

## 2020-10-02 MED ORDER — OZEMPIC (1 MG/DOSE) 2 MG/1.5ML ~~LOC~~ SOPN: 1.0000 mg | PEN_INJECTOR | SUBCUTANEOUS | 2 refills | Status: DC

## 2020-10-02 NOTE — Progress Notes (Deleted)
CARDIOLOGY OFFICE NOTE  Date:  10/02/2020    Troy Yu Troy Yu Alvia Grove Date of Birth: 08-17-1961 Medical Record #[id number]  PCP:  Shade Flood, MD  Cardiologist:  Eden Emms  No chief complaint on file.   History of Present Illness: 60 y.o. seen initially in ER June 2017 with subacute anterior MI complicated by distal embolization to right leg. Had a complicated RLE embolectomy requiring fasciotomy by Dr Imogene Burn with long healing process including wound Vac. Had DES to distal RCA and mid LAD with EF 30-35% large apical thrombus. Diagnosed with DM Has refused a lot of meds and care.   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 Diabetes (HCC)   . Hypercholesteremia   . LV (left ventricular) mural thrombus   . NSTEMI (non-ST elevated myocardial infarction) [facility])     Past Surgical History:  Procedure Laterality Date  . APPLICATION OF WOUND VAC Right 02/22/2016   Procedure: APPLICATION OF Negative Pressure Dressing Right Lower Leg;  Surgeon: Fransisco Hertz, MD;  [facility];  Service: Vascular;  Laterality: Right;  . APPLICATION OF WOUND VAC Right 02/26/2016   Procedure: APPLICATION OF WOUND VAC, RIGHT MEDIAL LOWER LEG FASCIOTOMY SITE;  Surgeon: Larina Earthly, MD;  Location: [facility] OR;  Service: Vascular;  Laterality: Right;  . CARDIAC CATHETERIZATION N/A 03/09/2016   Procedure: Coronary Stent Intervention;  Surgeon:  Iran Ouch, MD;  Location: [facility] INVASIVE CV LAB;  Service: Cardiovascular;  Laterality: N/A;  distal RCA mid LAD  . CARDIAC CATHETERIZATION N/A 03/09/2016   Procedure: Intravascular Pressure Wire/FFR Study;  Surgeon: Iran Ouch, MD;  Location: [facility] INVASIVE CV LAB;  Service: Cardiovascular;  Laterality: N/A;  mid LAD  . CARDIAC CATHETERIZATION N/A 03/09/2016   Procedure: Left Heart Cath and Coronary Angiography;  Surgeon: Iran Ouch, MD;  Location: [facility] INVASIVE CV LAB;  Service: Cardiovascular;  Laterality: N/A;  . EMBOLECTOMY Right 02/21/2016   Procedure: EMBOLECTOMY RIGHT COMMON FEMORAL ARTERY; FOUR COMPARTMENT FASCIOTOMY;  Surgeon: Fransisco Hertz, MD;  Location: [facility] OR;  Service: Vascular;  Laterality: Right;  . FASCIOTOMY CLOSURE Right 02/26/2016   Procedure: RIGHT LATERAL LOWER LEG FASCIOTOMY CLOSURE;  Surgeon: Larina Earthly, MD;  Location: [facility] OR;  Service: Vascular;  Laterality: Right;  . HEMATOMA EVACUATION Right 02/22/2016   Procedure: EVACUATION HEMATOMA With Placement of Negative Pressure Dressing;  Surgeon: Fransisco Hertz, MD;  Location: [facility] OR;  Service: Vascular;  Laterality: Right;     Medications: Current Outpatient Medications  Medication Sig Dispense Refill  . amoxicillin-clavulanate (AUGMENTIN) 875-125 MG tablet Take 1 tablet by mouth 2 (two) times daily. 20 tablet 0  . atorvastatin (LIPITOR) 20 MG tablet Take 1 tablet (20 mg total) by mouth daily. Please make overdue appt with Dr. Eden Emms before anymore  refills. Thank you 3rd and Final Attempt 15 tablet 0  . carvedilol (COREG) 6.25 MG tablet Take 1 tablet (6.25 mg total) by mouth 2 (two) times daily with a meal. Please schedule appt for future refills. 1 attempt 60 tablet 0  . diclofenac Sodium (VOLTAREN) 1 % GEL Apply 2 g topically 4 (four) times daily. 350 g 0  . glucose blood (ONETOUCH VERIO) test strip 1 strip by In Vitro route 2 times daily. Dx: E11.9 100 each 0  . HYDROcodone-acetaminophen (NORCO/VICODIN) 5-325 MG  tablet Take 1 tablet by mouth every 6 (six) hours as needed for moderate pain. 20 tablet 0  . Semaglutide, 1 MG/DOSE, (OZEMPIC, 1 MG/DOSE,) 2 MG/1.5ML SOPN Inject 1 mg into the skin once a week. 18 mL 2   No current facility-administered medications for this visit.    Allergies: Allergies  Allergen Reactions  . Metformin     Other reaction(s): Dizziness (intolerance)    Social History: The patient  reports that he has never smoked. He has never used smokeless tobacco. He reports that he does not drink alcohol and does not use drugs.   Family History: The patient's family history includes Heart Problems in his father and mother.   Review of Systems: Please see the history of present illness.   Otherwise, the review of systems is positive for none.   All other systems are reviewed and negative.   Physical Exam: VS:  There were no vitals taken for this visit. Marland Kitchen  BMI There is no height or weight on file to calculate BMI.  Wt Readings from Last 3 Encounters:  10/01/20 95.7 kg  07/31/20 95.7 kg  07/06/20 93.9 kg   Affect appropriate Healthy:  appears stated age HEENT: normal Neck supple with no adenopathy JVP normal no bruits no thyromegaly Lungs clear with no wheezing and good diaphragmatic motion Heart:  S1/S2 no murmur, no rub, gallop or click PMI enlarged  Abdomen: benighn, BS positve, no tenderness, no AAA no bruit.  No HSM or HJR Distal pulses intact with no bruits No edema Neuro non-focal Skin warm and dry No muscular weakness Fasciotomy scars RLE with some foot drop    LABORATORY DATA:  EKG:   .07/16/19  SR rate 78 old anterior MI   Lab Results  Component Value Date   WBC 5.6 10/01/2020   HGB 17.7 10/01/2020   HCT 52.4 (H) 10/01/2020   PLT 215 10/01/2020   GLUCOSE 167 (H) 10/01/2020   CHOL 141 07/07/2020   TRIG 79 07/07/2020   HDL 44 07/07/2020   LDLCALC 82 07/07/2020   ALT 39 07/07/2020   AST 20 07/07/2020   NA 137 10/01/2020   K 4.8 10/01/2020    CL 100 10/01/2020   CREATININE 0.98 10/01/2020   BUN 17 10/01/2020   CO2 23 10/01/2020   TSH 3.200 03/03/2016   INR 2.4 09/16/2019   HGBA1C 7.9 (H) 10/01/2020    BNP (last 3 results) No results for input(s): BNP in the last 8760 hours.  ProBNP (last 3 results) No results for input(s): PROBNP in the last 8760 hours.   Other Studies Reviewed Today:  Cardiac Cath Conclusion from 02/2016    Prox RCA lesion, 10% stenosed.  Dist LAD lesion, 60% stenosed.  Dist RCA lesion, 90% stenosed. Post intervention, there is a 0% residual stenosis.  Mid LAD lesion, 60% stenosed. Post intervention, there is a 0% residual stenosis.  1. Significant 2 vessel coronary artery disease involving the distal right coronary artery  and mid LAD. The culprit for recent myocardial infarction seems to be the mid LAD stenosis which appears to be thrombotic and hazy in spite of being only 60% in severity. However, this was significant by FFR.  2. Moderately elevated left ventricular end-diastolic pressure. LV angiography was not performed due to LV thrombus.  3. Successful angioplasty and drug-eluting stent placement to the distal right coronary artery and mid left anterior descending artery.   Recommendations: Recommend treatment with aspirin, Plavix and warfarin for one month. After one month, aspirin can be discontinued. Heparin can be resumed today 8 hours after sheath pull. Warfarin can be started. I switched from propranolol to carvedilol. Continue treatment for cardiomyopathy and add an ACE inhibitor or ARB before hospital discharge. There was residual 60% distal LAD stenosis which was left to be treated medically.   Echo Study Conclusions from 12/19/17   Study Conclusions  - Left ventricle: The cavity size was normal. Systolic function was   moderately reduced. The estimated ejection fraction was in the   range of 35% to 40%. Doppler parameters are consistent with   abnormal left  ventricular relaxation (grade 1 diastolic   dysfunction). Mild to moderate concentric and moderate focal   basal septal hypertrophy. - Regional wall motion abnormality: Akinesis of the mid   anteroseptal and apical myocardium; severe hypokinesis of the   apical anterior, apical inferior, and apical septal myocardium;   moderate hypokinesis of the apical lateral myocardium. - Aortic valve: There was mild regurgitation. - Aorta: Mild aortic root dilatation. .  Assessment/Plan: 1. Anterior MI - July 2017  with associated LV dysfunction. Cardiac catheterization with DES to distal RCA and Mid LAD. Residual CAD noted. Would plan to treat medically.  81 mg ASA  2. Ischemic CM - with EF of 30 to 35%. This improved to 35-40% by TTE done 12/19/17 no residual apical thrombus Not compliant With meds and not taking ARB/ACE    3. LV (left ventricular) mural thrombus (HCC) with embolization of right femoral artery resulting in limb ischemia s/p thrombectomy and fasciotomy with post operative course complicated by hematoma and bleeding s/p evacuation of hematoma with application of a VAC in the setting of therapeutic anti-coagulation. Seen by Dr. Arbie Cookey. Finally healed  TTE 12/19/17 thrombus resolved Coumadin now stopped   4. Diabetes mellitus/Hyperglycemia -Hemoglobin A1c 9.2. f/u with primary currently not taking any meds  -supposed to be on Januvia   6. PVD:   Still with foot drop. F/u with [facility] Dr Early   7. Cholesterol:  At goal he insists on cutting lipitor back to 20 mg f/u labs 3 months   Lab Results  Component Value Date   LDLCALC 82 07/07/2020     F/U in a year   Charlton Haws

## 2020-10-07 ENCOUNTER — Ambulatory Visit: Payer: 59 | Admitting: Cardiovascular Disease

## 2020-10-15 ENCOUNTER — Other Ambulatory Visit: Payer: Self-pay | Admitting: Cardiovascular Disease

## 2021-01-11 ENCOUNTER — Other Ambulatory Visit (HOSPITAL_COMMUNITY): Payer: Self-pay

## 2021-01-11 MED FILL — Semaglutide Soln Pen-inj 0.25 or 0.5 MG/DOSE (2 MG/1.5ML): SUBCUTANEOUS | 84 days supply | Qty: 4.5 | Fill #0 | Status: AC

## 2021-01-16 ENCOUNTER — Other Ambulatory Visit (HOSPITAL_COMMUNITY): Payer: Self-pay

## 2021-05-04 ENCOUNTER — Other Ambulatory Visit (HOSPITAL_COMMUNITY): Payer: Self-pay

## 2021-05-04 MED FILL — Semaglutide Soln Pen-inj 0.25 or 0.5 MG/DOSE (2 MG/1.5ML): SUBCUTANEOUS | 28 days supply | Qty: 1.5 | Fill #1 | Status: AC

## 2021-06-02 ENCOUNTER — Other Ambulatory Visit (HOSPITAL_COMMUNITY): Payer: Self-pay

## 2021-06-02 MED ORDER — AMOXICILLIN 500 MG PO CAPS
500.0000 mg | ORAL_CAPSULE | Freq: Three times a day (TID) | ORAL | 0 refills | Status: DC
  Filled 2021-06-02: qty 21, 7d supply, fill #0

## 2021-06-04 ENCOUNTER — Other Ambulatory Visit (HOSPITAL_COMMUNITY): Payer: Self-pay

## 2021-06-04 MED FILL — Semaglutide Soln Pen-inj 0.25 or 0.5 MG/DOSE (2 MG/1.5ML): SUBCUTANEOUS | 28 days supply | Qty: 1.5 | Fill #2 | Status: CN

## 2021-06-05 ENCOUNTER — Other Ambulatory Visit (HOSPITAL_COMMUNITY): Payer: Self-pay

## 2021-10-05 ENCOUNTER — Other Ambulatory Visit (HOSPITAL_COMMUNITY): Payer: Self-pay

## 2021-10-05 ENCOUNTER — Other Ambulatory Visit: Payer: Self-pay | Admitting: Family Medicine

## 2021-10-06 ENCOUNTER — Other Ambulatory Visit (HOSPITAL_COMMUNITY): Payer: Self-pay

## 2021-10-09 ENCOUNTER — Other Ambulatory Visit (HOSPITAL_COMMUNITY): Payer: Self-pay

## 2021-10-11 ENCOUNTER — Other Ambulatory Visit: Payer: Self-pay | Admitting: Family Medicine

## 2021-10-11 ENCOUNTER — Telehealth: Payer: Self-pay | Admitting: Family Medicine

## 2021-10-11 DIAGNOSIS — E1165 Type 2 diabetes mellitus with hyperglycemia: Secondary | ICD-10-CM

## 2021-10-11 MED ORDER — OZEMPIC (1 MG/DOSE) 2 MG/1.5ML ~~LOC~~ SOPN: 1.0000 mg | PEN_INJECTOR | SUBCUTANEOUS | 0 refills | Status: DC

## 2021-10-11 NOTE — Telephone Encounter (Signed)
Pt needs an appointment to continue refills. I have sent in 3 week supply until he can be seen

## 2021-10-11 NOTE — Telephone Encounter (Signed)
Reason for Call Symptomatic / Request for Health Information Initial Comment Caller states he would like to schedule an appointment. Caller states he is out of his medication and has not been taking the medication for two weeks. Caller states his blood sugar has went up. Caller states he needs a renewal on the medication. Translation No Nurse Assessment Nurse: Glean Salvo, RN, [place] Date/Time (Eastern Time): 10/09/2021 4:12:54 PM Confirm and document reason for call. If symptomatic, describe symptoms. ---Caller states he would like to schedule an appointment. Caller states he is out of his medication and has not been taking the medication for two weeks. Caller states his blood sugar has went up. Caller states he needs a renewal on the medication. Ozempic. [facility] Does the patient have any new or worsening symptoms? ---No Disp. Time Eilene Ghazi Time) Disposition Final User 10/09/2021 4:33:50 PM Attempt made - message left Glean Salvo, RN, Hayden Rasmussen 10/09/2021 4:44:54 PM Clinical Call Yes Glean Salvo, RN, E[place] Comments User: Ellison Carwin, RN Date/Time Eilene Ghazi Time): 10/09/2021 4:45:28 PM Pharmacy out of Prescription. Caller advised to find a pharmacy that has it that Travis can send to.    2nd call to health team     Reason for Call Symptomatic / Request for [place] is needing a prescription refill and it's not available at his regular pharmacy. He needs it sent to [place]. The rx is Ozempic 5mg . Translation No Disp. Time Eilene Ghazi Time) Disposition Final User 10/09/2021 6:03:39 PM Clinical Call Yes Gilford Rile, RN, Ginny Comments User: Kandice Robinsons, RN Date/Time Eilene Ghazi Time): 10/09/2021 6:03:29 PM Advised unable to refill due to no refills left and not original pharmacy. Advising to contact office on monday. Stating most recent cbg 150. Advised to hydrate/watch diet, call office monday. Advised ED/UC if blood  sugars worsening

## 2021-10-11 NOTE — Telephone Encounter (Signed)
Pt has been scheduled.  °

## 2021-10-12 ENCOUNTER — Other Ambulatory Visit: Payer: Self-pay

## 2021-10-12 ENCOUNTER — Telehealth: Payer: Self-pay | Admitting: Family Medicine

## 2021-10-12 DIAGNOSIS — E1165 Type 2 diabetes mellitus with hyperglycemia: Secondary | ICD-10-CM

## 2021-10-12 MED ORDER — OZEMPIC (1 MG/DOSE) 2 MG/1.5ML ~~LOC~~ SOPN: 1.0000 mg | PEN_INJECTOR | SUBCUTANEOUS | 0 refills | Status: DC

## 2021-10-12 NOTE — Telephone Encounter (Signed)
Attempted call to patient with no success medication was not picked up as it is out

## 2021-10-12 NOTE — Telephone Encounter (Signed)
Spoke to patient we found this at [address] and this has been resent Pt was informed

## 2021-10-12 NOTE — Telephone Encounter (Signed)
Can check [facility] or [facility] to see if they have in stock - if so I can resend Rx. Unfortunately this class of meds has been having some shortages lately.

## 2021-10-12 NOTE — Telephone Encounter (Signed)
They are out of the 1 mg dose they only have 3 and 4 mg doses so patient has not picked up medication

## 2021-10-12 NOTE — Telephone Encounter (Signed)
FYI pt BG is elevated

## 2021-10-12 NOTE — Telephone Encounter (Signed)
Chief Complaint Blood Sugar High Reason for Call Medication Question / Request Initial Comment Caller states prescription sent was not received at [facility] pharmacy. Blood sugar is 238. He states he has not gotten his medication in over two weeks. Translation No Nurse Assessment Nurse: Oretha Ellis, RN, Will Date/Time [name]: 10/11/2021 5:55:04 PM Confirm and document reason for call. If symptomatic, describe symptoms. ---Caller reports that the wrong medication was sent to the pharmacy. Ozempic is the medication and he has not taken the medication for two weeks. Blood sugar of 238 this morning. Does the patient have any new or worsening symptoms? ---Yes Will a triage be completed? ---Yes Related visit to physician within the last 2 weeks? ---No Does the PT have any chronic conditions? (i.e. diabetes, asthma, this includes High risk factors for pregnancy, etc.) ---Yes List chronic conditions. ---dm Is this a behavioral health or substance abuse call? ---No Guidelines Guideline Title Affirmed Question Affirmed Notes Nurse Date/Time (Eastern Time) Diabetes - High Blood Sugar [1] Caller has NONURGENT medication or insulin pump question AND [2] Oretha Ellis, RN, Will 10/11/2021 5:58:00 PM PLEASE NOTE: All timestamps contained within this report are represented as [place] Standard Time. CONFIDENTIALTY NOTICE: This fax transmission is intended only for the addressee. It contains information that is legally privileged, confidential or otherwise protected from use or disclosure. If you are not the intended recipient, you are strictly prohibited from reviewing, disclosing, copying using or disseminating any of this information or taking any action in reliance on or regarding this information. If you have received this fax in error, please notify us immediately by telephone so that we can arrange for its return to [place]. Phone: [phone], Toll-Free: [phone], Fax: [phone] Page: 2  of 2 Call Id: [id number] Guidelines Guideline Title Affirmed Question Affirmed Notes Nurse Date/Time [name] triager unable to answer question Disp. Time [name] Disposition Final User 10/11/2021 6:01:55 PM Call PCP within 24 Hours Yes Oretha Ellis, RN, Will Caller Disagree/Comply Comply Caller Understands Yes PreDisposition Did not know what to do Care Advice Given Per Guideline CALL PCP WITHIN 24 HOURS: * You need to discuss this with your doctor (or NP/PA) within the next 24 hours. CALL BACK IF: * You become worse CARE ADVICE given per Diabetes - High Blood Sugar (Adult) guideline.

## 2021-10-12 NOTE — Telephone Encounter (Signed)
Noted.  Hopefully the temporary supply of meds sent yesterday will help. If he has any new symptoms at these elevated readings, should be seen sooner.  Please verify that medicine sent yesterday was filled by pharmacy.  Thanks.

## 2021-10-13 ENCOUNTER — Other Ambulatory Visit: Payer: Self-pay | Admitting: Family Medicine

## 2021-10-13 DIAGNOSIS — E1165 Type 2 diabetes mellitus with hyperglycemia: Secondary | ICD-10-CM

## 2021-10-13 MED ORDER — SEMAGLUTIDE(0.25 OR 0.5MG/DOS) 2 MG/1.5ML ~~LOC~~ SOPN: 0.5000 mg | PEN_INJECTOR | SUBCUTANEOUS | 1 refills | Status: DC

## 2021-10-13 NOTE — Telephone Encounter (Signed)
Patient called and states that he received no communication about his medication being sent in. I let him know medication was sent to [facility] [address] He states he called them and they told him they did not have his dose and the prescription was written for a higher dose. [facility] [address] And they have the .5mg  dose available and he would like to pick up. [facility] is [facility] with correct dosing as the dial for the 1mg  cannot be set to .5mg .   Prescription to be rewritten for the .5mg  dose and sent to [facility].

## 2021-10-13 NOTE — Addendum Note (Signed)
Addended by: Criselda Peaches on: 10/13/2021 03:40 PM   Modules accepted: Orders

## 2021-10-14 ENCOUNTER — Other Ambulatory Visit (HOSPITAL_BASED_OUTPATIENT_CLINIC_OR_DEPARTMENT_OTHER): Payer: Self-pay

## 2021-10-14 ENCOUNTER — Other Ambulatory Visit: Payer: Self-pay | Admitting: Family Medicine

## 2021-10-14 ENCOUNTER — Telehealth: Payer: Self-pay

## 2021-10-14 DIAGNOSIS — E1165 Type 2 diabetes mellitus with hyperglycemia: Secondary | ICD-10-CM

## 2021-10-14 NOTE — Telephone Encounter (Signed)
This needs to be clarified as patient is to be taking 1mg  Per Dr and lab work last February need to ensure this is for 1 mg and not half. Will check into this request

## 2021-10-14 NOTE — Telephone Encounter (Signed)
Pt called in stating the the medication needs a PA. I have updated his insurance information in the system. Please advise pt would like a call back and he has been out of this medication for over 3 wks.

## 2021-10-14 NOTE — Telephone Encounter (Signed)
Caller name:Field AlTaher Chinita Greenland   On DPR? :Yes  Call back number:[phone]  Provider they see: Neva Seat   Reason for call:Pt is calling he has been out of meds for three weeks pharmacy needs pro authorization Semaglutide,0.25 or 0.5MG /DOS, 2 MG/1.5ML SOPN

## 2021-10-15 ENCOUNTER — Telehealth: Payer: Self-pay | Admitting: Family Medicine

## 2021-10-15 NOTE — Telephone Encounter (Signed)
Please refer to next phone note for more information on this.  

## 2021-10-15 NOTE — Telephone Encounter (Signed)
Prior authorization paperwork completed by CMA, pending provider signature and fax.

## 2021-10-15 NOTE — Telephone Encounter (Signed)
Handled in different encounter in chart.

## 2021-10-15 NOTE — Telephone Encounter (Signed)
Chief Complaint Blood Sugar High Reason for Call Symptomatic / Request for Health Information Initial Comment Caller states that he has been out of medication for 3 weeks. Ozempic .5mg  is what he is needing. Caller states his blood sugar is 238. Translation No Nurse Assessment Nurse: , RN, Elray Buba Date/Time (Eastern Time): 10/14/2021 5:02:27 PM Confirm and document reason for call. If symptomatic, describe symptoms. ---Caller states he needs a refill on his Ozempic. Denies any symptoms at this time. Caller states the prescription is at the pharmacy but he needs pre authorization sent. Caller states current blood sugar is 250. Caller is very frustrated. Does the patient have any new or worsening symptoms? ---No Please document clinical information provided and list any resource used. ---Caller encouraged to call the office to follow up with pre authorization. Disp. Time 10/16/2021 Time) Disposition Final User 10/14/2021 5:08:02 PM Clinical Call Yes 10/16/2021, RN, Elray Buba Comments User: Huntley Dec, RN Date/Time Demetria Pore Time): 10/14/2021 5:09:43 PM RN attempted to call office to reach staff in regards to pre authorization for this caller

## 2021-10-15 NOTE — Telephone Encounter (Signed)
I called and lvm for patient asking them to call his insurance for update on prior authorization as we have not gotten resolution. I also let him know that PA was faxed to them around noon today. I asked he call me back if he had further questions.

## 2021-10-15 NOTE — Telephone Encounter (Signed)
Patient called and I spoke to him. I let him know that per his lab notes last February he was to be on 1mg  Ozempic. Patient states that he does not want to be on that dose until after he speaks with Dr. at his next appointment and has labs drawn. He would like Neva Seat to continue the prior authorization for the .5mg   as that is what he has been taking all along. I let patient know I would process prior authorization and would reach back out to him once we had an outcome.

## 2021-10-20 ENCOUNTER — Ambulatory Visit (INDEPENDENT_AMBULATORY_CARE_PROVIDER_SITE_OTHER): Payer: 59 | Admitting: Family Medicine

## 2021-10-20 ENCOUNTER — Encounter: Payer: Self-pay | Admitting: Family Medicine

## 2021-10-20 VITALS — BP 128/78 | HR 103 | Temp 97.9°F | Resp 16 | Ht 68.0 in | Wt 210.8 lb

## 2021-10-20 DIAGNOSIS — E1165 Type 2 diabetes mellitus with hyperglycemia: Secondary | ICD-10-CM

## 2021-10-20 DIAGNOSIS — K0889 Other specified disorders of teeth and supporting structures: Secondary | ICD-10-CM

## 2021-10-20 DIAGNOSIS — Z13220 Encounter for screening for lipoid disorders: Secondary | ICD-10-CM

## 2021-10-20 DIAGNOSIS — I5041 Acute combined systolic (congestive) and diastolic (congestive) heart failure: Secondary | ICD-10-CM

## 2021-10-20 DIAGNOSIS — R0789 Other chest pain: Secondary | ICD-10-CM | POA: Diagnosis not present

## 2021-10-20 DIAGNOSIS — Z130 Encounter for screening for diseases of the blood and blood-forming organs and certain disorders involving the immune mechanism: Secondary | ICD-10-CM

## 2021-10-20 NOTE — Progress Notes (Signed)
Subjective:  Patient ID: Troy Yu, male    DOB: September 13, 1960  Age: 61 y.o. MRN: [id number]  CC:  Chief Complaint  Patient presents with   Diabetes    Pt here for follow notes he did go without meds a few weeks. Doing well     HPI Troy Yu presents for   Diabetes 2 With history of hyperglycemia, combined systolic, diastolic CHF, history of NSTEMI.  Cardiologist Dr. Johnsie Cancel - appt in May.  Last visit with me in February 2022. Previously had tried metformin, but caused dizziness.  Changed to Ozempic, 0.5 mg weekly. Uncontrolled with A1c of 7.9 on 10/01/2020.  Recommended increasing Ozempic to 1 mg weekly, with repeat exam in 3 months. See recent notes, he states he was never given the 24m - continued the 0.557minjection, then ran out 3 weeks ago. Less exercise past few months and weight increased. having to work late. walking up to 7 miles prior - lost weight last year, down to just under 200#.  Home readings when taking Ozempic:  Home 130 fasting , 140 after meal. Off meds - 238, 213.    No n/v/abd pain, no change in thirst.  Restarted ozempic last Saturday.   Did not follow up with optho - plans to get appointment soon.  Lab Results  Component Value Date   HGBA1C 7.9 (H) 10/01/2020   HGBA1C 8.1 (H) 07/07/2020   HGBA1C 5.9 (H) 06/01/2016   Lab Results  Component Value Date   LDLCALC 82 07/07/2020   CREATININE 0.98 10/01/2020    Hx of CAD, NSTEMI in July 2017,  Cardiology -Dr. NiJohnsie Cancelappt above in 3 months, on cancellation list for sooner appt. Last visit with Dr. NiJohnsie Canceln 06/2019.  No exertional CP. Sore in upper chest muscle sore in bed at times.  No DOE.  Occasional palpitations - stressful situation only. Every few months only.   Hx of PVD: Followed by Dr. EaDonnetta Hutchingno recent visit. Prior embolization of right femoral artery resulting in limb ischemia s/p thrombectomy and fasciotomy with post operative course complicated by hematoma and bleeding s/p  evacuation of hematoma with application of a VAC in the setting of therapeutic anti-coagulation, TTE 12/19/17, thrombus resolved but maintained on coumadin per VVSper last note by Dr. NiJohnsie CancelSeen by Dr. EaDonnetta HutchingCoumadin was stopped. On ASA alone at this time.     At end of visit asked about antibiotic for tooth issue. No fever.  Last saw dentist in January. Had filling placed. Had abx for extraction in January.  Pain in R upper tooth past few days. Took ibuprofen few days ago, pain was better. None yesterday. No pain today, but concerned it may come back. Has not called dentist for this issue.    History Patient Active Problem List   Diagnosis Date Noted   History of fasciotomy 06/07/2016   Status post coronary artery stent placement    Acute combined systolic and diastolic heart failure (HCC)    Abnormal CXR    Abdominal pain    Chest pain    NSTEMI (non-ST elevated myocardial infarction) ([facility]   Fever    Thromboembolism (HCC)    Vascular occlusion    Pain    Anterior subendocardial MI ([facility]/25/2017   Right leg pain 02/21/2016   Non-STEMI (non-ST elevated myocardial infarction) ([facility]/25/2017   LV (left ventricular) mural thrombus 02/21/2016   Elevated troponin 02/21/2016   Abnormal EKG 02/21/2016   Acute MI ([facility]  02/21/2016   History of embolectomy 02/21/2016   Diabetes [facility])    Past Medical History:  Diagnosis Date   Diabetes ([place])    Hypercholesteremia    LV (left ventricular) mural thrombus    NSTEMI (non-ST elevated myocardial infarction) [facility])    Past Surgical History:  Procedure Laterality Date   APPLICATION OF WOUND VAC Right 02/22/2016   Procedure: APPLICATION OF Negative Pressure Dressing Right Lower Leg;  Surgeon: Conrad [place], MD;  Location: [place];  Service: Vascular;  Laterality: Right;   APPLICATION OF WOUND VAC Right 02/26/2016   Procedure: APPLICATION OF WOUND VAC, RIGHT MEDIAL LOWER LEG FASCIOTOMY SITE;  Surgeon: Rosetta Posner, MD;  Location: [place];   Service: Vascular;  Laterality: Right;   CARDIAC CATHETERIZATION N/A 03/09/2016   Procedure: Coronary Stent Intervention;  Surgeon: Wellington Hampshire, MD;  Location: [place] CV LAB;  Service: Cardiovascular;  Laterality: N/A;  distal RCA mid LAD   CARDIAC CATHETERIZATION N/A 03/09/2016   Procedure: Intravascular Pressure Wire/FFR Study;  Surgeon: Wellington Hampshire, MD;  Location: [place] CV LAB;  Service: Cardiovascular;  Laterality: N/A;  mid LAD   CARDIAC CATHETERIZATION N/A 03/09/2016   Procedure: Left Heart Cath and Coronary Angiography;  Surgeon: Wellington Hampshire, MD;  Location: [place] CV LAB;  Service: Cardiovascular;  Laterality: N/A;   EMBOLECTOMY Right 02/21/2016   Procedure: EMBOLECTOMY RIGHT COMMON FEMORAL ARTERY; FOUR COMPARTMENT FASCIOTOMY;  Surgeon: Conrad Verona, MD;  Location: [place];  Service: Vascular;  Laterality: Right;   FASCIOTOMY CLOSURE Right 02/26/2016   Procedure: RIGHT LATERAL LOWER LEG FASCIOTOMY CLOSURE;  Surgeon: Rosetta Posner, MD;  Location: [place];  Service: Vascular;  Laterality: Right;   HEMATOMA EVACUATION Right 02/22/2016   Procedure: EVACUATION HEMATOMA With Placement of Negative Pressure Dressing;  Surgeon: Conrad [place], MD;  Location: [place];  Service: Vascular;  Laterality: Right;   Allergies  Allergen Reactions   Metformin     Other reaction(s): Dizziness (intolerance)   Prior to Admission medications   Medication Sig Start Date End Date Taking? Authorizing Provider  amoxicillin (AMOXIL) 500 MG capsule Take 1 capsule (500 mg total) by mouth 3 (three) times daily until finished 06/02/21     amoxicillin-clavulanate (AUGMENTIN) 875-125 MG tablet Take 1 tablet by mouth 2 (two) times daily. 10/01/20   Wendie Agreste, MD  atorvastatin (LIPITOR) 20 MG tablet Take 1 tablet (20 mg total) by mouth daily. Please make overdue appt with Dr. Johnsie Cancel before anymore refills. Thank you 3rd and Final Attempt 09/28/20   Josue Hector, MD  atorvastatin (LIPITOR) 20 MG  tablet TAKE 1 TABLET BY MOUTH ONCE A DAY (OVERDUE FOR OFFICE VISIT,3RD AND FINAL ATTEMPT) 09/28/20 09/28/21  Josue Hector, MD  atorvastatin (LIPITOR) 20 MG tablet TAKE 1 TABLET BY MOUTH ONCE A DAY (NEEDS OFFICE VISIT) 08/27/20 08/27/21  Josue Hector, MD  carvedilol (COREG) 6.25 MG tablet Take 1 tablet (6.25 mg total) by mouth 2 (two) times daily with a meal. Please schedule appt for future refills. 1 attempt 08/27/20   Josue Hector, MD  carvedilol (COREG) 6.25 MG tablet TAKE 1 TABLET (6.25 MG TOTAL) BY MOUTH 2 (TWO) TIMES DAILY WITH A MEAL. PLEASE SCHEDULE APPT FOR FUTURE REFILLS. 1 ATTEMPT 08/27/20 08/27/21  Josue Hector, MD  diclofenac Sodium (VOLTAREN) 1 % GEL Apply 2 g topically 4 (four) times daily. 04/24/20   Augusto Gamble B, NP  glucose blood (ONETOUCH VERIO) test strip 1 strip  by In Vitro route 2 times daily. Dx: E11.9 06/16/20   Vanessa Kick, MD  HYDROcodone-acetaminophen (NORCO/VICODIN) 5-325 MG tablet Take 1 tablet by mouth every 6 (six) hours as needed for moderate pain. 10/01/20   Wendie Agreste, MD  OZEMPIC, 0.25 OR 0.5 MG/DOSE, 2 MG/1.5ML SOPN INJECT 0.5 MG INTO THE SKIN ONCE A WEEK. 10/15/21   Wendie Agreste, MD   Social History   Socioeconomic History   Marital status: Married    Spouse name: [name]   Number of children: 5   Years of education: COLLEGE   Highest education level: Not on file  Occupational History   Occupation: UNEMPLOYED  Tobacco Use   Smoking status: Never   Smokeless tobacco: Never  Vaping Use   Vaping Use: Never used  Substance and Sexual Activity   Alcohol use: No   Drug use: No   Sexual activity: Not on file  Other Topics Concern   Not on file  Social History Narrative   Not on file   Social Determinants of Health   Financial Resource Strain: Not on file  Food Insecurity: Not on file  Transportation Needs: Not on file  Physical Activity: Not on file  Stress: Not on file  Social Connections: Not on file  Intimate Partner  Violence: Not on file    Review of Systems   Objective:   Vitals:   10/20/21 1413  BP: 128/78  Pulse: (!) 103  Resp: 16  Temp: 97.9 F (36.6 C)  TempSrc: Temporal  SpO2: 96%  Weight: 210 lb 12.8 oz (95.6 kg)  Height: '5\' 8"'  (1.727 m)     Physical Exam Vitals reviewed.  Constitutional:      Appearance: He is well-developed.  HENT:     Head: Normocephalic and atraumatic.     Mouth/Throat:     Comments: Possible dental decay centrally of posterior most upper molar right side, but no focal tenderness, no apparent abscess, gums nontender and no apparent gum swelling.  No discharge. Neck:     Vascular: No carotid bruit or JVD.  Cardiovascular:     Rate and Rhythm: Normal rate and regular rhythm.     Heart sounds: Normal heart sounds. No murmur heard. Pulmonary:     Effort: Pulmonary effort is normal.     Breath sounds: Normal breath sounds. No rales.  Musculoskeletal:     Right lower leg: No edema.     Left lower leg: No edema.  Skin:    General: Skin is warm and dry.  Neurological:     Mental Status: He is alert and oriented to person, place, and time.  Psychiatric:        Mood and Affect: Mood normal.      76 minutes spent during visit, including chart review, previous specialist note review, discussion of additional concerns in office, clarification of questions,  counseling and assimilation of information, exam, discussion of plan, and chart completion.   EKG Sinus rhythm, rate 90.  Low voltage precordial leads.  No apparent significant change from 07/16/2019.  History of anterior lateral, inferior infarct.  Assessment & Plan:  Troy Yu is a 61 y.o. male . Type 2 diabetes mellitus with hyperglycemia, without long-term current use of insulin (HCC) - Plan: Microalbumin / creatinine urine ratio, Hemoglobin A1c, Comprehensive metabolic panel, CANCELED: Comprehensive metabolic panel, CANCELED: Hemoglobin A1c  - uncontrolled by last A1c and delay in follow  up. Denies barriers to follow up at this time.unfortunately did not  increase ozempic as planned last year.  - cehck A1c, labs, home readings on current dose ozempic and follow up 2 weeks to discuss med regimen    Screening for hyperlipidemia - Plan: Comprehensive metabolic panel, Lipid panel, CANCELED: Comprehensive metabolic panel, CANCELED: Lipid panel  Screening for deficiency anemia - Plan: Comprehensive metabolic panel, CBC with Differential/Platelet, CANCELED: Comprehensive metabolic panel, CANCELED: CBC with Differential/Platelet  Acute combined systolic and diastolic heart failure (HCC) - Plan: EKG 12-Lead Chest wall pain - Plan: EKG 12-Lead  - euvolemic. Follow up planned with cardiology. No acute findings on EKG.possible chest wall pain as above, but ER precautions given if recurs.   - follow up with vascular specialist.   Pain, dental  - improved after home ibuprofen. No sign of abscess at this time. Follow up with dentist - no antibiotic appears to be indicated at this time. Rtc/ER precautions.   No orders of the defined types were placed in this encounter.  Patient Instructions  I do not see any sign of infection around the upper right tooth.  I am glad to hear that pain has improved today.  Please contact your dental provider to advise of that pain but be seen right away if you do have any worsening symptoms.  I do not see a reason for antibiotics at this time.   Fasting lab visit tomorrow, but follow up to discuss results in 2 weeks so we can adjust meds if needed. Bring record of home readings - fasting or 2 hours after meals to that visit. 1-2 readings per day.   If any new chest pains be seen right away. Keep follow up with cardiology and call Dr. Luther Parody office for a follow up appointment.   Return to the clinic or go to the nearest emergency room if any of your symptoms worsen or new symptoms occur.   Type 2 Diabetes Mellitus, Self-Care, Adult Caring for yourself after  you have been diagnosed with type 2 diabetes (type 2 diabetes mellitus) means keeping your blood sugar (glucose) under control with a balance of: Nutrition. Exercise. Lifestyle changes. Medicines or insulin, if needed. Support from your team of health care providers and others. What are the risks? Having type 2 diabetes can put you at risk for other long-term (chronic) conditions, such as heart disease and kidney disease. Your health care provider may prescribe medicines to help prevent complications from diabetes. How to monitor your blood glucose  Check your blood glucose every day or as often as told by your health care provider. Have your A1C (hemoglobin A1C) level checked two or more times a year, or as often as told by your health care provider. Your health care provider will set personalized treatment goals for you. Generally, the goal of treatment is to maintain the following blood glucose levels: Before meals: 80-130 mg/dL (4.4-7.2 mmol/L). After meals: below 180 mg/dL (10 mmol/L). A1C level: less than 7%. How to manage hyperglycemia and hypoglycemia Hyperglycemia symptoms Hyperglycemia, also called high blood glucose, occurs when blood glucose is too high. Make sure you know the early signs of hyperglycemia, such as: Increased thirst. Hunger. Feeling very tired. Needing to urinate more often than usual. Blurry vision. Hypoglycemia symptoms Hypoglycemia, also called low blood glucose, occurs with a blood glucose level at or below 70 mg/dL (3.9 mmol/L). Diabetes medicines lower your blood glucose and can cause hypoglycemia. The risk for hypoglycemia increases during or after exercise, during sleep, during illness, and when skipping meals or not eating for a  long time (fasting). It is important to know the symptoms of hypoglycemia and treat it right away. Always have a 15-gram rapid-acting carbohydrate snack with you to treat low blood glucose. Family members and close friends should  also know the symptoms and understand how to treat hypoglycemia, in case you are not able to treat yourself. Symptoms may include: Hunger. Anxiety. Sweating and feeling clammy. Dizziness or feeling light-headed. Sleepiness. Increased heart rate. Irritability. Tingling or numbness around the mouth, lips, or tongue. Restless sleep. Severe hypoglycemia is when your blood glucose level is at or below 54 mg/dL (3 mmol/L). Severe hypoglycemia is an emergency. Do not wait to see if the symptoms will go away. Get medical help right away. Call your local emergency services (911 in the [place] Do not drive yourself to the hospital. If you have severe hypoglycemia and you cannot eat or drink, you may need glucagon. A family member or close friend should learn how to check your blood glucose and how to give you glucagon. Ask your health care provider if you need to have an emergency glucagon kit available. Follow these instructions at home: Medicines Take prescribed insulin or diabetes medicines as told by your health care provider. Do not run out of insulin or other diabetes medicines. Plan ahead so you always have these available. If you use insulin, adjust your dosage based on your physical activity and what foods you eat. Your health care provider will tell you how to adjust your dosage. Take over-the-counter and prescription medicines only as told by your health care provider. Eating and drinking What you eat and drink affects your blood glucose and your insulin dosage. Making good choices helps to control your diabetes and prevent other health problems. A healthy meal plan includes eating lean proteins, complex carbohydrates, fresh fruits and vegetables, low-fat dairy products, and healthy fats. Make an appointment to see a registered dietitian to help you create an eating plan that is right for you. Make sure that you: Follow instructions from your health care provider about eating or drinking  restrictions. Drink enough fluid to keep your urine pale yellow. Keep a record of the carbohydrates that you eat. Do this by reading food labels and learning the standard serving sizes of foods. Follow your sick-day plan whenever you cannot eat or drink as usual. Make this plan in advance with your health care provider.  Activity Stay active. Exercise regularly, as told by your health care provider. This may include: Stretching and doing strength exercises, such as yoga or weight lifting, two or more times a week. Doing 150 minutes or more of moderate-intensity or vigorous-intensity exercise each week. This could be brisk walking, biking, or water aerobics. Spread out your activity over 3 or more days of the week. Do not go more than 2 days in a row without doing some kind of physical activity. When you start a new exercise or activity, work with your health care provider to adjust your insulin, medicines, or food intake as needed. Lifestyle Do not use any products that contain nicotine or tobacco. These products include cigarettes, chewing tobacco, and vaping devices, such as e-cigarettes. If you need help quitting, ask your health care provider. If you drink alcohol and your health care provider says that it is safe for you: Limit how much you have to: 0-1 drink a day for women who are not pregnant. 0-2 drinks a day for men. Know how much alcohol is in your drink. In the [place] one drink equals  one 12 oz bottle of beer (355 mL), one 5 oz glass of wine (148 mL), or one 1 oz glass of hard liquor (44 mL). Learn to manage stress. If you need help with this, ask your health care provider. Take care of your body  Keep your immunizations up to date. In addition to getting vaccinations as told by your health care provider, it is recommended that you get vaccinated against the following illnesses: The flu (influenza). Get a flu shot every year. Pneumonia. Hepatitis B. Schedule an eye exam soon  after your diagnosis, and then one time every year after that. Check your skin and feet every day for cuts, bruises, redness, blisters, or sores. Schedule a foot exam with your health care provider once every year. Brush your teeth and gums two times a day, and floss one or more times a day. Visit your dentist one or more times every 6 months. Maintain a healthy weight. General instructions Share your diabetes management plan with people in your workplace, school, and household. Carry a medical alert card or wear medical alert jewelry. Keep all follow-up visits. This is important. Questions to ask your health care provider Should I meet with a certified diabetes care and education specialist? Where can I find a support group for people with diabetes? Where to find more information For help and guidance and for more information about diabetes, please visit: American Diabetes Association (ADA): www.diabetes.org American Association of Diabetes Care and Education Specialists (ADCES): www.diabeteseducator.org International Diabetes Federation (IDF): MemberVerification.ca Summary Caring for yourself after you have been diagnosed with type 2 diabetes (type 2 diabetes mellitus) means keeping your blood sugar (glucose) under control with a balance of nutrition, exercise, lifestyle changes, and medicine. Check your blood glucose every day, as often as told by your health care provider. Having diabetes can put you at risk for other long-term (chronic) conditions, such as heart disease and kidney disease. Your health care provider may prescribe medicines to help prevent complications from diabetes. Share your diabetes management plan with people in your workplace, school, and household. Keep all follow-up visits. This is important. This information is not intended to replace advice given to you by your health care provider. Make sure you discuss any questions you have with your health care provider. Document  Revised: 01/13/2021 Document Reviewed: 01/13/2021 Elsevier Patient Education  2022 [place],   Merri Ray, MD [facility] 10/20/21 2:26 PM

## 2021-10-20 NOTE — Patient Instructions (Addendum)
I do not see any sign of infection around the upper right tooth.  I am glad to hear that pain has improved today.  Please contact your dental provider to advise of that pain but be seen right away if you do have any worsening symptoms.  I do not see a reason for antibiotics at this time.   Fasting lab visit tomorrow, but follow up to discuss results in 2 weeks so we can adjust meds if needed. Bring record of home readings - fasting or 2 hours after meals to that visit. 1-2 readings per day.   If any new chest pains be seen right away. Keep follow up with cardiology and call Dr. Luther Parody office for a follow up appointment.   Return to the clinic or go to the nearest emergency room if any of your symptoms worsen or new symptoms occur.   Type 2 Diabetes Mellitus, Self-Care, Adult Caring for yourself after you have been diagnosed with type 2 diabetes (type 2 diabetes mellitus) means keeping your blood sugar (glucose) under control with a balance of: Nutrition. Exercise. Lifestyle changes. Medicines or insulin, if needed. Support from your team of health care providers and others. What are the risks? Having type 2 diabetes can put you at risk for other long-term (chronic) conditions, such as heart disease and kidney disease. Your health care provider may prescribe medicines to help prevent complications from diabetes. How to monitor your blood glucose  Check your blood glucose every day or as often as told by your health care provider. Have your A1C (hemoglobin A1C) level checked two or more times a year, or as often as told by your health care provider. Your health care provider will set personalized treatment goals for you. Generally, the goal of treatment is to maintain the following blood glucose levels: Before meals: 80-130 mg/dL (4.4-7.2 mmol/L). After meals: below 180 mg/dL (10 mmol/L). A1C level: less than 7%. How to manage hyperglycemia and hypoglycemia Hyperglycemia  symptoms Hyperglycemia, also called high blood glucose, occurs when blood glucose is too high. Make sure you know the early signs of hyperglycemia, such as: Increased thirst. Hunger. Feeling very tired. Needing to urinate more often than usual. Blurry vision. Hypoglycemia symptoms Hypoglycemia, also called low blood glucose, occurs with a blood glucose level at or below 70 mg/dL (3.9 mmol/L). Diabetes medicines lower your blood glucose and can cause hypoglycemia. The risk for hypoglycemia increases during or after exercise, during sleep, during illness, and when skipping meals or not eating for a long time (fasting). It is important to know the symptoms of hypoglycemia and treat it right away. Always have a 15-gram rapid-acting carbohydrate snack with you to treat low blood glucose. Family members and close friends should also know the symptoms and understand how to treat hypoglycemia, in case you are not able to treat yourself. Symptoms may include: Hunger. Anxiety. Sweating and feeling clammy. Dizziness or feeling light-headed. Sleepiness. Increased heart rate. Irritability. Tingling or numbness around the mouth, lips, or tongue. Restless sleep. Severe hypoglycemia is when your blood glucose level is at or below 54 mg/dL (3 mmol/L). Severe hypoglycemia is an emergency. Do not wait to see if the symptoms will go away. Get medical help right away. Call your local emergency services (911 in the [place] Do not drive yourself to the hospital. If you have severe hypoglycemia and you cannot eat or drink, you may need glucagon. A family member or close friend should learn how to check your blood glucose and how  to give you glucagon. Ask your health care provider if you need to have an emergency glucagon kit available. °Follow these instructions at home: °Medicines °Take prescribed insulin or diabetes medicines as told by your health care provider. °Do not run out of insulin or other diabetes medicines.  Plan ahead so you always have these available. °If you use insulin, adjust your dosage based on your physical activity and what foods you eat. Your health care provider will tell you how to adjust your dosage. °Take over-the-counter and prescription medicines only as told by your health care provider. °Eating and drinking °What you eat and drink affects your blood glucose and your insulin dosage. Making good choices helps to control your diabetes and prevent other health problems. A healthy meal plan includes eating lean proteins, complex carbohydrates, fresh fruits and vegetables, low-fat dairy products, and healthy fats. °Make an appointment to see a registered dietitian to help you create an eating plan that is right for you. Make sure that you: °Follow instructions from your health care provider about eating or drinking restrictions. °Drink enough fluid to keep your urine pale yellow. °Keep a record of the carbohydrates that you eat. Do this by reading food labels and learning the standard serving sizes of foods. °Follow your sick-day plan whenever you cannot eat or drink as usual. Make this plan in advance with your health care provider. ° °Activity °Stay active. Exercise regularly, as told by your health care provider. This may include: °Stretching and doing strength exercises, such as yoga or weight lifting, two or more times a week. °Doing 150 minutes or more of moderate-intensity or vigorous-intensity exercise each week. This could be brisk walking, biking, or water aerobics. °Spread out your activity over 3 or more days of the week. °Do not go more than 2 days in a row without doing some kind of physical activity. °When you start a new exercise or activity, work with your health care provider to adjust your insulin, medicines, or food intake as needed. °Lifestyle °Do not use any products that contain nicotine or tobacco. These products include cigarettes, chewing tobacco, and vaping devices, such as  e-cigarettes. If you need help quitting, ask your health care provider. °If you drink alcohol and your health care provider says that it is safe for you: °Limit how much you have to: °0-1 drink a day for women who are not pregnant. °0-2 drinks a day for men. °Know how much alcohol is in your drink. In the [place] one drink equals one 12 oz bottle of beer (355 mL), one 5 oz glass of wine (148 mL), or one 1½ oz glass of hard liquor (44 mL). °Learn to manage stress. If you need help with this, ask your health care provider. °Take care of your body ° °Keep your immunizations up to date. In addition to getting vaccinations as told by your health care provider, it is recommended that you get vaccinated against the following illnesses: °The flu (influenza). Get a flu shot every year. °Pneumonia. °Hepatitis B. °Schedule an eye exam soon after your diagnosis, and then one time every year after that. °Check your skin and feet every day for cuts, bruises, redness, blisters, or sores. Schedule a foot exam with your health care provider once every year. °Brush your teeth and gums two times a day, and floss one or more times a day. Visit your dentist one or more times every 6 months. °Maintain a healthy weight. °General instructions °Share your diabetes management plan with   people in your workplace, school, and household. Carry a medical alert card or wear medical alert jewelry. Keep all follow-up visits. This is important. Questions to ask your health care provider Should I meet with a certified diabetes care and education specialist? Where can I find a support group for people with diabetes? Where to find more information For help and guidance and for more information about diabetes, please visit: American Diabetes Association (ADA): www.diabetes.org American Association of Diabetes Care and Education Specialists (ADCES): www.diabeteseducator.org International Diabetes Federation (IDF): MemberVerification.ca Summary Caring for  yourself after you have been diagnosed with type 2 diabetes (type 2 diabetes mellitus) means keeping your blood sugar (glucose) under control with a balance of nutrition, exercise, lifestyle changes, and medicine. Check your blood glucose every day, as often as told by your health care provider. Having diabetes can put you at risk for other long-term (chronic) conditions, such as heart disease and kidney disease. Your health care provider may prescribe medicines to help prevent complications from diabetes. Share your diabetes management plan with people in your workplace, school, and household. Keep all follow-up visits. This is important. This information is not intended to replace advice given to you by your health care provider. Make sure you discuss any questions you have with your health care provider. Document Revised: 01/13/2021 Document Reviewed: 01/13/2021 [employer] Patient Education  [employer].

## 2021-10-21 ENCOUNTER — Other Ambulatory Visit (INDEPENDENT_AMBULATORY_CARE_PROVIDER_SITE_OTHER): Payer: 59

## 2021-10-21 ENCOUNTER — Other Ambulatory Visit: Payer: 59

## 2021-10-21 DIAGNOSIS — Z13220 Encounter for screening for lipoid disorders: Secondary | ICD-10-CM | POA: Diagnosis not present

## 2021-10-21 DIAGNOSIS — Z130 Encounter for screening for diseases of the blood and blood-forming organs and certain disorders involving the immune mechanism: Secondary | ICD-10-CM

## 2021-10-21 DIAGNOSIS — E1165 Type 2 diabetes mellitus with hyperglycemia: Secondary | ICD-10-CM | POA: Diagnosis not present

## 2021-10-21 LAB — CBC WITH DIFFERENTIAL/PLATELET
Basophils Absolute: 0 10*3/uL (ref 0.0–0.1)
Basophils Relative: 0.4 % (ref 0.0–3.0)
Eosinophils Absolute: 0.1 10*3/uL (ref 0.0–0.7)
Eosinophils Relative: 1.2 % (ref 0.0–5.0)
HCT: 51.3 % (ref 39.0–52.0)
Hemoglobin: 17.4 g/dL — ABNORMAL HIGH (ref 13.0–17.0)
Lymphocytes Relative: 30.6 % (ref 12.0–46.0)
Lymphs Abs: 1.7 10*3/uL (ref 0.7–4.0)
MCHC: 33.8 g/dL (ref 30.0–36.0)
MCV: 89 fl (ref 78.0–100.0)
Monocytes Absolute: 0.4 10*3/uL (ref 0.1–1.0)
Monocytes Relative: 7.1 % (ref 3.0–12.0)
Neutro Abs: 3.5 10*3/uL (ref 1.4–7.7)
Neutrophils Relative %: 60.7 % (ref 43.0–77.0)
Platelets: 205 10*3/uL (ref 150.0–400.0)
RBC: 5.77 Mil/uL (ref 4.22–5.81)
RDW: 13.2 % (ref 11.5–15.5)
WBC: 5.7 10*3/uL (ref 4.0–10.5)

## 2021-10-21 LAB — COMPREHENSIVE METABOLIC PANEL
ALT: 28 U/L (ref 0–53)
AST: 21 U/L (ref 0–37)
Albumin: 3.9 g/dL (ref 3.5–5.2)
Alkaline Phosphatase: 56 U/L (ref 39–117)
BUN: 17 mg/dL (ref 6–23)
CO2: 27 mEq/L (ref 19–32)
Calcium: 9.1 mg/dL (ref 8.4–10.5)
Chloride: 102 mEq/L (ref 96–112)
Creatinine, Ser: 0.97 mg/dL (ref 0.40–1.50)
GFR: 83.79 mL/min (ref >60.00)
Glucose, Bld: 178 mg/dL — ABNORMAL HIGH (ref 70–99)
Potassium: 3.9 mEq/L (ref 3.5–5.1)
Sodium: 135 mEq/L (ref 135–145)
Total Bilirubin: 0.7 mg/dL (ref 0.2–1.2)
Total Protein: 7.5 g/dL (ref 6.0–8.3)

## 2021-10-21 LAB — LIPID PANEL
Cholesterol: 241 mg/dL — ABNORMAL HIGH (ref 0–200)
HDL: 43.8 mg/dL (ref >39.00)
LDL Cholesterol: 165 mg/dL — ABNORMAL HIGH (ref 0–99)
NonHDL: 197.28
Total CHOL/HDL Ratio: 6
Triglycerides: 162 mg/dL — ABNORMAL HIGH (ref 0.0–149.0)
VLDL: 32.4 mg/dL (ref 0.0–40.0)

## 2021-10-21 LAB — MICROALBUMIN / CREATININE URINE RATIO
Creatinine,U: 100 mg/dL
Microalb Creat Ratio: 4.8 mg/g (ref 0.0–30.0)
Microalb, Ur: 4.8 mg/dL — ABNORMAL HIGH (ref 0.0–1.9)

## 2021-10-21 LAB — HEMOGLOBIN A1C: Hgb A1c MFr Bld: 8.4 % — ABNORMAL HIGH (ref 4.6–6.5)

## 2021-11-04 ENCOUNTER — Encounter: Payer: Self-pay | Admitting: Family Medicine

## 2021-11-04 ENCOUNTER — Ambulatory Visit (INDEPENDENT_AMBULATORY_CARE_PROVIDER_SITE_OTHER): Payer: 59 | Admitting: Family Medicine

## 2021-11-04 ENCOUNTER — Other Ambulatory Visit (HOSPITAL_COMMUNITY): Payer: Self-pay

## 2021-11-04 VITALS — BP 128/70 | HR 78 | Temp 98.1°F | Resp 16 | Ht 68.0 in | Wt 209.6 lb

## 2021-11-04 DIAGNOSIS — M7711 Lateral epicondylitis, right elbow: Secondary | ICD-10-CM

## 2021-11-04 DIAGNOSIS — E1165 Type 2 diabetes mellitus with hyperglycemia: Secondary | ICD-10-CM

## 2021-11-04 DIAGNOSIS — E785 Hyperlipidemia, unspecified: Secondary | ICD-10-CM

## 2021-11-04 MED ORDER — SEMAGLUTIDE (1 MG/DOSE) 4 MG/3ML ~~LOC~~ SOPN
1.0000 mg | PEN_INJECTOR | SUBCUTANEOUS | 3 refills | Status: DC
  Filled 2021-11-04: qty 3, 28d supply, fill #0

## 2021-11-04 NOTE — Progress Notes (Signed)
Subjective:  Patient ID: Troy Yu, male    DOB: 06/10/1961  Age: 61 y.o. MRN: [id number]  CC:  Chief Complaint  Patient presents with   Diabetes    Pt brought meter with him today reports 159 a few days ago, today after breakfast BG was 230    Arm Pain    Pt notes he has intermittent pain in his Rt elbow notes this has been long standing issue has been addressed previously but wanted to bring this back up     HPI Troy Yu presents for   Diabetes: With hyperglycemia, combined systolic, diastolic CHF with history of NSTEMI.  Follow-up for diabetes February 22, prior visit with me last year. Previously had recommended higher dose of Ozempic, some issues with having that filled.  Had remained on the 0.5 mg.  Weight had increased and less exercise few months prior to last visit.  Uncontrolled with A1c of 8.4.  Had been off Ozempic temporarily for 3 weeks prior to last visit but had restarted week prior to his visit on the 22nd. Plan for Optho follow-up.  He planned to schedule appointment. Allergy to metformin Current Ozempic dose 0.5 mg weekly. Home readings:  Fasting: 166, 154 Postprandial - about 230.  Hypoglycemia: none.    Lab Results  Component Value Date   HGBA1C 8.4 (H) 10/21/2021   HGBA1C 7.9 (H) 10/01/2020   HGBA1C 8.1 (H) 07/07/2020   Lab Results  Component Value Date   MICROALBUR 4.8 (H) 10/20/2021   LDLCALC 165 (H) 10/21/2021   CREATININE 0.97 10/21/2021  Took lipitor in past - off meds for past year at least.   Right elbow pain: On chart review it appears this was discussed with Dr. Durenda Age in 2019, pain with leaning on elbow.  5 years prior had a fall onto the elbow and pain since that time.  Right elbow x-ray on 06/25/2018 was negative. Same pain.  Intermittent pain. Not feeling currently. Last felt about a month ago.  Tingling in hand at times when has elbow pain.   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   Right leg pain 02/21/2016   Non-STEMI (non-ST elevated myocardial infarction) ([place]) 02/21/2016   LV (left ventricular) mural thrombus 02/21/2016   Elevated troponin 02/21/2016   Abnormal EKG 02/21/2016   Acute MI ([place]) 02/21/2016   History of embolectomy 02/21/2016   Diabetes ([place])    Past Medical History:  Diagnosis Date   Diabetes ([place])    Hypercholesteremia    LV (left ventricular) mural thrombus    NSTEMI (non-ST elevated myocardial infarction) [facility])    Past Surgical History:  Procedure Laterality Date   APPLICATION OF WOUND VAC Right 02/22/2016   Procedure: APPLICATION OF Negative Pressure Dressing Right Lower Leg;  Surgeon: Conrad [place], MD;  Location: [facility];  Service: Vascular;  Laterality: Right;   APPLICATION OF WOUND VAC Right 02/26/2016   Procedure: APPLICATION OF WOUND VAC, RIGHT MEDIAL LOWER LEG FASCIOTOMY SITE;  Surgeon: Rosetta Posner, MD;  Location: [facility];  Service: Vascular;  Laterality: Right;   CARDIAC CATHETERIZATION N/A 03/09/2016   Procedure: Coronary Stent Intervention;  Surgeon: Wellington Hampshire, MD;  Location: [facility];  Service: Cardiovascular;  Laterality: N/A;  distal RCA mid LAD   CARDIAC CATHETERIZATION N/A 03/09/2016   Procedure: Intravascular Pressure Wire/FFR Study;  Surgeon: Wellington Hampshire, MD;  Location: [facility];  Service: Cardiovascular;  Laterality: N/A;  mid LAD   CARDIAC CATHETERIZATION N/A 03/09/2016   Procedure: Left Heart Cath and Coronary Angiography;  Surgeon: Wellington Hampshire, MD;  Location: [facility];  Service: Cardiovascular;  Laterality: N/A;    EMBOLECTOMY Right 02/21/2016   Procedure: EMBOLECTOMY RIGHT COMMON FEMORAL ARTERY; FOUR COMPARTMENT FASCIOTOMY;  Surgeon: Conrad Morrison, MD;  Location: [facility];  Service: Vascular;  Laterality: Right;   FASCIOTOMY CLOSURE Right 02/26/2016   Procedure: RIGHT LATERAL LOWER LEG FASCIOTOMY CLOSURE;  Surgeon: Rosetta Posner, MD;  Location: [facility];  Service: Vascular;  Laterality: Right;   HEMATOMA EVACUATION Right 02/22/2016   Procedure: EVACUATION HEMATOMA With Placement of Negative Pressure Dressing;  Surgeon: Conrad [place], MD;  Location: [facility];  Service: Vascular;  Laterality: Right;   Allergies  Allergen Reactions   Metformin     Other reaction(s): Dizziness (intolerance)   Prior to Admission medications   Medication Sig Start Date End Date Taking? Authorizing Provider  atorvastatin (LIPITOR) 20 MG tablet Take 1 tablet (20 mg total) by mouth daily. Please make overdue appt with Dr. Johnsie Cancel before anymore refills. Thank you 3rd and Final Attempt 09/28/20  Yes Josue Hector, MD  carvedilol (COREG) 6.25 MG tablet Take 1 tablet (6.25 mg total) by mouth 2 (two) times daily with a meal. Please schedule appt for future refills. 1 attempt 08/27/20  Yes Josue Hector, MD  glucose blood (ONETOUCH VERIO) test strip 1 strip by In Vitro route 2 times daily. Dx: E11.9 06/16/20  Yes Hagler, Aaron Edelman, MD  OZEMPIC, 0.25 OR 0.5 MG/DOSE, 2 MG/1.5ML SOPN INJECT 0.5 MG INTO THE SKIN ONCE A WEEK. 10/15/21  Yes Wendie Agreste, MD  amoxicillin (AMOXIL) 500 MG capsule Take 1 capsule (500 mg total) by mouth 3 (three) times daily until finished Patient not taking: Reported on 10/20/2021 06/02/21     amoxicillin-clavulanate (AUGMENTIN) 875-125 MG tablet Take 1 tablet by mouth 2 (two) times daily. Patient not taking: Reported on 10/20/2021 10/01/20   Wendie Agreste, MD  atorvastatin (LIPITOR) 20 MG tablet TAKE 1 TABLET BY MOUTH ONCE A DAY (OVERDUE FOR OFFICE VISIT,3RD AND FINAL ATTEMPT) 09/28/20 09/28/21  Josue Hector, MD   atorvastatin (LIPITOR) 20 MG tablet TAKE 1 TABLET BY MOUTH ONCE A DAY (NEEDS OFFICE VISIT) 08/27/20 08/27/21  Josue Hector, MD  carvedilol (COREG) 6.25 MG tablet TAKE 1 TABLET (6.25 MG TOTAL) BY MOUTH 2 (TWO) TIMES DAILY WITH A MEAL. PLEASE SCHEDULE APPT FOR FUTURE REFILLS. 1 ATTEMPT 08/27/20 08/27/21  Josue Hector, MD  diclofenac Sodium (VOLTAREN) 1 % GEL Apply 2 g topically 4 (four) times daily. Patient not taking: Reported on 10/20/2021 04/24/20   Augusto Gamble B, NP  HYDROcodone-acetaminophen (NORCO/VICODIN) 5-325 MG tablet Take 1 tablet by mouth every 6 (six) hours as needed for moderate pain. Patient not taking: Reported on 10/20/2021 10/01/20   Wendie Agreste, MD   Social History   Socioeconomic History   Marital status: Married    Spouse name: [name]   Number of children: 5  Years of education: COLLEGE   Highest education level: Not on file  Occupational History   Occupation: UNEMPLOYED  Tobacco Use   Smoking status: Never   Smokeless tobacco: Never  Vaping Use   Vaping Use: Never used  Substance and Sexual Activity   Alcohol use: No   Drug use: No   Sexual activity: Not on file  Other Topics Concern   Not on file  Social History Narrative   Not on file   Social Determinants of Health   Financial Resource Strain: Not on file  Food Insecurity: Not on file  Transportation Needs: Not on file  Physical Activity: Not on file  Stress: Not on file  Social Connections: Not on file  Intimate Partner Violence: Not on file    Review of Systems Per HPI.   Objective:   Vitals:   11/04/21 1548  BP: 128/70  Pulse: 78  Resp: 16  Temp: 98.1 F (36.7 C)  TempSrc: Temporal  SpO2: 97%  Weight: 209 lb 9.6 oz (95.1 kg)  Height: '5\' 8"'  (1.727 m)     Physical Exam Vitals reviewed.  Constitutional:      Appearance: He is well-developed.  HENT:     Head: Normocephalic and atraumatic.  Neck:     Vascular: No carotid bruit or JVD.  Cardiovascular:     Rate and  Rhythm: Normal rate and regular rhythm.     Heart sounds: Normal heart sounds. No murmur heard. Pulmonary:     Effort: Pulmonary effort is normal.     Breath sounds: Normal breath sounds. No rales.  Musculoskeletal:     Right lower leg: No edema.     Left lower leg: No edema.     Comments: R elbow:  Nontender, pain free rom. Locates area of prior pain at lateral epicondyle.  NVI distally.  Intact grip strength, no pain with resisted wrist extension.   Skin:    General: Skin is warm and dry.  Neurological:     Mental Status: He is alert and oriented to person, place, and time.  Psychiatric:        Mood and Affect: Mood normal.       Assessment & Plan:  Linzie AL Darden Palmer Londell Moh is a 61 y.o. male . Type 2 diabetes mellitus with hyperglycemia, without long-term current use of insulin (HCC) - Plan: Semaglutide, 1 MG/DOSE, 4 MG/3ML SOPN  - still decreased control on ozempic - increase dose to 80m, home monitoring and recheck in 2 months. Hypoglycemia precautions.   Hyperlipidemia, unspecified hyperlipidemia type  - restart Lipitor - discussed need for ongoing use with hx of HLD and cardiac disease.   Lateral epicondylitis of right elbow  - based on location. Asymptomatic at present. Avoidance of carrying technique that may exacerbate (does carry bags in hand at times). Handout given, option of otc counterforce splint, rtc if pain returns. Option of eccentric strengthening if recurrent.   Meds ordered this encounter  Medications   Semaglutide, 1 MG/DOSE, 4 MG/3ML SOPN    Sig: Inject 1 mg as directed once a week.    Dispense:  3 mL    Refill:  3   Patient Instructions  Diabetes test and home readings are still too high.  Increase Ozempic to 1 mg dose once per week.  If any low blood sugar readings at higher dose let me know but I suspect we will probably have to go higher.  See information below on blood sugar goals.  Restart Lipitor  once per day.  We can recheck your cholesterol  levels as well as some of the other borderline labs at your next visit along with the diabetes test.  Elbow pain is likely tennis elbow or lateral epicondylitis.  See information below.  Try to avoid carrying bags with palms down.  Okay to cradle as we discussed but not carry with palm down.  Can try over-the-counter tennis elbow brace if needed but follow-up if that pain returns.   Type 2 Diabetes Mellitus, Self-Care, Adult Caring for yourself after you have been diagnosed with type 2 diabetes (type 2 diabetes mellitus) means keeping your blood sugar (glucose) under control with a balance of: Nutrition. Exercise. Lifestyle changes. Medicines or insulin, if needed. Support from your team of health care providers and others. What are the risks? Having type 2 diabetes can put you at risk for other long-term (chronic) conditions, such as heart disease and kidney disease. Your health care provider may prescribe medicines to help prevent complications from diabetes. How to monitor your blood glucose  Check your blood glucose every day or as often as told by your health care provider. Have your A1C (hemoglobin A1C) level checked two or more times a year, or as often as told by your health care provider. Your health care provider will set personalized treatment goals for you. Generally, the goal of treatment is to maintain the following blood glucose levels: Before meals: 80-130 mg/dL (4.4-7.2 mmol/L). After meals: below 180 mg/dL (10 mmol/L). A1C level: less than 7%. How to manage hyperglycemia and hypoglycemia Hyperglycemia symptoms Hyperglycemia, also called high blood glucose, occurs when blood glucose is too high. Make sure you know the early signs of hyperglycemia, such as: Increased thirst. Hunger. Feeling very tired. Needing to urinate more often than usual. Blurry vision. Hypoglycemia symptoms Hypoglycemia, also called low blood glucose, occurs with a blood glucose level at or below  70 mg/dL (3.9 mmol/L). Diabetes medicines lower your blood glucose and can cause hypoglycemia. The risk for hypoglycemia increases during or after exercise, during sleep, during illness, and when skipping meals or not eating for a long time (fasting). It is important to know the symptoms of hypoglycemia and treat it right away. Always have a 15-gram rapid-acting carbohydrate snack with you to treat low blood glucose. Family members and close friends should also know the symptoms and understand how to treat hypoglycemia, in case you are not able to treat yourself. Symptoms may include: Hunger. Anxiety. Sweating and feeling clammy. Dizziness or feeling light-headed. Sleepiness. Increased heart rate. Irritability. Tingling or numbness around the mouth, lips, or tongue. Restless sleep. Severe hypoglycemia is when your blood glucose level is at or below 54 mg/dL (3 mmol/L). Severe hypoglycemia is an emergency. Do not wait to see if the symptoms will go away. Get medical help right away. Call your local emergency services (911 in the [place] Do not drive yourself to the hospital. If you have severe hypoglycemia and you cannot eat or drink, you may need glucagon. A family member or close friend should learn how to check your blood glucose and how to give you glucagon. Ask your health care provider if you need to have an emergency glucagon kit available. Follow these instructions at home: Medicines Take prescribed insulin or diabetes medicines as told by your health care provider. Do not run out of insulin or other diabetes medicines. Plan ahead so you always have these available. If you use insulin, adjust your dosage based on your physical activity and  what foods you eat. Your health care provider will tell you how to adjust your dosage. Take over-the-counter and prescription medicines only as told by your health care provider. Eating and drinking What you eat and drink affects your blood glucose and  your insulin dosage. Making good choices helps to control your diabetes and prevent other health problems. A healthy meal plan includes eating lean proteins, complex carbohydrates, fresh fruits and vegetables, low-fat dairy products, and healthy fats. Make an appointment to see a registered dietitian to help you create an eating plan that is right for you. Make sure that you: Follow instructions from your health care provider about eating or drinking restrictions. Drink enough fluid to keep your urine pale yellow. Keep a record of the carbohydrates that you eat. Do this by reading food labels and learning the standard serving sizes of foods. Follow your sick-day plan whenever you cannot eat or drink as usual. Make this plan in advance with your health care provider.  Activity Stay active. Exercise regularly, as told by your health care provider. This may include: Stretching and doing strength exercises, such as yoga or weight lifting, two or more times a week. Doing 150 minutes or more of moderate-intensity or vigorous-intensity exercise each week. This could be brisk walking, biking, or water aerobics. Spread out your activity over 3 or more days of the week. Do not go more than 2 days in a row without doing some kind of physical activity. When you start a new exercise or activity, work with your health care provider to adjust your insulin, medicines, or food intake as needed. Lifestyle Do not use any products that contain nicotine or tobacco. These products include cigarettes, chewing tobacco, and vaping devices, such as e-cigarettes. If you need help quitting, ask your health care provider. If you drink alcohol and your health care provider says that it is safe for you: Limit how much you have to: 0-1 drink a day for women who are not pregnant. 0-2 drinks a day for men. Know how much alcohol is in your drink. In the [place] one drink equals one 12 oz bottle of beer (355 mL), one 5 oz glass of  wine (148 mL), or one 1 oz glass of hard liquor (44 mL). Learn to manage stress. If you need help with this, ask your health care provider. Take care of your body  Keep your immunizations up to date. In addition to getting vaccinations as told by your health care provider, it is recommended that you get vaccinated against the following illnesses: The flu (influenza). Get a flu shot every year. Pneumonia. Hepatitis B. Schedule an eye exam soon after your diagnosis, and then one time every year after that. Check your skin and feet every day for cuts, bruises, redness, blisters, or sores. Schedule a foot exam with your health care provider once every year. Brush your teeth and gums two times a day, and floss one or more times a day. Visit your dentist one or more times every 6 months. Maintain a healthy weight. General instructions Share your diabetes management plan with people in your workplace, school, and household. Carry a medical alert card or wear medical alert jewelry. Keep all follow-up visits. This is important. Questions to ask your health care provider Should I meet with a certified diabetes care and education specialist? Where can I find a support group for people with diabetes? Where to find more information For help and guidance and for more information about diabetes, please  visit: American Diabetes Association (ADA): www.diabetes.org American Association of Diabetes Care and Education Specialists (ADCES): www.diabeteseducator.org International Diabetes Federation (IDF): MemberVerification.ca Summary Caring for yourself after you have been diagnosed with type 2 diabetes (type 2 diabetes mellitus) means keeping your blood sugar (glucose) under control with a balance of nutrition, exercise, lifestyle changes, and medicine. Check your blood glucose every day, as often as told by your health care provider. Having diabetes can put you at risk for other long-term (chronic) conditions, such  as heart disease and kidney disease. Your health care provider may prescribe medicines to help prevent complications from diabetes. Share your diabetes management plan with people in your workplace, school, and household. Keep all follow-up visits. This is important. This information is not intended to replace advice given to you by your health care provider. Make sure you discuss any questions you have with your health care provider. Document Revised: 01/13/2021 Document Reviewed: 01/13/2021 Elsevier Patient Education  [place].   Tennis Elbow Tennis elbow (lateral epicondylitis) is inflammation of tendons in your outer forearm, near your elbow. Tendons are tissues that connect muscle to bone. When you have tennis elbow, inflammation affects the tendons that you use to bend your wrist and move your hand up. Inflammation occurs in the lower part of the upper arm bone (humerus), where the tendons connect to the bone (lateral epicondyle). Tennis elbow often affects people who play tennis, but anyone may get the condition from repeatedly extending the wrist or turning the forearm. What are the causes? This condition is usually caused by repeatedly extending the wrist, turning the forearm, and using the hands. It can result from sports or work that requires repetitive forearm movements. In some cases, it may be caused by a sudden injury. What increases the risk? You are more likely to develop tennis elbow if you play tennis or another racket sport. You also have a higher risk if you frequently use your hands for work. Besides people who play tennis, others at greater risk include: People who use computers. Architect workers. People who work in [employer]. Musicians. Cooks. Cashiers. What are the signs or symptoms? Symptoms of this condition include: Pain and tenderness in the forearm and the outer part of the elbow. Pain may be felt only when using the arm, or it may be there all the  time. A burning feeling that starts in the elbow and spreads down the forearm. A weak grip in the hand. How is this diagnosed? This condition is diagnosed based on your symptoms, your medical history, and a physical exam. You may also have X-rays or an MRI to: Confirm the diagnosis. Look for other issues. Check for tears in the ligaments, muscles, or tendons. How is this treated? Resting and icing your arm is often the first treatment. Your health care provider may also recommend: Medicines to reduce pain and inflammation. These may be in the form of a pill, topical gels, or shots of a steroid medicine (cortisone). An elbow strap to reduce stress on the area. Physical therapy. This may include massage or exercises or both. An elbow brace to restrict the movements that cause symptoms. If these treatments do not help relieve your symptoms, your health care provider may recommend surgery to remove damaged muscle and reattach healthy muscle to bone. Follow these instructions at home: If you have a brace or strap: Wear the brace or strap as told by your health care provider. Remove it only as told by your health care provider. Check  the skin around the brace or strap every day. Tell your health care provider about any concerns. Loosen the brace if your fingers tingle, become numb, or turn cold and blue. Keep the brace clean. If the brace or strap is not waterproof: Do not let it get wet. Cover it with a watertight covering when you take a bath or a shower. Managing pain, stiffness, and swelling  If directed, put ice on the injured area. To do this: If you have a removable brace or strap, remove it as told by your health care provider. Put ice in a plastic bag. Place a towel between your skin and the bag. Leave the ice on for 20 minutes, 2-3 times a day. Remove the ice if your skin turns bright red. This is very important. If you cannot feel pain, heat, or cold, you have a greater risk of  damage to the area. Move your fingers often to reduce stiffness and swelling. Activity Rest your elbow and wrist and avoid activities that cause symptoms as told by your health care provider. Do physical therapy exercises as told by your health care provider. If you lift an object, lift it with your palm facing up. This reduces stress on your elbow. Lifestyle If your tennis elbow is caused by sports, check your equipment and make sure that: You use it correctly. It is good match for you. If your tennis elbow is caused by work or computer use, take frequent breaks to stretch your arm. Talk with your employer about ways to manage your condition at work. General instructions Take over-the-counter and prescription medicines only as told by your health care provider. Do not use any products that contain nicotine or tobacco. These products include cigarettes, chewing tobacco, and vaping devices, such as e-cigarettes. If you need help quitting, ask your health care provider. Keep all follow-up visits. This is important. How is this prevented? Before and after activity: Warm up and stretch before being active. Cool down and stretch after being active. Give your body time to rest between periods of activity. During activity: Make sure to use equipment that fits you. If you play tennis, put power in your stroke with your lower body. Avoid using your arm only. Maintain physical fitness, including: Strength. Flexibility. Endurance. Do exercises to strengthen the forearm muscles. Contact a health care provider if: You have pain that gets worse or does not get better with treatment. You have numbness or weakness in your forearm, hand, or fingers. Get help right away if: Your pain is severe. You cannot move your wrist. Summary Tennis elbow (lateral epicondylitis) is inflammation of tendons in your outer forearm, near your elbow. Common symptoms include pain and tenderness in your forearm and the  outer part of your elbow. This condition is usually caused by repeatedly extending your wrist, turning your forearm, and using your hands. The first treatment is often resting and icing your arm to relieve symptoms. Further treatment may include taking medicine, getting physical therapy, wearing a brace or strap, or having surgery. This information is not intended to replace advice given to you by your health care provider. Make sure you discuss any questions you have with your health care provider. Document Revised: 02/25/2020 Document Reviewed: 02/25/2020 Elsevier Patient Education  2022 [place],   Merri Ray, MD [place], [facility] 11/04/21 6:04 PM

## 2021-11-04 NOTE — Patient Instructions (Addendum)
Diabetes test and home readings are still too high.  Increase Ozempic to 1 mg dose once per week.  If any low blood sugar readings at higher dose let me know but I suspect we will probably have to go higher.  See information below on blood sugar goals.  Restart Lipitor once per day.  We can recheck your cholesterol levels as well as some of the other borderline labs at your next visit along with the diabetes test.  Elbow pain is likely tennis elbow or lateral epicondylitis.  See information below.  Try to avoid carrying bags with palms down.  Okay to cradle as we discussed but not carry with palm down.  Can try over-the-counter tennis elbow brace if needed but follow-up if that pain returns.   Type 2 Diabetes Mellitus, Self-Care, Adult Caring for yourself after you have been diagnosed with type 2 diabetes (type 2 diabetes mellitus) means keeping your blood sugar (glucose) under control with a balance of: Nutrition. Exercise. Lifestyle changes. Medicines or insulin, if needed. Support from your team of health care providers and others. What are the risks? Having type 2 diabetes can put you at risk for other long-term (chronic) conditions, such as heart disease and kidney disease. Your health care provider may prescribe medicines to help prevent complications from diabetes. How to monitor your blood glucose  Check your blood glucose every day or as often as told by your health care provider. Have your A1C (hemoglobin A1C) level checked two or more times a year, or as often as told by your health care provider. Your health care provider will set personalized treatment goals for you. Generally, the goal of treatment is to maintain the following blood glucose levels: Before meals: 80-130 mg/dL (4.4-7.2 mmol/L). After meals: below 180 mg/dL (10 mmol/L). A1C level: less than 7%. How to manage hyperglycemia and hypoglycemia Hyperglycemia symptoms Hyperglycemia, also called high blood glucose, occurs  when blood glucose is too high. Make sure you know the early signs of hyperglycemia, such as: Increased thirst. Hunger. Feeling very tired. Needing to urinate more often than usual. Blurry vision. Hypoglycemia symptoms Hypoglycemia, also called low blood glucose, occurs with a blood glucose level at or below 70 mg/dL (3.9 mmol/L). Diabetes medicines lower your blood glucose and can cause hypoglycemia. The risk for hypoglycemia increases during or after exercise, during sleep, during illness, and when skipping meals or not eating for a long time (fasting). It is important to know the symptoms of hypoglycemia and treat it right away. Always have a 15-gram rapid-acting carbohydrate snack with you to treat low blood glucose. Family members and close friends should also know the symptoms and understand how to treat hypoglycemia, in case you are not able to treat yourself. Symptoms may include: Hunger. Anxiety. Sweating and feeling clammy. Dizziness or feeling light-headed. Sleepiness. Increased heart rate. Irritability. Tingling or numbness around the mouth, lips, or tongue. Restless sleep. Severe hypoglycemia is when your blood glucose level is at or below 54 mg/dL (3 mmol/L). Severe hypoglycemia is an emergency. Do not wait to see if the symptoms will go away. Get medical help right away. Call your local emergency services (911 in the [place] Do not drive yourself to the hospital. If you have severe hypoglycemia and you cannot eat or drink, you may need glucagon. A family member or close friend should learn how to check your blood glucose and how to give you glucagon. Ask your health care provider if you need to have an emergency glucagon  kit available. Follow these instructions at home: Medicines Take prescribed insulin or diabetes medicines as told by your health care provider. Do not run out of insulin or other diabetes medicines. Plan ahead so you always have these available. If you use  insulin, adjust your dosage based on your physical activity and what foods you eat. Your health care provider will tell you how to adjust your dosage. Take over-the-counter and prescription medicines only as told by your health care provider. Eating and drinking What you eat and drink affects your blood glucose and your insulin dosage. Making good choices helps to control your diabetes and prevent other health problems. A healthy meal plan includes eating lean proteins, complex carbohydrates, fresh fruits and vegetables, low-fat dairy products, and healthy fats. Make an appointment to see a registered dietitian to help you create an eating plan that is right for you. Make sure that you: Follow instructions from your health care provider about eating or drinking restrictions. Drink enough fluid to keep your urine pale yellow. Keep a record of the carbohydrates that you eat. Do this by reading food labels and learning the standard serving sizes of foods. Follow your sick-day plan whenever you cannot eat or drink as usual. Make this plan in advance with your health care provider.  Activity Stay active. Exercise regularly, as told by your health care provider. This may include: Stretching and doing strength exercises, such as yoga or weight lifting, two or more times a week. Doing 150 minutes or more of moderate-intensity or vigorous-intensity exercise each week. This could be brisk walking, biking, or water aerobics. Spread out your activity over 3 or more days of the week. Do not go more than 2 days in a row without doing some kind of physical activity. When you start a new exercise or activity, work with your health care provider to adjust your insulin, medicines, or food intake as needed. Lifestyle Do not use any products that contain nicotine or tobacco. These products include cigarettes, chewing tobacco, and vaping devices, such as e-cigarettes. If you need help quitting, ask your health care  provider. If you drink alcohol and your health care provider says that it is safe for you: Limit how much you have to: 0-1 drink a day for women who are not pregnant. 0-2 drinks a day for men. Know how much alcohol is in your drink. In the [place] one drink equals one 12 oz bottle of beer (355 mL), one 5 oz glass of wine (148 mL), or one 1 oz glass of hard liquor (44 mL). Learn to manage stress. If you need help with this, ask your health care provider. Take care of your body  Keep your immunizations up to date. In addition to getting vaccinations as told by your health care provider, it is recommended that you get vaccinated against the following illnesses: The flu (influenza). Get a flu shot every year. Pneumonia. Hepatitis B. Schedule an eye exam soon after your diagnosis, and then one time every year after that. Check your skin and feet every day for cuts, bruises, redness, blisters, or sores. Schedule a foot exam with your health care provider once every year. Brush your teeth and gums two times a day, and floss one or more times a day. Visit your dentist one or more times every 6 months. Maintain a healthy weight. General instructions Share your diabetes management plan with people in your workplace, school, and household. Carry a medical alert card or wear medical alert jewelry.  Keep all follow-up visits. This is important. Questions to ask your health care provider Should I meet with a certified diabetes care and education specialist? Where can I find a support group for people with diabetes? Where to find more information For help and guidance and for more information about diabetes, please visit: American Diabetes Association (ADA): www.diabetes.org American Association of Diabetes Care and Education Specialists (ADCES): www.diabeteseducator.org International Diabetes Federation (IDF): MemberVerification.ca Summary Caring for yourself after you have been diagnosed with type 2 diabetes  (type 2 diabetes mellitus) means keeping your blood sugar (glucose) under control with a balance of nutrition, exercise, lifestyle changes, and medicine. Check your blood glucose every day, as often as told by your health care provider. Having diabetes can put you at risk for other long-term (chronic) conditions, such as heart disease and kidney disease. Your health care provider may prescribe medicines to help prevent complications from diabetes. Share your diabetes management plan with people in your workplace, school, and household. Keep all follow-up visits. This is important. This information is not intended to replace advice given to you by your health care provider. Make sure you discuss any questions you have with your health care provider. Document Revised: 01/13/2021 Document Reviewed: 01/13/2021 Elsevier Patient Education  [place].   Tennis Elbow Tennis elbow (lateral epicondylitis) is inflammation of tendons in your outer forearm, near your elbow. Tendons are tissues that connect muscle to bone. When you have tennis elbow, inflammation affects the tendons that you use to bend your wrist and move your hand up. Inflammation occurs in the lower part of the upper arm bone (humerus), where the tendons connect to the bone (lateral epicondyle). Tennis elbow often affects people who play tennis, but anyone may get the condition from repeatedly extending the wrist or turning the forearm. What are the causes? This condition is usually caused by repeatedly extending the wrist, turning the forearm, and using the hands. It can result from sports or work that requires repetitive forearm movements. In some cases, it may be caused by a sudden injury. What increases the risk? You are more likely to develop tennis elbow if you play tennis or another racket sport. You also have a higher risk if you frequently use your hands for work. Besides people who play tennis, others at greater risk  include: People who use computers. Architect workers. People who work in [employer]. Musicians. Cooks. Cashiers. What are the signs or symptoms? Symptoms of this condition include: Pain and tenderness in the forearm and the outer part of the elbow. Pain may be felt only when using the arm, or it may be there all the time. A burning feeling that starts in the elbow and spreads down the forearm. A weak grip in the hand. How is this diagnosed? This condition is diagnosed based on your symptoms, your medical history, and a physical exam. You may also have X-rays or an MRI to: Confirm the diagnosis. Look for other issues. Check for tears in the ligaments, muscles, or tendons. How is this treated? Resting and icing your arm is often the first treatment. Your health care provider may also recommend: Medicines to reduce pain and inflammation. These may be in the form of a pill, topical gels, or shots of a steroid medicine (cortisone). An elbow strap to reduce stress on the area. Physical therapy. This may include massage or exercises or both. An elbow brace to restrict the movements that cause symptoms. If these treatments do not help relieve your symptoms,  your health care provider may recommend surgery to remove damaged muscle and reattach healthy muscle to bone. Follow these instructions at home: If you have a brace or strap: Wear the brace or strap as told by your health care provider. Remove it only as told by your health care provider. Check the skin around the brace or strap every day. Tell your health care provider about any concerns. Loosen the brace if your fingers tingle, become numb, or turn cold and blue. Keep the brace clean. If the brace or strap is not waterproof: Do not let it get wet. Cover it with a watertight covering when you take a bath or a shower. Managing pain, stiffness, and swelling  If directed, put ice on the injured area. To do this: If you have a  removable brace or strap, remove it as told by your health care provider. Put ice in a plastic bag. Place a towel between your skin and the bag. Leave the ice on for 20 minutes, 2-3 times a day. Remove the ice if your skin turns bright red. This is very important. If you cannot feel pain, heat, or cold, you have a greater risk of damage to the area. Move your fingers often to reduce stiffness and swelling. Activity Rest your elbow and wrist and avoid activities that cause symptoms as told by your health care provider. Do physical therapy exercises as told by your health care provider. If you lift an object, lift it with your palm facing up. This reduces stress on your elbow. Lifestyle If your tennis elbow is caused by sports, check your equipment and make sure that: You use it correctly. It is good match for you. If your tennis elbow is caused by work or computer use, take frequent breaks to stretch your arm. Talk with your employer about ways to manage your condition at work. General instructions Take over-the-counter and prescription medicines only as told by your health care provider. Do not use any products that contain nicotine or tobacco. These products include cigarettes, chewing tobacco, and vaping devices, such as e-cigarettes. If you need help quitting, ask your health care provider. Keep all follow-up visits. This is important. How is this prevented? Before and after activity: Warm up and stretch before being active. Cool down and stretch after being active. Give your body time to rest between periods of activity. During activity: Make sure to use equipment that fits you. If you play tennis, put power in your stroke with your lower body. Avoid using your arm only. Maintain physical fitness, including: Strength. Flexibility. Endurance. Do exercises to strengthen the forearm muscles. Contact a health care provider if: You have pain that gets worse or does not get better with  treatment. You have numbness or weakness in your forearm, hand, or fingers. Get help right away if: Your pain is severe. You cannot move your wrist. Summary Tennis elbow (lateral epicondylitis) is inflammation of tendons in your outer forearm, near your elbow. Common symptoms include pain and tenderness in your forearm and the outer part of your elbow. This condition is usually caused by repeatedly extending your wrist, turning your forearm, and using your hands. The first treatment is often resting and icing your arm to relieve symptoms. Further treatment may include taking medicine, getting physical therapy, wearing a brace or strap, or having surgery. This information is not intended to replace advice given to you by your health care provider. Make sure you discuss any questions you have with your health care  provider. Document Revised: 02/25/2020 Document Reviewed: 02/25/2020 Elsevier Patient Education  [place].

## 2021-11-08 ENCOUNTER — Other Ambulatory Visit (HOSPITAL_COMMUNITY): Payer: Self-pay

## 2021-11-08 ENCOUNTER — Telehealth: Payer: Self-pay | Admitting: Family Medicine

## 2021-11-08 DIAGNOSIS — E785 Hyperlipidemia, unspecified: Secondary | ICD-10-CM

## 2021-11-08 MED ORDER — ATORVASTATIN CALCIUM 20 MG PO TABS
20.0000 mg | ORAL_TABLET | Freq: Every day | ORAL | 1 refills | Status: DC
  Filled 2021-11-08: qty 90, 90d supply, fill #0
  Filled 2022-02-11: qty 90, 90d supply, fill #1

## 2021-11-08 NOTE — Telephone Encounter (Signed)
Pt called in asking for a refill on the lipitor, he needs this to go to the [facility] outpt pharmacy. ? ? ?

## 2021-11-08 NOTE — Telephone Encounter (Signed)
Refill sent, discussed at his last visit.  ?

## 2021-11-12 ENCOUNTER — Other Ambulatory Visit (HOSPITAL_COMMUNITY): Payer: Self-pay

## 2021-11-12 NOTE — Progress Notes (Deleted)
? ? ? ?CARDIOLOGY OFFICE NOTE ? ?Date:  11/12/2021  ? ? ?Troy Yu ?Date of Birth: 04/04/1961 ?Medical Record #[id number] ? ?PCP:  Shade Flood, MD  ?Cardiologist:  Eden Emms ? ?No chief complaint on file. ? ? ?History of Present Illness: ?61 y.o. has not been seen in about 3 years Initially seen in ER June 2017 with subacute anterior MI complicated by distal embolization to right leg. Had a complicated RLE embolectomy requiring fasciotomy by Dr Imogene Burn with long healing process including wound Vac.  Had DES to distal RCA and mid LAD with EF 30-35% large apical thrombus. Diagnosed with DM Has refused a lot of meds and care.  ? ?Seen by Dr Arbie Cookey June 2019 told to stay on life long coumadin However this was ?Stopped 11/06/19 he had peripheral embolus due to LV apical clot in setting of acute MI so extended anticoagulation not indicated  ? ?"I think I'm doing ok and don't want to take unnecessary medication" ? ?80 yo daughter and 47 yo son living with him ?Ex wife went back to [place] ? 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 ?Last TTE 12/19/17 EF 35-40% old anterior MI with mild AR  ? ?*** ? ?Past Medical History:  ?Diagnosis Date  ? Diabetes (HCC)   ? Hypercholesteremia   ? LV (left ventricular) mural thrombus   ? NSTEMI (non-ST elevated myocardial infarction) (HCC)   ? ? ?Past Surgical History:  ?Procedure Laterality Date  ? APPLICATION OF WOUND VAC Right 02/22/2016  ? Procedure: APPLICATION OF Negative Pressure Dressing Right Lower Leg;  Surgeon: Fransisco Hertz, MD;  Location: [facility] OR;  Service: Vascular;  Laterality: Right;  ? APPLICATION OF WOUND VAC Right 02/26/2016  ? Procedure: APPLICATION OF WOUND VAC, RIGHT MEDIAL LOWER LEG FASCIOTOMY SITE;  Surgeon: Larina Earthly, MD;  Location: [facility] OR;  Service: Vascular;  Laterality: Right;  ? CARDIAC CATHETERIZATION N/A 03/09/2016  ? Procedure: Coronary Stent Intervention;  Surgeon: Iran Ouch, MD;  Location: [facility]  INVASIVE CV LAB;  Service: Cardiovascular;  Laterality: N/A;  distal RCA ?mid LAD  ? CARDIAC CATHETERIZATION N/A 03/09/2016  ? Procedure: Intravascular Pressure Wire/FFR Study;  Surgeon: Iran Ouch, MD;  Location: [facility] INVASIVE CV LAB;  Service: Cardiovascular;  Laterality: N/A;  mid LAD  ? CARDIAC CATHETERIZATION N/A 03/09/2016  ? Procedure: Left Heart Cath and Coronary Angiography;  Surgeon: Iran Ouch, MD;  Location: [facility] INVASIVE CV LAB;  Service: Cardiovascular;  Laterality: N/A;  ? EMBOLECTOMY Right 02/21/2016  ? Procedure: EMBOLECTOMY RIGHT COMMON FEMORAL ARTERY; FOUR COMPARTMENT FASCIOTOMY;  Surgeon: Fransisco Hertz, MD;  Location: [facility] OR;  Service: Vascular;  Laterality: Right;  ? FASCIOTOMY CLOSURE Right 02/26/2016  ? Procedure: RIGHT LATERAL LOWER LEG FASCIOTOMY CLOSURE;  Surgeon: Larina Earthly, MD;  Location: [facility] OR;  Service: Vascular;  Laterality: Right;  ? HEMATOMA EVACUATION Right 02/22/2016  ? Procedure: EVACUATION HEMATOMA With Placement of Negative Pressure Dressing;  Surgeon: Fransisco Hertz, MD;  Location: [facility] OR;  Service: Vascular;  Laterality: Right;  ? ? ? ?Medications: ?Current Outpatient Medications  ?Medication Sig Dispense Refill  ? amoxicillin (AMOXIL) 500 MG capsule Take 1 capsule (500 mg total) by mouth 3 (three) times daily until finished (Patient not taking: Reported on 10/20/2021) 21 capsule 0  ? amoxicillin-clavulanate (AUGMENTIN) 875-125 MG tablet Take 1 tablet by mouth 2 (two) times daily. (Patient not taking:  Reported on 10/20/2021) 20 tablet 0  ? atorvastatin (LIPITOR) 20 MG tablet Take 1 tablet (20 mg total) by mouth daily. 90 tablet 1  ? carvedilol (COREG) 6.25 MG tablet Take 1 tablet (6.25 mg total) by mouth 2 (two) times daily with a meal. Please schedule appt for future refills. 1 attempt 60 tablet 0  ? carvedilol (COREG) 6.25 MG tablet TAKE 1 TABLET (6.25 MG TOTAL) BY MOUTH 2 (TWO) TIMES DAILY WITH A MEAL. PLEASE SCHEDULE APPT FOR FUTURE REFILLS. 1 ATTEMPT 60 tablet 0  ?  diclofenac Sodium (VOLTAREN) 1 % GEL Apply 2 g topically 4 (four) times daily. (Patient not taking: Reported on 10/20/2021) 350 g 0  ? glucose blood (ONETOUCH VERIO) test strip 1 strip by In Vitro route 2 times daily. Dx: E11.9 100 each 0  ? HYDROcodone-acetaminophen (NORCO/VICODIN) 5-325 MG tablet Take 1 tablet by mouth every 6 (six) hours as needed for moderate pain. (Patient not taking: Reported on 10/20/2021) 20 tablet 0  ? Semaglutide, 1 MG/DOSE, 4 MG/3ML SOPN Inject 1 mg as directed once a week. 3 mL 3  ? ?No current facility-administered medications for this visit.  ? ? ?Allergies: ?Allergies  ?Allergen Reactions  ? Metformin   ?  Other reaction(s): Dizziness (intolerance)  ? ? ?Social History: ?The patient  reports that he has never smoked. He has never used smokeless tobacco. He reports that he does not drink alcohol and does not use drugs. ?  ?Family History: ?The patient's family history includes Heart Problems in his father and mother.  ? ?Review of Systems: ?Please see the history of present illness.   Otherwise, the review of systems is positive for none.   All other systems are reviewed and negative.  ? ?Physical Exam: ?VS:  There were no vitals taken for this visit. Marland Kitchen  BMI There is no height or weight on file to calculate BMI. ? ?Wt Readings from Last 3 Encounters:  ?11/04/21 209 lb 9.6 oz (95.1 kg)  ?10/20/21 210 lb 12.8 oz (95.6 kg)  ?10/01/20 211 lb (95.7 kg)  ? ?Affect appropriate ?Healthy:  appears stated age ?HEENT: normal ?Neck supple with no adenopathy ?JVP normal no bruits no thyromegaly ?Lungs clear with no wheezing and good diaphragmatic motion ?Heart:  S1/S2 no murmur, no rub, gallop or click ?PMI enlarged  ?Abdomen: benighn, BS positve, no tenderness, no AAA ?no bruit.  No HSM or HJR ?Distal pulses intact with no bruits ?No edema ?Neuro non-focal ?Skin warm and dry ?No muscular weakness ?Fasciotomy scars RLE with some foot drop  ? ? ? ? ?LABORATORY DATA: ? ?EKG:   .07/16/19  SR rate 69 old  anterior MI  ? ?Lab Results  ?Component Value Date  ? WBC 5.7 10/21/2021  ? HGB 17.4 (H) 10/21/2021  ? HCT 51.3 10/21/2021  ? PLT 205.0 10/21/2021  ? GLUCOSE 178 (H) 10/21/2021  ? CHOL 241 (H) 10/21/2021  ? TRIG 162.0 (H) 10/21/2021  ? HDL 43.80 10/21/2021  ? LDLCALC 165 (H) 10/21/2021  ? ALT 28 10/21/2021  ? AST 21 10/21/2021  ? NA 135 10/21/2021  ? K 3.9 10/21/2021  ? CL 102 10/21/2021  ? CREATININE 0.97 10/21/2021  ? BUN 17 10/21/2021  ? CO2 27 10/21/2021  ? TSH 3.200 03/03/2016  ? INR 2.4 09/16/2019  ? HGBA1C 8.4 (H) 10/21/2021  ? MICROALBUR 4.8 (H) 10/20/2021  ? ? ?BNP (last 3 results) ?No results for input(s): BNP in the last 8760 hours. ? ?ProBNP (last 3 results) ?No  results for input(s): PROBNP in the last 8760 hours. ? ? ?Other Studies Reviewed Today: ? ?Cardiac Cath Conclusion from 02/2016  ?  ?Prox RCA lesion, 10% stenosed. ?Dist LAD lesion, 60% stenosed. ?Dist RCA lesion, 90% stenosed. Post intervention, there is a 0% residual stenosis. ?Mid LAD lesion, 60% stenosed. Post intervention, there is a 0% residual stenosis. ?  ?1. Significant 2 vessel coronary artery disease involving the distal right coronary artery and mid LAD. The culprit for recent myocardial infarction seems to be the mid LAD stenosis which appears to be thrombotic and hazy in spite of being only 60% in severity. However, this was significant by FFR. ?  ?2. Moderately elevated left ventricular end-diastolic pressure. LV angiography was not performed due to LV thrombus. ?  ?3. Successful angioplasty and drug-eluting stent placement to the distal right coronary artery and mid left anterior descending artery. ?  ?  ?Recommendations: ?Recommend treatment with aspirin, Plavix and warfarin for one month. After one month, aspirin can be discontinued. Heparin can be resumed today 8 hours after sheath pull. Warfarin can be started. ?I switched from propranolol to carvedilol. Continue treatment for cardiomyopathy and add an ACE inhibitor or ARB  before hospital discharge. ?There was residual 60% distal LAD stenosis which was left to be treated medically.  ?  ?Echo Study Conclusions from 12/19/17  ? ?Study Conclusions ?  ?- Left ventricle: The cavity size wa

## 2021-11-15 ENCOUNTER — Ambulatory Visit: Payer: 59 | Admitting: Cardiovascular Disease

## 2021-11-22 LAB — HM DIABETES EYE EXAM

## 2021-12-29 NOTE — Progress Notes (Signed)
? ? ? ?CARDIOLOGY OFFICE NOTE ? ?Date:  12/30/2021  ? ? ?Troy Yu ?Date of Birth: 1960/11/30 ?Medical Record #[id number] ? ?PCP:  Shade Flood, MD  ?Cardiologist:  New  ? ? ? ? ?History of Present Illness: ?61 y.o. referred back to cardiology after 3 year absence by Dr Neva Seat seen initially in ER June 2017 with subacute anterior MI complicated by distal embolization to right leg. Had a complicated RLE embolectomy requiring fasciotomy by Dr Imogene Burn with long healing process including wound Vac.  Had DES to distal RCA and mid LAD with EF 30-35% large apical thrombus. Diagnosed with DM Has refused a lot of meds and care.  ? ?Still wants to stop meds Discussed importance of ACE  ?But he never filled script Seen by Dr Arbie Cookey June 2019 told to stay on life long coumadin but not taking LDL 165 10/21/21 and he has not taken statin  ? ?"I think I'm doing ok and don't want to take unnecessary medication" ? ?Doesn't take DM meds well A1c elevated 8.4 10/21/21  ? ?18yo daughter and 15 yo son visit him He has 4 children ?Ex wife went back to [place] ? ?He wants referral back to Dr Early  ? ?I discussed fact he should be on coreg/ARNI but he does not like taking too much medicine ?And just got started on statin and Ozempic  ? ? ?Past Medical History:  ?Diagnosis Date  ? Diabetes (HCC)   ? Hypercholesteremia   ? LV (left ventricular) mural thrombus   ? NSTEMI (non-ST elevated myocardial infarction) (HCC)   ? ? ?Past Surgical History:  ?Procedure Laterality Date  ? APPLICATION OF WOUND VAC Right 02/22/2016  ? Procedure: APPLICATION OF Negative Pressure Dressing Right Lower Leg;  Surgeon: Fransisco Hertz, MD;  Location: [facility] OR;  Service: Vascular;  Laterality: Right;  ? APPLICATION OF WOUND VAC Right 02/26/2016  ? Procedure: APPLICATION OF WOUND VAC, RIGHT MEDIAL LOWER LEG FASCIOTOMY SITE;  Surgeon: Larina Earthly, MD;  Location: [facility] OR;  Service: Vascular;  Laterality: Right;  ? CARDIAC CATHETERIZATION N/A 03/09/2016  ? Procedure:  Coronary Stent Intervention;  Surgeon: Iran Ouch, MD;  Location: [facility] INV[facility] LAB;  Service: Cardiovascular;  Laterality: N/A;  distal RCA ?mid LAD  ? CARDIAC CATHETERIZATION N/A 03/09/2016  ? Procedure: Intravascular Pressure Wire/FFR Study;  Surgeon: Iran Ouch, MD;  Location: [facility] LAB;  Service: Cardiovascular;  Laterality: N/A;  mid LAD  ? CARDIAC CATHETERIZATION N/A 03/09/2016  ? Procedure: Left Heart Cath and Coronary Angiography;  Surgeon: Iran Ouch, MD;  Location: [facility] LAB;  Service: Cardiovascular;  Laterality: N/A;  ? EMBOLECTOMY Right 02/21/2016  ? Procedure: EMBOLECTOMY RIGHT COMMON FEMORAL ARTERY; FOUR COMPARTMENT FASCIOTOMY;  Surgeon: Fransisco Hertz, MD;  Location: [facility] OR;  Service: Vascular;  Laterality: Right;  ? FASCIOTOMY CLOSURE Right 02/26/2016  ? Procedure: RIGHT LATERAL LOWER LEG FASCIOTOMY CLOSURE;  Surgeon: Larina Earthly, MD;  Location: [facility] OR;  Service: Vascular;  Laterality: Right;  ? HEMATOMA EVACUATION Right 02/22/2016  ? Procedure: EVACUATION HEMATOMA With Placement of Negative Pressure Dressing;  Surgeon: Fransisco Hertz, MD;  Location: [facility] OR;  Service: Vascular;  Laterality: Right;  ? ? ? ?Medications: ?Current Outpatient Medications  ?Medication Sig Dispense Refill  ? aspirin EC 81 MG tablet Take 81 mg by mouth daily. Swallow whole.    ? atorvastatin (LIPITOR) 20 MG tablet Take 1 tablet (20 mg total) by mouth daily.  90 tablet 1  ? diclofenac Sodium (VOLTAREN) 1 % GEL Apply 2 g topically 4 (four) times daily. 350 g 0  ? glucose blood (ONETOUCH VERIO) test strip 1 strip by In Vitro route 2 times daily. Dx: E11.9 100 each 0  ? Semaglutide, 1 MG/DOSE, 4 MG/3ML SOPN Inject 1 mg as directed once a week. 3 mL 3  ? ?No current facility-administered medications for this visit.  ? ? ?Allergies: ?Allergies  ?Allergen Reactions  ? Metformin   ?  Other reaction(s): Dizziness (intolerance)  ? ? ?Social History: ?The patient  reports that he has never smoked. He has  never used smokeless tobacco. He reports that he does not drink alcohol and does not use drugs. ?  ?Family History: ?The patient's family history includes Heart Problems in his father and mother.  ? ?Review of Systems: ?Please see the history of present illness.   Otherwise, the review of systems is positive for none.   All other systems are reviewed and negative.  ? ?Physical Exam: ?VS:  BP 118/64   Pulse 83   Ht 5\' 8"  (1.727 m)   Wt 211 lb 9.6 oz (96 kg)   SpO2 97%   BMI 32.17 kg/m?   BMI Body mass index is 32.17 kg/m?. ? ?Wt Readings from Last 3 Encounters:  ?12/30/21 211 lb 9.6 oz (96 kg)  ?11/04/21 209 lb 9.6 oz (95.1 kg)  ?10/20/21 210 lb 12.8 oz (95.6 kg)  ? ?Affect appropriate ?Healthy:  appears stated age ?HEENT: normal ?Neck supple with no adenopathy ?JVP normal no bruits no thyromegaly ?Lungs clear with no wheezing and good diaphragmatic motion ?Heart:  S1/S2 no murmur, no rub, gallop or click ?PMI enlarged  ?Abdomen: benighn, BS positve, no tenderness, no AAA ?no bruit.  No HSM or HJR ?Distal pulses intact with no bruits ?No edema ?Neuro non-focal ?Skin warm and dry ?No muscular weakness ?Fasciotomy scars RLE with some foot drop  ? ? ? ? ?LABORATORY DATA: ? ?EKG:   .07/16/19  SR rate 35 old anterior MI  ? ?Lab Results  ?Component Value Date  ? WBC 5.7 10/21/2021  ? HGB 17.4 (H) 10/21/2021  ? HCT 51.3 10/21/2021  ? PLT 205.0 10/21/2021  ? GLUCOSE 178 (H) 10/21/2021  ? CHOL 241 (H) 10/21/2021  ? TRIG 162.0 (H) 10/21/2021  ? HDL 43.80 10/21/2021  ? LDLCALC 165 (H) 10/21/2021  ? ALT 28 10/21/2021  ? AST 21 10/21/2021  ? NA 135 10/21/2021  ? K 3.9 10/21/2021  ? CL 102 10/21/2021  ? CREATININE 0.97 10/21/2021  ? BUN 17 10/21/2021  ? CO2 27 10/21/2021  ? TSH 3.200 03/03/2016  ? INR 2.4 09/16/2019  ? HGBA1C 8.4 (H) 10/21/2021  ? MICROALBUR 4.8 (H) 10/20/2021  ? ? ?BNP (last 3 results) ?No results for input(s): BNP in the last 8760 hours. ? ?ProBNP (last 3 results) ?No results for input(s): PROBNP in the  last 8760 hours. ? ? ?Other Studies Reviewed Today: ? ?Cardiac Cath Conclusion from 02/2016  ?  ?Prox RCA lesion, 10% stenosed. ?Dist LAD lesion, 60% stenosed. ?Dist RCA lesion, 90% stenosed. Post intervention, there is a 0% residual stenosis. ?Mid LAD lesion, 60% stenosed. Post intervention, there is a 0% residual stenosis. ?  ?1. Significant 2 vessel coronary artery disease involving the distal right coronary artery and mid LAD. The culprit for recent myocardial infarction seems to be the mid LAD stenosis which appears to be thrombotic and hazy in spite of being only  60% in severity. However, this was significant by FFR. ?  ?2. Moderately elevated left ventricular end-diastolic pressure. LV angiography was not performed due to LV thrombus. ?  ?3. Successful angioplasty and drug-eluting stent placement to the distal right coronary artery and mid left anterior descending artery. ?  ?  ?Recommendations: ?Recommend treatment with aspirin, Plavix and warfarin for one month. After one month, aspirin can be discontinued. Heparin can be resumed today 8 hours after sheath pull. Warfarin can be started. ?I switched from propranolol to carvedilol. Continue treatment for cardiomyopathy and add an ACE inhibitor or ARB before hospital discharge. ?There was residual 60% distal LAD stenosis which was left to be treated medically.  ?  ?Echo Study Conclusions from 12/19/17  ? ?Study Conclusions ?  ?- Left ventricle: The cavity size was normal. Systolic function was ?  moderately reduced. The estimated ejection fraction was in the ?  range of 35% to 40%. Doppler parameters are consistent with ?  abnormal left ventricular relaxation (grade 1 diastolic ?  dysfunction). Mild to moderate concentric and moderate focal ?  basal septal hypertrophy. ?- Regional wall motion abnormality: Akinesis of the mid ?  anteroseptal and apical myocardium; severe hypokinesis of the ?  apical anterior, apical inferior, and apical septal myocardium; ?   moderate hypokinesis of the apical lateral myocardium. ?- Aortic valve: There was mild regurgitation. ?- Aorta: Mild aortic root dilatation. ? . ?  ?Assessment/Plan: ?1. Anterior MI - July 2017  with associated LV dysfu

## 2021-12-30 ENCOUNTER — Ambulatory Visit: Payer: 59 | Admitting: Cardiovascular Disease

## 2021-12-30 ENCOUNTER — Encounter: Payer: Self-pay | Admitting: Cardiovascular Disease

## 2021-12-30 VITALS — BP 118/64 | HR 83 | Ht 68.0 in | Wt 211.6 lb

## 2021-12-30 DIAGNOSIS — I255 Ischemic cardiomyopathy: Secondary | ICD-10-CM | POA: Diagnosis not present

## 2021-12-30 DIAGNOSIS — E083513 Diabetes mellitus due to underlying condition with proliferative diabetic retinopathy with macular edema, bilateral: Secondary | ICD-10-CM

## 2021-12-30 DIAGNOSIS — I739 Peripheral vascular disease, unspecified: Secondary | ICD-10-CM | POA: Diagnosis not present

## 2021-12-30 DIAGNOSIS — E782 Mixed hyperlipidemia: Secondary | ICD-10-CM

## 2021-12-30 DIAGNOSIS — I513 Intracardiac thrombosis, not elsewhere classified: Secondary | ICD-10-CM

## 2021-12-30 DIAGNOSIS — I420 Dilated cardiomyopathy: Secondary | ICD-10-CM | POA: Insufficient documentation

## 2021-12-30 NOTE — Patient Instructions (Addendum)
Medication Instructions:  ?Your physician recommends that you continue on your current medications as directed. Please refer to the Current Medication list given to you today. ? ?*If you need a refill on your cardiac medications before your next appointment, please call your pharmacy* ? ?Lab Work: ?If you have labs (blood work) drawn today and your tests are completely normal, you will receive your results only by: ?MyChart Message (if you have MyChart) OR ?A paper copy in the mail ?If you have any lab test that is abnormal or we need to change your treatment, we will call you to review the results. ? ?Testing/Procedures: ?Your physician has requested that you have an echocardiogram. Echocardiography is a painless test that uses sound waves to create images of your heart. It provides your doctor with information about the size and shape of your heart and how well your heart?s chambers and valves are working. This procedure takes approximately one hour. There are no restrictions for this procedure. ? ?Follow-Up: ?At [facility], you and your health needs are our priority.  As part of our continuing mission to provide you with exceptional heart care, we have created designated Provider Care Teams.  These Care Teams include your primary Cardiologist (physician) and Advanced Practice Providers (APPs -  Physician Assistants and Nurse Practitioners) who all work together to provide you with the care you need, when you need it. ? ?We recommend signing up for the patient portal called "MyChart".  Sign up information is provided on this After Visit Summary.  MyChart is used to connect with patients for Virtual Visits (Telemedicine).  Patients are able to view lab/test results, encounter notes, upcoming appointments, etc.  Non-urgent messages can be sent to your provider as well.   ?To learn more about what you can do with MyChart, go to [email or web address].   ? ?Your next appointment:   ?1 year(s) ? ?The format for  your next appointment:   ?In Person ? ?Provider:   ?Charlton Haws, MD { ? ?You have been referred to [facility] Dr. Arbie Cookey. ? ? ?Important Information About Sugar ? ? ? ? ?  ?

## 2022-01-06 ENCOUNTER — Other Ambulatory Visit (HOSPITAL_COMMUNITY): Payer: Self-pay

## 2022-01-06 ENCOUNTER — Ambulatory Visit (INDEPENDENT_AMBULATORY_CARE_PROVIDER_SITE_OTHER): Payer: 59 | Admitting: Family Medicine

## 2022-01-06 VITALS — BP 120/74 | HR 78 | Temp 98.2°F | Resp 16 | Ht 68.0 in | Wt 207.4 lb

## 2022-01-06 DIAGNOSIS — E785 Hyperlipidemia, unspecified: Secondary | ICD-10-CM

## 2022-01-06 DIAGNOSIS — E1165 Type 2 diabetes mellitus with hyperglycemia: Secondary | ICD-10-CM

## 2022-01-06 LAB — LIPID PANEL
Cholesterol: 146 mg/dL (ref 0–200)
HDL: 47.3 mg/dL (ref >39.00)
LDL Cholesterol: 87 mg/dL (ref 0–99)
NonHDL: 98.32
Total CHOL/HDL Ratio: 3
Triglycerides: 59 mg/dL (ref 0.0–149.0)
VLDL: 11.8 mg/dL (ref 0.0–40.0)

## 2022-01-06 LAB — COMPREHENSIVE METABOLIC PANEL
ALT: 46 U/L (ref 0–53)
AST: 26 U/L (ref 0–37)
Albumin: 4.1 g/dL (ref 3.5–5.2)
Alkaline Phosphatase: 62 U/L (ref 39–117)
BUN: 13 mg/dL (ref 6–23)
CO2: 26 mEq/L (ref 19–32)
Calcium: 9 mg/dL (ref 8.4–10.5)
Chloride: 103 mEq/L (ref 96–112)
Creatinine, Ser: 0.95 mg/dL (ref 0.40–1.50)
GFR: 85.78 mL/min (ref >60.00)
Glucose, Bld: 141 mg/dL — ABNORMAL HIGH (ref 70–99)
Potassium: 4 mEq/L (ref 3.5–5.1)
Sodium: 136 mEq/L (ref 135–145)
Total Bilirubin: 0.7 mg/dL (ref 0.2–1.2)
Total Protein: 7.2 g/dL (ref 6.0–8.3)

## 2022-01-06 LAB — HEMOGLOBIN A1C: Hgb A1c MFr Bld: 7.4 % — ABNORMAL HIGH (ref 4.6–6.5)

## 2022-01-06 MED ORDER — SEMAGLUTIDE (1 MG/DOSE) 4 MG/3ML ~~LOC~~ SOPN
1.0000 mg | PEN_INJECTOR | SUBCUTANEOUS | 1 refills | Status: DC
  Filled 2022-01-06 – 2022-01-11 (×2): qty 9, 84d supply, fill #0
  Filled 2022-03-25: qty 9, 84d supply, fill #1

## 2022-01-06 MED ORDER — ONETOUCH VERIO VI STRP
ORAL_STRIP | 3 refills | Status: DC
  Filled 2022-01-06 – 2022-01-11 (×2): qty 100, 50d supply, fill #0

## 2022-01-06 NOTE — Progress Notes (Signed)
? ?Subjective:  ?Patient ID: Troy Yu, male    DOB: 1961/04/21  Age: 61 y.o. MRN: [id number] ? ?CC:  ?Chief Complaint  ?Patient presents with  ? Diabetes  ?  Due for labs   ? Hyperlipidemia  ?  Due for labs, is fasting today   ? ? ?HPI ?Can Troy Yu presents for  ? ?Diabetes: ?Complicated by hyperglycemia,microalbuminuria, history of ischemic cardiomyopathy followed by cardiology, last visit May 4 noted.  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  Updated echo ordered.  He declined taking ARNI, Coreg.  Not taking ARB/ACE inhibitor, with meds. ?History of left ventricular mild thrombus, resolved on TTE in 2019 but maintained on Coumadin but not compliant with anticoagulation. ?Not taking any diabetes meds per cardiology note May 4.   ?Last visit with me in march  - Ozempic 0.5mg  weekly. Dose increased to 1mg . Took 1 dose of 1mg  for 4 weeks, then when filled med had 0.5mg  filled - on 0.5mg  past few months.  ?Lipitor 20mg  qd  - taking daily, aspirin.  ?Home readings:  ?Fasting: 150 today. ?No postrandials.  ?Sx lows. None.  ?Microalbumin: 4.8 on 10/20/21 ?Optho, foot exam, pneumovax:  ?Up to date.  ?Fasting today.  ? ?Lab Results  ?Component Value Date  ? HGBA1C 8.4 (H) 10/21/2021  ? HGBA1C 7.9 (H) 10/01/2020  ? HGBA1C 8.1 (H) 07/07/2020  ? ?Lab Results  ?Component Value Date  ? MICROALBUR 4.8 (H) 10/20/2021  ? LDLCALC 165 (H) 10/21/2021  ? CREATININE 0.97 10/21/2021  ? ? ?History ?Patient Active Problem List  ? Diagnosis Date Noted  ? DCM (dilated cardiomyopathy) (HCC) 12/30/2021  ? History of fasciotomy 06/07/2016  ? Status post coronary artery stent placement   ? Acute combined systolic and diastolic heart failure (HCC)   ? Abnormal CXR   ? Abdominal pain   ? Chest pain   ? NSTEMI (non-ST elevated myocardial infarction) (HCC)   ? Fever   ? Thromboembolism (HCC)   ? Vascular occlusion   ? Pain   ? Anterior subendocardial MI (HCC) 02/21/2016  ? Right leg pain  02/21/2016  ? Non-STEMI (non-ST elevated myocardial infarction) (HCC) 02/21/2016  ? LV (left ventricular) mural thrombus 02/21/2016  ? Elevated troponin 02/21/2016  ? Abnormal EKG 02/21/2016  ? Acute MI (HCC) 02/21/2016  ? History of embolectomy 02/21/2016  ? Diabetes (HCC)   ? ?Past Medical History:  ?Diagnosis Date  ? Diabetes (HCC)   ? Hypercholesteremia   ? LV (left ventricular) mural thrombus   ? NSTEMI (non-ST elevated myocardial infarction) (HCC)   ? ?Past Surgical History:  ?Procedure Laterality Date  ? APPLICATION OF WOUND VAC Right 02/22/2016  ? Procedure: APPLICATION OF Negative Pressure Dressing Right Lower Leg;  Surgeon: Fransisco Hertz, MD;  Location: [facility] OR;  Service: Vascular;  Laterality: Right;  ? APPLICATION OF WOUND VAC Right 02/26/2016  ? Procedure: APPLICATION OF WOUND VAC, RIGHT MEDIAL LOWER LEG FASCIOTOMY SITE;  Surgeon: Larina Earthly, MD;  Location: [facility] OR;  Service: Vascular;  Laterality: Right;  ? CARDIAC CATHETERIZATION N/A 03/09/2016  ? Procedure: Coronary Stent Intervention;  Surgeon: Iran Ouch, MD;  Location: [facility] INV[facility] LAB;  Service: Cardiovascular;  Laterality: N/A;  distal RCA ?mid LAD  ? CARDIAC CATHETERIZATION N/A 03/09/2016  ? Procedure: Intravascular Pressure Wire/FFR Study;  Surgeon: Iran Ouch, MD;  Location: [facility];  Service: Cardiovascular;  Laterality: N/A;  mid  LAD  ? CARDIAC CATHETERIZATION N/A 03/09/2016  ? Procedure: Left Heart Cath and Coronary Angiography;  Surgeon: Iran Ouch, MD;  Location: [facility];  Service: Cardiovascular;  Laterality: N/A;  ? EMBOLECTOMY Right 02/21/2016  ? Procedure: EMBOLECTOMY RIGHT COMMON FEMORAL ARTERY; FOUR COMPARTMENT FASCIOTOMY;  Surgeon: Fransisco Hertz, MD;  Location: [facility] OR;  Service: Vascular;  Laterality: Right;  ? FASCIOTOMY CLOSURE Right 02/26/2016  ? Procedure: RIGHT LATERAL LOWER LEG FASCIOTOMY CLOSURE;  Surgeon: Larina Earthly, MD;  Location: [facility] OR;  Service: Vascular;  Laterality: Right;  ? HEMATOMA  EVACUATION Right 02/22/2016  ? Procedure: EVACUATION HEMATOMA With Placement of Negative Pressure Dressing;  Surgeon: Fransisco Hertz, MD;  Location: [facility] OR;  Service: Vascular;  Laterality: Right;  ? ?Allergies  ?Allergen Reactions  ? Metformin   ?  Other reaction(s): Dizziness (intolerance)  ? ?Prior to Admission medications   ?Medication Sig Start Date End Date Taking? Authorizing Provider  ?aspirin EC 81 MG tablet Take 81 mg by mouth daily. Swallow whole.   Yes [provider]  ?atorvastatin (LIPITOR) 20 MG tablet Take 1 tablet (20 mg total) by mouth daily. 11/08/21  Yes Shade Flood, MD  ?diclofenac Sodium (VOLTAREN) 1 % GEL Apply 2 g topically 4 (four) times daily. 04/24/20  Yes Burky, Dorene Grebe B, NP  ?glucose blood (ONETOUCH VERIO) test strip 1 strip by In Vitro route 2 times daily. Dx: E11.9 06/16/20  Yes Mardella Layman, MD  ?Semaglutide, 1 MG/DOSE, 4 MG/3ML SOPN Inject 1 mg as directed once a week. 11/04/21  Yes Shade Flood, MD  ? ?Social History  ? ?Socioeconomic History  ? Marital status: Married  ?  Spouse name: [name]  ? Number of children: 5  ? Years of education: COLLEGE  ? Highest education level: Not on file  ?Occupational History  ? Occupation: UNEMPLOYED  ?Tobacco Use  ? Smoking status: Never  ? Smokeless tobacco: Never  ?Vaping Use  ? Vaping Use: Never used  ?Substance and Sexual Activity  ? Alcohol use: No  ? Drug use: No  ? Sexual activity: Not on file  ?Other Topics Concern  ? Not on file  ?Social History Narrative  ? Not on file  ? ?Social Determinants of Health  ? ?Financial Resource Strain: Not on file  ?Food Insecurity: Not on file  ?Transportation Needs: Not on file  ?Physical Activity: Not on file  ?Stress: Not on file  ?Social Connections: Not on file  ?Intimate Partner Violence: Not on file  ? ? ?Review of Systems  ?Constitutional:  Negative for fatigue and unexpected weight change.  ?Eyes:  Negative for visual disturbance.  ?Respiratory:  Negative for cough, chest tightness and  shortness of breath.   ?Cardiovascular:  Negative for chest pain, palpitations and leg swelling.  ?Gastrointestinal:  Negative for abdominal pain and blood in stool.  ?Neurological:  Negative for dizziness, light-headedness and headaches.  ? ? ?Objective:  ? ?Vitals:  ? 01/06/22 1153  ?BP: 120/74  ?Pulse: 78  ?Resp: 16  ?Temp: 98.2 ?F (36.8 ?C)  ?TempSrc: Temporal  ?SpO2: 97%  ?Weight: 207 lb 6.4 oz (94.1 kg)  ?Height: 5\' 8"  (1.727 m)  ? ? ? ?Physical Exam ?Vitals reviewed.  ?Constitutional:   ?   Appearance: He is well-developed.  ?HENT:  ?   Head: Normocephalic and atraumatic.  ?Neck:  ?   Vascular: No carotid bruit or JVD.  ?Cardiovascular:  ?   Rate and Rhythm: Normal rate and  regular rhythm.  ?   Heart sounds: Normal heart sounds. No murmur heard. ?Pulmonary:  ?   Effort: Pulmonary effort is normal.  ?   Breath sounds: Normal breath sounds. No rales.  ?Musculoskeletal:  ?   Right lower leg: No edema.  ?   Left lower leg: No edema.  ?Skin: ?   General: Skin is warm and dry.  ?Neurological:  ?   Mental Status: He is alert and oriented to person, place, and time.  ?Psychiatric:     ?   Mood and Affect: Mood normal.  ? ? ?Assessment & Plan:  ?Kamsiyochukwu Troy Micki Riley Chinita Greenland is a 61 y.o. male . ?Hyperlipidemia, unspecified hyperlipidemia type - Plan: Comprehensive metabolic panel, Lipid panel ? -With underlying cardiac disease as above.  Denies cardiac symptoms at this time.  Tolerating Lipitor 20 mg daily, continue same, updated labs ordered. ? ?Type 2 diabetes mellitus with hyperglycemia, without long-term current use of insulin (HCC) - Plan: Semaglutide, 1 MG/DOSE, 4 MG/3ML SOPN, glucose blood (ONETOUCH VERIO) test strip, Comprehensive metabolic panel, Hemoglobin A1c ? -Unfortunately some confusion over Ozempic doses.  He should be on 1 mg daily, and was tolerating that dose for initial month.  Check A1c, but if uncontrolled may continue to monitor with 1 mg dose ordered again today.  Stressed importance on remaining on this  dose.  Recheck in 3 months with RTC precautions given. ? ?Meds ordered this encounter  ?Medications  ? Semaglutide, 1 MG/DOSE, 4 MG/3ML SOPN  ?  Sig: Inject 1 mg into the skin once a week.  ?  Dispense:  3 m

## 2022-01-06 NOTE — Patient Instructions (Addendum)
You should be on ozempic 1mg  per week. That should do better for blood sugar control.  ?No other med changes today.  ? ? ? ?

## 2022-01-11 ENCOUNTER — Other Ambulatory Visit (HOSPITAL_COMMUNITY): Payer: Self-pay

## 2022-01-11 ENCOUNTER — Other Ambulatory Visit: Payer: Self-pay | Admitting: Family Medicine

## 2022-01-11 DIAGNOSIS — E1165 Type 2 diabetes mellitus with hyperglycemia: Secondary | ICD-10-CM

## 2022-01-11 MED ORDER — TRUE METRIX BLOOD GLUCOSE TEST VI STRP
1.0000 | ORAL_STRIP | Freq: Two times a day (BID) | 3 refills | Status: AC
  Filled 2022-01-11: qty 100, 50d supply, fill #0
  Filled 2022-11-09: qty 100, 50d supply, fill #1

## 2022-01-11 NOTE — Progress Notes (Signed)
Received message from pharmacist, Ozempic 39-month supply covered similar to 1 month.  Authorization for 30-month supply given. ?

## 2022-01-12 ENCOUNTER — Ambulatory Visit: Payer: 59 | Admitting: Cardiovascular Disease

## 2022-01-19 ENCOUNTER — Ambulatory Visit (INDEPENDENT_AMBULATORY_CARE_PROVIDER_SITE_OTHER): Payer: 59 | Admitting: Family Medicine

## 2022-01-19 VITALS — BP 130/72 | HR 82 | Temp 98.2°F | Resp 16 | Ht 68.0 in | Wt 207.6 lb

## 2022-01-19 DIAGNOSIS — E1165 Type 2 diabetes mellitus with hyperglycemia: Secondary | ICD-10-CM

## 2022-01-19 DIAGNOSIS — K8020 Calculus of gallbladder without cholecystitis without obstruction: Secondary | ICD-10-CM | POA: Diagnosis not present

## 2022-01-19 DIAGNOSIS — Z0000 Encounter for general adult medical examination without abnormal findings: Secondary | ICD-10-CM

## 2022-01-19 DIAGNOSIS — Z23 Encounter for immunization: Secondary | ICD-10-CM

## 2022-01-19 DIAGNOSIS — Z1211 Encounter for screening for malignant neoplasm of colon: Secondary | ICD-10-CM

## 2022-01-19 NOTE — Patient Instructions (Addendum)
Tetanus vaccine given today. Good for 10 years.  Cologuard colon cancer test will be sent to your house.  Walking is a great form of exercise. Goal of 150 minutes per week spread out over most days.  I will order ultrasound to recheck gallstones and gallbladder. I will let you know if any concerns. Thanks for coming in today.  Return to the clinic or go to the nearest emergency room if any of your symptoms worsen or new symptoms occur.  Preventive Care 33-45 Years Old, Male Preventive care refers to lifestyle choices and visits with your health care provider that can promote health and wellness. Preventive care visits are also called wellness exams. What can I expect for my preventive care visit? Counseling During your preventive care visit, your health care provider may ask about your: Medical history, including: Past medical problems. Family medical history. Current health, including: Emotional well-being. Home life and relationship well-being. Sexual activity. Lifestyle, including: Alcohol, nicotine or tobacco, and drug use. Access to firearms. Diet, exercise, and sleep habits. Safety issues such as seatbelt and bike helmet use. Sunscreen use. Work and work Astronomer. Physical exam Your health care provider will check your: Height and weight. These may be used to calculate your BMI (body mass index). BMI is a measurement that tells if you are at a healthy weight. Waist circumference. This measures the distance around your waistline. This measurement also tells if you are at a healthy weight and may help predict your risk of certain diseases, such as type 2 diabetes and high blood pressure. Heart rate and blood pressure. Body temperature. Skin for abnormal spots. What immunizations do I need?  Vaccines are usually given at various ages, according to a schedule. Your health care provider will recommend vaccines for you based on your age, medical history, and lifestyle or other  factors, such as travel or where you work. What tests do I need? Screening Your health care provider may recommend screening tests for certain conditions. This may include: Lipid and cholesterol levels. Diabetes screening. This is done by checking your blood sugar (glucose) after you have not eaten for a while (fasting). Hepatitis B test. Hepatitis C test. HIV (human immunodeficiency virus) test. STI (sexually transmitted infection) testing, if you are at risk. Lung cancer screening. Prostate cancer screening. Colorectal cancer screening. Talk with your health care provider about your test results, treatment options, and if necessary, the need for more tests. Follow these instructions at home: Eating and drinking  Eat a diet that includes fresh fruits and vegetables, whole grains, lean protein, and low-fat dairy products. Take vitamin and mineral supplements as recommended by your health care provider. Do not drink alcohol if your health care provider tells you not to drink. If you drink alcohol: Limit how much you have to 0-2 drinks a day. Know how much alcohol is in your drink. In the [place] one drink equals one 12 oz bottle of beer (355 mL), one 5 oz glass of wine (148 mL), or one 1 oz glass of hard liquor (44 mL). Lifestyle Brush your teeth every morning and night with fluoride toothpaste. Floss one time each day. Exercise for at least 30 minutes 5 or more days each week. Do not use any products that contain nicotine or tobacco. These products include cigarettes, chewing tobacco, and vaping devices, such as e-cigarettes. If you need help quitting, ask your health care provider. Do not use drugs. If you are sexually active, practice safe sex. Use a condom or other  form of protection to prevent STIs. Take aspirin only as told by your health care provider. Make sure that you understand how much to take and what form to take. Work with your health care provider to find out whether it is  safe and beneficial for you to take aspirin daily. Find healthy ways to manage stress, such as: Meditation, yoga, or listening to music. Journaling. Talking to a trusted person. Spending time with friends and family. Minimize exposure to UV radiation to reduce your risk of skin cancer. Safety Always wear your seat belt while driving or riding in a vehicle. Do not drive: If you have been drinking alcohol. Do not ride with someone who has been drinking. When you are tired or distracted. While texting. If you have been using any mind-altering substances or drugs. Wear a helmet and other protective equipment during sports activities. If you have firearms in your house, make sure you follow all gun safety procedures. What's next? Go to your health care provider once a year for an annual wellness visit. Ask your health care provider how often you should have your eyes and teeth checked. Stay up to date on all vaccines. This information is not intended to replace advice given to you by your health care provider. Make sure you discuss any questions you have with your health care provider. Document Revised: 02/10/2021 Document Reviewed: 02/10/2021 Elsevier Patient Education  2023 [employer].

## 2022-01-19 NOTE — Progress Notes (Unsigned)
Subjective:  Patient ID: Troy Yu, male    DOB: [birth year]  Age: 61 y.o. MRN: [id number]  CC:  Chief Complaint  Patient presents with   Annual Exam    Pt here for exam, no concerns, discuss if any further changes from last labs     HPI Troy Yu presents for Annual Exam  Recent visit May 11 for chronic medical problems. We have clarified his Ozempic doses previously. Plan for 1 mg dose at May 11 visit.  Plan for recheck in 3 months with A1c at that time. Tolerating 1mg  dose. Improved A1c.  Fasting 130 today. No symptomatic lows.   Lab Results  Component Value Date   HGBA1C 7.4 (H) 01/06/2022        01/06/2022   11:58 AM 10/20/2021    2:29 PM 10/01/2020    3:11 PM 07/31/2020    3:29 PM 07/06/2020    3:43 PM  Depression screen PHQ 2/9  Decreased Interest 0 0 0 0 0  Down, Depressed, Hopeless 0 0 0 0 0  PHQ - 2 Score 0 0 0 0 0    Health Maintenance  Topic Date Due   COVID-19 Vaccine (1) 02/04/2022 (Originally 01/01/1962)   Zoster Vaccines- Shingrix (1 of 2) 04/21/2022 (Originally 07/03/2009)   COLONOSCOPY (Pts 45-47yrs Insurance coverage will need to be confirmed)  11/05/2022 (Originally 07/03/2004)   Hepatitis C Screening  11/05/2022 (Originally 07/03/1977)   HIV Screening  11/05/2022 (Originally 07/03/1974)   INFLUENZA VACCINE  03/29/2022   HEMOGLOBIN A1C  07/09/2022   URINE MICROALBUMIN  10/20/2022   FOOT EXAM  11/05/2022   OPHTHALMOLOGY EXAM  11/23/2022   TETANUS/TDAP  04/29/2024   HPV VACCINES  Aged Out  Colonoscopy: no FH of colon CA, no personal hx of polyps. ? Blood on test years ago. No recent bleeding. Screening options with colonoscopy versus Cologuard discussed. Discussed timing of repeat testing intervals if normal, as well as potential need for diagnostic Colonoscopy if positive Cologuard. Understanding expressed, and chose Cologuard.   Prostate: does not have family history of prostate cancer The natural history of prostate cancer and  ongoing controversy regarding screening and potential treatment outcomes of prostate cancer has been discussed with the patient. The meaning of a false positive PSA and a false negative PSA has been discussed. He indicates understanding of the limitations of this screening test and wishes  to proceed with screening PSA testing.  DRE deferred.  No results found for: PSA1, PSA   There is no immunization history on file for this patient. Covid vaccine: declines Tdap: in past 10 years. Unknown exact time. Maybe more than 10 years - requests today - on order - will return for nurse visit.  Shingles vaccine: declines Pneumonia vaccine - declines.   No results found.  Optho - visit in March.   Dental:Yes and Within Last 6 months  Alcohol: none.   Tobacco: none.  Exercise/obesity: No regular exercise. Active at work.   Gallstones Small calcified stones on CT in July 2017: No cystic or solid hepatic lesions. No intra or extrahepatic biliary ductal dilatation. Small calcified gallstones are noted dependently in the gallbladder, measuring up to 7 mm in diameter. Gallbladder wall appears mildly thickened, and there are subtle pericholecystic inflammatory changes. Gallbladder is moderately distended. August 2017 abdomen 03/08/16: IMPRESSION: 1. Mildly distended gallbladder with a 9 mm stone impacted in the gallbladder neck. No significant gallbladder wall thickening and no pericholecystic fluid is observed. There is no  positive sonographic Murphy's sign. 2. Fatty infiltrative change of the liver. 3. No acute abnormality observed elsewhere within the abdomen. 4. Left pleural effusion. Denies abd pain, or gallbladder procedure prior. No nausea or abd pain with meals.    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/21/2016   Right leg pain 02/21/2016   Non-STEMI (non-ST elevated myocardial infarction) (HCC) 02/21/2016   LV (left ventricular) mural thrombus 02/21/2016   Elevated troponin 02/21/2016   Abnormal EKG 02/21/2016   Acute MI (HCC) 02/21/2016   History of embolectomy 02/21/2016   Diabetes (HCC)    Past Medical History:  Diagnosis Date   Diabetes (HCC)    Hypercholesteremia    LV (left ventricular) mural thrombus    NSTEMI (non-ST elevated myocardial infarction) [facility])    Past Surgical History:  Procedure Laterality Date   APPLICATION OF WOUND VAC Right 02/22/2016   Procedure: APPLICATION OF Negative Pressure Dressing Right Lower Leg;  Surgeon: Fransisco Hertz, MD;  Location: [facility] OR;  Service: Vascular;  Laterality: Right;   APPLICATION OF WOUND VAC Right 02/26/2016   Procedure: APPLICATION OF WOUND VAC, RIGHT MEDIAL LOWER LEG FASCIOTOMY SITE;  Surgeon: Larina Earthly, MD;  Location: [facility] OR;  Service: Vascular;  Laterality: Right;   CARDIAC CATHETERIZATION N/A 03/09/2016   Procedure: Coronary Stent Intervention;  Surgeon: Iran Ouch, MD;  Location: [facility] INVASIVE CV LAB;  Service: Cardiovascular;  Laterality: N/A;  distal RCA mid LAD   CARDIAC CATHETERIZATION N/A 03/09/2016   Procedure: Intravascular Pressure Wire/FFR Study;  Surgeon: Iran Ouch, MD;  Location: [facility] INVASIVE CV LAB;  Service: Cardiovascular;  Laterality: N/A;  mid LAD   CARDIAC CATHETERIZATION N/A 03/09/2016   Procedure: Left Heart Cath and Coronary Angiography;  Surgeon: Iran Ouch, MD;  Location: [facility] INVASIVE CV LAB;  Service: Cardiovascular;  Laterality: N/A;   EMBOLECTOMY Right 02/21/2016   Procedure: EMBOLECTOMY RIGHT COMMON FEMORAL ARTERY; FOUR COMPARTMENT FASCIOTOMY;  Surgeon: Fransisco Hertz, MD;  Location: [facility] OR;  Service: Vascular;  Laterality: Right;   FASCIOTOMY CLOSURE Right 02/26/2016   Procedure: RIGHT LATERAL LOWER LEG FASCIOTOMY CLOSURE;  Surgeon: Larina Earthly, MD;  Location: [facility] OR;  Service: Vascular;  Laterality: Right;   HEMATOMA EVACUATION Right 02/22/2016   Procedure: EVACUATION HEMATOMA With Placement of Negative Pressure Dressing;  Surgeon: Fransisco Hertz, MD;  Location: [facility] OR;  Service: Vascular;  Laterality: Right;   Allergies  Allergen Reactions   Metformin     Other reaction(s): Dizziness (intolerance)   Prior to Admission medications   Medication Sig Start Date End Date Taking? Authorizing Provider  aspirin EC 81 MG tablet Take 81 mg by mouth daily. Swallow whole.   Yes [provider]  atorvastatin (LIPITOR) 20 MG tablet Take 1 tablet (20 mg total) by mouth daily. 11/08/21  Yes Shade Flood, MD  diclofenac Sodium (VOLTAREN) 1 % GEL Apply 2 g topically 4 (four) times daily. 04/24/20  Yes Linus Mako B, NP  glucose blood (TRUE  METRIX BLOOD GLUCOSE TEST) test strip Use 1 strip to check blood sugar 2 times daily 01/11/22  Yes Shade Flood, MD  Semaglutide, 1 MG/DOSE, 4 MG/3ML SOPN Inject 1 mg into the skin once a week. 01/06/22  Yes Shade Flood, MD   Social History   Socioeconomic History   Marital status: Married    Spouse name: [name]   Number of children: 5   Years of education: COLLEGE   Highest education level: Not on file  Occupational History   Occupation: UNEMPLOYED  Tobacco Use   Smoking status: Never   Smokeless tobacco: Never  Vaping Use   Vaping Use: Never used  Substance and Sexual Activity   Alcohol use: No   Drug use: No   Sexual activity: Not on file  Other Topics Concern   Not on file  Social History Narrative   Not on file   Social Determinants of Health   Financial Resource Strain: Not on file  Food Insecurity: Not on file  Transportation Needs: Not on file  Physical Activity: Not on file   Stress: Not on file  Social Connections: Not on file  Intimate Partner Violence: Not on file    Review of Systems 13 point review of systems per patient health survey noted.  Negative other than as indicated above or in HPI.   Objective:   Vitals:   01/19/22 1514  BP: 130/72  Pulse: 82  Resp: 16  Temp: 98.2 F (36.8 C)  TempSrc: Temporal  SpO2: 97%  Weight: 207 lb 9.6 oz (94.2 kg)  Height: 5\' 8"  (1.727 m)   {Vitals History (Optional):23777}  Physical Exam Vitals reviewed.  Constitutional:      Appearance: He is well-developed.  HENT:     Head: Normocephalic and atraumatic.     Right Ear: External ear normal.     Left Ear: External ear normal.  Eyes:     Conjunctiva/sclera: Conjunctivae normal.     Pupils: Pupils are equal, round, and reactive to light.  Neck:     Thyroid: No thyromegaly.  Cardiovascular:     Rate and Rhythm: Normal rate and regular rhythm.     Heart sounds: Normal heart sounds.  Pulmonary:     Effort: Pulmonary effort is normal. No respiratory distress.     Breath sounds: Normal breath sounds. No wheezing.  Abdominal:     General: There is no distension.     Palpations: Abdomen is soft. There is no mass.     Tenderness: There is no abdominal tenderness. There is no guarding or rebound.     Comments: Nontender, negative Murphy's  Musculoskeletal:        General: No tenderness. Normal range of motion.     Cervical back: Normal range of motion and neck supple.  Lymphadenopathy:     Cervical: No cervical adenopathy.  Skin:    General: Skin is warm and dry.  Neurological:     Mental Status: He is alert and oriented to person, place, and time.     Deep Tendon Reflexes: Reflexes are normal and symmetric.  Psychiatric:        Behavior: Behavior normal.       Assessment & Plan:  Ranjit AL Micki Riley is a 61 y.o. male . No diagnosis found.   No orders of the defined types were placed in this encounter.  There are no Patient Instructions  on file for this visit.    Signed,   66, MD  [facility], [facility] 01/19/22 3:34 PM

## 2022-01-20 ENCOUNTER — Ambulatory Visit (HOSPITAL_COMMUNITY): Payer: 59 | Attending: Cardiovascular Disease

## 2022-01-20 ENCOUNTER — Encounter: Payer: Self-pay | Admitting: Family Medicine

## 2022-01-20 DIAGNOSIS — I255 Ischemic cardiomyopathy: Secondary | ICD-10-CM | POA: Diagnosis present

## 2022-01-20 LAB — ECHOCARDIOGRAM COMPLETE
Area-P 1/2: 3.65 cm2
S' Lateral: 3.1 cm

## 2022-01-20 MED ORDER — PERFLUTREN LIPID MICROSPHERE
1.0000 mL | INTRAVENOUS | Status: AC | PRN
  Administered 2022-01-20: 1 mL via INTRAVENOUS

## 2022-02-04 ENCOUNTER — Ambulatory Visit: Payer: 59 | Admitting: Cardiovascular Disease

## 2022-02-11 ENCOUNTER — Other Ambulatory Visit (HOSPITAL_COMMUNITY): Payer: Self-pay

## 2022-02-16 ENCOUNTER — Encounter: Payer: Self-pay | Admitting: Vascular Surgery

## 2022-02-16 ENCOUNTER — Ambulatory Visit: Payer: 59 | Admitting: Vascular Surgery

## 2022-02-16 VITALS — BP 124/79 | HR 93 | Temp 97.3°F | Resp 16 | Ht 68.0 in | Wt 208.8 lb

## 2022-02-16 DIAGNOSIS — Z9889 Other specified postprocedural states: Secondary | ICD-10-CM | POA: Diagnosis not present

## 2022-02-16 NOTE — Progress Notes (Signed)
Vascular and Vein Specialist of Troy Yu  Patient name: Troy Yu Alvia Grove MRN: [id number] DOB: 31-Dec-1960 Sex: male  REASON FOR CONSULT: Follow-up remote history of right lower extremity embolectomy and fasciotomy  HPI: Troy Yu is a 61 y.o. male, who is here for evaluation and follow-up.  He had presented with critical ischemia of his right leg and Troy Yu 2017.  He had a large myocardial infarction with cardiac source for embolus to his right leg.  He underwent emergent embolectomy and 4 compartment extensive fasciotomy with Dr.Chen.  He had a prolonged hospitalization and treatment of his fasciotomy wounds.  He eventually had complete healing.  In my last visit with him in 2019, he had palpable dorsalis pedis pulse.  He had completely healed all of his wounds.  He is seen today for follow-up.  He does report some restriction in movement of his right foot but does not have any true foot drop and is not dragging his toe when he walks.  He reports minimal swelling in his right leg.  Past Medical History:  Diagnosis Date   Diabetes (HCC)    Hypercholesteremia    LV (left ventricular) mural thrombus    NSTEMI (non-ST elevated myocardial infarction) (HCC)     Family History  Problem Relation Age of Onset   Heart Problems Father    Heart Problems Mother     SOCIAL HISTORY: Social History   Socioeconomic History   Marital status: Married    Spouse name: [name]   Number of children: 5   Years of education: COLLEGE   Highest education level: Not on file  Occupational History   Occupation: UNEMPLOYED  Tobacco Use   Smoking status: Never   Smokeless tobacco: Never  Vaping Use   Vaping Use: Never used  Substance and Sexual Activity   Alcohol use: No   Drug use: No   Sexual activity: Not on file  Other Topics Concern   Not on file  Social History Narrative   Not on file   Social Determinants of Health   Financial Resource Strain:  Not on file  Food Insecurity: Not on file  Transportation Needs: Not on file  Physical Activity: Not on file  Stress: Not on file  Social Connections: Not on file  Intimate Partner Violence: Not on file    Allergies  Allergen Reactions   Metformin     Other reaction(s): Dizziness (intolerance)    Current Outpatient Medications  Medication Sig Dispense Refill   aspirin EC 81 MG tablet Take 81 mg by mouth daily. Swallow whole.     atorvastatin (LIPITOR) 20 MG tablet Take 1 tablet (20 mg total) by mouth daily. 90 tablet 1   glucose blood (TRUE METRIX BLOOD GLUCOSE TEST) test strip Use 1 strip to check blood sugar 2 times daily 100 each 3   Semaglutide, 1 MG/DOSE, 4 MG/3ML SOPN Inject 1 mg into the skin once a week. 9 mL 1   diclofenac Sodium (VOLTAREN) 1 % GEL Apply 2 g topically 4 (four) times daily. (Patient not taking: Reported on 02/16/2022) 350 g 0   No current facility-administered medications for this visit.    REVIEW OF SYSTEMS:  [X]  denotes positive finding, [ ]  denotes negative finding Cardiac  Comments:  Chest pain or chest pressure:    Shortness of breath upon exertion:    Short of breath when lying flat:    Irregular heart rhythm:        Vascular  Pain in calf, thigh, or hip brought on by ambulation:    Pain in feet at night that wakes you up from your sleep:     Blood clot in your veins:    Leg swelling:         Pulmonary    Oxygen at home:    Productive cough:     Wheezing:         Neurologic    Sudden weakness in arms or legs:     Sudden numbness in arms or legs:     Sudden onset of difficulty speaking or slurred speech:    Temporary loss of vision in one eye:     Problems with dizziness:         Gastrointestinal    Blood in stool:     Vomited blood:         Genitourinary    Burning when urinating:     Blood in urine:        Psychiatric    Major depression:         Hematologic    Bleeding problems:    Problems with blood clotting too  easily:        Skin    Rashes or ulcers:        Constitutional    Fever or chills:      PHYSICAL EXAM: Vitals:   02/16/22 1538  BP: 124/79  Pulse: 93  Resp: 16  Temp: (!) 97.3 F (36.3 C)  TempSrc: Temporal  SpO2: 96%  Weight: 208 lb 12.8 oz (94.7 kg)  Height: 5\' 8"  (1.727 m)    GENERAL: The patient is a well-nourished male, in no acute distress. The vital signs are documented above. CARDIOVASCULAR: 2+ dorsalis pedis pulse on the right PULMONARY: There is good air exchange  MUSCULOSKELETAL: There are no major deformities or cyanosis. NEUROLOGIC: No focal weakness or paresthesias are detected. SKIN: There are no ulcers or rashes noted.  Extensive scarring in his right medial and lateral leg with some hemosiderin deposit PSYCHIATRIC: The patient has a normal affect.  DATA:  None  MEDICAL ISSUES: Had long discussion with the patient.  Again explained that he is quite fortunate to have limb salvage.  He had a very unfavorable situation on presentation with a prolonged extensive ischemia.  He had questions regarding compartment syndromes and fasciotomies.  I reviewed all these with him.  I do not see any vascular indication for anticoagulation.  He reports that he takes daily aspirin and I feel that this is adequate.  I do not feel that he needs to be on Coumadin or DOAC from vascular standpoint.  He was reassured with this and will see again on an as-needed basis   [place], MD [facility] [phone] Pager [phone]  Note: Portions of this report may have been transcribed using voice recognition software.  Every effort has been made to ensure accuracy; however, inadvertent computerized transcription errors may still be present.

## 2022-02-22 ENCOUNTER — Ambulatory Visit (HOSPITAL_BASED_OUTPATIENT_CLINIC_OR_DEPARTMENT_OTHER): Admission: RE | Admit: 2022-02-22 | Payer: 59 | Source: Ambulatory Visit

## 2022-03-25 ENCOUNTER — Other Ambulatory Visit (HOSPITAL_COMMUNITY): Payer: Self-pay

## 2022-03-31 ENCOUNTER — Other Ambulatory Visit: Payer: Self-pay | Admitting: Family Medicine

## 2022-03-31 ENCOUNTER — Other Ambulatory Visit (HOSPITAL_COMMUNITY): Payer: Self-pay

## 2022-03-31 DIAGNOSIS — E785 Hyperlipidemia, unspecified: Secondary | ICD-10-CM

## 2022-03-31 MED ORDER — ATORVASTATIN CALCIUM 20 MG PO TABS
20.0000 mg | ORAL_TABLET | Freq: Every day | ORAL | 1 refills | Status: DC
  Filled 2022-03-31: qty 90, 90d supply, fill #0

## 2022-04-07 ENCOUNTER — Ambulatory Visit: Payer: 59 | Admitting: Family Medicine

## 2022-04-21 ENCOUNTER — Ambulatory Visit: Payer: 59 | Admitting: Family Medicine

## 2022-04-21 DIAGNOSIS — I5041 Acute combined systolic (congestive) and diastolic (congestive) heart failure: Secondary | ICD-10-CM

## 2022-04-21 DIAGNOSIS — E1165 Type 2 diabetes mellitus with hyperglycemia: Secondary | ICD-10-CM

## 2022-04-21 DIAGNOSIS — E785 Hyperlipidemia, unspecified: Secondary | ICD-10-CM

## 2022-04-28 ENCOUNTER — Ambulatory Visit (INDEPENDENT_AMBULATORY_CARE_PROVIDER_SITE_OTHER): Payer: 59 | Admitting: Family Medicine

## 2022-04-28 ENCOUNTER — Encounter: Payer: Self-pay | Admitting: Family Medicine

## 2022-04-28 ENCOUNTER — Other Ambulatory Visit (HOSPITAL_COMMUNITY): Payer: Self-pay

## 2022-04-28 VITALS — BP 134/76 | HR 95 | Temp 98.2°F | Ht 68.0 in | Wt 204.8 lb

## 2022-04-28 DIAGNOSIS — E1165 Type 2 diabetes mellitus with hyperglycemia: Secondary | ICD-10-CM | POA: Diagnosis not present

## 2022-04-28 DIAGNOSIS — R059 Cough, unspecified: Secondary | ICD-10-CM | POA: Diagnosis not present

## 2022-04-28 DIAGNOSIS — E083513 Diabetes mellitus due to underlying condition with proliferative diabetic retinopathy with macular edema, bilateral: Secondary | ICD-10-CM

## 2022-04-28 DIAGNOSIS — R21 Rash and other nonspecific skin eruption: Secondary | ICD-10-CM

## 2022-04-28 DIAGNOSIS — L299 Pruritus, unspecified: Secondary | ICD-10-CM | POA: Diagnosis not present

## 2022-04-28 LAB — POCT GLYCOSYLATED HEMOGLOBIN (HGB A1C): Hemoglobin A1C: 7.1 % — AB (ref 4.0–5.6)

## 2022-04-28 MED ORDER — SEMAGLUTIDE (1 MG/DOSE) 4 MG/3ML ~~LOC~~ SOPN
1.0000 mg | PEN_INJECTOR | SUBCUTANEOUS | 1 refills | Status: DC
  Filled 2022-04-28 – 2022-06-27 (×3): qty 9, 84d supply, fill #0

## 2022-04-28 MED ORDER — TRIAMCINOLONE ACETONIDE 0.1 % EX CREA
1.0000 | TOPICAL_CREAM | Freq: Two times a day (BID) | CUTANEOUS | 0 refills | Status: DC | PRN
  Filled 2022-04-28: qty 30, 15d supply, fill #0
  Filled 2022-05-13: qty 15, 30d supply, fill #0

## 2022-04-28 MED ORDER — PERMETHRIN 5 % EX CREA: 1.0000 | TOPICAL_CREAM | Freq: Once | CUTANEOUS | 0 refills | Status: AC

## 2022-04-28 NOTE — Addendum Note (Signed)
Addended by: Meredith Staggers R on: 04/28/2022 06:25 PM   Modules accepted: Orders

## 2022-04-28 NOTE — Patient Instructions (Signed)
No change in medications for now. For arm rash continue Aveeno lotion at least 1-2 times per day, avoid alcohol or hand sanitizer to arms as that may cause more drying and itching.  Triamcinolone cream can be used only for itching areas if needed.  Clean any open areas with soap and water at least once per day and cover with bandage if needed.  If the arm rash does not continue to improve in the next 1 week, I did write for the printed prescription to treat for possible scabies.  Again you do not need to use that medicine unless the rash does not continue to improve.  Exam was reassuring today.  If the cough does not resolve in the next 2 weeks, or you actually noticed blood in your nasal discharge, saliva, or in mucus with cough, follow-up to discuss other testing.

## 2022-04-28 NOTE — Progress Notes (Signed)
Subjective:  Patient ID: Troy Yu, male    DOB: 19-Mar-1961  Age: 61 y.o. MRN: [id number]  CC:  Chief Complaint  Patient presents with   Diabetes    Pt is fasting,    Insect Bite    Pt states he feels like he may have insect bites on his hands    Cough    Pt states he smells blood when he coughs     HPI Troy Yu presents for   Diabetes: History of hyperglycemia, microalbuminuria, ischemic cardiomyopathy followed by cardiology.  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     Previously had been treated with Ozempic 0.5 mg weekly then increase dose to 1 mg.  Some confusion regarding medications and had decreased to 0.5 mg, recommended he return to 1 mg dosing of Ozempic at his May visit.  Currently taking ozempic 1mg  per week, no new side effects, no n/v. He is on statin.  Lipitor 20 mg daily. Home readings fasting 153. Had not checked for few months.  Postprandial: unknown.  Symptomatic lows: none.  Microalbumin 4.8 on 10/20/2021. Watch has told him his A1c is 6.4 recently.   Lab Results  Component Value Date   HGBA1C 7.1 (A) 04/28/2022   HGBA1C 7.4 (H) 01/06/2022   HGBA1C 8.4 (H) 10/21/2021   Lab Results  Component Value Date   MICROALBUR 4.8 (H) 10/20/2021   LDLCALC 87 01/06/2022   CREATININE 0.95 01/06/2022   Hand itching: 10 days ago Removed climbing plant from office wall. Insects in plant? Had some itching in arms, and hands. Better now. Still some small bumps. No family members with rash. No other rash. Used leftover cream - min relief. Applying hand sanitizer to areas at times initially.  Different bed last month.   Cough: Past week. No runny nose, congestion.  Clear mucus at times. Minimal phlegm, dry cough at times.  Smells blood with cough, no blood in mucus, saliva.  Cough improved past few days.  No fever, no sick contacts.     History Patient Active Problem List   Diagnosis Date Noted   DCM  (dilated cardiomyopathy) (HCC) 12/30/2021   History of fasciotomy 06/07/2016   Status post coronary artery stent placement    Acute combined systolic and diastolic heart failure (HCC)    Abnormal CXR    Abdominal pain    Chest pain    NSTEMI (non-ST elevated myocardial infarction) (HCC)    Fever    Thromboembolism (HCC)    Vascular occlusion    Pain    Anterior subendocardial MI (HCC) 02/21/2016   Right leg pain 02/21/2016   Non-STEMI (non-ST elevated myocardial infarction) (HCC) 02/21/2016   LV (left ventricular) mural thrombus 02/21/2016   Elevated troponin 02/21/2016   Abnormal EKG 02/21/2016   Acute MI (HCC) 02/21/2016   History of embolectomy 02/21/2016   Diabetes (HCC)    Past Medical History:  Diagnosis Date   Diabetes (HCC)    Hypercholesteremia    LV (left ventricular) mural thrombus    NSTEMI (non-ST elevated myocardial infarction) [facility])    Past Surgical History:  Procedure Laterality Date   APPLICATION OF WOUND VAC Right 02/22/2016   Procedure: APPLICATION OF Negative Pressure Dressing Right Lower Leg;  Surgeon: 02/24/2016, MD;  Location: [facility] OR;  Service: Vascular;  Laterality: Right;   APPLICATION OF WOUND VAC Right 02/26/2016   Procedure: APPLICATION OF WOUND VAC, RIGHT MEDIAL  LOWER LEG FASCIOTOMY SITE;  Surgeon: Larina Earthly, MD;  Location: [facility] OR;  Service: Vascular;  Laterality: Right;   CARDIAC CATHETERIZATION N/A 03/09/2016   Procedure: Coronary Stent Intervention;  Surgeon: Iran Ouch, MD;  Location: [facility] INVASIVE CV LAB;  Service: Cardiovascular;  Laterality: N/A;  distal RCA mid LAD   CARDIAC CATHETERIZATION N/A 03/09/2016   Procedure: Intravascular Pressure Wire/FFR Study;  Surgeon: Iran Ouch, MD;  Location: [facility] INVASIVE CV LAB;  Service: Cardiovascular;  Laterality: N/A;  mid LAD   CARDIAC CATHETERIZATION N/A 03/09/2016   Procedure: Left Heart Cath and Coronary Angiography;  Surgeon: Iran Ouch, MD;  Location: [facility] INVASIVE CV LAB;  Service:  Cardiovascular;  Laterality: N/A;   EMBOLECTOMY Right 02/21/2016   Procedure: EMBOLECTOMY RIGHT COMMON FEMORAL ARTERY; FOUR COMPARTMENT FASCIOTOMY;  Surgeon: Fransisco Hertz, MD;  Location: [facility] OR;  Service: Vascular;  Laterality: Right;   FASCIOTOMY CLOSURE Right 02/26/2016   Procedure: RIGHT LATERAL LOWER LEG FASCIOTOMY CLOSURE;  Surgeon: Larina Earthly, MD;  Location: [facility] OR;  Service: Vascular;  Laterality: Right;   HEMATOMA EVACUATION Right 02/22/2016   Procedure: EVACUATION HEMATOMA With Placement of Negative Pressure Dressing;  Surgeon: Fransisco Hertz, MD;  Location: [facility] OR;  Service: Vascular;  Laterality: Right;   Allergies  Allergen Reactions   Metformin     Other reaction(s): Dizziness (intolerance)   Prior to Admission medications   Medication Sig Start Date End Date Taking? Authorizing Provider  aspirin EC 81 MG tablet Take 81 mg by mouth daily. Swallow whole.   Yes [provider]  atorvastatin (LIPITOR) 20 MG tablet Take 1 tablet (20 mg total) by mouth daily. 03/31/22  Yes Shade Flood, MD  glucose blood (TRUE METRIX BLOOD GLUCOSE TEST) test strip Use 1 strip to check blood sugar 2 times daily 01/11/22  Yes Shade Flood, MD  Semaglutide, 1 MG/DOSE, 4 MG/3ML SOPN Inject 1 mg into the skin once a week. 01/06/22  Yes Shade Flood, MD  diclofenac Sodium (VOLTAREN) 1 % GEL Apply 2 g topically 4 (four) times daily. Patient not taking: Reported on 02/16/2022 04/24/20   Georgetta Haber, NP   Social History   Socioeconomic History   Marital status: Married    Spouse name: [name]   Number of children: 5   Years of education: COLLEGE   Highest education level: Not on file  Occupational History   Occupation: UNEMPLOYED  Tobacco Use   Smoking status: Never   Smokeless tobacco: Never  Vaping Use   Vaping Use: Never used  Substance and Sexual Activity   Alcohol use: No   Drug use: No   Sexual activity: Not on file  Other Topics Concern   Not on file  Social History  Narrative   Not on file   Social Determinants of Health   Financial Resource Strain: Not on file  Food Insecurity: Not on file  Transportation Needs: Not on file  Physical Activity: Not on file  Stress: Not on file  Social Connections: Not on file  Intimate Partner Violence: Not on file    Review of Systems  Constitutional:  Negative for fatigue and unexpected weight change.  Eyes:  Negative for visual disturbance.  Respiratory:  Positive for cough. Negative for chest tightness and shortness of breath.   Cardiovascular:  Negative for chest pain, palpitations and leg swelling.  Gastrointestinal:  Negative for abdominal pain and blood in stool.  Skin:  Positive for rash.  Neurological:  Negative for dizziness, light-headedness and headaches.    Per hPI Objective:   Vitals:   04/28/22 1607  BP: 134/76  Pulse: 95  Temp: 98.2 F (36.8 C)  SpO2: 95%  Weight: 204 lb 12.8 oz (92.9 kg)  Height: 5\' 8"  (1.727 m)     Physical Exam Vitals reviewed.  Constitutional:      Appearance: He is well-developed.  HENT:     Head: Normocephalic and atraumatic.     Nose: Nose normal. No congestion or rhinorrhea.     Comments: No active bleeding or dried blood in nasal passages or mouth.    Mouth/Throat:     Mouth: Mucous membranes are moist.     Pharynx: Oropharynx is clear. No oropharyngeal exudate.  Neck:     Vascular: No carotid bruit or JVD.  Cardiovascular:     Rate and Rhythm: Normal rate and regular rhythm.     Heart sounds: Normal heart sounds. No murmur heard. Pulmonary:     Effort: Pulmonary effort is normal. No respiratory distress.     Breath sounds: No stridor. No wheezing, rhonchi or rales.  Musculoskeletal:     Right lower leg: No edema.     Left lower leg: No edema.  Skin:    General: Skin is warm and dry.     Comments: Multiple excoriated papules, few abraded areas, few umbilicated appearing lesions on arms bilaterally.  No significant surrounding erythema or  discharge.  Neurological:     Mental Status: He is alert and oriented to person, place, and time.  Psychiatric:        Mood and Affect: Mood normal.        Results for orders placed or performed in visit on 04/28/22  POCT glycosylated hemoglobin (Hb A1C)  Result Value Ref Range   Hemoglobin A1C 7.1 (A) 4.0 - 5.6 %   HbA1c POC (<> result, manual entry)     HbA1c, POC (prediabetic range)     HbA1c, POC (controlled diabetic range)       Assessment & Plan:  Troy Yu is a 61 y.o. male . 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  Recheck in 3 months.  Monitor diet, exercise and should be able to remain on same dose of meds as above.  Cough, unspecified type  -Minimal, improving, subjective smell of blood but no visible blood in phlegm, nasal discharge, or oral bleeding.  Reassuring exam at this time.  RTC precautions if persistent cough or any visible blood in secretions.  Consider imaging if persistent or worsening symptoms.  Pruritus - Plan: triamcinolone cream (KENALOG) 0.1 % Rash and nonspecific skin eruption - Plan: triamcinolone cream (KENALOG) 0.1 %, permethrin (ELIMITE) 5 % cream  -Acute onset of pruritus after lifting plant at work, possible contact dermatitis versus insect bites.  Limited to arms, few on hands.  No other body involvement.  Less likely scabies.  Itching has improved.  Multiple areas with excoriation.  May have potentially worsened with use of alcohol hand sanitizer, over drying.  -Avoid alcohol/hand sanitizer due to drying effect.  -Continue Aveeno lotion, currently daily can increase to multiple times per day if needed.  -Triamcinolone topical to pruritic areas to minimize excoriation.  -Option to start Elimite  if not continuing to improve  for possible but less likely scabies.  RTC precautions  Meds ordered this encounter  Medications   triamcinolone cream (KENALOG) 0.1 %    Sig: Apply 1 Application topically 2 (two) times daily as needed. If needed to itching areas.    Dispense:  30 g    Refill:  0   permethrin (ELIMITE) 5 % cream    Sig: Apply 1 Application topically once for 1 dose. Rinse in 8 hours. Avoid eyes,nose, mouth.    Dispense:  60 g    Refill:  0   Patient Instructions  No change in medications for now. For arm rash continue Aveeno lotion at least 1-2 times per day, avoid alcohol or hand sanitizer to arms as that may cause more drying and itching.  Triamcinolone cream can be used only for itching areas if needed.  Clean any open areas with soap and water at least once per day and cover with bandage if needed.  If the arm rash does not continue to improve in the next 1 week, I did write for the printed prescription to treat for possible scabies.  Again you do not need to use that medicine unless the rash does not continue to improve.  Exam was reassuring today.  If the cough does not resolve in the next 2 weeks, or you actually noticed blood in your nasal discharge, saliva, or in mucus with cough, follow-up to discuss other testing.     Signed,   Meredith Staggers, MD [facility], [facility] 04/28/22 5:55 PM

## 2022-04-29 ENCOUNTER — Other Ambulatory Visit (HOSPITAL_COMMUNITY): Payer: Self-pay

## 2022-05-05 ENCOUNTER — Encounter: Payer: Commercial Managed Care - HMO | Admitting: Family Medicine

## 2022-05-06 ENCOUNTER — Encounter: Payer: Self-pay | Admitting: Family Medicine

## 2022-05-06 NOTE — Progress Notes (Unsigned)
Patient came in to see Dr Neva Seat on 05/05/2022, prior to coming in [place] contacted the patient to ask if he truly needed to be seen because he had just been seen 8/31 for the same thing he stated he was coming in for on 05/05/2022. Patient stated at the time of the call that he did need to come in to be seen. After arrival he stated he didn't know why he was here and he didn't need to be seen for his diabetes or gallbladder. He began to go back and forth with Thea Silversmith about how he's the patient and we're the ones that should know what he's here for. Thea Silversmith attempted to remind him that's why she called was to let him know that he didn't need this appt and to see if he needed to be seen for anything else. He continued to have an attitude with Thea Silversmith. Patient stated "he would appreciate it if we let him know what he's coming in for before his appt." Darrol Poke felt his tone and attitude was uncalled for to the point of where she almost went to retrieve Dr Neva Seat. Patient stormed out of the office and his appt was cancelled.

## 2022-05-10 ENCOUNTER — Other Ambulatory Visit (HOSPITAL_COMMUNITY): Payer: Self-pay

## 2022-05-13 ENCOUNTER — Other Ambulatory Visit (HOSPITAL_COMMUNITY): Payer: Self-pay

## 2022-05-19 LAB — COLOGUARD

## 2022-05-26 ENCOUNTER — Other Ambulatory Visit (HOSPITAL_COMMUNITY): Payer: Self-pay

## 2022-05-26 MED ORDER — VYZULTA 0.024 % OP SOLN
1.0000 [drp] | Freq: Every evening | OPHTHALMIC | 1 refills | Status: DC
  Filled 2022-05-26: qty 2.5, 25d supply, fill #0
  Filled 2022-05-31: qty 2.5, 50d supply, fill #0
  Filled 2022-06-23: qty 2.5, 25d supply, fill #0
  Filled 2022-06-27: qty 2.5, 30d supply, fill #0
  Filled 2022-09-16: qty 2.5, 28d supply, fill #0
  Filled 2022-11-09: qty 2.5, 25d supply, fill #0
  Filled 2023-03-03: qty 2.5, 30d supply, fill #0

## 2022-05-30 ENCOUNTER — Other Ambulatory Visit (HOSPITAL_COMMUNITY): Payer: Self-pay

## 2022-05-31 ENCOUNTER — Other Ambulatory Visit (HOSPITAL_COMMUNITY): Payer: Self-pay

## 2022-06-23 ENCOUNTER — Telehealth: Payer: Self-pay

## 2022-06-23 ENCOUNTER — Other Ambulatory Visit (HOSPITAL_COMMUNITY): Payer: Self-pay

## 2022-06-23 NOTE — Telephone Encounter (Signed)
Pharmacy Patient Advocate Encounter   Received notification from [employer] that prior authorization for Ozempic (1 MG/DOSE) 4MG /3ML pen-injectors is required/requested.  PA submitted on 06/23/2022 to [employer] via CoverMyMeds Key [id number]  Status is pending

## 2022-06-25 ENCOUNTER — Other Ambulatory Visit (HOSPITAL_COMMUNITY): Payer: Self-pay

## 2022-06-27 ENCOUNTER — Other Ambulatory Visit (HOSPITAL_COMMUNITY): Payer: Self-pay

## 2022-06-28 ENCOUNTER — Other Ambulatory Visit (HOSPITAL_COMMUNITY): Payer: Self-pay

## 2022-06-28 NOTE — Telephone Encounter (Signed)
Patient called today and notes that he went to the pharmacy and the pharmacist told him that the PA was denied can we see about appeal? Patient has been on this medication for some time now

## 2022-06-29 NOTE — Telephone Encounter (Signed)
Pharmacy Patient Advocate Encounter  Received notification from [employer] that the request for prior authorization for Ozempic (1 MG/DOSE) 4MG /3ML pen-injectors has been denied due to There is nothing to support that the individual has had failure, contraindication, or intolerance to the  covered alternative, Trulicity (dulaglutide).     Key:  [id number]  This determination is currently being reviewed for appeal.    Denial letter is attached to patient's chart

## 2022-06-30 ENCOUNTER — Other Ambulatory Visit (HOSPITAL_COMMUNITY): Payer: Self-pay

## 2022-06-30 MED ORDER — LATANOPROST 0.005 % OP SOLN
1.0000 [drp] | Freq: Every evening | OPHTHALMIC | 6 refills | Status: DC
  Filled 2022-06-30 – 2022-11-09 (×2): qty 2.5, 25d supply, fill #0

## 2022-06-30 NOTE — Telephone Encounter (Signed)
Pharmacy Patient Advocate Encounter  Received notification from [place] that the appeal for Ozempic (1 MG/DOSE) 4MG /3ML pen-injectors has been denied.   Patient will need to try Trulicity before approval of Ozempic.

## 2022-07-01 ENCOUNTER — Other Ambulatory Visit: Payer: Self-pay

## 2022-07-01 ENCOUNTER — Other Ambulatory Visit (HOSPITAL_COMMUNITY): Payer: Self-pay

## 2022-07-01 DIAGNOSIS — E1165 Type 2 diabetes mellitus with hyperglycemia: Secondary | ICD-10-CM

## 2022-07-01 MED ORDER — TRULICITY 3 MG/0.5ML ~~LOC~~ SOAJ
3.0000 mg | SUBCUTANEOUS | 1 refills | Status: DC
  Filled 2022-07-01: qty 6, 84d supply, fill #0

## 2022-07-01 MED ORDER — OZEMPIC (1 MG/DOSE) 4 MG/3ML ~~LOC~~ SOPN
1.0000 mg | PEN_INJECTOR | SUBCUTANEOUS | 0 refills | Status: DC
  Filled 2022-07-01: qty 3, 28d supply, fill #0

## 2022-07-01 NOTE — Telephone Encounter (Signed)
Noted.  Trulicity ordered at 3 mg dose which should be similar to his Ozempic dosing.  When he is due for next injection, switch to Trulicity.  Make sure he understands reason that this medication was changed due to insurance coverage and let me know if there are questions.

## 2022-07-01 NOTE — Telephone Encounter (Signed)
Patient will need to try trulicity due to change with insurance formulary

## 2022-07-01 NOTE — Telephone Encounter (Signed)
Called patient and informed him of this he has requested one more refill for Ozempic as he was authorized for an emergency refill while he is working with his insurance as well as with the [employer] program that is available. 1 refill sent and patient will call back with determination

## 2022-07-01 NOTE — Addendum Note (Signed)
Addended by: Merri Ray R on: 07/01/2022 10:16 AM   Modules accepted: Orders

## 2022-07-05 NOTE — Telephone Encounter (Signed)
Pt called very frustrated about his new medication Trulicity.  Pt states that he want to speak with Dr.Greene about this medication. I tried to schedule appt of pt. Pt refuse the appt. Pt states that he just want Dr.Greene to give him a call.

## 2022-07-06 NOTE — Telephone Encounter (Signed)
Patient insisits on a phone call from you about medication change, I did explain to him when I had called him that this was not something we controlled that his insurance changed and they were refusing this medication. Please advise I can call and attempt to schedule again?

## 2022-07-06 NOTE — Telephone Encounter (Signed)
I can imagine he is frustrated with having to change medications, but that is related to his insurance and coverage.  I am glad we were able to send a temporary refill of Ozempic while he works with his insurance, but not sure I can provide any more information on a phone call, as it has already been sent through prior authorization and denied.  If he takes Trulicity and has intolerance, or that is not effective, that would provide a possible route to going back to the Ozempic, but appears his insurance will only cover that medication if he fails Trulicity. If he still needs a call, let me know, but again not sure I can help any further when his insurance has made this decision.

## 2022-07-07 NOTE — Telephone Encounter (Signed)
Please review information about patients Heart attack in 2017 this may change outcome of PA denial for ozempic

## 2022-07-07 NOTE — Telephone Encounter (Signed)
Please forward to prior auth team to see if additional info will be of any help. Thanks.

## 2022-07-07 NOTE — Telephone Encounter (Signed)
Called patient to discuss he again declined changing stating he thinks if we provide information about his Heart attack in 2017 then there is a better chance of approval due to Ozempic having benefit of reduced heart attack risk on this medication and insurance had told him that concerns for heart disease or any vascular diseases may change the outcome and patient has insisted on asking due to him having being on the ozempic for nearly two years

## 2022-07-08 NOTE — Telephone Encounter (Signed)
Patient would like appeal level 2 to be submitted and is aware

## 2022-07-08 NOTE — Telephone Encounter (Signed)
Appreciate this information - please advise patient of this info including CV benefit with trulicity, and determine if he would like to proceed with level 2 appeal that may take 30 days for determination.

## 2022-07-12 ENCOUNTER — Other Ambulatory Visit (HOSPITAL_COMMUNITY): Payer: Self-pay

## 2022-07-12 NOTE — Telephone Encounter (Signed)
Pt called back to get a updated about his appeal. I told pt that we received the 2nd appeal request yesterday. Pt states that he has been out of medication for 2 weeks now.

## 2022-07-20 ENCOUNTER — Telehealth: Payer: Self-pay | Admitting: Family Medicine

## 2022-07-20 NOTE — Telephone Encounter (Signed)
Patient called this morning stating that he has been out of medication for a month now. Patient has talked to his insurance. Patient states that his insurance still hasn't received anything.  Is there any way someone from PA team can contact this patient, so this patient can get a better understanding of what going on with his Ozempic appeal?  Patient best contact number is [phone].

## 2022-07-20 NOTE — Telephone Encounter (Signed)
I did take over call from [facility] due to her being unable to solve the patients issue.  I did hear most of the conversation discussing the message to pharmacy team requesting he be reach out to and explained to him. Declined again stating Dr Neva Seat has to submit forms and Rosann Auerbach states they haven't been. I again stated I had no idea as we do not handle PA in office anymore and that is why I wanted him to wait for a call from pharmacy. He declined stating again he wished to speak to Dr Neva Seat. At this time it had been 32 min on the phone between [facility] and myself I informed him at this time we needed to make an appointment or wait for pharmacies call as we were not getting anywhere with these same questions. He got upset said he shouldn't be in danger this way and that he has been out of his medication I reminded him he declined the alternative we offered and stated he would wait for PA to be completed and he raised his voice more saying he refused to be a [place] pig for a trial I told him Trulicity is not a trial drug has been very successful and many patients like this medication he cut me off and said he does not want that he wants his medication I told him I sympathize but I had no control over his insurance denying this. Again states his life is being threatened by our inaction I again stated we are doing the best we can but he was offered and alternative and he declined this so we had no other recommendations for him. He got very upset demanded to speak to Neva Seat I stated he can speak to him by making an appointment.  Patient began yelling I offered 4 warnings that if he could not calm down and lower his voice I would disconnect the call this made him more irate and he said you see what happens. I disconnected the call, patient immediately called back I answered the phone he yelled at me he would not speak to ME wanted someone else I told him he needed to calm down or no one would speak to him, he said he  is calm this is just how he talks and I stated I know him and this is not how he sounds when he is calm, insisted again on another employee I stated I would transfer him no where while he spoke like this. He said "I don't want to speak to a woman, I apologize but I do not trust women" I did tell the patient this is not fair of him he has contacted a doctors office of all women who are just here to help him but he is making it difficult. Patient seemed to calm after a moment. Patient then stated he is worried about his medication and his sugar numbers and I voiced that I sympathize and I want him to have his medication too but I don't have the answers he wants, I again offered the apportionment to speak with DR Neva Seat he reluctantly accepted and then voiced his displeasure having to wait till Monday "for a phone call" I wished the patient happy holidays and apologized it has been stressful for him.

## 2022-07-20 NOTE — Telephone Encounter (Signed)
Initial Comment Caller states he needs an authorization for his medication. His blood sugar is 290 now. Translation No Disp. Time [name] Disposition Final User 07/19/2022 5:31:58 PM Attempt made - message left York Cerise 07/19/2022 5:48:08 PM Attempt made - message left Noelle Penner, RN, [place] 07/19/2022 6:07:00 PM FINAL ATTEMPT MADE - message left Yes Noelle Penner RN, [place] Final Disposition 07/19/2022 6:07:00 PM FINAL ATTEMPT MADE - message left Yes Noelle Penner, RN, [place]

## 2022-07-25 ENCOUNTER — Encounter: Payer: Self-pay | Admitting: Family Medicine

## 2022-07-25 ENCOUNTER — Other Ambulatory Visit (HOSPITAL_COMMUNITY): Payer: Self-pay

## 2022-07-25 ENCOUNTER — Ambulatory Visit (INDEPENDENT_AMBULATORY_CARE_PROVIDER_SITE_OTHER): Payer: Commercial Managed Care - HMO | Admitting: Family Medicine

## 2022-07-25 VITALS — BP 130/70 | HR 73 | Temp 98.2°F | Ht 68.0 in | Wt 211.2 lb

## 2022-07-25 DIAGNOSIS — E1165 Type 2 diabetes mellitus with hyperglycemia: Secondary | ICD-10-CM

## 2022-07-25 DIAGNOSIS — R062 Wheezing: Secondary | ICD-10-CM | POA: Diagnosis not present

## 2022-07-25 DIAGNOSIS — E785 Hyperlipidemia, unspecified: Secondary | ICD-10-CM

## 2022-07-25 LAB — COLOGUARD: COLOGUARD: NEGATIVE

## 2022-07-25 MED ORDER — ATORVASTATIN CALCIUM 20 MG PO TABS
20.0000 mg | ORAL_TABLET | Freq: Every day | ORAL | 1 refills | Status: DC
  Filled 2022-07-25: qty 90, 90d supply, fill #0

## 2022-07-25 MED ORDER — TRULICITY 3 MG/0.5ML ~~LOC~~ SOAJ
3.0000 mg | SUBCUTANEOUS | 1 refills | Status: DC
  Filled 2022-07-25: qty 6, 84d supply, fill #0

## 2022-07-25 MED ORDER — FLUTICASONE PROPIONATE 50 MCG/ACT NA SUSP
1.0000 | Freq: Every day | NASAL | 6 refills | Status: DC
  Filled 2022-07-25: qty 16, 60d supply, fill #0
  Filled 2022-11-09: qty 16, 60d supply, fill #1

## 2022-07-25 NOTE — Progress Notes (Unsigned)
Subjective:  Patient ID: Troy Yu, male    DOB: March 15, 1961  Age: 61 y.o. MRN: [id number]  CC:  Chief Complaint  Patient presents with   Diabetes    Pt states he has been out of his medication for 1 month last night his sugar was 300 Pt was on the phone with insurance and would not answer my questions and I had the next patient waiting so I went ahead and roomed the next pt and came back to him   Wheezing    Pt has wheezing in chest at times, worse ar night can cough and clear his throat     HPI Troy Yu presents for   Diabetes: With hyperglycemia, microalbuminuria, history of ischemic cardiomyopathy followed by cardiology.  See prior phone notes.  Had been treated with Ozempic in the past, 1 mg/week at his last visit with me in August.  On statin Lipitor 20 mg daily at that time.  Borderline control with A1c 7.1, continued to diet, exercise and remained on Ozempic 1 mg. Has been having difficulty with medication.  See notes from prior authorization pain.  October 26 prior Auth was submitted for his Ozempic.  [employer] denied prior Auth for Ozempic, as they did not have info to support the individual had failure, contraindication or intolerance to the covered alternative Trulicity.  They did submit appeal.  Appeal was also denied.  We will need to try Trulicity before approval of Ozempic.  I ordered Trulicity at 3 mg dose on November 3.  Additional emergency refill given of his Ozempic on November 3.  He declined trial of Trulicity.  Of note the prior Auth team did provide notes that did show patient's cardiovascular history.  Patient requested level 2 appeal.  Patient advised of course and progress above.  Has new insurance starting January 1st.  Out of meds for a few weeks.  Fasting 230 this am Postprandial in 300's. Agrees to try trulicity.  Risks of not treating diabetes discussed.  Denies nausea, vomiting, abdominal pain or change in vision.  Lab  Results  Component Value Date   HGBA1C 7.1 (A) 04/28/2022   HGBA1C 7.4 (H) 01/06/2022   HGBA1C 8.4 (H) 10/21/2021   Lab Results  Component Value Date   MICROALBUR 4.8 (H) 10/20/2021   LDLCALC 87 01/06/2022   CREATININE 0.95 01/06/2022   Wheezing Past 2 months, comes and goes, different times, usually noted at bedtime. Some cough and noise in chest. Cough and clears throat during day at times. Clear liquid at times. Feels better. No PND/nasal congestion. No heartburn. No chest pain, no dyspnea. No DOE. No hx of asthma, allergies.  No leg swelling.   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, without long-term current use of insulin (HCC) 11/16/2016   Chronic systolic congestive heart failure (HCC) 11/16/2016   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/2016   Noncompliance 11/16/2016   Old MI (myocardial infarction) 11/16/2016   Pure hypercholesterolemia 11/16/2016   History of fasciotomy 06/07/2016   Status post  coronary artery stent placement    Acute combined systolic and diastolic heart failure (HCC)    Abnormal CXR    Abdominal pain    Chest pain    NSTEMI (non-ST elevated myocardial infarction) (HCC)    Fever    Thromboembolism (HCC)    Vascular occlusion    Pain    Anterior subendocardial MI (HCC) 02/21/2016   Right leg pain 02/21/2016   Non-STEMI (non-ST elevated myocardial infarction) (HCC) 02/21/2016   LV (left ventricular) mural thrombus 02/21/2016   Elevated troponin 02/21/2016   Abnormal EKG  02/21/2016   Acute MI (HCC) 02/21/2016   History of embolectomy 02/21/2016   Past Medical History:  Diagnosis Date   Diabetes (HCC)    Hypercholesteremia    LV (left ventricular) mural thrombus    NSTEMI (non-ST elevated myocardial infarction) [facility])    Past Surgical History:  Procedure Laterality Date   APPLICATION OF WOUND VAC Right 02/22/2016   Procedure: APPLICATION OF Negative Pressure Dressing Right Lower Leg;  Surgeon: Fransisco Hertz, MD;  Location: [facility] OR;  Service: Vascular;  Laterality: Right;   APPLICATION OF WOUND VAC Right 02/26/2016   Procedure: APPLICATION OF WOUND VAC, RIGHT MEDIAL LOWER LEG FASCIOTOMY SITE;  Surgeon: Larina Earthly, MD;  Location: [facility] OR;  Service: Vascular;  Laterality: Right;   CARDIAC CATHETERIZATION N/A 03/09/2016   Procedure: Coronary Stent Intervention;  Surgeon: Iran Ouch, MD;  Location: [facility] INVASIVE CV LAB;  Service: Cardiovascular;  Laterality: N/A;  distal RCA mid LAD   CARDIAC CATHETERIZATION N/A 03/09/2016   Procedure: Intravascular Pressure Wire/FFR Study;  Surgeon: Iran Ouch, MD;  Location: [facility] INVASIVE CV LAB;  Service: Cardiovascular;  Laterality: N/A;  mid LAD   CARDIAC CATHETERIZATION N/A 03/09/2016   Procedure: Left Heart Cath and Coronary Angiography;  Surgeon: Iran Ouch, MD;  Location: [facility] INVASIVE CV LAB;  Service: Cardiovascular;  Laterality: N/A;   EMBOLECTOMY Right 02/21/2016   Procedure: EMBOLECTOMY RIGHT COMMON FEMORAL ARTERY; FOUR COMPARTMENT FASCIOTOMY;  Surgeon: Fransisco Hertz, MD;  Location: [facility] OR;  Service: Vascular;  Laterality: Right;   FASCIOTOMY CLOSURE Right 02/26/2016   Procedure: RIGHT LATERAL LOWER LEG FASCIOTOMY CLOSURE;  Surgeon: Larina Earthly, MD;  Location: [facility] OR;  Service: Vascular;  Laterality: Right;   HEMATOMA EVACUATION Right 02/22/2016   Procedure: EVACUATION HEMATOMA With Placement of Negative Pressure Dressing;  Surgeon: Fransisco Hertz, MD;  Location: [facility] OR;  Service: Vascular;  Laterality: Right;    Allergies  Allergen Reactions   Metformin     Other reaction(s): Dizziness (intolerance)   Prior to Admission medications   Medication Sig Start Date End Date Taking? Authorizing Provider  aspirin EC 81 MG tablet Take 81 mg by mouth daily. Swallow whole.   Yes [provider]  atorvastatin (LIPITOR) 20 MG tablet Take 1 tablet (20 mg total) by mouth daily. 03/31/22  Yes Shade Flood, MD  glucose blood (TRUE METRIX BLOOD GLUCOSE TEST) test strip Use 1 strip to check blood sugar 2 times daily 01/11/22  Yes Shade Flood, MD  latanoprost (XALATAN) 0.005 % ophthalmic solution Place 1 drop into both eyes every evening as directed. 06/30/22  Yes   Latanoprostene Bunod (VYZULTA) 0.024 % SOLN Place 1 drop into both eyes every evening as directed 05/26/22  Yes   Semaglutide, 1 MG/DOSE, (OZEMPIC, 1 MG/DOSE,) 4 MG/3ML SOPN Inject 1 mg into the skin once a week. 07/01/22  Yes Shade Flood, MD  diclofenac Sodium (VOLTAREN)  1 % GEL Apply 2 g topically 4 (four) times daily. Patient not taking: Reported on 02/16/2022 04/24/20   Linus Mako B, NP  Dulaglutide (TRULICITY) 3 MG/0.5ML SOPN Inject 3 mg as directed once a week. Patient not taking: Reported on 07/25/2022 07/01/22   Shade Flood, MD  triamcinolone cream (KENALOG) 0.1 % Apply 1 Application topically 2 (two) times daily as needed. If needed to itching areas. Patient not taking: Reported on 07/25/2022 04/28/22   Shade Flood, MD   Social History   Socioeconomic History   Marital status: Married    Spouse name: [name]   Number of children: 5   Years of education: COLLEGE   Highest education level: Not on file  Occupational History   Occupation: UNEMPLOYED  Tobacco Use   Smoking status: Never   Smokeless tobacco: Never  Vaping Use   Vaping Use: Never used  Substance and Sexual Activity   Alcohol use: No   Drug use: No   Sexual activity: Not on file  Other Topics Concern   Not on file  Social History Narrative    Not on file   Social Determinants of Health   Financial Resource Strain: Not on file  Food Insecurity: Not on file  Transportation Needs: Not on file  Physical Activity: Not on file  Stress: Not on file  Social Connections: Not on file  Intimate Partner Violence: Not on file    Review of Systems Per HPI  Objective:   Vitals:   07/25/22 0910  BP: 130/70  Pulse: 73  Temp: 98.2 F (36.8 C)  SpO2: 98%  Weight: 211 lb 3.2 oz (95.8 kg)  Height: 5\' 8"  (1.727 m)     Physical Exam Vitals reviewed.  Constitutional:      Appearance: He is well-developed.  HENT:     Head: Normocephalic and atraumatic.     Nose:     Comments: Sinuses nontender Neck:     Vascular: No carotid bruit or JVD.  Cardiovascular:     Rate and Rhythm: Normal rate and regular rhythm.     Heart sounds: Normal heart sounds. No murmur heard. Pulmonary:     Effort: Pulmonary effort is normal. No respiratory distress.     Breath sounds: Normal breath sounds. No stridor. No wheezing or rales.  Musculoskeletal:     Right lower leg: No edema.     Left lower leg: No edema.  Skin:    General: Skin is warm and dry.  Neurological:     Mental Status: He is alert and oriented to person, place, and time.  Psychiatric:        Mood and Affect: Mood normal.      41 minutes spent during visit, including chart review, counseling and assimilation of information, discussion of Ozempic, prior authorization and alternatives, concerns regarding his diabetes control, exam, discussion of plan, and chart completion.    Assessment & Plan:  Ammiel AL Micki Riley is a 61 y.o. male . Wheezing - Plan: DG Chest 2 View, fluticasone (FLONASE) 50 MCG/ACT nasal spray  -Lungs clear on exam, I do not hear any appreciable wheeze will check chest x-ray, trial of Flonase for possible postnasal drip/upper airway.  RTC precautions.  Hyperlipidemia, unspecified hyperlipidemia type - Plan: atorvastatin (LIPITOR) 20 MG tablet  -Current  dose Lipitor, continue same.  Type 2 diabetes mellitus with hyperglycemia, without long-term current use of insulin (HCC) - Plan: Dulaglutide (TRULICITY) 3 MG/0.5ML SOPN  -We discussed at length today the  previous work through our office and pharmacy team for prior authorization of Ozempic.  I did show him some of the notes that have been provided from the pharmacy team for prior authorization.  Ozempic was denied as well as the appeal, and in second appeal currently.  I discussed the criteria for having Ozempic covered and understanding was expressed.  I did discuss the similar mechanism of Trulicity and that that medicine should work well, and recommended he start that as soon as possible due to possible risks of untreated diabetes and elevated readings at home.  Alternative option was to pay for Ozempic out-of-pocket.  New prescription was provided for Trulicity.  We also discussed most recent phone encounter with staff at our office and our team and expectations.  He did report that some of his comments were not taken as intended, and plans to continue follow-up with [place], and appreciates care that has been provided.  -Recheck 1 month, ER/RTC precautions in the meantime.  Meds ordered this encounter  Medications   atorvastatin (LIPITOR) 20 MG tablet    Sig: Take 1 tablet (20 mg total) by mouth daily.    Dispense:  90 tablet    Refill:  1   fluticasone (FLONASE) 50 MCG/ACT nasal spray    Sig: Place 1 spray into both nostrils daily.    Dispense:  16 g    Refill:  6   Dulaglutide (TRULICITY) 3 MG/0.5ML SOPN    Sig: Inject 3 mg as directed once a week.    Dispense:  6 mL    Refill:  1   Patient Instructions  For wheeze - please have xray performed at location below. Try flonase nasal spray. Recheck in next 2 weeks.  Return to the clinic or go to the nearest emergency room if any of your symptoms worsen or new symptoms occur. As we discussed, Ozempic is not covered by your insurance, you can  either pay out of pocket for that medication at this time or start the trulicity as we discussed. I prescribed that again today.  I am concerned about your elevated readings - please start medicine today.  If you have any nausea, vomiting, abdominal pain or new symptoms be seen right away.  Recheck in 1 month and we can do labs at that time.  [place] [facility]  Walk in 8:30-4:30 during weekdays, no appointment needed [address]  [place], [place] [address]     Signed,   Meredith Staggers, MD [facility], [facility] 07/25/22 9:52 AM

## 2022-07-25 NOTE — Patient Instructions (Addendum)
For wheeze - please have xray performed at location below. Try flonase nasal spray. Recheck in next 2 weeks.  Return to the clinic or go to the nearest emergency room if any of your symptoms worsen or new symptoms occur. As we discussed, Ozempic is not covered by your insurance, you can either pay out of pocket for that medication at this time or start the trulicity as we discussed. I prescribed that again today.  I am concerned about your elevated readings - please start medicine today.  If you have any nausea, vomiting, abdominal pain or new symptoms be seen right away.  Recheck in 1 month and we can do labs at that time.  [facility] [facility]  Walk in 8:30-4:30 during weekdays, no appointment needed [address]  [place], [place] [address]

## 2022-07-26 ENCOUNTER — Encounter: Payer: Self-pay | Admitting: Family Medicine

## 2022-07-27 NOTE — Telephone Encounter (Signed)
Patient has attached a letter with information that may effect his appeal

## 2022-07-28 NOTE — Telephone Encounter (Addendum)
Called and spoke to another insurance rep. Level 2 appeal is appropriate. Pharmacist resent level 2 appeal to a different fax number given by the insurance rep

## 2022-08-19 ENCOUNTER — Other Ambulatory Visit (HOSPITAL_COMMUNITY): Payer: Self-pay

## 2022-08-24 ENCOUNTER — Ambulatory Visit: Payer: Commercial Managed Care - HMO | Admitting: Family Medicine

## 2022-08-31 ENCOUNTER — Ambulatory Visit: Payer: PRIVATE HEALTH INSURANCE | Admitting: Family Medicine

## 2022-08-31 ENCOUNTER — Encounter: Payer: Self-pay | Admitting: Family Medicine

## 2022-08-31 ENCOUNTER — Other Ambulatory Visit (HOSPITAL_COMMUNITY): Payer: Self-pay

## 2022-08-31 VITALS — BP 138/70 | HR 94 | Temp 98.6°F | Ht 68.0 in | Wt 212.4 lb

## 2022-08-31 DIAGNOSIS — E1165 Type 2 diabetes mellitus with hyperglycemia: Secondary | ICD-10-CM | POA: Diagnosis not present

## 2022-08-31 DIAGNOSIS — M25561 Pain in right knee: Secondary | ICD-10-CM | POA: Diagnosis not present

## 2022-08-31 MED ORDER — SEMAGLUTIDE (1 MG/DOSE) 4 MG/3ML ~~LOC~~ SOPN
1.0000 mg | PEN_INJECTOR | SUBCUTANEOUS | 3 refills | Status: DC
  Filled 2022-08-31: qty 3, 28d supply, fill #0

## 2022-08-31 NOTE — Patient Instructions (Addendum)
I prescribed Ozempic, same dose you were on previously.  Stop Trulicity.  If the upset stomach, or feeling of food not digesting does not improve with switch to Ozempic, return for recheck as you may need to see a stomach specialist. Return to the clinic or go to the nearest emergency room if any of your symptoms worsen or new symptoms occur.   Over the counter Voltaren gel if needed for knee pain. You may have some arthritis.  Xray of knee at location below.  [place] [place] [facility]  Walk in 8:30-4:30 during weekdays, no appointment needed [address]  [place], [place] [address] If continued knee pain let me know and I can send in referral to an orthopaedic doctor.   Take care.

## 2022-08-31 NOTE — Progress Notes (Signed)
Subjective:  Patient ID: Troy Yu, male    DOB: [birth year]  Age: 62 y.o. MRN: [id number]  CC:  Chief Complaint  Patient presents with   Diabetes    Started trulicity, pt notes still having higher Bg feels issues with stomach, no nausea feels like food is sitting on top of his stomach not digesting   New insurance covers ozempic pt would like to switch back    Knee Pain    Pt has an old knee injurt from child hood to the Rt knee has had some recent pain wasn't sure if he should see a specialist of get Xray     HPI Troy Yu presents for Multiple concerns above  Diabetes: With hyperglycemia.  Microalbuminuria, ischemic cardiomyopathy followed by cardiology.  Treated with Ozempic previously, but not covered with insurance.  We have gone through multiple prior authorizations and appeals, recommended other covered medications, and ultimately agreed to try Trulicity after discussion in November.  His blood sugar had been elevated up in the 200s and 300s off of meds.  Had been off meds for a few weeks when discussed in November.  Started Trulicity 3 mg at November 27 visit. Has taken 5 doses.  Still having high readings - 169, 170, no 300's.   As above feels like he is having higher blood sugars and some stomach issues, feels like his food is not digesting but no vomiting, no nausea. He would like to try switching back to Ozempic.  He has a new insurance at this time.  Right knee pain Past few weeks.  Episodic pain, no locking or giving way. Sore on inside of knee. Had old twisting injury of R knee, no surgery. Childhood injury of knee as well.  No treatments. No meds.    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 associated with type 2 diabetes mellitus (HCC) 01/23/2018   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, without long-term current use of insulin (HCC) 11/16/2016   Chronic systolic congestive heart failure (HCC) 11/16/2016   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/2016   Noncompliance 11/16/2016   Old MI (myocardial infarction) 11/16/2016   Pure hypercholesterolemia 11/16/2016   History of fasciotomy 06/07/2016   Status post coronary artery stent placement    Acute combined systolic and diastolic heart failure (HCC)    Abnormal CXR    Abdominal pain    Chest pain    NSTEMI (non-ST elevated myocardial infarction) (HCC)    Fever    Thromboembolism (HCC)    Vascular occlusion    Pain    Anterior subendocardial MI (HCC) 02/21/2016   Right leg pain 02/21/2016   Non-STEMI (non-ST elevated myocardial infarction) (HCC) 02/21/2016   LV (left ventricular) mural thrombus 02/21/2016   Elevated troponin 02/21/2016   Abnormal EKG 02/21/2016   Acute MI (  [place]) 02/21/2016   History of embolectomy 02/21/2016   Past Medical History:  Diagnosis Date   Diabetes ([place])    Hypercholesteremia    LV (left ventricular) mural thrombus    NSTEMI (non-ST elevated myocardial infarction) [facility])    Past Surgical History:  Procedure Laterality Date   APPLICATION OF WOUND VAC Right 02/22/2016   Procedure: APPLICATION OF Negative Pressure Dressing Right Lower Leg;  Surgeon: Conrad Keller, MD;  Location: [place];  Service: Vascular;  Laterality: Right;   APPLICATION OF WOUND VAC Right 02/26/2016   Procedure: APPLICATION OF WOUND VAC, RIGHT  MEDIAL LOWER LEG FASCIOTOMY SITE;  Surgeon: Rosetta Posner, MD;  Location: [place];  Service: Vascular;  Laterality: Right;   CARDIAC CATHETERIZATION N/A 03/09/2016   Procedure: Coronary Stent Intervention;  Surgeon: Wellington Hampshire, MD;  Location: [place] CV [facility];  Service: Cardiovascular;  Laterality: N/A;  distal RCA mid LAD   CARDIAC CATHETERIZATION N/A 03/09/2016   Procedure: Intravascular Pressure Wire/FFR Study;  Surgeon: Wellington Hampshire, MD;  Location: [place] [facility];  Service: Cardiovascular;  Laterality: N/A;  mid LAD   CARDIAC CATHETERIZATION N/A 03/09/2016   Procedure: Left Heart Cath and Coronary Angiography;  Surgeon: Wellington Hampshire, MD;  Location: [place] [facility];  Service: Cardiovascular;  Laterality: N/A;   EMBOLECTOMY Right 02/21/2016   Procedure: EMBOLECTOMY RIGHT COMMON FEMORAL ARTERY; FOUR COMPARTMENT FASCIOTOMY;  Surgeon: Conrad Ravia, MD;  Location: [place];  Service: Vascular;  Laterality: Right;   FASCIOTOMY CLOSURE Right 02/26/2016   Procedure: RIGHT LATERAL LOWER LEG FASCIOTOMY CLOSURE;  Surgeon: Rosetta Posner, MD;  Location: [place];  Service: Vascular;  Laterality: Right;   HEMATOMA EVACUATION Right 02/22/2016   Procedure: EVACUATION HEMATOMA With Placement of Negative Pressure Dressing;  Surgeon: Conrad Pra[place] Ridge, MD;  Location[place];  Service: Vascular;  Laterality: Right;   Allergies  Allergen Reactions   Metformin     Other reaction(s): Dizziness (intolerance)   Prior to Admission medications   Medication Sig Start Date End Date Taking? Authorizing Provider  aspirin EC 81 MG tablet Take 81 mg by mouth daily. Swallow whole.   Yes [provider]  atorvastatin (LIPITOR) 20 MG tablet Take 1 tablet (20 mg total) by mouth daily. 07/25/22  Yes Wendie Agreste, MD  Dulaglutide (TRULICITY) 3 XT/0.6YI SOPN Inject 3 mg as directed once a week. 07/25/22  Yes Wendie Agreste, MD  fluticasone (FLONASE) 50 MCG/ACT nasal spray Place 1 spray into both nostrils  daily. 07/25/22  Yes Wendie Agreste, MD  glucose blood (TRUE METRIX BLOOD GLUCOSE TEST) test strip Use 1 strip to check blood sugar 2 times daily 01/11/22  Yes Wendie Agreste, MD  latanoprost (XALATAN) 0.005 % ophthalmic solution Place 1 drop into both eyes every evening as directed. 06/30/22  Yes   Latanoprostene Bunod (VYZULTA) 0.024 % SOLN Place 1 drop into both eyes every evening as directed 05/26/22  Yes   diclofenac Sodium (VOLTAREN) 1 % GEL Apply 2 g topically 4 (four) times daily. Patient not taking: Reported on 02/16/2022 04/24/20   Augusto Gamble B, NP  triamcinolone cream (KENALOG) 0.1 % Apply 1 Application topically 2 (two) times daily as needed. If needed to itching areas. Patient not taking: Reported on 07/25/2022 04/28/22   Wendie Agreste, MD   Social History   Socioeconomic History   Marital status: Married    Spouse name: [name]   Number of children: 5   Years  of education: COLLEGE   Highest education level: Not on file  Occupational History   Occupation: UNEMPLOYED  Tobacco Use   Smoking status: Never   Smokeless tobacco: Never  Vaping Use   Vaping Use: Never used  Substance and Sexual Activity   Alcohol use: No   Drug use: No   Sexual activity: Not on file  Other Topics Concern   Not on file  Social History Narrative   Not on file   Social Determinants of Health   Financial Resource Strain: Not on file  Food Insecurity: Not on file  Transportation Needs: Not on file  Physical Activity: Not on file  Stress: Not on file  Social Connections: Not on file  Intimate Partner Violence: Not on file    Review of Systems  Constitutional:  Negative for fatigue and unexpected weight change.  Eyes:  Negative for visual disturbance.  Respiratory:  Negative for cough, chest tightness and shortness of breath.   Cardiovascular:  Negative for chest pain, palpitations and leg swelling.  Gastrointestinal:  Negative for abdominal pain and blood in stool.  Neurological:   Negative for dizziness, light-headedness and headaches.     Objective:   Vitals:   08/31/22 1422  BP: 138/70  Pulse: 94  Temp: 98.6 F (37 C)  TempSrc: Temporal  SpO2: 97%  Weight: 212 lb 6.4 oz (96.3 kg)  Height: 5\' 8"  (1.727 m)     Physical Exam Vitals reviewed.  Constitutional:      Appearance: He is well-developed.  HENT:     Head: Normocephalic and atraumatic.  Neck:     Vascular: No carotid bruit or JVD.  Cardiovascular:     Rate and Rhythm: Normal rate and regular rhythm.     Heart sounds: Normal heart sounds. No murmur heard. Pulmonary:     Effort: Pulmonary effort is normal.     Breath sounds: Normal breath sounds. No rales.  Musculoskeletal:     Right lower leg: No edema.     Left lower leg: No edema.     Comments: Right knee, skin intact, no erythema, no effusion.  No focal bony tenderness.  Pain-free range of motion, negative drawer, negative McMurray, negative varus, valgus stress.  Locates medial joint line as area of previous discomfort.  Nontender on exam.  Skin:    General: Skin is warm and dry.  Neurological:     Mental Status: He is alert and oriented to person, place, and time.  Psychiatric:        Mood and Affect: Mood normal.     Assessment & Plan:  Troy Yu is a 62 y.o. male . Right knee pain, unspecified chronicity - Plan: DG Knee Complete 4 Views Right  -Intermittent symptoms.  Suspect medial compartment degenerative joint disease based on location.  Reassuring exam at present.  Check updated imaging with option of Voltaren gel, and Ortho referral if persistent.  He will let me know.  Type 2 diabetes mellitus with hyperglycemia, without long-term current use of insulin (HCC) - Plan: Semaglutide, 1 MG/DOSE, 4 MG/3ML SOPN Intolerant to Trulicity as above.  Would like to try Ozempic.  Differential includes diabetic gastroparesis but symptoms attributed to start of Trulicity.  Will change back to his Ozempic but if incomplete  emptying feeling persists or worsening, RTC precautions/ER precautions given.  Meds ordered this encounter  Medications   Semaglutide, 1 MG/DOSE, 4 MG/3ML SOPN    Sig: Inject 1 mg as directed once a week.  Dispense:  3 mL    Refill:  3   Patient Instructions  I prescribed Ozempic, same dose you were on previously.  Stop Trulicity.  If the upset stomach, or feeling of food not digesting does not improve with switch to Ozempic, return for recheck as you may need to see a stomach specialist. Return to the clinic or go to the nearest emergency room if any of your symptoms worsen or new symptoms occur.   Over the counter Voltaren gel if needed for knee pain. You may have some arthritis.  Xray of knee at location below.  [facility]  Walk in 8:30-4:30 during weekdays, no appointment needed [address]  [place], [place] [address] If continued knee pain let me know and I can send in referral to an orthopaedic doctor.   Take care.         Signed,   Meredith Staggers, MD [facility], [facility] 08/31/22 3:38 PM

## 2022-09-01 LAB — COMPREHENSIVE METABOLIC PANEL
ALT: 38 U/L (ref 0–53)
AST: 24 U/L (ref 0–37)
Albumin: 3.9 g/dL (ref 3.5–5.2)
Alkaline Phosphatase: 68 U/L (ref 39–117)
BUN: 19 mg/dL (ref 6–23)
CO2: 27 mEq/L (ref 19–32)
Calcium: 9.2 mg/dL (ref 8.4–10.5)
Chloride: 102 mEq/L (ref 96–112)
Creatinine, Ser: 1.1 mg/dL (ref 0.40–1.50)
GFR: 71.62 mL/min (ref >60.00)
Glucose, Bld: 252 mg/dL — ABNORMAL HIGH (ref 70–99)
Potassium: 4.1 mEq/L (ref 3.5–5.1)
Sodium: 136 mEq/L (ref 135–145)
Total Bilirubin: 0.4 mg/dL (ref 0.2–1.2)
Total Protein: 7.3 g/dL (ref 6.0–8.3)

## 2022-09-01 LAB — LIPID PANEL
Cholesterol: 147 mg/dL (ref 0–200)
HDL: 45 mg/dL (ref >39.00)
LDL Cholesterol: 78 mg/dL (ref 0–99)
NonHDL: 101.98
Total CHOL/HDL Ratio: 3
Triglycerides: 122 mg/dL (ref 0.0–149.0)
VLDL: 24.4 mg/dL (ref 0.0–40.0)

## 2022-09-01 LAB — HEMOGLOBIN A1C: Hgb A1c MFr Bld: 8.5 % — ABNORMAL HIGH (ref 4.6–6.5)

## 2022-09-09 ENCOUNTER — Other Ambulatory Visit (HOSPITAL_COMMUNITY): Payer: Self-pay

## 2022-09-09 NOTE — Telephone Encounter (Signed)
Following up on appeal for Ozempic and the patient is no longer with [employer] but with [employer].     Received notification that prior authorization for Ozempic is required/requested.  Per Test Claim: Step Therapy: PA/Override required.    PA submitted on 09/09/22 to (ins) [employer] via telephone. PA# [id number]  Status is pending  Representative placed a 30 day override for the patient to get medication while waiting on determination.

## 2022-09-13 NOTE — Telephone Encounter (Signed)
Patient Advocate Encounter  Prior Authorization for Ozempic (1MG /DOSE) has been approved.    Effective dates: 09/09/22 through 09/10/23  Approval letter attached to charts

## 2022-09-16 ENCOUNTER — Other Ambulatory Visit (HOSPITAL_COMMUNITY): Payer: Self-pay

## 2022-09-28 ENCOUNTER — Other Ambulatory Visit (HOSPITAL_COMMUNITY): Payer: Self-pay

## 2022-09-29 NOTE — Progress Notes (Signed)
This encounter was created in error - please disregard.

## 2022-10-03 ENCOUNTER — Ambulatory Visit: Payer: Medicaid Other | Admitting: Family Medicine

## 2022-10-06 ENCOUNTER — Other Ambulatory Visit (HOSPITAL_COMMUNITY): Payer: Self-pay

## 2022-10-06 ENCOUNTER — Ambulatory Visit (INDEPENDENT_AMBULATORY_CARE_PROVIDER_SITE_OTHER): Payer: PRIVATE HEALTH INSURANCE | Admitting: Family Medicine

## 2022-10-06 VITALS — BP 126/74 | HR 86 | Temp 97.8°F | Ht 68.0 in | Wt 213.8 lb

## 2022-10-06 DIAGNOSIS — E785 Hyperlipidemia, unspecified: Secondary | ICD-10-CM | POA: Diagnosis not present

## 2022-10-06 DIAGNOSIS — E1165 Type 2 diabetes mellitus with hyperglycemia: Secondary | ICD-10-CM

## 2022-10-06 LAB — GLUCOSE, POCT (MANUAL RESULT ENTRY): POC Glucose: 214 mg/dl — AB (ref 70–99)

## 2022-10-06 MED ORDER — SEMAGLUTIDE (2 MG/DOSE) 8 MG/3ML ~~LOC~~ SOPN
2.0000 mg | PEN_INJECTOR | SUBCUTANEOUS | 1 refills | Status: DC
  Filled 2022-10-06 – 2022-10-17 (×2): qty 3, 28d supply, fill #0
  Filled 2022-10-31 – 2022-11-09 (×2): qty 3, 28d supply, fill #1

## 2022-10-06 MED ORDER — ATORVASTATIN CALCIUM 20 MG PO TABS
20.0000 mg | ORAL_TABLET | Freq: Every day | ORAL | 1 refills | Status: DC
  Filled 2022-10-06 – 2022-11-09 (×2): qty 90, 90d supply, fill #0

## 2022-10-06 NOTE — Progress Notes (Signed)
Subjective:  Patient ID: Troy Yu, male    DOB: Feb 02, 1961  Age: 62 y.o. MRN: [id number]  CC:  Chief Complaint  Patient presents with   Diabetes    Check on ozempic pt reports has gained weight, BG 214 in office has not eaten since about 9:30-10 am     HPI Troy Yu presents for   Diabetes: With hyperglycemia, microalbuminuria, ischemic cardiomyopathy followed by cardiology.  Previously had been on Ozempic, but not covered by insurance, went through multiple prior authorization and appeals, tried [employer] after discussion in November.  Has been on 3 mg dose since his November 27 visit, still has some elevated readings at his discussion on 08/31/2022.  New insurance this year, we changed back to Ozempic, 1 mg/week.  Trulicity discontinued. Has taken 2 -3 doses. Feels better with ozempic.  Fasting: infrequent readings, but fasting 209 this morning - late meal last night.  No n/v/abd pain.  He is on statin with Lipitor 20 mg daily. Microalbumin: 10/20/21.  Optho, foot exam, pneumovax:   Lab Results  Component Value Date   HGBA1C 8.5 (H) 08/31/2022   HGBA1C 7.1 (A) 04/28/2022   HGBA1C 7.4 (H) 01/06/2022   Lab Results  Component Value Date   MICROALBUR 4.8 (H) 10/20/2021   LDLCALC 78 08/31/2022   CREATININE 1.10 08/31/2022   HM: Cologuard 07/14/22 HIV/HEP C screening - declined.  Shingrix - declined.   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, without long-term current use of insulin (HCC) 11/16/2016    Chronic systolic congestive heart failure ([place]) 11/16/2016   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 ([place]) 11/16/2016   Noncompliance 11/16/2016   Old MI (myocardial infarction) 11/16/2016   Pure hypercholesterolemia 11/16/2016   History of fasciotomy 06/07/2016   Status post coronary artery stent placement    Acute combined systolic and diastolic heart failure (HCC)    Abnormal CXR    Abdominal pain    Chest pain    NSTEMI (non-ST elevated myocardial infarction) ([place])    Fever    Thromboembolism (HCC)    Vascular occlusion    Pain    Anterior subendocardial MI ([place]) 02/21/2016   Right leg pain 02/21/2016   Non-STEMI (non-ST elevated myocardial infarction) ([place]) 02/21/2016   LV (left ventricular) mural thrombus 02/21/2016   Elevated troponin 02/21/2016   Abnormal EKG 02/21/2016   Acute MI ([place]) 02/21/2016   History of embolectomy 02/21/2016   Past Medical History:  Diagnosis Date   Diabetes ([place])    Hypercholesteremia    LV (left ventricular) mural thrombus    [facility]STEMI (non-ST elevated myocardial infarction) [facility])    Past Surgical History:  Procedure Laterality Date   APPLICATION OF WOUND VAC Right 02/22/2016   Procedure: APPLICATION OF Negative Pressure Dressing Right Lower Leg;  Surgeon: Conrad Middlebrook, MD;  Location: [place];  Service: Vascular;  Laterality: Right;   APPLICATION OF WOUND VAC Right 02/26/2016   Procedure: APPLICATION OF WOUND VAC, RIGHT MEDIAL LOWER LEG FASCIOTOMY SITE;  Surgeon: Rosetta Posner, MD;  [facility];  Service: Vascular;  Laterality: Right;   CARDIAC CATHETERIZATION N/A 03/09/2016   Procedure: Coronary Stent Intervention;  Surgeon: Wellington Hampshire, MD;  Location: [place] [facility];  Service: Cardiovascular;  Laterality: N/A;  distal RCA mid LAD   CARDIAC CATHETERIZATION N/A 03/09/2016   Procedure: Intravascular Pressure Wire/FFR Study;  Surgeon: Wellington Hampshire, MD;  Location: [place] [facility];  Service: Cardiovascular;  Laterality: N/A;   mid LAD   CARDIAC CATHETERIZATION N/A 03/09/2016   Procedure: Left Heart Cath and Coronary Angiography;  Surgeon: Wellington Hampshire, MD;  Location: [place] [facility];  Service: Cardiovascular;  Laterality: N/A;   EMBOLECTOMY Right 02/21/2016   Procedure: EMBOLECTOMY RIGHT COMMON FEMORAL ARTERY; FOUR COMPARTMENT FASCIOTOMY;  Surgeon: Conrad Oberlin, MD;  Location: [place];  Service: Vascular;  Laterality: Right;   FASCIOTOMY CLOSURE Right 02/26/2016   Procedure: RIGHT LATERAL LOWER LEG FASCIOTOMY CLOSURE;  Surgeon: Rosetta Posner, MD;  Location: [place];  Service: Vascular;  Laterality: Right;   HEMATOMA EVACUATION Right 02/22/2016   Procedure: EVACUATION HEMATOMA With Placement of Negative Pressure Dressing;  Surgeon: Conrad Lena, MD;  Location: [place];  Service: Vascular;  Laterality: Right;   Allergies  Allergen Reactions   Metformin     Other reaction(s): Dizziness (intolerance)   Prior to Admission medications   Medication Sig Start Date End Date Taking? Authorizing Provider  aspirin EC 81 MG tablet Take 81 mg by mouth daily. Swallow whole.   Yes [provider]  atorvastatin (LIPITOR) 20 MG tablet Take 1 tablet (20 mg total) by mouth daily. 07/25/22  Yes Wendie Agreste, MD  fluticasone (FLONASE) 50 MCG/ACT nasal spray Place 1 spray into both nostrils daily. 07/25/22  Yes Wendie Agreste, MD  glucose blood (TRUE METRIX BLOOD GLUCOSE TEST) test strip Use 1 strip to check blood sugar 2 times daily 01/11/22  Yes Wendie Agreste, MD  latanoprost (XALATAN) 0.005 % ophthalmic solution Place 1 drop into both eyes every evening as directed. 06/30/22  Yes   Latanoprostene Bunod (VYZULTA) 0.024 % SOLN Place 1 drop into both eyes every evening as directed 05/26/22  Yes   Semaglutide, 1 MG/DOSE, 4 MG/3ML SOPN Inject 1 mg as directed once a week. 08/31/22  Yes Wendie Agreste, MD   Social History   Socioeconomic History   Marital status: Married    Spouse name: [name]   Number of children: 5    Years of education: COLLEGE   Highest education level: Not on file  Occupational History   Occupation: UNEMPLOYED  Tobacco Use   Smoking status: Never   Smokeless tobacco: Never  Vaping Use   Vaping Use: Never used  Substance and Sexual Activity   Alcohol use: No   Drug use: No   Sexual activity: Not on file  Other Topics Concern   Not on file  Social History Narrative   Not on file   Social Determinants of Health   Financial Resource Strain: Not on file  Food Insecurity: Not on file  Transportation Needs: Not on file  Physical Activity: Not on file  Stress: Not on file  Social Connections: Not on file  Intimate Partner Violence: Not on file    Review of Systems   Objective:   Vitals:   10/06/22 1411  BP: 126/74  Pulse: 86  Temp: 97.8 F (36.6 C)  TempSrc: Temporal  SpO2: 98%  Weight: 213 lb 12.8 oz (97 kg)  Height: 5' 8"$  (  1.727 m)     Physical Exam Vitals reviewed.  Constitutional:      Appearance: He is well-developed.  HENT:     Head: Normocephalic and atraumatic.  Neck:     Vascular: No carotid bruit or JVD.  Cardiovascular:     Rate and Rhythm: Normal rate and regular rhythm.     Heart sounds: Normal heart sounds. No murmur heard. Pulmonary:     Effort: Pulmonary effort is normal.     Breath sounds: Normal breath sounds. No rales.  Musculoskeletal:     Right lower leg: No edema.     Left lower leg: No edema.  Skin:    General: Skin is warm and dry.  Neurological:     Mental Status: He is alert and oriented to person, place, and time.  Psychiatric:        Mood and Affect: Mood normal.     Results for orders placed or performed in visit on 10/06/22  POCT glucose (manual entry)  Result Value Ref Range   POC Glucose 214 (A) 70 - 99 mg/dl    Assessment & Plan:  [place] Yu is a 62 y.o. male . Type 2 diabetes mellitus with hyperglycemia, without long-term current use of insulin (HCC) - Plan: POCT glucose (manual entry),  Semaglutide, 2 MG/DOSE, 8 MG/3ML SOPN  Hyperlipidemia, unspecified hyperlipidemia type - Plan: atorvastatin (LIPITOR) 20 MG tablet  Elevated readings on 1 mg Ozempic, okay to try left additional week of 1 mg but then if still having persistent elevated readings start 2 mg dose.  Potential side effects at the higher dose with RTC precautions given.  Tolerating current dose of Lipitor, continue same.  Follow-up with home readings, fasting, postprandial in 1 month.  Meds ordered this encounter  Medications   atorvastatin (LIPITOR) 20 MG tablet    Sig: Take 1 tablet (20 mg total) by mouth daily.    Dispense:  90 tablet    Refill:  1   Semaglutide, 2 MG/DOSE, 8 MG/3ML SOPN    Sig: Inject 2 mg as directed once a week.    Dispense:  3 mL    Refill:  1   Patient Instructions  Okay to use the last dose of Ozempic 1 mg but then change to the 2 mg dose.  Follow-up with me in 1 month with a list of your home blood sugar readings.  Please bring some readings that are fasting and 2 hours after meals to help decide on other changes.  If you have any nausea, vomiting, abdominal pain or new side effects at the 2 mg dose, return to the 1 mg dose and let me know right away.  Thank you for coming in today and take care.     Signed,   Merri Ray, MD [facility] 10/06/22 2:54 PM

## 2022-10-06 NOTE — Patient Instructions (Signed)
Okay to use the last dose of Ozempic 1 mg but then change to the 2 mg dose.  Follow-up with me in 1 month with a list of your home blood sugar readings.  Please bring some readings that are fasting and 2 hours after meals to help decide on other changes.  If you have any nausea, vomiting, abdominal pain or new side effects at the 2 mg dose, return to the 1 mg dose and let me know right away.  Thank you for coming in today and take care.

## 2022-10-07 ENCOUNTER — Encounter: Payer: Self-pay | Admitting: Family Medicine

## 2022-10-17 ENCOUNTER — Other Ambulatory Visit (HOSPITAL_COMMUNITY): Payer: Self-pay

## 2022-10-18 LAB — HM DIABETES EYE EXAM

## 2022-10-21 ENCOUNTER — Encounter: Payer: Self-pay | Admitting: Family Medicine

## 2022-10-25 ENCOUNTER — Other Ambulatory Visit (HOSPITAL_COMMUNITY): Payer: Self-pay

## 2022-10-25 MED ORDER — TIMOLOL MALEATE 0.5 % OP SOLN
1.0000 [drp] | OPHTHALMIC | 3 refills | Status: DC
  Filled 2022-10-25: qty 5, 25d supply, fill #0
  Filled 2022-11-09 – 2022-12-13 (×2): qty 5, 50d supply, fill #1

## 2022-10-31 ENCOUNTER — Other Ambulatory Visit (HOSPITAL_COMMUNITY): Payer: Self-pay

## 2022-11-04 ENCOUNTER — Encounter: Payer: Self-pay | Admitting: Cardiovascular Disease

## 2022-11-07 ENCOUNTER — Ambulatory Visit: Payer: PRIVATE HEALTH INSURANCE | Admitting: Family Medicine

## 2022-11-07 NOTE — Telephone Encounter (Signed)
Called patient and scheduled him in July.

## 2022-11-07 NOTE — Progress Notes (Deleted)
   Subjective:  Patient ID: Troy Yu, male    DOB: [birth year]  Age: 62 y.o. MRN: [id number]  CC: No chief complaint on file.   HPI Troy Yu presents for   Diabetes: With hyperglycemia, microalbuminuria, ischemic cardiomyopathy followed by cardiology.  Medications were changed due to coverage and insurance changes.  Ozempic was restarted at 1 mg/week, only took a few doses as of his February visit.  A1c understandably elevated from January given med changes and availability.  He is on statin with Lipitor 20 mg daily.  Ozempic increased from 1 to 2 mg injection at his February visit. Home readings fasting Postprandial Symptomatic lows  Microalbumin: Due, 4.8 on 10/20/2021 Optho, foot exam, pneumovax: Foot exam 11/04/2021. HIV, Hep C screening: Cologuard negative 07/14/22.   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 associated with type 2 diabetes mellitus ([place]) 01/23/2018   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, without long-term current use of insulin (HCC) 31/51/7616   Chronic systolic congestive heart failure ([place]) 11/16/2016   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 ([place]) 11/16/2016   Noncompliance 11/16/2016   Old MI (myocardial infarction) 11/16/2016   Pure hypercholesterolemia 11/16/2016   History of fasciotomy 06/07/2016   Status post coronary  artery stent placement    Acute combined systolic and diastolic heart failure (HCC)    Abnormal CXR    Abdominal pain    Chest pain    NSTEMI (non-ST elevated myocardial infarction) ([place])    Fever    Thromboembolism (HCC)    Vascular occlusion    Pain    Anterior subendocardial MI ([place]) 02/21/2016   Right leg pain 02/21/2016   Non-STEMI (non-ST elevated myocardial infarction) ([place]) 02/21/2016   LV (left ventricular) mural thrombus 02/21/2016   Elevated troponin 02/21/2016   Abnormal EKG 02/21/2016   Acute MI ([place]) 02/21/2016   History of embolectomy 02/21/2016    Past Medical History:  Diagnosis Date   Diabetes ([place])    Hypercholesteremia    LV (left ventricular) mural thrombus    NSTEMI (non-ST elevated myocardial infarction) (HCC)     {History (Optional):23778}  Review of Systems   Objective:  There were no vitals filed for this visit. {Vitals History (Optional):23777}  Physical Exam     Assessment & Plan:  Troy Yu is a 62 y.o. male . There are no diagnoses linked to this encounter.  There are no Patient Instructions on file for this visit.    Signed,   Merri Ray, MD [place] 11/07/22 2:54 PM

## 2022-11-09 ENCOUNTER — Other Ambulatory Visit: Payer: Self-pay | Admitting: Family Medicine

## 2022-11-09 ENCOUNTER — Other Ambulatory Visit (HOSPITAL_COMMUNITY): Payer: Self-pay

## 2022-11-09 ENCOUNTER — Other Ambulatory Visit: Payer: Self-pay

## 2022-11-09 MED ORDER — ASPIRIN 81 MG PO TBEC
81.0000 mg | DELAYED_RELEASE_TABLET | Freq: Every day | ORAL | 3 refills | Status: AC
  Filled 2022-11-09: qty 30, 30d supply, fill #0

## 2022-11-09 NOTE — Telephone Encounter (Signed)
Note reviewed from vascular in June 2023.  No specific vascular indication for anticoagulation but was taking daily aspirin at that time which vascular surgeon felt was adequate.  Will continue aspirin 81 mg daily for now.

## 2022-11-09 NOTE — Telephone Encounter (Signed)
Does not appear we have written this as a prescription in the past please advise

## 2022-11-10 ENCOUNTER — Other Ambulatory Visit: Payer: Self-pay

## 2022-11-10 ENCOUNTER — Encounter: Payer: Self-pay | Admitting: Family Medicine

## 2022-11-10 ENCOUNTER — Ambulatory Visit (INDEPENDENT_AMBULATORY_CARE_PROVIDER_SITE_OTHER): Payer: PRIVATE HEALTH INSURANCE | Admitting: Family Medicine

## 2022-11-10 ENCOUNTER — Other Ambulatory Visit (HOSPITAL_COMMUNITY): Payer: Self-pay

## 2022-11-10 VITALS — BP 120/72 | HR 84 | Temp 97.8°F | Ht 68.0 in | Wt 206.2 lb

## 2022-11-10 DIAGNOSIS — E1165 Type 2 diabetes mellitus with hyperglycemia: Secondary | ICD-10-CM

## 2022-11-10 DIAGNOSIS — L819 Disorder of pigmentation, unspecified: Secondary | ICD-10-CM | POA: Diagnosis not present

## 2022-11-10 DIAGNOSIS — R200 Anesthesia of skin: Secondary | ICD-10-CM

## 2022-11-10 LAB — GLUCOSE, POCT (MANUAL RESULT ENTRY): POC Glucose: 159 mg/dl — AB (ref 70–99)

## 2022-11-10 MED ORDER — SEMAGLUTIDE (2 MG/DOSE) 8 MG/3ML ~~LOC~~ SOPN
2.0000 mg | PEN_INJECTOR | SUBCUTANEOUS | 3 refills | Status: DC
  Filled 2022-11-11: qty 3, 28d supply, fill #0
  Filled 2022-12-13 (×2): qty 3, 28d supply, fill #1
  Filled 2023-01-10: qty 3, 28d supply, fill #2
  Filled 2023-02-08: qty 3, 28d supply, fill #3

## 2022-11-10 NOTE — Progress Notes (Signed)
Subjective:  Patient ID: Troy Yu, male    DOB: [birth year]  Age: 62 y.o. MRN: [id number]  CC:  Chief Complaint  Patient presents with   Diabetes    Pt did not bring readings, currently doing a fasting period, fasting BG 159   Skin Discoloration    Pt notes some days he will have a dark spot on the skin of his Rt leg and notes in 2017 he had a clot that flowed from the heart to that Rt leg, wanted you to assess if this was something to be concerned about, notes no swelling with discoloration and it does go away fairly quickly     HPI Troy Yu presents for   Right leg skin discoloration Right leg.  Intermittent symptoms.  Darkening of skin lower legs at times, more dark at times. Sore with prayer yesterday, sometimes feels numb. Not persistent.  No toe color changes.   Visit with vascular surgeon Dr. Donnetta Hutching in June 2023. History of critical limb ischemia of the right leg, 2017.  MI with cardiac source for embolus to the right leg.  Underwent emergent embolectomy and 4 compartment fasciotomy. Eventually had complete healing.   No indication from a vascular standpoint for anticoagulation at that time and was continued on daily aspirin.  Follow-up as needed.    Diabetes: With hyperglycemia, microalbuminuria, ischemic cardiomyopathy followed by cardiology.  Previously on Ozempic, not covered, transferred to [place] due to insurance coverage temporarily last year, then back to [place] this year.  Initially 1 mg/week.  Few doses at his last visit in February, still elevated readings, discussed increasing to 2 mg dose per week.  He was continued on statin with Lipitor 20 mg daily.  Home readings and weight improving.  No n/v/abd pain.  Glucose 139 fasting.  No sx lows. No recent 200's on '2mg'$  dose.  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[place]) 04/17/2018   Tooth decay 03/02/2018   Cortical age-related cataract of left eye 01/30/2018   Nuclear sclerotic cataract of both eyes 01/30/2018   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 associated with type 2 diabetes mellitus ([place]) 01/23/2018   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, without long-term current use of insulin (HCC) 0000000   Chronic systolic congestive heart failure ([place]) 11/16/2016   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 ([place]) 11/16/2016   Noncompliance 11/16/2016   Old MI (myocardial infarction) 11/16/2016   Pure hypercholesterolemia 11/16/2016   History of fasciotomy 06/07/2016   Status post coronary artery stent placement    Acute combined systolic and diastolic heart failure (HCC)    Abnormal CXR    Abdominal pain    Chest pain    NSTEMI (non-ST elevated myocardial infarction) ([place])    Fever    Thromboembolism (HCC)    Vascular occlusion    Pain  Anterior subendocardial MI ([place]) 02/21/2016   Right leg pain 02/21/2016   Non-STEMI (non-ST elevated myocardial infarction) ([place]) 02/21/2016   LV (left ventricular) mural thrombus 02/21/2016   Elevated troponin 02/21/2016   Abnormal EKG 02/21/2016   Acute MI ([place]) 02/21/2016   History of embolectomy 02/21/2016   Past Medical History:  Diagnosis Date   Diabetes ([place])    Hypercholesteremia    LV (left ventricular) mural thrombus    NSTEMI (non-ST elevated myocardial infarction) [facility])    Past Surgical History:  Procedure Laterality Date   APPLICATION OF WOUND VAC  Right 02/22/2016   Procedure: APPLICATION OF Negative Pressure Dressing Right Lower Leg;  Surgeon: Conrad [place], MD;  Location: [facility];  Service: Vascular;  Laterality: Right;   APPLICATION OF WOUND VAC Right 02/26/2016   Procedure: APPLICATION OF WOUND VAC, RIGHT MEDIAL LOWER LEG FASCIOTOMY SITE;  Surgeon: Rosetta Posner, MD;  Location: [facility];  Service: Vascular;  Laterality: Right;   CARDIAC CATHETERIZATION N/A 03/09/2016   Procedure: Coronary Stent Intervention;  Surgeon: Wellington Hampshire, MD;  Location: [facility];  Service: Cardiovascular;  Laterality: N/A;  distal RCA mid LAD   CARDIAC CATHETERIZATION N/A 03/09/2016   Procedure: Intravascular Pressure Wire/FFR Study;  Surgeon: Wellington Hampshire, MD;  Location: [facility];  Service: Cardiovascular;  Laterality: N/A;  mid LAD   CARDIAC CATHETERIZATION N/A 03/09/2016   Procedure: Left Heart Cath and Coronary Angiography;  Surgeon: Wellington Hampshire, MD;  Location: [facility];  Service: Cardiovascular;  Laterality: N/A;   EMBOLECTOMY Right 02/21/2016   Procedure: EMBOLECTOMY RIGHT COMMON FEMORAL ARTERY; FOUR COMPARTMENT FASCIOTOMY;  Surgeon: Conrad Bonham, MD;  Location: [facility];  Service: Vascular;  Laterality: Right;   FASCIOTOMY CLOSURE Right 02/26/2016   Procedure: RIGHT LATERAL LOWER LEG FASCIOTOMY CLOSURE;  Surgeon: Rosetta Posner, MD;  Location: [facility];  Service: Vascular;  Laterality: Right;   HEMATOMA EVACUATION Right 02/22/2016   Procedure: EVACUATION HEMATOMA With Placement of Negative Pressure Dressing;  Surgeon: Conrad Folsom, MD;  Location: [facility];  Service: Vascular;  Laterality: Right;   Allergies  Allergen Reactions   Metformin     Other reaction(s): Dizziness (intolerance)   Prior to Admission medications   Medication Sig Start Date End Date Taking? Authorizing Provider  aspirin EC 81 MG tablet Take 1 tablet (81 mg total) by mouth daily.  Swallow whole. 11/09/22  Yes Wendie Agreste, MD  atorvastatin (LIPITOR) 20 MG  tablet Take 1 tablet (20 mg total) by mouth daily. 10/06/22  Yes Wendie Agreste, MD  fluticasone (FLONASE) 50 MCG/ACT nasal spray Place 1 spray into both nostrils daily. 07/25/22  Yes Wendie Agreste, MD  glucose blood (TRUE METRIX BLOOD GLUCOSE TEST) test strip Use 1 strip to check blood sugar 2 times daily 01/11/22  Yes Wendie Agreste, MD  latanoprost (XALATAN) 0.005 % ophthalmic solution Place 1 drop into both eyes every evening as directed. 06/30/22  Yes   Latanoprostene B[place]od (VYZULTA) 0.024 % SOLN Place 1 drop into both eyes every evening as directed 05/26/22  Yes   Semaglutide, 2 MG/DOSE, 8 MG/3ML SOPN Inject 2 mg as directed once a week. 10/06/22  Yes Wendie Agreste, MD  timolol (TIMOPTIC) 0.5 % ophthalmic solution Place 1 drop into both eyes every morning as directed 10/25/22  Yes    Social History   Socioeconomic History   Marital status: Married    Spouse name: Leisure centre manager  Number of children: 5   Years of education: COLLEGE   Highest education level: Not on file  Occupational History   Occupation: UNEMPLOYED  Tobacco Use   Smoking status: Never   Smokeless tobacco: Never  Vaping Use   Vaping Use: Never used  Substance and Sexual Activity   Alcohol use: No   Drug use: No   Sexual activity: Not on file  Other Topics Concern   Not on file  Social History Narrative   Not on file   Social Determinants of Health   Financial Resource Strain: Not on file  Food Insecurity: Not on file  Transportation Needs: Not on file  Physical Activity: Not on file  Stress: Not on file  Social Connections: Not on file  Intimate Partner Violence: Not on file    Review of Systems   Objective:   Vitals:   11/10/22 1603  BP: 120/72  Pulse: 84  Temp: 97.8 F (36.6 C)  TempSrc: Temporal  SpO2: 98%  Weight: 206 lb 3.2 oz (93.5 kg)  Height: '5\' 8"'$  (1.727 m)     Physical Exam Vitals reviewed.  Constitutional:      Appearance: He is well-developed.  HENT:     Head:  Normocephalic and atraumatic.  Neck:     Vascular: No carotid bruit or JVD.  Cardiovascular:     Rate and Rhythm: Normal rate and regular rhythm.     Heart sounds: Normal heart sounds. No murmur heard. Pulmonary:     Effort: Pulmonary effort is normal.     Breath sounds: Normal breath sounds. No rales.  Musculoskeletal:     Right lower leg: No edema.     Left lower leg: No edema.  Skin:    General: Skin is warm and dry.     Comments: Areas of hyperpigmentation right lower extremity with some scars, well-healed.  Stasis changes.  Minimal pedal edema on right.  Toes warm with cap refill less than 1 second and DP pulse palpable.  Neurological:     Mental Status: He is alert and oriented to person, place, and time.  Psychiatric:        Mood and Affect: Mood normal.         Results for orders placed or performed in visit on 11/10/22  POCT glucose (manual entry)  Result Value Ref Range   POC Glucose 159 (A) 70 - 99 mg/dl     Assessment & Plan:  Troy Yu is a 62 y.o. male . Type 2 diabetes mellitus with hyperglycemia, without long-term current use of insulin (HCC) - Plan: POCT glucose (manual entry), Semaglutide, 2 MG/DOSE, 8 MG/3ML SOPN, Comprehensive metabolic panel, Hemoglobin A1c, Microalbumin / creatinine urine ratio  -Improved weight and glycemic control on higher dose of Ozempic.  Tolerating current dose, continue same with lab only visit in 1 month.  Discoloration of skin of lower leg  Appears to be stasis changes, previous surgery as above.  No new rash or wounds appreciated.  DP pulse and cap refill at toes intact with warm toes.  Does not appear to be vascular.  Option to meet with dermatology or follow-up with vascular if worsening or new rash.  Reassurance provided at present.  Numbness in feet Intermittent symptoms.  Could be related to previous leg surgery, versus neuropathy with diabetes.  However unilateral.  With intermittent, mild symptoms at present,  new meds deferred.  Consider gabapentin if worsening or more persistent.  Meds ordered this encounter  Medications  Semaglutide, 2 MG/DOSE, 8 MG/3ML SOPN    Sig: Inject 2 mg as directed once a week.    Dispense:  3 mL    Refill:  3   Patient Instructions  Lab visit in 1 month:  [facility] or xray: Walk in 8:30-4:30 during weekdays, no appointment needed [address].  [place], [place] [id number]  Skin discoloration could be from swelling in that leg or previous procedure.  Toes are warm today and circulation appears to be working well.  I do not see any rash or wound, but if you notice any persistent or worsening skin changes I would recommend evaluation with dermatology.  Please let me know.  If any persistent pain or burning of feet, that could be related to your previously procedure or with diabetes but we do have medication options to treat if that is persistent or more bothersome.  Let me know.  Lab visit in 1 month, visit with me in 4 months.  Take care.     Signed,   Merri Ray, MD [facility] 11/10/22 5:40 PM

## 2022-11-10 NOTE — Patient Instructions (Signed)
Lab visit in 1 month:  [facility] or [facility]: Walk in 8:30-4:30 during weekdays, no appointment needed [address].  [place], [place] [address]  Skin discoloration could be from swelling in that leg or previous procedure.  Toes are warm today and circulation appears to be working well.  I do not see any rash or wound, but if you notice any persistent or worsening skin changes I would recommend evaluation with dermatology.  Please let me know.  If any persistent pain or burning of feet, that could be related to your previously procedure or with diabetes but we do have medication options to treat if that is persistent or more bothersome.  Let me know.  Lab visit in 1 month, visit with me in 4 months.  Take care.

## 2022-11-11 ENCOUNTER — Other Ambulatory Visit: Payer: Self-pay

## 2022-11-11 ENCOUNTER — Other Ambulatory Visit (HOSPITAL_COMMUNITY): Payer: Self-pay

## 2022-11-16 ENCOUNTER — Other Ambulatory Visit (HOSPITAL_COMMUNITY): Payer: Self-pay

## 2022-11-16 MED ORDER — TIMOLOL MALEATE 0.5 % OP SOLN
1.0000 [drp] | Freq: Every morning | OPHTHALMIC | 1 refills | Status: DC
  Filled 2022-11-16: qty 5, 25d supply, fill #0
  Filled 2023-08-05: qty 5, 50d supply, fill #0
  Filled 2023-10-09: qty 5, 50d supply, fill #1

## 2022-11-16 MED ORDER — VYZULTA 0.024 % OP SOLN
1.0000 [drp] | Freq: Every day | OPHTHALMIC | 3 refills | Status: DC
  Filled 2022-11-16 – 2022-12-13 (×2): qty 2.5, 25d supply, fill #0
  Filled 2023-02-08: qty 2.5, 18d supply, fill #0
  Filled 2023-03-03: qty 2.5, 50d supply, fill #0
  Filled 2023-03-03: qty 2.5, 25d supply, fill #0
  Filled 2023-03-09 (×2): qty 2.5, 30d supply, fill #0
  Filled 2023-05-05: qty 2.5, 25d supply, fill #0
  Filled 2023-06-23 (×2): qty 2.5, 25d supply, fill #1
  Filled 2023-06-26: qty 2.5, 25d supply, fill #0
  Filled 2023-08-05: qty 2.5, 25d supply, fill #1
  Filled 2023-10-09: qty 2.5, 25d supply, fill #2

## 2022-11-28 ENCOUNTER — Other Ambulatory Visit (HOSPITAL_COMMUNITY): Payer: Self-pay

## 2022-12-13 ENCOUNTER — Other Ambulatory Visit (HOSPITAL_COMMUNITY): Payer: Self-pay

## 2022-12-14 ENCOUNTER — Other Ambulatory Visit (HOSPITAL_COMMUNITY): Payer: Self-pay

## 2022-12-27 ENCOUNTER — Other Ambulatory Visit (HOSPITAL_COMMUNITY): Payer: Self-pay

## 2023-01-10 ENCOUNTER — Other Ambulatory Visit (HOSPITAL_COMMUNITY): Payer: Self-pay

## 2023-01-11 ENCOUNTER — Other Ambulatory Visit (HOSPITAL_COMMUNITY): Payer: Self-pay

## 2023-02-08 ENCOUNTER — Other Ambulatory Visit (HOSPITAL_COMMUNITY): Payer: Self-pay

## 2023-02-09 ENCOUNTER — Other Ambulatory Visit (HOSPITAL_COMMUNITY): Payer: Self-pay

## 2023-02-14 ENCOUNTER — Other Ambulatory Visit (HOSPITAL_COMMUNITY): Payer: Self-pay

## 2023-02-16 ENCOUNTER — Other Ambulatory Visit (HOSPITAL_COMMUNITY): Payer: Self-pay

## 2023-02-28 NOTE — Progress Notes (Signed)
CARDIOLOGY OFFICE NOTE  Date:  02/28/2023    Troy Yu Date of Birth: [birth year] Medical Record #[id number]  PCP:  Shade Flood, MD  Cardiologist:  Eden Emms     History of Present Illness: 62 y.o. referred back to cardiology after 3 year absence by Dr Neva Seat seen initially in ER June 2017 with subacute anterior MI complicated by distal embolization to right leg. Had a complicated RLE embolectomy requiring fasciotomy by Dr Imogene Burn with long healing process including wound Vac.  Had DES to distal RCA and mid LAD with EF 30-35% large apical thrombus. Diagnosed with DM Has refused a lot of meds and care.   Still wants to stop meds Discussed importance of ACE  But he never filled script Seen by Dr Arbie Cookey June 2019 told to stay on life long coumadin but not taking LDL 165 10/21/21 and he has not taken statin   "I think I'm doing ok and don't want to take unnecessary medication"  Doesn't take DM meds well A1c elevated 8.4 10/21/21   19yo daughter and 43 yo son visit him He has 4 children Ex wife went back to [place] for a while but back and keeps daughter from him  He wants referral back to Dr Arbie Cookey but he is retired   I discussed fact he should be on coreg/ARNI but he does not like taking too much medicine And  started on statin and Ozempic Seeing Dr Meredith Staggers in [facility]     Past Medical History:  Diagnosis Date   Diabetes (HCC)    Hypercholesteremia    LV (left ventricular) mural thrombus    NSTEMI (non-ST elevated myocardial infarction) [facility])     Past Surgical History:  Procedure Laterality Date   APPLICATION OF WOUND VAC Right 02/22/2016   Procedure: APPLICATION OF Negative Pressure Dressing Right Lower Leg;  Surgeon: Fransisco Hertz, MD;  Location: [facility] OR;  Service: Vascular;  Laterality: Right;   APPLICATION OF WOUND VAC Right 02/26/2016   Procedure: APPLICATION OF WOUND VAC, RIGHT MEDIAL LOWER LEG FASCIOTOMY SITE;  Surgeon: Larina Earthly, MD;  Location:  [facility] OR;  Service: Vascular;  Laterality: Right;   CARDIAC CATHETERIZATION N/A 03/09/2016   Procedure: Coronary Stent Intervention;  Surgeon: Iran Ouch, MD;  [facility] INVASIVE CV LAB;  Service: Cardiovascular;  Laterality: N/A;  distal RCA mid LAD   CARDIAC CATHETERIZATION N/A 03/09/2016   Procedure: Intravascular Pressure Wire/FFR Study;  Surgeon: Iran Ouch, MD;  Location: [facility] INVASIVE CV LAB;  Service: Cardiovascular;  Laterality: N/A;  mid LAD   CARDIAC CATHETERIZATION N/A 03/09/2016   Procedure: Left Heart Cath and Coronary Angiography;  Surgeon: Iran Ouch, MD;  [facility]: [facility] INVASIVE CV LAB;  Service: Cardiovascular;  Laterality: N/A;   EMBOLECTOMY Right 02/21/2016   Procedure: EMBOLECTOMY RIGHT COMMON FEMORAL ARTERY; FOUR COMPARTMENT FASCIOTOMY;  Surgeon: Fransisco Hertz, MD;  Location: [facility] OR;  Service: Vascular;  Laterality: Right;   FASCIOTOMY CLOSURE Right 02/26/2016   Procedure: RIGHT LATERAL LOWER LEG FASCIOTOMY CLOSURE;  Surgeon: Larina Earthly, MD;  Location: [facility] OR;  Service: Vascular;  Laterality: Right;   HEMATOMA EVACUATION Right 02/22/2016   Procedure: EVACUATION HEMATOMA With Placement of Negative Pressure Dressing;  Surgeon: Fransisco Hertz, MD;  Location: [facility] OR;  Service: Vascular;  Laterality: Right;     Medications: Current Outpatient Medications  Medication Sig Dispense Refill   aspirin EC 81 MG tablet Take 1 tablet (81 mg total)  by mouth daily.  Swallow whole. 90 tablet 3   atorvastatin (LIPITOR) 20 MG tablet Take 1 tablet (20 mg total) by mouth daily. 90 tablet 1   fluticasone (FLONASE) 50 MCG/ACT nasal spray Place 1 spray into both nostrils daily. 16 g 6   glucose blood (TRUE METRIX BLOOD GLUCOSE TEST) test strip Use 1 strip to check blood sugar 2 times daily 100 each 3   latanoprost (XALATAN) 0.005 % ophthalmic solution Place 1 drop into both eyes every evening as directed. 2.5 mL 6   Latanoprostene Bunod (VYZULTA) 0.024 % SOLN Place 1 drop into both eyes  every evening as directed 2.5 mL 1   Latanoprostene Bunod (VYZULTA) 0.024 % SOLN Place 1 drop into both eyes at night as directed 2.5 mL 3   Semaglutide, 2 MG/DOSE, 8 MG/3ML SOPN Inject 2 mg as directed once a week. 3 mL 3   timolol (TIMOPTIC) 0.5 % ophthalmic solution Place 1 drop into both eyes every morning as directed 5 mL 3   timolol (TIMOPTIC) 0.5 % ophthalmic solution Place 1 drop into both eyes in the morning as directed 5 mL 1   No current facility-administered medications for this visit.    Allergies: Allergies  Allergen Reactions   Metformin     Other reaction(s): Dizziness (intolerance)    Social History: The patient  reports that he has never smoked. He has never used smokeless tobacco. He reports that he does not drink alcohol and does not use drugs.   Family History: The patient's family history includes Heart Problems in his father and mother.   Review of Systems: Please see the history of present illness.   Otherwise, the review of systems is positive for none.   All other systems are reviewed and negative.   Physical Exam: VS:  There were no vitals taken for this visit. Marland Kitchen  BMI There is no height or weight on file to calculate BMI.  Wt Readings from Last 3 Encounters:  11/10/22 206 lb 3.2 oz (93.5 kg)  10/06/22 213 lb 12.8 oz (97 kg)  08/31/22 212 lb 6.4 oz (96.3 kg)   Affect appropriate Healthy:  appears stated age HEENT: normal Neck supple with no adenopathy JVP normal no bruits no thyromegaly Lungs clear with no wheezing and good diaphragmatic motion Heart:  S1/S2 no murmur, no rub, gallop or click PMI enlarged  Abdomen: benighn, BS positve, no tenderness, no AAA no bruit.  No HSM or HJR Distal pulses intact with no bruits No edema Neuro non-focal Skin warm and dry No muscular weakness Fasciotomy scars RLE with some foot drop      LABORATORY DATA:  EKG:   .07/16/19  SR rate 78 old anterior MI   Lab Results  Component Value Date   WBC 5.7  10/21/2021   HGB 17.4 (H) 10/21/2021   HCT 51.3 10/21/2021   PLT 205.0 10/21/2021   GLUCOSE 252 (H) 08/31/2022   CHOL 147 08/31/2022   TRIG 122.0 08/31/2022   HDL 45.00 08/31/2022   LDLCALC 78 08/31/2022   ALT 38 08/31/2022   AST 24 08/31/2022   NA 136 08/31/2022   K 4.1 08/31/2022   CL 102 08/31/2022   CREATININE 1.10 08/31/2022   BUN 19 08/31/2022   CO2 27 08/31/2022   TSH 3.200 03/03/2016   INR 2.4 09/16/2019   HGBA1C 8.5 (H) 08/31/2022   MICROALBUR 4.8 (H) 10/20/2021    BNP (last 3 results) No results for input(s): "BNP" in the last 8760  hours.  ProBNP (last 3 results) No results for input(s): "PROBNP" in the last 8760 hours.   Other Studies Reviewed Today:  Cardiac Cath Conclusion from 02/2016    Prox RCA lesion, 10% stenosed. Dist LAD lesion, 60% stenosed. Dist RCA lesion, 90% stenosed. Post intervention, there is a 0% residual stenosis. Mid LAD lesion, 60% stenosed. Post intervention, there is a 0% residual stenosis.   1. Significant 2 vessel coronary artery disease involving the distal right coronary artery and mid LAD. The culprit for recent myocardial infarction seems to be the mid LAD stenosis which appears to be thrombotic and hazy in spite of being only 60% in severity. However, this was significant by FFR.   2. Moderately elevated left ventricular end-diastolic pressure. LV angiography was not performed due to LV thrombus.   3. Successful angioplasty and drug-eluting stent placement to the distal right coronary artery and mid left anterior descending artery.     Recommendations: Recommend treatment with aspirin, Plavix and warfarin for one month. After one month, aspirin can be discontinued. Heparin can be resumed today 8 hours after sheath pull. Warfarin can be started. I switched from propranolol to carvedilol. Continue treatment for cardiomyopathy and add an ACE inhibitor or ARB before hospital discharge. There was residual 60% distal LAD stenosis  which was left to be treated medically.    Echo Study Conclusions from 12/19/17   Study Conclusions   - Left ventricle: The cavity size was normal. Systolic function was   moderately reduced. The estimated ejection fraction was in the   range of 35% to 40%. Doppler parameters are consistent with   abnormal left ventricular relaxation (grade 1 diastolic   dysfunction). Mild to moderate concentric and moderate focal   basal septal hypertrophy. - Regional wall motion abnormality: Akinesis of the mid   anteroseptal and apical myocardium; severe hypokinesis of the   apical anterior, apical inferior, and apical septal myocardium;   moderate hypokinesis of the apical lateral myocardium. - Aortic valve: There was mild regurgitation. - Aorta: Mild aortic root dilatation.  .   Assessment/Plan: 1. Anterior MI - July 2017  with associated LV dysfunction. Cardiac catheterization with DES to distal RCA and Mid LAD. Residual CAD noted. Would plan to treat medically.  81 mg ASA continue coreg See below    2. Ischemic CM - with EF of 35-40% by TTE 01/20/22 he has been hesitant and non compliant with GDMT    3. LV (left ventricular) mural thrombus (HCC) with embolization of right femoral artery resulting in limb ischemia s/p thrombectomy and fasciotomy with post operative course complicated by hematoma and bleeding s/p evacuation of hematoma with application of a VAC in the setting of therapeutic anti-coagulation. Seen by Dr. Arbie Cookey. Finally healed  TTE 12/19/17 thrombus resolved  now off coumadin TTE May 2023 no apical thormbus   4. Diabetes mellitus/Hyperglycemia -Hemoglobin A1c 8.5 .  f/u with primary currently not taking any meds  -supposed to be on Januvia   6. PVD:   Still with foot drop. F/u with [facility] Dr Early    7. Cholesterol:  non compliant with statin  Lab Results  Component Value Date   LDLCALC 78 08/31/2022      F/U in a year    Charlton Haws

## 2023-03-03 ENCOUNTER — Other Ambulatory Visit: Payer: Self-pay | Admitting: Family Medicine

## 2023-03-03 ENCOUNTER — Other Ambulatory Visit (HOSPITAL_COMMUNITY): Payer: Self-pay

## 2023-03-03 DIAGNOSIS — E1165 Type 2 diabetes mellitus with hyperglycemia: Secondary | ICD-10-CM

## 2023-03-04 ENCOUNTER — Other Ambulatory Visit (HOSPITAL_COMMUNITY): Payer: Self-pay

## 2023-03-06 ENCOUNTER — Other Ambulatory Visit (HOSPITAL_COMMUNITY): Payer: Self-pay

## 2023-03-06 MED ORDER — OZEMPIC (2 MG/DOSE) 8 MG/3ML ~~LOC~~ SOPN
2.0000 mg | PEN_INJECTOR | SUBCUTANEOUS | 3 refills | Status: DC
  Filled 2023-03-06: qty 3, 28d supply, fill #0
  Filled 2023-03-22 – 2023-03-27 (×2): qty 3, 28d supply, fill #1

## 2023-03-06 NOTE — Telephone Encounter (Signed)
Medication last discussed in March.  At that time planned lab only visit in 1 month, does not look like he had that lab performed.  I will refill Ozempic, needs to keep appointment scheduled July 15.

## 2023-03-06 NOTE — Telephone Encounter (Signed)
Patient is requesting a refill of the following medications: Requested Prescriptions   Pending Prescriptions Disp Refills   Semaglutide, 2 MG/DOSE, (OZEMPIC, 2 MG/DOSE,) 8 MG/3ML SOPN 3 mL 3    Sig: Inject 2 mg as directed once a week.    Date of patient request: 03/06/23 Last office visit: 11/10/22 Date of last refill: 11/10/22 Last refill amount: Follow up time period per chart:

## 2023-03-06 NOTE — Telephone Encounter (Signed)
Patient Name First: Troy Last: ALTAHERTA Yu Gender: Male DOB: 02-14-1961 Age: 62 Y 8 M 1 D Return Phone Number: [phone] (Primary) Address: City/ State/ Zip: [place]  [address] Client [facility] [place] Night - C Client Site [facility] - Night Provider Meredith Staggers- MD Contact Type Call Who Is Calling Patient / Member / Family / Caregiver Call Type Triage / Clinical Relationship To Patient Self Return Phone Number [phone] (Primary) Chief Complaint Prescription Refill or Medication Request (non symptomatic) Reason for Call Symptomatic / Request for Health Information Initial Comment Caller states he is needing an rx refill. Additional Comment The pt states the pharmacy closes in 15 minutes. Translation No Disp. Time [name] Disposition Final User 03/03/2023 6:20:16 PM Send To Nurse Shawnee Knapp, RN, Doree Fudge 03/03/2023 6:31:24 PM Attempt made - no message left Camery, RN, Efraim Kaufmann 03/03/2023 6:47:13 PM FINAL ATTEMPT MADE - no message left Camery, RN, Melissa 03/03/2023 6:47:18 PM Send to RN Final Attempt Ulla Potash, RN, Efraim Kaufmann 03/03/2023 9:38:48 PM Attempt made - no message left Popejoy, RN, Mardella Layman 03/03/2023 9:51:30 PM FINAL ATTEMPT MADE - no message left Yes Popejoy, RN, Mardella Layman Final Disposition 03/03/2023 9:51:30 PM FINAL ATTEMPT MADE - no message left Yes Popejoy, RN, Mardella Layman Comments User: Lauris Poag, RN Date/Time [name]: 03/03/2023 6:31:14 PM  Neva Seat has already put script in

## 2023-03-07 ENCOUNTER — Other Ambulatory Visit (HOSPITAL_COMMUNITY): Payer: Self-pay

## 2023-03-07 ENCOUNTER — Telehealth: Payer: Self-pay | Admitting: Family Medicine

## 2023-03-07 NOTE — Telephone Encounter (Signed)
Patient Name First: Troy Last: ALTAHERTA Yu Gender: Male DOB: 07/05/1961 Age: 62 Y 8 M 3 D Return Phone Number: [phone] (Primary) Address: City/ State/ Zip: [place]  [address] Client [facility] [place] Night - C Client Site [facility] - Night Provider Meredith Staggers- MD Contact Type Call Who Is Calling Patient / Member / Family / Caregiver Call Type Triage / Clinical Relationship To Patient Self Return Phone Number [phone] (Primary) Chief Complaint Prescription Refill or Medication Request (non symptomatic) Reason for Call Medication Question / Request Initial Comment Caller states he made a request for a refill, he hasn't received it yet. He made it on Saturday. He needs it before 6:00 when pharmacy closes. Translation No Nurse Assessment Nurse: Tennis Ship, RN, Clarisse Gouge Date/Time (Eastern Time): 03/06/2023 5:13:34 PM Confirm and document reason for call. If symptomatic, describe symptoms. ---Caller states needs a refill on ozempic and states he spoke with office today and med has not been called in. Caller very upset. Does the patient have any new or worsening symptoms? ---No Disp. Time [name] Disposition Final User 03/06/2023 5:17:45 PM Clinical Call Yes Tennis Ship RN, Clarisse Gouge Final Disposition 03/06/2023 5:17:45 PM Clinical Call Yes Tennis Ship, RN, Clarisse Gouge Comments User: Shanon Rosser, RN Date/Time [name]: 03/06/2023 5:17:27 PM Caller advised message would be sent to office but if he did not hear from office or pharmacy to call office when open. Caller very upset, states he has already called office and he is not calling again.  Medication refill is in future

## 2023-03-07 NOTE — Telephone Encounter (Signed)
I spoke to Troy Yu at [facility] and she states the pt picked up this medication yesterday evening .

## 2023-03-08 ENCOUNTER — Other Ambulatory Visit (HOSPITAL_COMMUNITY): Payer: Self-pay

## 2023-03-09 ENCOUNTER — Ambulatory Visit: Payer: PRIVATE HEALTH INSURANCE | Attending: Cardiovascular Disease | Admitting: Cardiovascular Disease

## 2023-03-09 ENCOUNTER — Encounter: Payer: Self-pay | Admitting: Cardiovascular Disease

## 2023-03-09 ENCOUNTER — Other Ambulatory Visit (HOSPITAL_COMMUNITY): Payer: Self-pay

## 2023-03-09 VITALS — BP 96/60 | HR 86 | Ht 68.0 in | Wt 202.6 lb

## 2023-03-09 DIAGNOSIS — I255 Ischemic cardiomyopathy: Secondary | ICD-10-CM

## 2023-03-09 DIAGNOSIS — I739 Peripheral vascular disease, unspecified: Secondary | ICD-10-CM

## 2023-03-09 DIAGNOSIS — E782 Mixed hyperlipidemia: Secondary | ICD-10-CM

## 2023-03-09 MED ORDER — AMOXICILLIN-POT CLAVULANATE 875-125 MG PO TABS
1.0000 | ORAL_TABLET | Freq: Two times a day (BID) | ORAL | 0 refills | Status: DC
  Filled 2023-03-09: qty 20, 10d supply, fill #0

## 2023-03-09 NOTE — Patient Instructions (Addendum)
Medication Instructions:  Your physician recommends that you continue on your current medications as directed. Please refer to the Current Medication list given to you today. Please take Augmentin 875/125 mg by mouth twice daily.   *If you need a refill on your cardiac medications before your next appointment, please call your pharmacy*  Lab Work: If you have labs (blood work) drawn today and your tests are completely normal, you will receive your results only by: MyChart Message (if you have MyChart) OR A paper copy in the mail If you have any lab test that is abnormal or we need to change your treatment, we will call you to review the results.  Testing/Procedures: None ordered today.  Follow-Up: At [facility], you and your health needs are our priority.  As part of our continuing mission to provide you with exceptional heart care, we have created designated Provider Care Teams.  These Care Teams include your primary Cardiologist (physician) and Advanced Practice Providers (APPs -  Physician Assistants and Nurse Practitioners) who all work together to provide you with the care you need, when you need it.  We recommend signing up for the patient portal called "MyChart".  Sign up information is provided on this After Visit Summary.  MyChart is used to connect with patients for Virtual Visits (Telemedicine).  Patients are able to view lab/test results, encounter notes, upcoming appointments, etc.  Non-urgent messages can be sent to your provider as well.   To learn more about what you can do with MyChart, go to [email or web address].    Your next appointment:   1 year(s)  Provider:   Charlton Haws, MD

## 2023-03-12 NOTE — Progress Notes (Deleted)
Subjective:  Patient ID: Troy Yu, male    DOB: 01-Sep-1960  Age: 62 y.o. MRN: [id number]  CC: No chief complaint on file.   HPI Troy Yu presents for   Diabetes: Uncontrolled with hyperglycemia, microalbuminuria, ischemic cardiomyopathy, history of CAD status post MI in 2017 with DES, medical treatment for residual CAD.  Ischemic cardiomyopathy with EF 35 to 40% by echo in 2023.  Recent cardiology eval noted on July 11.  Continued on Coreg, aspirin.  At that visit reportedly was not taking any meds, and noncompliant with statin treatment.  Supposed be on Lipitor 20 mg daily. Last visit with me in March. Treated with Ozempic, had recently increased from 1 mg to 2 mg at his last visit. Lab visit was recommended in 1 month which did not occur.  Home readings fasting Home readings postprandial Symptomatic lows Discussed possible neuropathic symptoms in feet at his last visit, no new meds started at that time.  Microalbumin: Due Optho, foot exam, pneumovax: Up-to-date.  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, without long-term current use of insulin (HCC) 11/16/2016   Chronic systolic congestive heart failure (HCC) 11/16/2016   Mural thrombus of cardiac  apex following MI (HCC) 11/16/2016   Noncompliance 11/16/2016   Old MI (myocardial infarction) 11/16/2016   Pure hypercholesterolemia 11/16/2016   History of fasciotomy 06/07/2016   Status post coronary artery stent placement    Acute combined systolic and diastolic heart failure (HCC)    Abnormal CXR    Abdominal pain    Chest pain    NSTEMI (non-ST elevated myocardial infarction) (HCC)    Fever    Thromboembolism (HCC)    Vascular occlusion    Pain    Anterior subendocardial MI (HCC) 02/21/2016   Right leg pain 02/21/2016   Non-STEMI (non-ST elevated myocardial infarction) (HCC) 02/21/2016   LV (left ventricular) mural thrombus 02/21/2016   Elevated troponin 02/21/2016   Abnormal EKG 02/21/2016   Acute MI (HCC) 02/21/2016   History of embolectomy 02/21/2016   Past Medical History:  Diagnosis Date   Diabetes (HCC)    Hypercholesteremia    LV (left ventricular) mural thrombus    NSTEMI (non-ST elevated myocardial infarction) [facility])    Past Surgical History:  Procedure Laterality Date   APPLICATION OF WOUND VAC Right 02/22/2016   Procedure: APPLICATION OF Negative Pressure Dressing Right Lower Leg;  Surgeon: Fransisco Hertz, MD;  Location[facility] OR;  Service: Vascular;  Laterality: Right;   APPLICATION OF WOUND VAC Right 02/26/2016   Procedure: APPLICATION OF WOUND VAC, RIGHT MEDIAL LOWER LEG FASCIOTOMY SITE;  Surgeon: Larina Earthly, MD;  [facility] OR;  Service: Vascular;  Laterality: Right;   CARDIAC CATHETERIZATION N/A 03/09/2016   Procedure: Coronary  Stent Intervention;  Surgeon: Iran Ouch, MD;  [facility] INVASIVE CV LAB;  Service: Cardiovascular;  Laterality: N/A;  distal RCA mid LAD   CARDIAC CATHETERIZATION N/A 03/09/2016   Procedure: Intravascular Pressure Wire/FFR Study;  Surgeon: Iran Ouch, MD;  Location: [facility] INVASIVE CV LAB;  Service: Cardiovascular;  Laterality: N/A;  mid LAD   CARDIAC CATHETERIZATION N/A 03/09/2016   Procedure: Left Heart Cath and Coronary  Angiography;  Surgeon: Iran Ouch, MD;  Location: [facility] INVASIVE CV LAB;  Service: Cardiovascular;  Laterality: N/A;   EMBOLECTOMY Right 02/21/2016   Procedure: EMBOLECTOMY RIGHT COMMON FEMORAL ARTERY; FOUR COMPARTMENT FASCIOTOMY;  Surgeon: Fransisco Hertz, MD;  Location: [facility] OR;  Service: Vascular;  Laterality: Right;   FASCIOTOMY CLOSURE Right 02/26/2016   Procedure: RIGHT LATERAL LOWER LEG FASCIOTOMY CLOSURE;  Surgeon: Larina Earthly, MD;  Location: [facility] OR;  Service: Vascular;  Laterality: Right;   HEMATOMA EVACUATION Right 02/22/2016   Procedure: EVACUATION HEMATOMA With Placement of Negative Pressure Dressing;  Surgeon: Fransisco Hertz, MD;  Location: [facility] OR;  Service: Vascular;  Laterality: Right;   Allergies  Allergen Reactions   Metformin     Other reaction(s): Dizziness (intolerance)   Prior to Admission medications   Medication Sig Start Date End Date Taking? Authorizing Provider  amoxicillin-clavulanate (AUGMENTIN) 875-125 MG tablet Take 1 tablet by mouth 2 (two) times daily. 03/09/23   Wendall Stade, MD  aspirin EC 81 MG tablet Take 1 tablet (81 mg total) by mouth daily.  Swallow whole. 11/09/22   Shade Flood, MD  atorvastatin (LIPITOR) 20 MG tablet Take 20 mg by mouth daily.    [provider]  fluticasone (FLONASE) 50 MCG/ACT nasal spray Place 1 spray into both nostrils daily. 07/25/22   Shade Flood, MD  glucose blood (TRUE METRIX BLOOD GLUCOSE TEST) test strip Use 1 strip to check blood sugar 2 times daily 01/11/22   Shade Flood, MD  Latanoprostene Bunod (VYZULTA) 0.024 % SOLN Place 1 drop into both eyes at night as directed 11/15/22     Semaglutide, 2 MG/DOSE, (OZEMPIC, 2 MG/DOSE,) 8 MG/3ML SOPN Inject 2 mg as directed once a week. 03/06/23   Shade Flood, MD  timolol (TIMOPTIC) 0.5 % ophthalmic solution Place 1 drop into both eyes in the morning as directed 11/15/22      Social History   Socioeconomic History   Marital status: Married    Spouse name: [name]    Number of children: 5   Years of education: COLLEGE   Highest education level: Not on file  Occupational History   Occupation: UNEMPLOYED  Tobacco Use   Smoking status: Never   Smokeless tobacco: Never  Vaping Use   Vaping status: Never Used  Substance and Sexual Activity   Alcohol use: No   Drug use: No   Sexual activity: Not on file  Other Topics Concern   Not on file  Social History Narrative   Not on file   Social Determinants of Health   Financial Resource Strain: Not on file  Food Insecurity: Not on file  Transportation Needs: Not on file  Physical Activity: Not on file  Stress: Not on file  Social Connections: Not on file  Intimate Partner Violence: Not on file    Review of Systems   Objective:  There were no vitals filed for this visit.   Physical Exam     Assessment & Plan:  Troy Yu is a 62  y.o. male . No diagnosis found.   No orders of the defined types were placed in this encounter.  There are no Patient Instructions on file for this visit.    Signed,   Meredith Staggers, MD [facility] 03/12/23 9:23 PM

## 2023-03-13 ENCOUNTER — Ambulatory Visit: Payer: PRIVATE HEALTH INSURANCE | Admitting: Family Medicine

## 2023-03-13 DIAGNOSIS — E785 Hyperlipidemia, unspecified: Secondary | ICD-10-CM

## 2023-03-13 DIAGNOSIS — E1165 Type 2 diabetes mellitus with hyperglycemia: Secondary | ICD-10-CM

## 2023-03-22 ENCOUNTER — Other Ambulatory Visit: Payer: Self-pay | Admitting: Family Medicine

## 2023-03-22 ENCOUNTER — Other Ambulatory Visit (HOSPITAL_COMMUNITY): Payer: Self-pay

## 2023-03-22 DIAGNOSIS — E785 Hyperlipidemia, unspecified: Secondary | ICD-10-CM

## 2023-03-23 ENCOUNTER — Other Ambulatory Visit: Payer: Self-pay

## 2023-03-23 ENCOUNTER — Other Ambulatory Visit (HOSPITAL_COMMUNITY): Payer: Self-pay

## 2023-03-23 MED ORDER — ATORVASTATIN CALCIUM 20 MG PO TABS
20.0000 mg | ORAL_TABLET | Freq: Every day | ORAL | 1 refills | Status: DC
Start: 2023-03-23 — End: 2023-10-09
  Filled 2023-03-23 – 2023-06-23 (×2): qty 90, 90d supply, fill #0
  Filled 2023-06-23 (×2): qty 90, 90d supply, fill #1
  Filled 2023-06-26: qty 90, 90d supply, fill #0

## 2023-03-27 ENCOUNTER — Ambulatory Visit (INDEPENDENT_AMBULATORY_CARE_PROVIDER_SITE_OTHER): Payer: PRIVATE HEALTH INSURANCE | Admitting: Family Medicine

## 2023-03-27 ENCOUNTER — Encounter: Payer: Self-pay | Admitting: Family Medicine

## 2023-03-27 ENCOUNTER — Other Ambulatory Visit (HOSPITAL_COMMUNITY): Payer: Self-pay

## 2023-03-27 VITALS — BP 128/82 | HR 82 | Temp 98.4°F | Ht 68.0 in | Wt 206.8 lb

## 2023-03-27 DIAGNOSIS — E1165 Type 2 diabetes mellitus with hyperglycemia: Secondary | ICD-10-CM | POA: Diagnosis not present

## 2023-03-27 DIAGNOSIS — E113313 Type 2 diabetes mellitus with moderate nonproliferative diabetic retinopathy with macular edema, bilateral: Secondary | ICD-10-CM

## 2023-03-27 DIAGNOSIS — Z7985 Long-term (current) use of injectable non-insulin antidiabetic drugs: Secondary | ICD-10-CM

## 2023-03-27 MED ORDER — OZEMPIC (2 MG/DOSE) 8 MG/3ML ~~LOC~~ SOPN
2.0000 mg | PEN_INJECTOR | SUBCUTANEOUS | 1 refills | Status: DC
Start: 2023-03-27 — End: 2023-05-03

## 2023-03-27 NOTE — Progress Notes (Signed)
Subjective:  Patient ID: Troy Yu, male    DOB: 10-04-1960  Age: 62 y.o. MRN: [id number]  CC:  Chief Complaint  Patient presents with   Medical Management of Chronic Issues    Pt is doing well, requesting ozempic for 90 days at a time     HPI Troy Yu presents for   Diabetes: Stated by hyperglycemia, microalbuminuria, retinopathy, ischemic cardiomyopathy followed by cardiology.  Previous Ozempic, coverage issues with transfer to Trulicity, then back to Ozempic, with increasing dosages, up to 2 mg dose per week as of his last visit in March.  Last A1c was elevated in January, but transition in meds as above during that time. Taking ozempic 2mg  weekly. No side effects - no n/v abd pain.   Home readings fasting: 106, 116, 130, 172 today.  Postprandial: 130-140's  no 200's. Symptomatic lows: none. Lowest 106.   He is on statin with Lipitor 20 mg daily, however concern regarding compliance at his last cardiology visit. Question of whether this had been discontinued , off for about a week, back on this week now. Daily use prior. Not fasting today.   Slight HA few days ago - resolved.   Cardiology appointment July 11 noted.  Medical treatment for residual CAD with aspirin, Coreg.  EF 35 to 40% by TTE in May 2023, hesitant, noncompliant with GDMT per cardiology note.  Followed by vascular, Dr. Arbie Cookey, prior embolization of right femoral artery with limb ischemia, thrombectomy, fasciotomy, postop hematoma and bleeding with application of VAC in the setting of therapeutic anticoagulation, ultimately healed and off Coumadin.  No apical thrombus on TTE in May 2023.  Microalbumin: Due, 4.8 in February 2023. Optho, foot exam, pneumovax: Up-to-date.  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, without long-term current use of insulin (HCC) 11/16/2016   Chronic systolic congestive heart failure (HCC) 11/16/2016   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/2016   Noncompliance 11/16/2016   Old MI (myocardial infarction) 11/16/2016   Pure hypercholesterolemia 11/16/2016   History of fasciotomy 06/07/2016   Status post coronary artery stent placement    Acute combined systolic and diastolic heart failure (HCC)    Abnormal CXR    Abdominal pain    Chest pain    NSTEMI (non-ST elevated myocardial infarction) (HCC)    Fever    Thromboembolism (HCC)    Vascular occlusion    Pain    Anterior subendocardial MI (HCC) 02/21/2016   Right leg pain 02/21/2016   Non-STEMI (non-ST elevated myocardial infarction) (HCC) 02/21/2016   LV (left ventricular) mural thrombus 02/21/2016   Elevated troponin 02/21/2016   Abnormal EKG 02/21/2016   Acute MI (HCC) 02/21/2016   History of embolectomy 02/21/2016   Past Medical History:  Diagnosis Date   Diabetes (HCC)    Hypercholesteremia    LV (left ventricular) mural thrombus    NSTEMI (non-ST elevated myocardial infarction) [facility])    Past Surgical History:  Procedure Laterality Date   APPLICATION OF WOUND VAC Right 02/22/2016   Procedure: APPLICATION OF Negative Pressure Dressing Right Lower Leg;  Surgeon: Fransisco Hertz, MD;  Location: [facility] OR;  Service: Vascular;  Laterality: Right;   APPLICATION OF WOUND VAC  Right 02/26/2016   Procedure: APPLICATION OF WOUND VAC, RIGHT MEDIAL LOWER LEG FASCIOTOMY SITE;  Surgeon: Larina Earthly, MD;  Location: [facility] OR;  Service: Vascular;  Laterality: Right;   CARDIAC CATHETERIZATION N/A 03/09/2016   Procedure: Coronary Stent Intervention;  Surgeon: Iran Ouch, MD;  Location: [facility] INVASIVE CV LAB;  Service: Cardiovascular;  Laterality: N/A;  distal RCA mid LAD   CARDIAC CATHETERIZATION N/A 03/09/2016   Procedure: Intravascular Pressure Wire/FFR Study;  Surgeon: Iran Ouch, MD;  Location: [facility] INVASIVE CV LAB;  Service: Cardiovascular;  Laterality: N/A;  mid LAD   CARDIAC CATHETERIZATION N/A 03/09/2016   Procedure: Left Heart Cath and Coronary Angiography;  Surgeon: Iran Ouch, MD;  Location: [facility] INVASIVE CV LAB;  Service: Cardiovascular;  Laterality: N/A;   EMBOLECTOMY Right 02/21/2016   Procedure: EMBOLECTOMY RIGHT COMMON FEMORAL ARTERY; FOUR COMPARTMENT FASCIOTOMY;  Surgeon: Fransisco Hertz, MD;  Location: [facility] OR;  Service: Vascular;  Laterality: Right;   FASCIOTOMY CLOSURE Right 02/26/2016   Procedure: RIGHT LATERAL LOWER LEG FASCIOTOMY CLOSURE;  Surgeon: Larina Earthly, MD;  Location: [facility] OR;  Service: Vascular;  Laterality: Right;   HEMATOMA EVACUATION Right 02/22/2016   Procedure: EVACUATION HEMATOMA With Placement of Negative Pressure Dressing;  Surgeon: Fransisco Hertz, MD;  Location: [facility] OR;  Service: Vascular;  Laterality: Right;   Allergies  Allergen Reactions   Metformin     Other reaction(s): Dizziness (intolerance)   Prior to Admission medications   Medication Sig Start Date End Date Taking? Authorizing Provider  aspirin EC 81 MG tablet Take 1 tablet (81 mg total) by mouth daily.  Swallow whole. 11/09/22  Yes Shade Flood, MD  atorvastatin (LIPITOR) 20 MG tablet Take 20 mg by mouth daily.   Yes [provider]  atorvastatin (LIPITOR) 20 MG tablet Take 1 tablet (20 mg total) by mouth daily. 03/23/23  Yes Shade Flood, MD  fluticasone  (FLONASE) 50 MCG/ACT nasal spray Place 1 spray into both nostrils daily. 07/25/22  Yes Shade Flood, MD  glucose blood (TRUE METRIX BLOOD GLUCOSE TEST) test strip Use 1 strip to check blood sugar 2 times daily 01/11/22  Yes Shade Flood, MD  Latanoprostene Bunod (VYZULTA) 0.024 % SOLN Place 1 drop into both eyes at night as directed 11/15/22  Yes   Semaglutide, 2 MG/DOSE, (OZEMPIC, 2 MG/DOSE,) 8 MG/3ML SOPN Inject 2 mg as directed once a week. 03/06/23  Yes Shade Flood, MD  timolol (TIMOPTIC) 0.5 % ophthalmic solution Place 1 drop into both eyes in the morning as directed 11/15/22  Yes   amoxicillin-clavulanate (AUGMENTIN) 875-125 MG tablet Take 1 tablet by mouth 2 (two) times daily. Patient not taking: Reported on 03/27/2023 03/09/23   Wendall Stade, MD   Social History   Socioeconomic History   Marital status: Married    Spouse name: [name]   Number of children: 5   Years of education: COLLEGE   Highest education level: Not on file  Occupational History   Occupation: UNEMPLOYED  Tobacco  Use   Smoking status: Never   Smokeless tobacco: Never  Vaping Use   Vaping status: Never Used  Substance and Sexual Activity   Alcohol use: No   Drug use: No   Sexual activity: Not on file  Other Topics Concern   Not on file  Social History Narrative   Not on file   Social Determinants of Health   Financial Resource Strain: Not on file  Food Insecurity: Not on file  Transportation Needs: Not on file  Physical Activity: Not on file  Stress: Not on file  Social Connections: Not on file  Intimate Partner Violence: Not on file    Review of Systems Per HPI   Objective:   Vitals:   03/27/23 1342  BP: 128/82  Pulse: 82  Temp: 98.4 F (36.9 C)  TempSrc: Temporal  SpO2: 97%  Weight: 206 lb 12.8 oz (93.8 kg)  Height: 5\' 8"  (1.727 m)     Physical Exam Vitals reviewed.  Constitutional:      Appearance: He is well-developed.  HENT:     Head: Normocephalic and  atraumatic.  Neck:     Vascular: No carotid bruit or JVD.  Cardiovascular:     Rate and Rhythm: Normal rate and regular rhythm.     Heart sounds: Normal heart sounds. No murmur heard. Pulmonary:     Effort: Pulmonary effort is normal.     Breath sounds: Normal breath sounds. No rales.  Musculoskeletal:     Right lower leg: No edema.     Left lower leg: No edema.  Skin:    General: Skin is warm and dry.  Neurological:     Mental Status: He is alert and oriented to person, place, and time.  Psychiatric:        Mood and Affect: Mood normal.        Assessment & Plan:  Mcdonald AL Micki Riley Chinita Greenland is a 62 y.o. male . Type 2 diabetes mellitus with both eyes affected by moderate nonproliferative retinopathy and macular edema, without long-term current use of insulin (HCC) - Plan: Comprehensive metabolic panel, Hemoglobin A1c, Microalbumin / creatinine urine ratio  Type 2 diabetes mellitus with hyperglycemia, without long-term current use of insulin (HCC) - Plan: Semaglutide, 2 MG/DOSE, (OZEMPIC, 2 MG/DOSE,) 8 MG/3ML SOPN Tolerating current med regimen with reported stable readings as above.  Continue same dose Ozempic for now, check A1c, urine microalbumin and adjust regimen, plan accordingly.  Not fasting, check lipids next visit.  No med changes otherwise today.  Meds ordered this encounter  Medications   Semaglutide, 2 MG/DOSE, (OZEMPIC, 2 MG/DOSE,) 8 MG/3ML SOPN    Sig: Inject 2 mg as directed once a week.    Dispense:  9 mL    Refill:  1   Patient Instructions  Ozempic was printed so you can check at various pharmacies for a 21-month supply.  No change in dose of medicine for now.  I will check labs today and let you know if there are any concerns.  We can check cholesterol levels next time.  Recheck in 3 months. Take care!      Signed,   Meredith Staggers, MD [facility], [facility] 03/27/23 2:14 PM

## 2023-03-27 NOTE — Patient Instructions (Addendum)
Ozempic was printed so you can check at various pharmacies for a 31-month supply.  No change in dose of medicine for now.  I will check labs today and let you know if there are any concerns.  We can check cholesterol levels next time.  Recheck in 3 months. Take care!

## 2023-04-07 ENCOUNTER — Other Ambulatory Visit (HOSPITAL_COMMUNITY): Payer: Self-pay

## 2023-04-28 ENCOUNTER — Other Ambulatory Visit (HOSPITAL_COMMUNITY): Payer: Self-pay

## 2023-04-28 ENCOUNTER — Other Ambulatory Visit: Payer: Self-pay | Admitting: Family Medicine

## 2023-04-28 DIAGNOSIS — E1165 Type 2 diabetes mellitus with hyperglycemia: Secondary | ICD-10-CM

## 2023-05-03 ENCOUNTER — Other Ambulatory Visit (HOSPITAL_COMMUNITY): Payer: Self-pay

## 2023-05-03 ENCOUNTER — Other Ambulatory Visit: Payer: Self-pay

## 2023-05-03 ENCOUNTER — Telehealth: Payer: Self-pay | Admitting: Family Medicine

## 2023-05-03 DIAGNOSIS — E1165 Type 2 diabetes mellitus with hyperglycemia: Secondary | ICD-10-CM

## 2023-05-03 MED ORDER — OZEMPIC (2 MG/DOSE) 8 MG/3ML ~~LOC~~ SOPN
2.0000 mg | PEN_INJECTOR | SUBCUTANEOUS | 1 refills | Status: DC
Start: 2023-05-03 — End: 2023-05-04

## 2023-05-03 NOTE — Telephone Encounter (Signed)
Pt is requesting the prescription to be sent to the community pharmacy at [facility], not [facility]

## 2023-05-03 NOTE — Telephone Encounter (Signed)
Cancel Resending, pt is going to pickup at [facility]

## 2023-05-03 NOTE — Telephone Encounter (Signed)
Faxed to ensure receipt

## 2023-05-03 NOTE — Telephone Encounter (Signed)
Ordered refill, LM to inform pt this has been sent in

## 2023-05-03 NOTE — Telephone Encounter (Signed)
Caller name: [place] AL Alvia Grove  On DPR?: Yes  Call back number: [phone] (mobile)  Provider they see: Shade Flood, MD  Reason for call:   Pt is stating pharmacy told him prescription was canceled, I see 1 refill he needs clarity he states he has been out 2 weeks.  Semaglutide, 2 MG/DOSE, (OZEMPIC, 2 MG/DOSE,) 8 MG/3ML SOPN   [facility] P: [phone]

## 2023-05-04 ENCOUNTER — Other Ambulatory Visit: Payer: Self-pay | Admitting: Family Medicine

## 2023-05-04 ENCOUNTER — Other Ambulatory Visit (HOSPITAL_COMMUNITY): Payer: Self-pay

## 2023-05-04 DIAGNOSIS — E1165 Type 2 diabetes mellitus with hyperglycemia: Secondary | ICD-10-CM

## 2023-05-04 MED ORDER — OZEMPIC (2 MG/DOSE) 8 MG/3ML ~~LOC~~ SOPN
2.0000 mg | PEN_INJECTOR | SUBCUTANEOUS | 1 refills | Status: DC
Start: 2023-05-04 — End: 2023-10-31
  Filled 2023-05-04 – 2023-05-05 (×2): qty 3, 28d supply, fill #0
  Filled 2023-05-30: qty 3, 28d supply, fill #1
  Filled 2023-05-31: qty 3, 28d supply, fill #0
  Filled 2023-06-23: qty 3, 28d supply, fill #1
  Filled 2023-06-23: qty 3, 28d supply, fill #2
  Filled 2023-06-23 – 2023-06-26 (×2): qty 3, 28d supply, fill #1
  Filled 2023-08-05: qty 3, 28d supply, fill #2
  Filled 2023-08-31: qty 3, 28d supply, fill #3
  Filled 2023-10-09: qty 3, 28d supply, fill #4

## 2023-05-04 NOTE — Progress Notes (Signed)
Reordered ozempic. Pharmacy did not receive fax.

## 2023-05-05 ENCOUNTER — Other Ambulatory Visit (HOSPITAL_COMMUNITY): Payer: Self-pay

## 2023-05-30 ENCOUNTER — Other Ambulatory Visit (HOSPITAL_COMMUNITY): Payer: Self-pay

## 2023-05-31 ENCOUNTER — Other Ambulatory Visit (HOSPITAL_COMMUNITY): Payer: Self-pay

## 2023-05-31 ENCOUNTER — Other Ambulatory Visit: Payer: Self-pay

## 2023-06-23 ENCOUNTER — Other Ambulatory Visit: Payer: Self-pay

## 2023-06-23 ENCOUNTER — Other Ambulatory Visit (HOSPITAL_COMMUNITY): Payer: Self-pay

## 2023-06-26 ENCOUNTER — Other Ambulatory Visit (HOSPITAL_COMMUNITY): Payer: Self-pay

## 2023-06-26 ENCOUNTER — Other Ambulatory Visit: Payer: Self-pay

## 2023-06-29 ENCOUNTER — Ambulatory Visit: Payer: PRIVATE HEALTH INSURANCE | Admitting: Family Medicine

## 2023-07-03 ENCOUNTER — Other Ambulatory Visit: Payer: Self-pay

## 2023-07-03 ENCOUNTER — Telehealth: Payer: Self-pay | Admitting: Family Medicine

## 2023-07-03 ENCOUNTER — Encounter: Payer: Self-pay | Admitting: Family Medicine

## 2023-07-03 ENCOUNTER — Emergency Department (HOSPITAL_BASED_OUTPATIENT_CLINIC_OR_DEPARTMENT_OTHER): Payer: No Typology Code available for payment source | Admitting: Radiology

## 2023-07-03 ENCOUNTER — Ambulatory Visit (INDEPENDENT_AMBULATORY_CARE_PROVIDER_SITE_OTHER): Payer: No Typology Code available for payment source | Admitting: Family Medicine

## 2023-07-03 ENCOUNTER — Emergency Department (HOSPITAL_BASED_OUTPATIENT_CLINIC_OR_DEPARTMENT_OTHER)
Admission: EM | Admit: 2023-07-03 | Discharge: 2023-07-03 | Disposition: A | Discharge status: Home / Self Care | Payer: No Typology Code available for payment source | Attending: Emergency Medicine | Admitting: Emergency Medicine

## 2023-07-03 VITALS — BP 136/84 | HR 88 | Temp 98.6°F | Ht 68.0 in | Wt 207.8 lb

## 2023-07-03 DIAGNOSIS — E119 Type 2 diabetes mellitus without complications: Secondary | ICD-10-CM | POA: Insufficient documentation

## 2023-07-03 DIAGNOSIS — K219 Gastro-esophageal reflux disease without esophagitis: Secondary | ICD-10-CM | POA: Insufficient documentation

## 2023-07-03 DIAGNOSIS — Z7984 Long term (current) use of oral hypoglycemic drugs: Secondary | ICD-10-CM | POA: Diagnosis not present

## 2023-07-03 DIAGNOSIS — R079 Chest pain, unspecified: Secondary | ICD-10-CM

## 2023-07-03 DIAGNOSIS — R1010 Upper abdominal pain, unspecified: Secondary | ICD-10-CM | POA: Diagnosis not present

## 2023-07-03 DIAGNOSIS — E1165 Type 2 diabetes mellitus with hyperglycemia: Secondary | ICD-10-CM | POA: Diagnosis not present

## 2023-07-03 DIAGNOSIS — R109 Unspecified abdominal pain: Secondary | ICD-10-CM

## 2023-07-03 DIAGNOSIS — Z7982 Long term (current) use of aspirin: Secondary | ICD-10-CM | POA: Diagnosis not present

## 2023-07-03 DIAGNOSIS — R1110 Vomiting, unspecified: Secondary | ICD-10-CM

## 2023-07-03 LAB — CBC
HCT: 49.7 % (ref 39.0–52.0)
Hemoglobin: 17.7 g/dL — ABNORMAL HIGH (ref 13.0–17.0)
MCH: 31.2 pg (ref 26.0–34.0)
MCHC: 35.6 g/dL (ref 30.0–36.0)
MCV: 87.5 fL (ref 80.0–100.0)
Platelets: 202 10*3/uL (ref 150–400)
RBC: 5.68 MIL/uL (ref 4.22–5.81)
RDW: 12.6 % (ref 11.5–15.5)
WBC: 9.6 10*3/uL (ref 4.0–10.5)
nRBC: 0 % (ref 0.0–0.2)

## 2023-07-03 LAB — TROPONIN I (HIGH SENSITIVITY): Troponin I (High Sensitivity): 5 ng/L (ref <18)

## 2023-07-03 LAB — URINALYSIS, ROUTINE W REFLEX MICROSCOPIC
Bilirubin Urine: NEGATIVE
Glucose, UA: 500 mg/dL — AB
Hgb urine dipstick: NEGATIVE
Ketones, ur: NEGATIVE mg/dL
Leukocytes,Ua: NEGATIVE
Nitrite: NEGATIVE
Specific Gravity, Urine: 1.028 (ref 1.005–1.030)
pH: 5.5 (ref 5.0–8.0)

## 2023-07-03 LAB — BASIC METABOLIC PANEL
Anion gap: 8 (ref 5–15)
BUN: 16 mg/dL (ref 8–23)
CO2: 25 mmol/L (ref 22–32)
Calcium: 9.2 mg/dL (ref 8.9–10.3)
Chloride: 98 mmol/L (ref 98–111)
Creatinine, Ser: 0.89 mg/dL (ref 0.61–1.24)
GFR, Estimated: >60 mL/min (ref >60)
Glucose, Bld: 159 mg/dL — ABNORMAL HIGH (ref 70–99)
Potassium: 4.1 mmol/L (ref 3.5–5.1)
Sodium: 131 mmol/L — ABNORMAL LOW (ref 135–145)

## 2023-07-03 LAB — LIPASE, BLOOD: Lipase: 35 U/L (ref 11–51)

## 2023-07-03 MED ORDER — ONDANSETRON 4 MG PO TBDP
8.0000 mg | ORAL_TABLET | Freq: Once | ORAL | Status: AC
  Administered 2023-07-03: 8 mg via ORAL
  Filled 2023-07-03: qty 2

## 2023-07-03 MED ORDER — ALUM & MAG HYDROXIDE-SIMETH 200-200-20 MG/5ML PO SUSP
30.0000 mL | Freq: Once | ORAL | Status: AC
  Administered 2023-07-03: 30 mL via ORAL
  Filled 2023-07-03: qty 30

## 2023-07-03 MED ORDER — LIDOCAINE VISCOUS HCL 2 % MT SOLN
15.0000 mL | Freq: Once | OROMUCOSAL | Status: AC
  Administered 2023-07-03: 15 mL via ORAL
  Filled 2023-07-03: qty 15

## 2023-07-03 NOTE — Telephone Encounter (Signed)
Noted thank you. Patient seen and advised ER eval.

## 2023-07-03 NOTE — ED Provider Notes (Signed)
EMERGENCY DEPARTMENT AT [facility] Provider Note   CSN: [id number] Arrival date & time: 07/03/23  1904     History  Chief Complaint  Patient presents with   Abdominal Pain    Troy AL Taher Chinita Yu is a 62 y.o. male.  With a past history of MI, type 2 diabetes, dilated cardiomyopathy who presents to the ED for abdominal pain.  Abdominal pain since dinner last night.  Had a meal of thin bread.  No one else got sick after the meal.  Since then has experienced burning pain in his stomach radiating upwards through his chest.  Seen by PCP earlier today and sent here for further evaluation.  No shortness of breath nausea vomiting diaphoresis fevers chills or recent illness.   Abdominal Pain      Home Medications Prior to Admission medications   Medication Sig Start Date End Date Taking? Authorizing Provider  aspirin EC 81 MG tablet Take 1 tablet (81 mg total) by mouth daily.  Swallow whole. 11/09/22   Shade Flood, MD  atorvastatin (LIPITOR) 20 MG tablet Take 1 tablet (20 mg total) by mouth daily. 03/23/23   Shade Flood, MD  fluticasone (FLONASE) 50 MCG/ACT nasal spray Place 1 spray into both nostrils daily. 07/25/22   Shade Flood, MD  glucose blood (TRUE METRIX BLOOD GLUCOSE TEST) test strip Use 1 strip to check blood sugar 2 times daily 01/11/22   Shade Flood, MD  Latanoprostene Bunod (VYZULTA) 0.024 % SOLN Place 1 drop into both eyes at night as directed 11/15/22     Semaglutide, 2 MG/DOSE, (OZEMPIC, 2 MG/DOSE,) 8 MG/3ML SOPN Inject 2 mg as directed once a week. 05/04/23   Shade Flood, MD  timolol (TIMOPTIC) 0.5 % ophthalmic solution Place 1 drop into both eyes in the morning as directed 11/15/22         Allergies    Metformin    Review of Systems   Review of Systems  Gastrointestinal:  Positive for abdominal pain.    Physical Exam Updated Vital Signs BP (!) 145/86   Pulse 84   Temp 98.2 F (36.8 C)   Resp 19   SpO2 97%  Physical  Exam Vitals and nursing note reviewed.  HENT:     Head: Normocephalic and atraumatic.  Eyes:     Pupils: Pupils are equal, round, and reactive to light.  Cardiovascular:     Rate and Rhythm: Normal rate and regular rhythm.  Pulmonary:     Effort: Pulmonary effort is normal.     Breath sounds: Normal breath sounds.  Abdominal:     Palpations: Abdomen is soft.     Tenderness: There is no abdominal tenderness.  Skin:    General: Skin is warm and dry.  Neurological:     Mental Status: He is alert.  Psychiatric:        Mood and Affect: Mood normal.     ED Results / Procedures / Treatments   Labs (all labs ordered are listed, but only abnormal results are displayed) Labs Reviewed  BASIC METABOLIC PANEL - Abnormal; Notable for the following components:      Result Value   Sodium 131 (*)    Glucose, Bld 159 (*)    All other components within normal limits  CBC - Abnormal; Notable for the following components:   Hemoglobin 17.7 (*)    All other components within normal limits  URINALYSIS, ROUTINE W REFLEX MICROSCOPIC - Abnormal; Notable for  the following components:   Glucose, UA 500 (*)    Protein, ur TRACE (*)    Bacteria, UA RARE (*)    All other components within normal limits  LIPASE, BLOOD  TROPONIN I (HIGH SENSITIVITY)  TROPONIN I (HIGH SENSITIVITY)    EKG EKG Interpretation Date/Time:  Monday July 03 2023 19:21:55 EST Ventricular Rate:  90 PR Interval:  164 QRS Duration:  98 QT Interval:  358 QTC Calculation: 437 R Axis:   -27  Text Interpretation: Normal sinus rhythm Incomplete right bundle branch block Inferior infarct (cited on or before 21-Feb-2016) Anterolateral infarct (cited on or before 21-Feb-2016) Abnormal ECG When compared with ECG of 09-Mar-2023 15:54, Incomplete right bundle branch block is now Present Non-specific change in ST segment in Lateral leads Confirmed by Estelle June [id number]) on 07/03/2023 10:35:52 PM  Radiology DG Chest 2  View  Result Date: 07/03/2023 CLINICAL DATA:  Chest pain. EXAM: CHEST - 2 VIEW COMPARISON:  Chest radiograph dated 03/06/2016. FINDINGS: No focal consolidation, pleural effusion, pneumothorax. The cardiac silhouette is within normal limits. No acute osseous pathology. IMPRESSION: No active cardiopulmonary disease. Electronically Signed   By: Elgie Collard M.D.   On: 07/03/2023 20:33    Procedures Procedures    Medications Ordered in ED Medications  alum & mag hydroxide-simeth (MAALOX/MYLANTA) 200-200-20 MG/5ML suspension 30 mL (30 mLs Oral Given 07/03/23 2249)    And  lidocaine (XYLOCAINE) 2 % viscous mouth solution 15 mL (15 mLs Oral Given 07/03/23 2249)  ondansetron (ZOFRAN-ODT) disintegrating tablet 8 mg (8 mg Oral Given 07/03/23 2250)    ED Course/ Medical Decision Making/ A&P                                 Medical Decision Making 62 year old male with extensive cardiac history as above presenting for abdominal pain.  Afebrile and slightly hypertensive here.  No significant abdominal tenderness on exam.  Presentation most concerning for potential ACS considering his long cardiac history.  High-sensitivity troponin and EKG show no evidence of ACS or ischemic changes.  Laboratory workup notable for glucosuria consistent with poorly controlled type 2 diabetes.  Symptomatic improvement with Maalox lidocaine.  Clinical history and symptomatic relief with this intervention most suggestive of acid reflux/GERD.  Low suspicion for pneumonia based on clear chest x-ray.  He will follow-up with his primary care doctor to discuss further GERD management.  Amount and/or Complexity of Data Reviewed Labs: ordered. Radiology: ordered.  Risk OTC drugs. Prescription drug management.           Final Clinical Impression(s) / ED Diagnoses Final diagnoses:  Gastroesophageal reflux disease, unspecified whether esophagitis present  Abdominal pain, unspecified abdominal location    Rx / DC  Orders ED Discharge Orders     None         Royanne Foots, DO 07/03/23 2340

## 2023-07-03 NOTE — ED Triage Notes (Signed)
Abd pain since eating yesterday. Generalized. Emesis x1 today. Seen by PCP today-EKG performed and sent here for follow up. HX MI 2017.

## 2023-07-03 NOTE — Patient Instructions (Addendum)
Thank you for coming to see me today.  Unfortunately I am concerned about your chest and upper abdominal symptoms, especially with your heart history.  You do need to be seen in the emergency room after leaving the office.  As you have chosen to drive by personal vehicle instead of paramedic, pull over and call 911 if any worsening or changing symptoms as we discussed.  I will watch for the report from the emergency room and can follow-up afterwards in the next week or 2.  We can discuss your chronic medications and medicines further at that time.  Hang in there!  Here is the location of the nearest emergency room as requested, but as we discussed if they feel like you need further evaluation or hospitalization you may be transported by ambulance to the hospital:  [facility] Address: [address] [place], [place] [address] Phone: [phone]

## 2023-07-03 NOTE — Telephone Encounter (Signed)
I sent pt to Triage because he stated that he had pain under his rib cage but stated when asked no problems breathing. Also stated he didn't need triage just wanted to come straight to [facility]'s office. I advised him to seek triage.

## 2023-07-03 NOTE — Progress Notes (Signed)
Subjective:  Patient ID: Troy Yu, male    DOB: 14-Dec-1960  Age: 62 y.o. MRN: [id number]  CC:  Chief Complaint  Patient presents with   Chest Pain    Pt notes some chest pain since beginning of the day notes has a burning gas feeling in his stomach and moved up into his chest as well, thinks it is heart burn but wants to be sure, also notes had an episode at 8-9 pm on Saturday had some stress at work     HPI Troy Yu presents for   Chest Pain: Noted late last night into early hours of this morning. Upper stomach - pressure upper stomach to chest- lower chest. Some pain into R upper back.  Lower chest pain if taking in deep breath, but not short of breath.  No arm or neck radiation - moves to back at times, not currently.  No nausea/vomiting. Continuous pain since last night, pain in upper stomach, lower center chest. No sweating.  Ate something spicy last night. Some gas in stomach.  Some stress at work, heated conversation 2 nights ago.  Ozempic dose today. Took asa today  Tx: none.  Pain/discomfort 7/10 right now.    History of diabetes, improved treated with Ozempic, and has been on statin with Lipitor 20 mg daily with intermittent compliance previously.  Lab Results  Component Value Date   HGBA1C 6.9 (H) 03/27/2023     History of ischemic cardiomyopathy.  Cardiology note reviewed from July 11.  Subacute anterior MI in 2017 complicated by distal embolization to right leg.  Right lower extremity embolectomy requiring fasciotomy.  Drug-eluting stent to distal RCA and mid LAD with EF 30 to 35%, large apical thrombus.  History of nonadherence to recommended meds from cardiology, and vascular, ACE inhibitor and anticoagulation recommended previously. Residual CAD was noted with plan for medical treatment.  Transthoracic echo 01/20/2022 with EF 35 to 40%, hesitant, noncompliant with GDMT per cardiology note.  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, without long-term current use of insulin (HCC) 11/16/2016   Chronic systolic congestive heart failure (HCC) 11/16/2016   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/2016   Noncompliance 11/16/2016   Old MI (myocardial infarction) 11/16/2016   Pure hypercholesterolemia 11/16/2016   History of fasciotomy 06/07/2016   Status post coronary artery stent placement    Acute combined systolic and diastolic heart failure (HCC)    Abnormal CXR    Abdominal pain    Chest pain    NSTEMI (non-ST elevated myocardial infarction) (HCC)    Fever    Thromboembolism (HCC)    Vascular occlusion    Pain    Anterior subendocardial MI (HCC) 02/21/2016   Right leg pain 02/21/2016   Non-STEMI (non-ST elevated myocardial infarction) (HCC) 02/21/2016   LV (left ventricular) mural thrombus 02/21/2016   Elevated troponin 02/21/2016   Abnormal EKG 02/21/2016   Acute MI (HCC) 02/21/2016   History of embolectomy 02/21/2016   Past Medical History:  Diagnosis Date   Diabetes (HCC)  Hypercholesteremia    LV (left ventricular) mural thrombus    NSTEMI (non-ST elevated myocardial infarction) [facility]    Past Surgical History:  Procedure Laterality Date   APPLICATION OF WOUND VAC Right 02/22/2016   Procedure: APPLICATION OF Negative Pressure Dressing Right Lower Leg;  Surgeon: Fransisco Hertz, MD;  [facility];  Service: Vascular;  Laterality: Right;   APPLICATION OF WOUND VAC Right 02/26/2016   Procedure: APPLICATION OF WOUND VAC, RIGHT MEDIAL LOWER  LEG FASCIOTOMY SITE;  Surgeon: Larina Earthly, MD;  Location: [facility] OR;  Service: Vascular;  Laterality: Right;   CARDIAC CATHETERIZATION N/A 03/09/2016   Procedure: Coronary Stent Intervention;  Surgeon: Iran Ouch, MD;  Location: [facility] INVASIVE CV LAB;  Service: Cardiovascular;  Laterality: N/A;  distal RCA mid LAD   CARDIAC CATHETERIZATION N/A 03/09/2016   Procedure: Intravascular Pressure Wire/FFR Study;  Surgeon: Iran Ouch, MD;  Location: [facility] INVASIVE CV LAB;  Service: Cardiovascular;  Laterality: N/A;  mid LAD   CARDIAC CATHETERIZATION N/A 03/09/2016   Procedure: Left Heart Cath and Coronary Angiography;  Surgeon: Iran Ouch, MD;  Location: [facility] INVASIVE CV LAB;  Service: Cardiovascular;  Laterality: N/A;   EMBOLECTOMY Right 02/21/2016   Procedure: EMBOLECTOMY RIGHT COMMON FEMORAL ARTERY; FOUR COMPARTMENT FASCIOTOMY;  Surgeon: Fransisco Hertz, MD;  Location: [facility] OR;  Service: Vascular;  Laterality: Right;   FASCIOTOMY CLOSURE Right 02/26/2016   Procedure: RIGHT LATERAL LOWER LEG FASCIOTOMY CLOSURE;  Surgeon: Larina Earthly, MD;  Location: [facility] OR;  Service: Vascular;  Laterality: Right;   HEMATOMA EVACUATION Right 02/22/2016   Procedure: EVACUATION HEMATOMA With Placement of Negative Pressure Dressing;  Surgeon: Fransisco Hertz, MD;  Location: [facility] OR;  Service: Vascular;  Laterality: Right;   Allergies  Allergen Reactions   Metformin     Other reaction(s): Dizziness (intolerance)   Prior to Admission medications   Medication Sig Start Date End Date Taking? Authorizing Provider  aspirin EC 81 MG tablet Take 1 tablet (81 mg total) by mouth daily.  Swallow whole. 11/09/22  Yes Shade Flood, MD  atorvastatin (LIPITOR) 20 MG tablet Take 1 tablet (20 mg total) by mouth daily. 03/23/23  Yes Shade Flood, MD  fluticasone (FLONASE) 50 MCG/ACT nasal spray Place 1 spray into both nostrils daily. 07/25/22  Yes Shade Flood, MD  glucose blood (TRUE METRIX BLOOD GLUCOSE TEST) test strip Use 1  strip to check blood sugar 2 times daily 01/11/22  Yes Shade Flood, MD  Latanoprostene Bunod (VYZULTA) 0.024 % SOLN Place 1 drop into both eyes at night as directed 11/15/22  Yes   Semaglutide, 2 MG/DOSE, (OZEMPIC, 2 MG/DOSE,) 8 MG/3ML SOPN Inject 2 mg as directed once a week. 05/04/23  Yes Shade Flood, MD  timolol (TIMOPTIC) 0.5 % ophthalmic solution Place 1 drop into both eyes in the morning as directed 11/15/22  Yes    Social History   Socioeconomic History   Marital status: Married    Spouse name: [name]   Number of children: 5   Years of education: COLLEGE   Highest education level: Not on file  Occupational History   Occupation: UNEMPLOYED  Tobacco Use   Smoking status: Never   Smokeless tobacco: Never  Vaping Use   Vaping status: Never Used  Substance and Sexual Activity   Alcohol use: No   Drug use: No   Sexual activity: Not on file  Other Topics Concern   Not on file  Social History Narrative  Not on file   Social Determinants of Health   Financial Resource Strain: Not on file  Food Insecurity: Not on file  Transportation Needs: Not on file  Physical Activity: Not on file  Stress: Not on file  Social Connections: Not on file  Intimate Partner Violence: Not on file    Review of Systems   Objective:   Vitals:   07/03/23 1454  BP: 136/84  Pulse: 88  Temp: 98.6 F (37 C)  TempSrc: Temporal  SpO2: 99%  Weight: 207 lb 12.8 oz (94.3 kg)  Height: 5\' 8"  (1.727 m)     Physical Exam Vitals reviewed.  Constitutional:      Appearance: He is well-developed.  HENT:     Head: Normocephalic and atraumatic.  Neck:     Vascular: No carotid bruit or JVD.  Cardiovascular:     Rate and Rhythm: Normal rate and regular rhythm.     Heart sounds: Normal heart sounds. No murmur heard. Pulmonary:     Effort: Pulmonary effort is normal.     Breath sounds: Normal breath sounds. No rales.  Abdominal:     General: Abdomen is flat. Bowel sounds are normal. There  is no distension.     Tenderness: There is no abdominal tenderness. There is no guarding or rebound.     Comments: Abdomen nontender, unable to reproduce pain with abdominal or chest wall exam.  Musculoskeletal:     Right lower leg: No edema.     Left lower leg: No edema.  Skin:    General: Skin is warm and dry.  Neurological:     Mental Status: He is alert and oriented to person, place, and time.  Psychiatric:        Mood and Affect: Mood normal.      EKG, sinus rhythm, rate 89.  Anterolateral infarct, inferior infarct noted, age undetermined.  No apparent significant changes compared to 10/20/2021 EKG.  3:56 PM Patient vomiting in restroom.   Assessment & Plan:  Troy Yu is a 62 y.o. male . Chest pain, unspecified type - Plan: EKG 12-Lead  Upper abdominal pain - Plan: EKG 12-Lead  Type 2 diabetes mellitus with hyperglycemia, without long-term current use of insulin (HCC) - Plan: EKG 12-Lead  Vomiting, unspecified vomiting type, unspecified whether nausea present  Lower chest, abdominal pain since late last night, early this morning.  Persistent.  Unable to reproduce on exam.  Initially denied nausea or vomiting, but 1 episode of emesis in office - felt some improvement in discomfort. EKG with inferior infarct, anterolateral infarct, age undetermined but appears similar to previous EKG with known history of MI. Differential includes GI source, gallbladder, gastritis, less likely obstruction. However given his history of CAD, diabetes, concern for possible anginal equivalent.  No apparent acute EKG changes but did recommend emergency room evaluation.  EMS transport discussed, declined and chose to drive by private vehicle.  911 precautions given.   No orders of the defined types were placed in this encounter.  Patient Instructions  Thank you for coming to see me today.  Unfortunately I am concerned about your chest and upper abdominal symptoms, especially with your  heart history.  You do need to be seen in the emergency room after leaving the office.  As you have chosen to drive by personal vehicle instead of paramedic, pull over and call 911 if any worsening or changing symptoms as we discussed.  I will watch for the report from the emergency room  and can follow-up afterwards in the next week or 2.  We can discuss your chronic medications and medicines further at that time.  Hang in there!  Here is the location of the nearest emergency room as requested, but as we discussed if they feel like you need further evaluation or hospitalization you may be transported by ambulance to the hospital:   [facility] Address: [address], [place], [place] [address] Phone: [phone]         Signed,   Meredith Staggers, MD [facility], [facility] 07/03/23 3:56 PM

## 2023-07-03 NOTE — Discharge Instructions (Signed)
You were seen in the emergency room for abdominal pain Your blood work including cardiac enzymes looked okay Your chest x-ray did not show any pneumonia or other concerning finding Your EKG looked normal as well Your symptoms were relieved after a medication used to treat stomach acid Your symptoms may be due to stomach acid and you should talk to your primary care doctor about potentially starting a medication to help with acid reflux Return to the Emergency Department for chest pain trouble breathing any other concerns

## 2023-07-03 NOTE — ED Notes (Signed)
Patient reports he would like to give a stool sample because he believes he may have eaten something that "had toxins in it". Provider informed.

## 2023-07-03 NOTE — Telephone Encounter (Signed)
Patient Name First: Troy Last: AL THER TH Yu Gender: Male DOB: 1961/05/22 Age: 62 Y 11 M 30 D Return Phone Number: [phone] (Primary) Address: City/ State/ Zip: [place]  [address] Client [facility] - Cli Client Site [facility] - Day Provider Meredith Staggers- MD Contact Type Call Who Is Calling Patient / Member / Family / Caregiver Call Type Triage / Clinical Relationship To Patient Self Return Phone Number [phone] (Primary) Chief Complaint CHEST PAIN - pain, pressure, heaviness or tightness Reason for Call Symptomatic / Request for Health Information Initial Comment Caller is [name] with Office Transferring call Patient having pain under his rib cage but refuses to go to ER. Translation No Nurse Assessment Nurse: [name], RN, [name] Date/Time (Eastern Time): 07/03/2023 11:40:30 AM Confirm and document reason for call. If symptomatic, describe symptoms. ---Pt has CP and SOB. Does the patient have any new or worsening symptoms? ---Yes Will a triage be completed? ---Yes Related visit to physician within the last 2 weeks? ---No Does the PT have any chronic conditions? (i.e. diabetes, asthma, this includes High risk factors for pregnancy, etc.) ---No Is this a behavioral health or substance abuse call? ---No Guidelines Guideline Title Affirmed Question Affirmed Notes Nurse Date/Time [name] Chest Pain [1] Chest pain lasts > 5 minutes AND [2] age > 62 [name], RN, [name] 07/03/2023 11:41:34 AM Disp. Time [name] Disposition Final User 07/03/2023 11:35:59 AM Send to Urgent [name], [name] 07/03/2023 11:43:50 AM Call EMS 911 Now Yes [name], RN, [name] 07/03/2023 11:44:39 AM 911 Outcome Documentation [name], RN, [name] PLEASE NOTE: All timestamps contained within this report are represented as [place] Standard Time. CONFIDENTIALTY NOTICE: This fax transmission is intended  only for the addressee. It contains information that is legally privileged, confidential or otherwise protected from use or disclosure. If you are not the intended recipient, you are strictly prohibited from reviewing, disclosing, copying using or disseminating any of this information or taking any action in reliance on or regarding this information. If you have received this fax in error, please notify us immediately by telephone so that we can arrange for its return to [place]. Phone: [phone], Toll-Free: [phone], Fax: [phone] Page: 2 of 2 Call Id: [id number] Disp. Time (Eastern Time) Disposition Final User Reason: Pt refused to call 911 and go to the ER. Trans to [facility] per insistence Final Disposition 07/03/2023 11:43:50 AM Call EMS 911 Now Yes [name], RN, [name] Disagree/Comply Disagree Caller Understands Yes PreDisposition Call Doctor Care Advice Given Per Guideline CALL EMS 911 NOW: CARE ADVICE given per Chest Pain (Adult) guideline. Referrals GO TO FACILITY REFUSED

## 2023-07-04 ENCOUNTER — Telehealth: Payer: Self-pay

## 2023-07-04 ENCOUNTER — Encounter: Payer: Self-pay | Admitting: Family Medicine

## 2023-07-04 NOTE — Transitions of Care (Post Inpatient/ED Visit) (Signed)
   07/04/2023  Name: Troy Yu MRN: [id number] DOB: February 07, 1961  Today's TOC FU Call Status: Today's TOC FU Call Status:: Unsuccessful Call (1st Attempt) Unsuccessful Call (1st Attempt) Date: 07/04/23  Attempted to reach the patient regarding the most recent Inpatient/ED visit.  Follow Up Plan: Additional outreach attempts will be made to reach the patient to complete the Transitions of Care (Post Inpatient/ED visit) call.   [employer], [place]

## 2023-07-18 ENCOUNTER — Other Ambulatory Visit (HOSPITAL_COMMUNITY): Payer: Self-pay

## 2023-08-05 ENCOUNTER — Other Ambulatory Visit (HOSPITAL_COMMUNITY): Payer: Self-pay

## 2023-08-12 ENCOUNTER — Other Ambulatory Visit (HOSPITAL_COMMUNITY): Payer: Self-pay

## 2023-08-31 ENCOUNTER — Other Ambulatory Visit (HOSPITAL_COMMUNITY): Payer: Self-pay

## 2023-09-21 LAB — HM DIABETES EYE EXAM

## 2023-09-25 ENCOUNTER — Encounter: Payer: Self-pay | Admitting: Family Medicine

## 2023-10-09 ENCOUNTER — Other Ambulatory Visit (HOSPITAL_COMMUNITY): Payer: Self-pay

## 2023-10-09 ENCOUNTER — Other Ambulatory Visit: Payer: Self-pay | Admitting: Family Medicine

## 2023-10-09 ENCOUNTER — Other Ambulatory Visit: Payer: Self-pay

## 2023-10-09 DIAGNOSIS — E785 Hyperlipidemia, unspecified: Secondary | ICD-10-CM

## 2023-10-09 MED ORDER — ATORVASTATIN CALCIUM 20 MG PO TABS
20.0000 mg | ORAL_TABLET | Freq: Every day | ORAL | 1 refills | Status: DC
  Filled 2023-10-09: qty 90, 90d supply, fill #0
  Filled 2024-01-06: qty 90, 90d supply, fill #1

## 2023-10-31 ENCOUNTER — Other Ambulatory Visit: Payer: Self-pay | Admitting: Family Medicine

## 2023-10-31 ENCOUNTER — Other Ambulatory Visit (HOSPITAL_COMMUNITY): Payer: Self-pay

## 2023-10-31 ENCOUNTER — Other Ambulatory Visit: Payer: Self-pay

## 2023-10-31 DIAGNOSIS — E1165 Type 2 diabetes mellitus with hyperglycemia: Secondary | ICD-10-CM

## 2023-10-31 MED ORDER — OZEMPIC (2 MG/DOSE) 8 MG/3ML ~~LOC~~ SOPN
2.0000 mg | PEN_INJECTOR | SUBCUTANEOUS | 1 refills | Status: DC
  Filled 2023-10-31 – 2023-12-02 (×4): qty 3, 28d supply, fill #0
  Filled 2023-12-02 – 2024-01-06 (×2): qty 3, 28d supply, fill #1
  Filled 2024-02-01: qty 3, 28d supply, fill #2
  Filled 2024-03-09 – 2024-03-19 (×3): qty 3, 28d supply, fill #3
  Filled 2024-04-16: qty 3, 28d supply, fill #4

## 2023-11-01 ENCOUNTER — Other Ambulatory Visit (HOSPITAL_BASED_OUTPATIENT_CLINIC_OR_DEPARTMENT_OTHER): Payer: Self-pay

## 2023-11-03 ENCOUNTER — Other Ambulatory Visit (HOSPITAL_COMMUNITY): Payer: Self-pay

## 2023-11-03 MED ORDER — TIMOLOL MALEATE 0.5 % OP SOLN
1.0000 [drp] | Freq: Every morning | OPHTHALMIC | 1 refills | Status: DC
  Filled 2023-11-03: qty 5, 50d supply, fill #0
  Filled 2023-11-13: qty 5, 100d supply, fill #0
  Filled 2023-12-02: qty 5, 30d supply, fill #0
  Filled 2024-01-06: qty 5, 50d supply, fill #1
  Filled 2024-01-10 (×2): qty 5, 30d supply, fill #1

## 2023-11-06 ENCOUNTER — Other Ambulatory Visit (HOSPITAL_COMMUNITY): Payer: Self-pay

## 2023-11-06 ENCOUNTER — Encounter (HOSPITAL_COMMUNITY): Payer: Self-pay

## 2023-11-13 ENCOUNTER — Other Ambulatory Visit (HOSPITAL_COMMUNITY): Payer: Self-pay

## 2023-11-22 ENCOUNTER — Encounter: Payer: Self-pay | Admitting: Family Medicine

## 2023-12-02 ENCOUNTER — Other Ambulatory Visit (HOSPITAL_COMMUNITY): Payer: Self-pay

## 2024-01-06 ENCOUNTER — Other Ambulatory Visit (HOSPITAL_COMMUNITY): Payer: Self-pay

## 2024-01-09 ENCOUNTER — Other Ambulatory Visit (HOSPITAL_COMMUNITY): Payer: Self-pay

## 2024-01-09 MED ORDER — LATANOPROSTENE BUNOD 0.024 % OP SOLN
1.0000 [drp] | Freq: Every day | OPHTHALMIC | 0 refills | Status: DC
  Filled 2024-01-09 (×2): qty 2.5, 25d supply, fill #0

## 2024-01-10 ENCOUNTER — Other Ambulatory Visit (HOSPITAL_COMMUNITY): Payer: Self-pay

## 2024-01-10 ENCOUNTER — Other Ambulatory Visit: Payer: Self-pay

## 2024-02-01 ENCOUNTER — Other Ambulatory Visit (HOSPITAL_COMMUNITY): Payer: Self-pay

## 2024-02-02 ENCOUNTER — Other Ambulatory Visit (HOSPITAL_COMMUNITY): Payer: Self-pay

## 2024-02-02 ENCOUNTER — Encounter: Payer: Self-pay | Admitting: Pharmacist

## 2024-02-02 ENCOUNTER — Other Ambulatory Visit (HOSPITAL_BASED_OUTPATIENT_CLINIC_OR_DEPARTMENT_OTHER): Payer: Self-pay

## 2024-02-02 ENCOUNTER — Other Ambulatory Visit: Payer: Self-pay

## 2024-02-02 MED ORDER — VYZULTA 0.024 % OP SOLN
Freq: Every evening | OPHTHALMIC | 5 refills | Status: DC
  Filled 2024-02-02: qty 2.5, 25d supply, fill #0
  Filled 2024-02-14: qty 2.5, 30d supply, fill #0
  Filled 2024-03-09: qty 2.5, 30d supply, fill #1
  Filled 2024-04-16: qty 2.5, 30d supply, fill #2
  Filled 2024-05-06: qty 2.5, 30d supply, fill #3
  Filled 2024-06-07: qty 2.5, 25d supply, fill #3
  Filled 2024-07-03: qty 2.5, 25d supply, fill #4
  Filled 2024-07-29: qty 2.5, 25d supply, fill #5

## 2024-02-02 MED ORDER — TIMOLOL HEMIHYDRATE 0.5 % OP SOLN
1.0000 [drp] | Freq: Every morning | OPHTHALMIC | 2 refills | Status: AC
  Filled 2024-02-02: qty 5, 50d supply, fill #0
  Filled 2024-02-14: qty 5, 30d supply, fill #0
  Filled 2024-05-06 – 2024-06-07 (×2): qty 5, 50d supply, fill #0
  Filled 2024-07-03: qty 5, 30d supply, fill #0
  Filled 2024-07-29: qty 5, 90d supply, fill #0
  Filled 2024-08-26: qty 5, 50d supply, fill #0

## 2024-02-14 ENCOUNTER — Other Ambulatory Visit: Payer: Self-pay

## 2024-02-14 ENCOUNTER — Other Ambulatory Visit (HOSPITAL_COMMUNITY): Payer: Self-pay

## 2024-02-15 ENCOUNTER — Other Ambulatory Visit (HOSPITAL_COMMUNITY): Payer: Self-pay

## 2024-02-22 ENCOUNTER — Other Ambulatory Visit (HOSPITAL_COMMUNITY): Payer: Self-pay

## 2024-02-22 MED ORDER — TIMOLOL MALEATE 0.5 % OP SOLN
1.0000 [drp] | Freq: Every day | OPHTHALMIC | 2 refills | Status: DC
  Filled 2024-02-22: qty 5, 30d supply, fill #0
  Filled 2024-02-22: qty 5, 50d supply, fill #0
  Filled 2024-03-09 – 2024-03-19 (×2): qty 5, 30d supply, fill #1
  Filled 2024-07-03: qty 5, 30d supply, fill #2

## 2024-03-09 ENCOUNTER — Other Ambulatory Visit (HOSPITAL_COMMUNITY): Payer: Self-pay

## 2024-03-11 ENCOUNTER — Other Ambulatory Visit: Payer: Self-pay

## 2024-03-19 ENCOUNTER — Other Ambulatory Visit (HOSPITAL_COMMUNITY): Payer: Self-pay

## 2024-04-16 ENCOUNTER — Other Ambulatory Visit (HOSPITAL_COMMUNITY): Payer: Self-pay

## 2024-04-16 ENCOUNTER — Other Ambulatory Visit: Payer: Self-pay | Admitting: Family Medicine

## 2024-04-16 ENCOUNTER — Encounter (HOSPITAL_COMMUNITY): Payer: Self-pay

## 2024-04-16 ENCOUNTER — Other Ambulatory Visit: Payer: Self-pay

## 2024-04-16 DIAGNOSIS — E785 Hyperlipidemia, unspecified: Secondary | ICD-10-CM

## 2024-04-17 ENCOUNTER — Other Ambulatory Visit (HOSPITAL_COMMUNITY): Payer: Self-pay

## 2024-04-18 ENCOUNTER — Other Ambulatory Visit (HOSPITAL_COMMUNITY): Payer: Self-pay

## 2024-04-18 ENCOUNTER — Other Ambulatory Visit (HOSPITAL_BASED_OUTPATIENT_CLINIC_OR_DEPARTMENT_OTHER): Payer: Self-pay

## 2024-05-06 ENCOUNTER — Other Ambulatory Visit: Payer: Self-pay | Admitting: Family Medicine

## 2024-05-06 ENCOUNTER — Other Ambulatory Visit (HOSPITAL_COMMUNITY): Payer: Self-pay

## 2024-05-06 DIAGNOSIS — E785 Hyperlipidemia, unspecified: Secondary | ICD-10-CM

## 2024-05-06 DIAGNOSIS — E1165 Type 2 diabetes mellitus with hyperglycemia: Secondary | ICD-10-CM

## 2024-05-07 ENCOUNTER — Other Ambulatory Visit (HOSPITAL_COMMUNITY): Payer: Self-pay

## 2024-05-07 ENCOUNTER — Other Ambulatory Visit: Payer: Self-pay

## 2024-05-07 MED ORDER — OZEMPIC (2 MG/DOSE) 8 MG/3ML ~~LOC~~ SOPN
2.0000 mg | PEN_INJECTOR | SUBCUTANEOUS | 1 refills | Status: AC
  Filled 2024-05-07: qty 3, 28d supply, fill #0
  Filled 2024-06-07: qty 3, 28d supply, fill #1
  Filled 2024-07-03: qty 3, 28d supply, fill #2
  Filled 2024-07-29: qty 3, 28d supply, fill #3
  Filled 2024-08-27 – 2024-09-02 (×3): qty 3, 28d supply, fill #4
  Filled 2024-10-01 – 2024-10-02 (×3): qty 3, 28d supply, fill #5

## 2024-05-07 MED ORDER — ATORVASTATIN CALCIUM 20 MG PO TABS
20.0000 mg | ORAL_TABLET | Freq: Every day | ORAL | 1 refills | Status: DC
  Filled 2024-05-07: qty 90, 90d supply, fill #0
  Filled 2024-07-29: qty 90, 90d supply, fill #1

## 2024-05-08 ENCOUNTER — Other Ambulatory Visit (HOSPITAL_COMMUNITY): Payer: Self-pay

## 2024-06-03 ENCOUNTER — Encounter: Payer: Self-pay | Admitting: Family Medicine

## 2024-06-03 ENCOUNTER — Ambulatory Visit: Admitting: Family Medicine

## 2024-06-03 VITALS — BP 100/60 | HR 85 | Temp 98.0°F | Resp 22 | Ht 68.0 in | Wt 202.6 lb

## 2024-06-03 DIAGNOSIS — Z7985 Long-term (current) use of injectable non-insulin antidiabetic drugs: Secondary | ICD-10-CM

## 2024-06-03 DIAGNOSIS — K219 Gastro-esophageal reflux disease without esophagitis: Secondary | ICD-10-CM

## 2024-06-03 DIAGNOSIS — E785 Hyperlipidemia, unspecified: Secondary | ICD-10-CM | POA: Diagnosis not present

## 2024-06-03 DIAGNOSIS — E1165 Type 2 diabetes mellitus with hyperglycemia: Secondary | ICD-10-CM | POA: Diagnosis not present

## 2024-06-03 DIAGNOSIS — M549 Dorsalgia, unspecified: Secondary | ICD-10-CM

## 2024-06-03 NOTE — Patient Instructions (Addendum)
 See foods below that can cause heartburn. You can try over the counter pepcid twice per day initially to see if effective. If not, we can discuss other medicine at follow up.   No change in diabetes meds or cholesterol med for now.  I will check your labs today and we can discuss those results further at follow-up in a month if needed but we will also contact you through MyChart regarding those results soon.  Occasional shock sensation in the upper back could be from a pinched nerve in the neck.  Your exam is reassuring today.  Try gentle range of motion, gentle stretches throughout the day, especially after warm shower to see if that loosens up some of the muscles and lessens how often the symptoms occur.  We will recheck this again in 1 month.  Thank you for coming in today and take care.   Return to the clinic or go to the nearest emergency room if any of your symptoms worsen or new symptoms occur.    GERD in Adults: Diet Changes When you have gastroesophageal reflux disease (GERD), you may need to make changes to your diet. Choosing the right foods can help with your symptoms. Think about working with an expert in healthy eating called a dietitian. They can help you make healthy food choices. What are tips for following this plan? Reading food labels Look for foods that are low in saturated fat. Foods that may help with your symptoms include: Foods with less than 5% of daily value (DV) of fat. Foods with 0 grams of trans fat. Cooking [employer] in ways that don't use a lot of fat. These ways include: Baking. Steaming. Grilling. Broiling. To add flavor, try to use herbs that are low in spice and acidity. Avoid frying your food. Meal planning  Eat small meals often rather than eating 3 large meals each day. Eat your meals slowly in a place where you feel relaxed. If told by your health care provider, avoid: Foods that cause symptoms. Keep a food diary to keep track of foods that  cause symptoms. Alcohol. Drinking a lot of liquid with meals. General instructions For 2-3 hours after you eat, avoid: Bending over. Exercise. Lying down. Chew sugar-free gum after meals. What foods should I eat? Eat a healthy diet. Try to include: Foods with high amounts of fiber. These include: Fruits and vegetables. Whole grains and beans. Low-fat dairy products. Lean meats, fish, and poultry. Egg whites. Foods that cause symptoms in someone else may not cause symptoms for you. Work with your provider to find foods that are safe for you. The items listed above may not be all the foods and drinks you can have. Talk with a dietitian to learn more. The items listed above may not be a complete list of foods and beverages you can eat and drink. Contact a dietitian for more information. What foods should I avoid? Limiting some of these foods may help with your symptoms. Each person is different. Talk with a dietitian or your provider to help you find the exact foods to avoid. Some of the foods to avoid may include: Fruits Fruits with a lot of acid in them. These may include citrus fruits, such as oranges, grapefruit, pineapple, and lemons. Vegetables Deep-fried vegetables, such as [place] fries. Vegetables, sauces, or toppings made with added fat and vegetables with acid in them. These may include tomatoes and tomato products, chili peppers, onions, garlic, and horseradish. Grains Pastries or quick breads  with added fat. Meats and other proteins High-fat meats, such as fatty beef or pork, hot dogs, ribs, ham, sausage, salami, and bacon. Fried meat or protein, such as fried fish and fried chicken. Egg yolks. Fats and oils Butter. Margarine. Shortening. Ghee. Drinks Coffee and other drinks with caffeine in them. Fizzy and sugary drinks, such as soda and energy drinks. Fruit juice made with acidic fruits, such as orange or grapefruit. Tomato juice. Sweets and desserts Chocolate and  cocoa. Donuts. Seasonings and condiments Mint, such as peppermint and spearmint. Condiments, herbs, or seasonings that cause symptoms. These may include curry, hot sauce, or vinegar-based salad dressings. The items listed above may not be all the foods and drinks you should avoid. Talk with a dietitian to learn more. Questions to ask your health care provider Changes to your diet and everyday life are often the first steps taken to manage symptoms of GERD. If these changes don't help, talk with your provider about taking medicines. Where to find more information International [employer] for Gastrointestinal Disorders: aboutgerd.org This information is not intended to replace advice given to you by your health care provider. Make sure you discuss any questions you have with your health care provider. Document Revised: 06/27/2023 Document Reviewed: 01/11/2023 Elsevier Patient Education  2024 [employer].

## 2024-06-03 NOTE — Progress Notes (Unsigned)
 Subjective:  Patient ID: Troy Yu, male    DOB: [birth year]  Age: 63 y.o. MRN: [id number]  CC:  Chief Complaint  Patient presents with   Diabetes   Gastroesophageal Reflux    Wakes up and has a acid taste in his mouth   back Spasms    Patient has is having tingling on the top of his back/bottom of his neck for some time now.     HPI Troy Yu presents for  Chronic condition follow-up and other acute issues as above. Unfortunately not seen him since November 2024 for acute symptoms at that time.  We last discussed his diabetes in July 2024. Denies any barriers to care or other medical care - just did not have appointment.   Diabetes: As above, delay in follow-up, testing.  Last A1c did look better in July 2024.  Diabetes is complicated by hyperglycemia, microalbuminuria and retinopathy along with ischemic cardiomyopathy followed by cardiology.  Treated with Ozempic  with increasing dosages up to 2 mg weekly as of his July 2024 visit.  Denies side effects at that time including any nausea vomiting or abdominal pain.  He was on statin with Lipitor  20 mg daily but there was some question regarding compliance based on previous cardiology visit. He is on medical treatment for residual CAD with aspirin .  No new chest pains.  Home readings: 2 weeks ago around 160.  No n/v/abd pain. Acid reflux symptoms only No lows.  Still taking ozempic  2mg  weekly.  Out of aspirin  for a few days. Prior took daily.   Microalbumin: Due  Lab Results  Component Value Date   HGBA1C 6.9 (H) 03/27/2023   HGBA1C 8.5 (H) 08/31/2022   HGBA1C 7.1 (A) 04/28/2022   Lab Results  Component Value Date   LDLCALC 78 08/31/2022   CREATININE 0.89 07/03/2023   Hyperlipidemia: Lipitor  20 mg daily, due for repeat labs.  Denies any new side effects. Off meds temporarily for a few months, but now back on daily past month.   Lab Results  Component Value Date   CHOL 147 08/31/2022   HDL 45.00  08/31/2022   LDLCALC 78 08/31/2022   TRIG 122.0 08/31/2022   CHOLHDL 3 08/31/2022   Lab Results  Component Value Date   ALT 26 03/27/2023   AST 19 03/27/2023   ALKPHOS 64 03/27/2023   BILITOT 0.6 03/27/2023   Diabetic Foot Exam - Simple   Simple Foot Form Visual Inspection See comments: Yes Sensation Testing Intact to touch and monofilament testing bilaterally: Yes Pulse Check Posterior Tibialis and Dorsalis pulse intact bilaterally: Yes Comments Some toenail deformities on left foot      GERD As above, has noted acid taste in mouth with waking up at times at night.  Some heartburn during the day with certain foods. No current meds.   Upper back pain upper back/neck at times with some tingling. Feels like a shock sensation - infrequent. Once every few months. No neck pain.    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, without long-term current  use of insulin  (HCC) 11/16/2016   Chronic systolic congestive heart failure (HCC) 11/16/2016   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/2016   Noncompliance 11/16/2016   Old MI (myocardial infarction) 11/16/2016   Pure hypercholesterolemia 11/16/2016   History of fasciotomy 06/07/2016   Status post coronary artery stent placement    Acute combined systolic and diastolic heart failure (HCC)    Abnormal CXR    Abdominal pain    Chest pain    NSTEMI (non-ST elevated myocardial infarction) (HCC)    Fever    Thromboembolism (HCC)    Vascular occlusion     Pain    Anterior subendocardial MI (HCC) 02/21/2016   Right leg pain 02/21/2016   Non-STEMI (non-ST elevated myocardial infarction) (HCC) 02/21/2016   LV (left ventricular) mural thrombus 02/21/2016   Elevated troponin 02/21/2016   Abnormal EKG 02/21/2016   Acute MI (HCC) 02/21/2016   History of embolectomy 02/21/2016   Past Medical History:  Diagnosis Date   Diabetes (HCC)    Hypercholesteremia    LV (left ventricular) mural thrombus    NSTEMI (non-ST elevated myocardial infarction) [facility])    Past Surgical History:  Procedure Laterality Date   APPLICATION OF WOUND VAC Right 02/22/2016   Procedure: APPLICATION OF Negative Pressure Dressing Right Lower Leg;  Surgeon: Redell LITTIE Door, MD;  Location: [facility] OR;  Service: Vascular;  Laterality: Right;   APPLICATION OF WOUND VAC Right 02/26/2016   Procedure: APPLICATION OF WOUND VAC, RIGHT MEDIAL LOWER LEG FASCIOTOMY SITE;  Surgeon: Krystal JULIANNA Doing, MD;  Location: [facility] OR;  Service: Vascular;  Laterality: Right;   CARDIAC CATHETERIZATION N/A 03/09/2016   Procedure: Coronary Stent Intervention;  Surgeon: Deatrice DELENA Cage, MD;  Location: [facility] INVASIVE CV LAB;  Service: Cardiovascular;  Laterality: N/A;  distal RCA mid LAD   CARDIAC CATHETERIZATION N/A 03/09/2016   Procedure: Intravascular Pressure Wire/FFR Study;  Surgeon: Deatrice DELENA Cage, MD;  Location: [facility] INVASIVE CV LAB;  Service: Cardiovascular;  Laterality: N/A;  mid LAD   CARDIAC CATHETERIZATION N/A 03/09/2016   Procedure: Left Heart Cath and Coronary Angiography;  Surgeon: Deatrice DELENA Cage, MD;  Location: [facility] INVASIVE CV LAB;  Service: Cardiovascular;  Laterality: N/A;   EMBOLECTOMY Right 02/21/2016   Procedure: EMBOLECTOMY RIGHT COMMON FEMORAL ARTERY; FOUR COMPARTMENT FASCIOTOMY;  Surgeon: Redell LITTIE Door, MD;  Location: [facility] OR;  Service: Vascular;  Laterality: Right;   FASCIOTOMY CLOSURE Right 02/26/2016   Procedure: RIGHT LATERAL LOWER LEG FASCIOTOMY CLOSURE;  Surgeon: Krystal JULIANNA Doing, MD;  Location: [facility] OR;   Service: Vascular;  Laterality: Right;   HEMATOMA EVACUATION Right 02/22/2016   Procedure: EVACUATION HEMATOMA With Placement of Negative Pressure Dressing;  Surgeon: Redell LITTIE Door, MD;  Location: [facility] OR;  Service: Vascular;  Laterality: Right;   Allergies  Allergen Reactions   Metformin     Other reaction(s): Dizziness (intolerance)   Prior to Admission medications   Medication Sig Start Date End Date Taking? Authorizing Provider  atorvastatin  (LIPITOR ) 20 MG tablet Take 1 tablet (20 mg total) by mouth daily. 05/07/24  Yes Levora Reyes SAUNDERS, MD  fluticasone  (FLONASE ) 50 MCG/ACT nasal spray Place 1 spray into both nostrils daily. 07/25/22  Yes Levora Reyes SAUNDERS, MD  glucose blood (TRUE METRIX BLOOD GLUCOSE TEST) test strip Use 1 strip to check blood sugar 2 times daily 01/11/22  Yes Levora Reyes SAUNDERS, MD  Latanoprostene Bunod  (VYZULTA ) 0.024 % SOLN Instill 1 drop into both eyes every evening as directed 02/01/24  Yes  Semaglutide , 2 MG/DOSE, (OZEMPIC , 2 MG/DOSE,) 8 MG/3ML SOPN Inject 2 mg as directed once a week. 05/07/24  Yes Levora Reyes SAUNDERS, MD  timolol  (BETIMOL ) 0.5 % ophthalmic solution Place 1 drop into both eyes every morning as directed. 02/01/24  Yes   timolol  (TIMOPTIC ) 0.5 % ophthalmic solution Place 1 drop into both eyes daily. 02/22/24  Yes   aspirin  EC 81 MG tablet Take 1 tablet (81 mg total) by mouth daily.  Swallow whole. Patient not taking: Reported on 06/03/2024 11/09/22   Levora Reyes SAUNDERS, MD   Social History   Socioeconomic History   Marital status: Married    Spouse name: [name]   Number of children: 5   Years of education: COLLEGE   Highest education level: Not on file  Occupational History   Occupation: UNEMPLOYED  Tobacco Use   Smoking status: Never   Smokeless tobacco: Never  Vaping Use   Vaping status: Never Used  Substance and Sexual Activity   Alcohol use: No   Drug use: No   Sexual activity: Not on file  Other Topics Concern   Not on file  Social History  Narrative   Not on file   Social Drivers of Health   Financial Resource Strain: Not on file  Food Insecurity: Not on file  Transportation Needs: Not on file  Physical Activity: Not on file  Stress: Not on file  Social Connections: Not on file  Intimate Partner Violence: Not on file    Review of Systems Per HPI.   Objective:   Vitals:   06/03/24 1546  BP: 100/60  Pulse: 85  Resp: (!) 22  Temp: 98 F (36.7 C)  TempSrc: Temporal  SpO2: 99%  Weight: 202 lb 9.6 oz (91.9 kg)  Height: 5' 8 (1.727 m)     Physical Exam Vitals reviewed.  Constitutional:      Appearance: He is well-developed.  HENT:     Head: Normocephalic and atraumatic.  Neck:     Vascular: No carotid bruit or JVD.  Cardiovascular:     Rate and Rhythm: Normal rate and regular rhythm.     Heart sounds: Normal heart sounds. No murmur heard. Pulmonary:     Effort: Pulmonary effort is normal.     Breath sounds: Normal breath sounds. No rales.  Musculoskeletal:     Right lower leg: No edema.     Left lower leg: No edema.     Comments: Slight decreased cervical rotation, lateral flexion, flexion. No radicular sx's with exam. No midline cervical bony ttp. Area of intermittent discomfort at upper trapezius to rhomboid on R.   Skin:    General: Skin is warm and dry.  Neurological:     Mental Status: He is alert and oriented to person, place, and time.  Psychiatric:        Mood and Affect: Mood normal.        Assessment & Plan:  Troy Yu is a 63 y.o. male . Hyperlipidemia, unspecified hyperlipidemia type - Plan: Comprehensive metabolic panel with GFR  - Check labs and then adjust medication accordingly, continue same dose statin for now.  Close follow-up in the next month to review results and plan further.  Type 2 diabetes mellitus with hyperglycemia, without long-term current use of insulin  (HCC) - Plan: Hemoglobin A1c, Lipid panel, Microalbumin / creatinine urine ratio, Comprehensive  metabolic panel with GFR  - Tolerating current dose of Ozempic , check updated A1c, labs above and then adjust plan accordingly.  Timing of office visits discussed.  Denies barriers to care at this time.  Gastroesophageal reflux disease, unspecified whether esophagitis present  - Suspected GERD.  He would prefer not to take meds if possible.  Trigger foods discussed as above, and then trial of Pepcid if ineffective, followed by PPI if that is ineffective with close follow-up.  History of diabetes but less likely gastroparesis given symptoms above and less likely side effect of his GLP-1.  Recheck within 1 month.  RTC precautions if worsening.  Upper back pain  - Intermittent infrequent symptoms, could be related to cervical spine, spasm or neuronal impingement.  Reassuring exam at present.  Gentle range of motion recommended, with close follow-up in 1 month, RTC precautions if more frequent or worsening symptoms.  Hold on imaging at this time.  No orders of the defined types were placed in this encounter.  Patient Instructions   See foods below that can cause heartburn. You can try over the counter pepcid twice per day initially to see if effective. If not, we can discuss other medicine at follow up.   No change in diabetes meds or cholesterol med for now.  I will check your labs today and we can discuss those results further at follow-up in a month if needed but we will also contact you through MyChart regarding those results soon.  Occasional shock sensation in the upper back could be from a pinched nerve in the neck.  Your exam is reassuring today.  Try gentle range of motion, gentle stretches throughout the day, especially after warm shower to see if that loosens up some of the muscles and lessens how often the symptoms occur.  We will recheck this again in 1 month.  Thank you for coming in today and take care.   Return to the clinic or go to the nearest emergency room if any of your symptoms  worsen or new symptoms occur.    GERD in Adults: Diet Changes When you have gastroesophageal reflux disease (GERD), you may need to make changes to your diet. Choosing the right foods can help with your symptoms. Think about working with an expert in healthy eating called a dietitian. They can help you make healthy food choices. What are tips for following this plan? Reading food labels Look for foods that are low in saturated fat. Foods that may help with your symptoms include: Foods with less than 5% of daily value (DV) of fat. Foods with 0 grams of trans fat. Cooking [employer] in ways that don't use a lot of fat. These ways include: Baking. Steaming. Grilling. Broiling. To add flavor, try to use herbs that are low in spice and acidity. Avoid frying your food. Meal planning  Eat small meals often rather than eating 3 large meals each day. Eat your meals slowly in a place where you feel relaxed. If told by your health care provider, avoid: Foods that cause symptoms. Keep a food diary to keep track of foods that cause symptoms. Alcohol. Drinking a lot of liquid with meals. General instructions For 2-3 hours after you eat, avoid: Bending over. Exercise. Lying down. Chew sugar-free gum after meals. What foods should I eat? Eat a healthy diet. Try to include: Foods with high amounts of fiber. These include: Fruits and vegetables. Whole grains and beans. Low-fat dairy products. Lean meats, fish, and poultry. Egg whites. Foods that cause symptoms in someone else may not cause symptoms for you. Work with your provider to find foods  that are safe for you. The items listed above may not be all the foods and drinks you can have. Talk with a dietitian to learn more. The items listed above may not be a complete list of foods and beverages you can eat and drink. Contact a dietitian for more information. What foods should I avoid? Limiting some of these foods may help with your  symptoms. Each person is different. Talk with a dietitian or your provider to help you find the exact foods to avoid. Some of the foods to avoid may include: Fruits Fruits with a lot of acid in them. These may include citrus fruits, such as oranges, grapefruit, pineapple, and lemons. Vegetables Deep-fried vegetables, such as [place] fries. Vegetables, sauces, or toppings made with added fat and vegetables with acid in them. These may include tomatoes and tomato products, chili peppers, onions, garlic, and horseradish. Grains Pastries or quick breads with added fat. Meats and other proteins High-fat meats, such as fatty beef or pork, hot dogs, ribs, ham, sausage, salami, and bacon. Fried meat or protein, such as fried fish and fried chicken. Egg yolks. Fats and oils Butter. Margarine. Shortening. Ghee. Drinks Coffee and other drinks with caffeine in them. Fizzy and sugary drinks, such as soda and energy drinks. Fruit juice made with acidic fruits, such as orange or grapefruit. Tomato juice. Sweets and desserts Chocolate and cocoa. Donuts. Seasonings and condiments Mint, such as peppermint and spearmint. Condiments, herbs, or seasonings that cause symptoms. These may include curry, hot sauce, or vinegar-based salad dressings. The items listed above may not be all the foods and drinks you should avoid. Talk with a dietitian to learn more. Questions to ask your health care provider Changes to your diet and everyday life are often the first steps taken to manage symptoms of GERD. If these changes don't help, talk with your provider about taking medicines. Where to find more information [employer] G[employer]intestinal [employer]: aboutgerd.org This information is not intended to replace advice given to you by your health care provider. Make sure you discuss any questions you have with your health care provider. Document Revised: 06/27/2023 Document Reviewed: 01/11/2023 Elsevier  Patient Education  2024 Elsevier Inc.    Signed,   Reyes Pines, MD [facility], [facility] 06/03/24 4:01 PM

## 2024-06-04 LAB — LIPID PANEL
Cholesterol: 127 mg/dL (ref 0–200)
HDL: 42.4 mg/dL (ref >39.00)
LDL Cholesterol: 57 mg/dL (ref 0–99)
NonHDL: 84.74
Total CHOL/HDL Ratio: 3
Triglycerides: 138 mg/dL (ref 0.0–149.0)
VLDL: 27.6 mg/dL (ref 0.0–40.0)

## 2024-06-04 LAB — COMPREHENSIVE METABOLIC PANEL WITH GFR
ALT: 33 U/L (ref 0–53)
AST: 24 U/L (ref 0–37)
Albumin: 4.1 g/dL (ref 3.5–5.2)
Alkaline Phosphatase: 59 U/L (ref 39–117)
BUN: 14 mg/dL (ref 6–23)
CO2: 25 meq/L (ref 19–32)
Calcium: 9.3 mg/dL (ref 8.4–10.5)
Chloride: 103 meq/L (ref 96–112)
Creatinine, Ser: 0.94 mg/dL (ref 0.40–1.50)
GFR: 85.42 mL/min (ref >60.00)
Glucose, Bld: 141 mg/dL — ABNORMAL HIGH (ref 70–99)
Potassium: 4.1 meq/L (ref 3.5–5.1)
Sodium: 134 meq/L — ABNORMAL LOW (ref 135–145)
Total Bilirubin: 0.7 mg/dL (ref 0.2–1.2)
Total Protein: 7.6 g/dL (ref 6.0–8.3)

## 2024-06-04 LAB — HEMOGLOBIN A1C: Hgb A1c MFr Bld: 6.9 % — ABNORMAL HIGH (ref 4.6–6.5)

## 2024-06-05 LAB — MICROALBUMIN / CREATININE URINE RATIO
Creatinine,U: 115.2 mg/dL
Microalb Creat Ratio: 11.3 mg/g (ref 0.0–30.0)
Microalb, Ur: 1.3 mg/dL (ref 0.0–1.9)

## 2024-06-07 ENCOUNTER — Other Ambulatory Visit (HOSPITAL_COMMUNITY): Payer: Self-pay

## 2024-06-07 ENCOUNTER — Other Ambulatory Visit: Payer: Self-pay

## 2024-06-09 ENCOUNTER — Ambulatory Visit: Payer: Self-pay | Admitting: Family Medicine

## 2024-06-12 ENCOUNTER — Other Ambulatory Visit (HOSPITAL_COMMUNITY): Payer: Self-pay

## 2024-07-01 ENCOUNTER — Ambulatory Visit: Admitting: Family Medicine

## 2024-07-03 ENCOUNTER — Other Ambulatory Visit (HOSPITAL_COMMUNITY): Payer: Self-pay

## 2024-07-03 ENCOUNTER — Other Ambulatory Visit: Payer: Self-pay

## 2024-07-04 ENCOUNTER — Other Ambulatory Visit: Payer: Self-pay

## 2024-07-29 ENCOUNTER — Other Ambulatory Visit: Payer: Self-pay

## 2024-07-29 ENCOUNTER — Other Ambulatory Visit (HOSPITAL_COMMUNITY): Payer: Self-pay

## 2024-07-30 ENCOUNTER — Other Ambulatory Visit: Payer: Self-pay

## 2024-08-15 ENCOUNTER — Ambulatory Visit: Admitting: Family Medicine

## 2024-08-15 ENCOUNTER — Encounter: Payer: Self-pay | Admitting: Family Medicine

## 2024-08-15 VITALS — BP 124/66 | HR 87 | Temp 98.0°F | Ht 68.0 in | Wt 207.0 lb

## 2024-08-15 DIAGNOSIS — E785 Hyperlipidemia, unspecified: Secondary | ICD-10-CM

## 2024-08-15 DIAGNOSIS — E1165 Type 2 diabetes mellitus with hyperglycemia: Secondary | ICD-10-CM | POA: Diagnosis not present

## 2024-08-15 DIAGNOSIS — Z7985 Long-term (current) use of injectable non-insulin antidiabetic drugs: Secondary | ICD-10-CM

## 2024-08-15 DIAGNOSIS — K219 Gastro-esophageal reflux disease without esophagitis: Secondary | ICD-10-CM

## 2024-08-15 MED ORDER — ATORVASTATIN CALCIUM 20 MG PO TABS: 20.0000 mg | ORAL_TABLET | Freq: Every day | ORAL | 1 refills | Status: AC

## 2024-08-15 MED ORDER — OZEMPIC (2 MG/DOSE) 8 MG/3ML ~~LOC~~ SOPN: 2.0000 mg | PEN_INJECTOR | SUBCUTANEOUS | 1 refills | Status: AC

## 2024-08-15 MED ORDER — FAMOTIDINE 20 MG PO TABS: 20.0000 mg | ORAL_TABLET | Freq: Every day | ORAL | 1 refills | Status: AC

## 2024-08-15 NOTE — Progress Notes (Signed)
 "    CARDIOLOGY OFFICE NOTE  Date:  08/15/2024    Troy Yu Date of Birth: 1960-09-01 Medical Record #[id number]  PCP:  Troy Reyes SAUNDERS, MD  Cardiologist:  Troy Yu     History of Present Illness: 63 y.o. referred back to cardiology after 3 year absence by Troy Yu seen initially in ER June 2017 with subacute anterior MI complicated by distal embolization to right leg. Had a complicated RLE embolectomy requiring fasciotomy by Troy Troy Yu with long healing process including wound Vac.  Had DES to distal RCA and mid LAD with EF 30-35% large apical thrombus. Diagnosed with DM Has refused a lot of meds and care.   Still wants to stop meds Discussed importance of ACE  But he never filled script Seen by Troy Yu June 2019 told to stay on life long coumadin  but not taking LDL 165 10/21/21 and he has not taken statin   I think I'm doing ok and don't want to take unnecessary medication  Doesn't take DM meds well A1c elevated 8.4 10/21/21   19yo daughter and 6 yo son visit him He has 4 children Ex wife went back to [place] for a while but back and keeps daughter from him  He wants referral back to Troy Yu but he is retired   I discussed fact he should be on coreg /ARNI but he does not like taking too much medicine And  started on statin and Ozempic  Seeing Troy Reyes Troy in [facility]   He seems a bit more disheveled today and indicates he will lose his insurance at end of December and has not gotten medicare yet  Past Medical History:  Diagnosis Date   Diabetes (HCC)    Hypercholesteremia    LV (left ventricular) mural thrombus    NSTEMI (non-ST elevated myocardial infarction) Troy Yu)     Past Surgical History:  Procedure Laterality Date   APPLICATION OF WOUND VAC Right 02/22/2016   Procedure: APPLICATION OF Negative Pressure Dressing Right Lower Leg;  Surgeon: Troy LITTIE Laurence, MD;  Location: [facility] OR;  Service: Vascular;  Laterality: Right;   APPLICATION OF WOUND VAC Right  02/26/2016   Procedure: APPLICATION OF WOUND VAC, RIGHT MEDIAL LOWER LEG FASCIOTOMY SITE;  Surgeon: Troy JULIANNA Oris, MD;  Location: [facility] OR;  Service: Vascular;  Laterality: Right;   CARDIAC CATHETERIZATION N/A 03/09/2016   Procedure: Coronary Stent Intervention;  Surgeon: Troy DELENA Cage, MD;  Location: [facility] INVASIVE CV LAB;  Service: Cardiovascular;  Laterality: N/A;  distal RCA mid LAD   CARDIAC CATHETERIZATION N/A 03/09/2016   Procedure: Intravascular Pressure Wire/FFR Study;  Surgeon: Troy DELENA Cage, MD;  Location: [facility] INVASIVE CV LAB;  Service: Cardiovascular;  Laterality: N/A;  mid LAD   CARDIAC CATHETERIZATION N/A 03/09/2016   Procedure: Left Heart Cath and Coronary Angiography;  Surgeon: Troy DELENA Cage, MD;  Location: [facility] INVASIVE CV LAB;  Service: Cardiovascular;  Laterality: N/A;   EMBOLECTOMY Right 02/21/2016   Procedure: EMBOLECTOMY RIGHT COMMON FEMORAL ARTERY; FOUR COMPARTMENT FASCIOTOMY;  Surgeon: Troy LITTIE Laurence, MD;  Location: [facility] OR;  Service: Vascular;  Laterality: Right;   FASCIOTOMY CLOSURE Right 02/26/2016   Procedure: RIGHT LATERAL LOWER LEG FASCIOTOMY CLOSURE;  Surgeon: Troy JULIANNA Oris, MD;  Location: [facility] OR;  Service: Vascular;  Laterality: Right;   HEMATOMA EVACUATION Right 02/22/2016   Procedure: EVACUATION HEMATOMA With Placement of Negative Pressure Dressing;  Surgeon: Troy LITTIE Laurence, MD;  Location: [facility];  Service: Vascular;  Laterality: Right;  Medications: Current Outpatient Medications  Medication Sig Dispense Refill   aspirin  EC 81 MG tablet Take 1 tablet (81 mg total) by mouth daily.  Swallow whole. (Patient not taking: Reported on 06/03/2024) 90 tablet 3   atorvastatin  (LIPITOR ) 20 MG tablet Take 1 tablet (20 mg total) by mouth daily. 90 tablet 1   fluticasone  (FLONASE ) 50 MCG/ACT nasal spray Place 1 spray into both nostrils daily. 16 g 6   glucose blood (TRUE METRIX BLOOD GLUCOSE TEST) test strip Use 1 strip to check blood sugar 2 times daily 100 each 3   Latanoprostene  Bunod (VYZULTA ) 0.024 % SOLN Instill 1 drop into both eyes every evening as directed 2.5 mL 5   Semaglutide , 2 MG/DOSE, (OZEMPIC , 2 MG/DOSE,) 8 MG/3ML SOPN Inject 2 mg as directed once a week. 9 mL 1   timolol  (BETIMOL ) 0.5 % ophthalmic solution Place 1 drop into both eyes every morning as directed. 5 mL 2   timolol  (TIMOPTIC ) 0.5 % ophthalmic solution Place 1 drop into both eyes daily. 5 mL 2   No current facility-administered medications for this visit.    Allergies: Allergies  Allergen Reactions   Metformin     Other reaction(s): Dizziness (intolerance)    Social History: The patient  reports that he has never smoked. He has never used smokeless tobacco. He reports that he does not drink alcohol and does not use drugs.   Family History: The patient's family history includes Heart Problems in his father and mother.   Review of Systems: Please see the history of present illness.   Otherwise, the review of systems is positive for none.   All other systems are reviewed and negative.   Physical Exam: VS:  There were no vitals taken for this visit. SABRA  BMI There is no height or weight on file to calculate BMI.  Wt Readings from Last 3 Encounters:  06/03/24 202 lb 9.6 oz (91.9 kg)  07/03/23 207 lb 12.8 oz (94.3 kg)  03/27/23 206 lb 12.8 oz (93.8 kg)   Affect appropriate Healthy:  appears stated age HEENT: normal Neck supple with no adenopathy JVP normal no bruits no thyromegaly Lungs clear with no wheezing and good diaphragmatic motion Heart:  S1/S2 no murmur, no rub, gallop or click PMI enlarged  Abdomen: benighn, BS positve, no tenderness, no AAA no bruit.  No HSM or HJR Distal pulses intact with no bruits No edema Neuro non-focal Skin warm and dry No muscular weakness Fasciotomy scars RLE with some foot drop      LABORATORY DATA:  EKG:   .07/16/19  SR rate 78 old anterior MI   Lab Results  Component Value Date   WBC 9.6 07/03/2023   HGB 17.7 (H) 07/03/2023    HCT 49.7 07/03/2023   PLT 202 07/03/2023   GLUCOSE 141 (H) 06/03/2024   CHOL 127 06/03/2024   TRIG 138.0 06/03/2024   HDL 42.40 06/03/2024   LDLCALC 57 06/03/2024   ALT 33 06/03/2024   AST 24 06/03/2024   NA 134 (L) 06/03/2024   K 4.1 06/03/2024   CL 103 06/03/2024   CREATININE 0.94 06/03/2024   BUN 14 06/03/2024   CO2 25 06/03/2024   TSH 3.200 03/03/2016   INR 2.4 09/16/2019   HGBA1C 6.9 (H) 06/03/2024   MICROALBUR 1.3 06/03/2024    BNP (last 3 results) No results for input(s): BNP in the last 8760 hours.  ProBNP (last 3 results) No results for input(s): PROBNP in the last 8760  hours.   Other Studies Reviewed Today:  Cardiac Cath Conclusion from 02/2016    Prox RCA lesion, 10% stenosed. Dist LAD lesion, 60% stenosed. Dist RCA lesion, 90% stenosed. Post intervention, there is a 0% residual stenosis. Mid LAD lesion, 60% stenosed. Post intervention, there is a 0% residual stenosis.   1. Significant 2 vessel coronary artery disease involving the distal right coronary artery and mid LAD. The culprit for recent myocardial infarction seems to be the mid LAD stenosis which appears to be thrombotic and hazy in spite of being only 60% in severity. However, this was significant by FFR.   2. Moderately elevated left ventricular end-diastolic pressure. LV angiography was not performed due to LV thrombus.   3. Successful angioplasty and drug-eluting stent placement to the distal right coronary artery and mid left anterior descending artery.     Recommendations: Recommend treatment with aspirin , Plavix  and warfarin for one month. After one month, aspirin  can be discontinued. Heparin  can be resumed today 8 hours after sheath pull. Warfarin can be started. I switched from propranolol  to carvedilol . Continue treatment for cardiomyopathy and add an ACE inhibitor or ARB before hospital discharge. There was residual 60% distal LAD stenosis which was left to be treated medically.     Echo Study Conclusions from 12/19/17   Study Conclusions   - Left ventricle: The cavity size was normal. Systolic function was   moderately reduced. The estimated ejection fraction was in the   range of 35% to 40%. Doppler parameters are consistent with   abnormal left ventricular relaxation (grade 1 diastolic   dysfunction). Mild to moderate concentric and moderate focal   basal septal hypertrophy. - Regional wall motion abnormality: Akinesis of the mid   anteroseptal and apical myocardium; severe hypokinesis of the   apical anterior, apical inferior, and apical septal myocardium;   moderate hypokinesis of the apical lateral myocardium. - Aortic valve: There was mild regurgitation. - Aorta: Mild aortic root dilatation.  .   Assessment/Plan: 1. Anterior MI - July 2017  with associated LV dysfunction. Cardiac catheterization with DES to distal RCA and Mid LAD. Residual CAD noted. Would plan to treat medically.  81 mg ASA continue coreg  See below    2. Ischemic CM - with EF of 35-40% by TTE 01/20/22 he has been hesitant and non compliant with GDMT Update TTE    3. LV (left ventricular) mural thrombus (HCC) with embolization of right femoral artery resulting in limb ischemia s/p thrombectomy and fasciotomy with post operative course complicated by hematoma and bleeding s/p evacuation of hematoma with application of a VAC in the setting of therapeutic anti-coagulation. Seen by Troy. Oris. Finally healed  TTE 12/19/17 thrombus resolved  now off coumadin  TTE May 2023 no apical thormbus   4. Diabetes mellitus/Hyperglycemia -Hemoglobin A1c 8.5 .  f/u with primary currently not taking any meds  -supposed to be on Januvia Complicated by proteinuria, retinopathy On Ozempic  A1c 6.9 06/03/24   6. PVD:   Still with foot drop. F/u with [facility]    7. Cholesterol:  non compliant with statin  Lab Results  Component Value Date   LDLCALC 57 06/03/2024    TTE for ischemic cardiomyopathy   F/U in a year     Maude Emmer       "

## 2024-08-15 NOTE — Progress Notes (Signed)
 Subjective:  Patient ID: Troy Yu, male    DOB: July 31, 1961  Age: 63 y.o. MRN: [id number]  CC:  Chief Complaint  Patient presents with   Medical Management of Chronic Issues    Pt is doing well no concerns     HPI Troy Yu presents for   Diabetes: Last discussed in October.  He had not been seen for a while prior to that visit, denied any barriers to care.  Diabetes complicated by hyperglycemia, microalbuminuria, retinopathy, ischemic cardiomyopathy followed by cardiology.  Treated with Ozempic  with escalating doses up to 2 mg weekly that had been tolerated.  Aspirin  daily.  Lipitor  20 mg daily for statin, hyperlipidemia, off medication temporarily but had been back on meds for 1 month at his October visit. Still on ozempic  2mg  weekly. No home readings. No new side effects.  Microalbumin: normal ratio 06/03/24.  Optho, foot exam, pneumovax: Declines pneumonia, shingrix, tetanus.   Lab Results  Component Value Date   HGBA1C 6.9 (H) 06/03/2024   HGBA1C 6.9 (H) 03/27/2023   HGBA1C 8.5 (H) 08/31/2022   Lab Results  Component Value Date   MICROALBUR 1.3 06/03/2024   LDLCALC 57 06/03/2024   CREATININE 0.94 06/03/2024   Wt Readings from Last 3 Encounters:  08/15/24 207 lb (93.9 kg)  06/03/24 202 lb 9.6 oz (91.9 kg)  07/03/23 207 lb 12.8 oz (94.3 kg)    Heartburn/GERD Discussed at his October visit with intermittent sensation of acid taste in his mouth, waking up times at night.  Some heartburn during the day at times.  He declined meds.  Avoidance of trigger foods discussed.  Option of Pepcid  if ineffective, followed by PPI if that were ineffective.  Since last visit - oily foods cause symptoms. Still water brash feeling in middle of night at times - few times per month. No food changes.   Upper back/neck sensation Intermittent, infrequent symptoms with shocklike sensation discussed in October.  Possibly neuronal impingement or spasm from cervical spine.   Gentle range of motion recommended, reassuring exam at last visit.  Deferred imaging. Sx's resolved.    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, without long-term current use of insulin  (HCC) 11/16/2016   Chronic systolic congestive heart failure (HCC) 11/16/2016   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/2016   Noncompliance 11/16/2016   Old MI (myocardial infarction) 11/16/2016   Pure hypercholesterolemia 11/16/2016   History of fasciotomy 06/07/2016   Status post coronary artery stent placement    Acute combined systolic and diastolic heart failure (HCC)    Abnormal CXR    Abdominal pain    Chest pain    NSTEMI (non-ST elevated myocardial infarction) (HCC)    Fever    Thromboembolism (HCC)    Vascular occlusion    Pain    Anterior subendocardial MI (HCC) 02/21/2016   Right leg pain 02/21/2016   Non-STEMI (non-ST elevated myocardial infarction) (HCC) 02/21/2016   LV (left ventricular) mural thrombus 02/21/2016   Elevated troponin 02/21/2016   Abnormal EKG 02/21/2016  Acute MI (HCC) 02/21/2016   History of embolectomy 02/21/2016   Past Medical History:  Diagnosis Date   Diabetes (HCC)    Hypercholesteremia    LV (left ventricular) mural thrombus    NSTEMI (non-ST elevated myocardial infarction) [facility])    Past Surgical History:  Procedure Laterality Date   APPLICATION OF WOUND VAC Right 02/22/2016   Procedure: APPLICATION OF Negative Pressure Dressing Right Lower Leg;  Surgeon: Redell LITTIE Door, MD;   Location: [facility] OR;  Service: Vascular;  Laterality: Right;   APPLICATION OF WOUND VAC Right 02/26/2016   Procedure: APPLICATION OF WOUND VAC, RIGHT MEDIAL LOWER LEG FASCIOTOMY SITE;  Surgeon: Krystal JULIANNA Doing, MD;  Location: [facility] OR;  Service: Vascular;  Laterality: Right;   CARDIAC CATHETERIZATION N/A 03/09/2016   Procedure: Coronary Stent Intervention;  Surgeon: Deatrice DELENA Cage, MD;  Location: [facility] INVASIVE CV LAB;  Service: Cardiovascular;  Laterality: N/A;  distal RCA mid LAD   CARDIAC CATHETERIZATION N/A 03/09/2016   Procedure: Intravascular Pressure Wire/FFR Study;  Surgeon: Deatrice DELENA Cage, MD;  Location: [facility] INVASIVE CV LAB;  Service: Cardiovascular;  Laterality: N/A;  mid LAD   CARDIAC CATHETERIZATION N/A 03/09/2016   Procedure: Left Heart Cath and Coronary Angiography;  Surgeon: Deatrice DELENA Cage, MD;  Location: [facility] INVASIVE CV LAB;  Service: Cardiovascular;  Laterality: N/A;   EMBOLECTOMY Right 02/21/2016   Procedure: EMBOLECTOMY RIGHT COMMON FEMORAL ARTERY; FOUR COMPARTMENT FASCIOTOMY;  Surgeon: Redell LITTIE Door, MD;  Location: [facility] OR;  Service: Vascular;  Laterality: Right;   FASCIOTOMY CLOSURE Right 02/26/2016   Procedure: RIGHT LATERAL LOWER LEG FASCIOTOMY CLOSURE;  Surgeon: Krystal JULIANNA Doing, MD;  Location: [facility] OR;  Service: Vascular;  Laterality: Right;   HEMATOMA EVACUATION Right 02/22/2016   Procedure: EVACUATION HEMATOMA With Placement of Negative Pressure Dressing;  Surgeon: Redell LITTIE Door, MD;  Location: [facility] OR;  Service: Vascular;  Laterality: Right;   Allergies[1] Prior to Admission medications  Medication Sig Start Date End Date Taking? Authorizing Provider  atorvastatin  (LIPITOR ) 20 MG tablet Take 1 tablet (20 mg total) by mouth daily. 05/07/24  Yes Levora Reyes SAUNDERS, MD  fluticasone  (FLONASE ) 50 MCG/ACT nasal spray Place 1 spray into both nostrils daily. 07/25/22  Yes Levora Reyes SAUNDERS, MD  glucose blood (TRUE METRIX BLOOD GLUCOSE TEST) test strip Use 1 strip to check blood sugar 2 times daily 01/11/22   Yes Levora Reyes SAUNDERS, MD  Latanoprostene Bunod  (VYZULTA ) 0.024 % SOLN Instill 1 drop into both eyes every evening as directed 02/01/24  Yes   Semaglutide , 2 MG/DOSE, (OZEMPIC , 2 MG/DOSE,) 8 MG/3ML SOPN Inject 2 mg as directed once a week. 05/07/24  Yes Levora Reyes SAUNDERS, MD  timolol  (BETIMOL ) 0.5 % ophthalmic solution Place 1 drop into both eyes every morning as directed. 02/01/24  Yes   timolol  (TIMOPTIC ) 0.5 % ophthalmic solution Place 1 drop into both eyes daily. 02/22/24  Yes   aspirin  EC 81 MG tablet Take 1 tablet (81 mg total) by mouth daily.  Swallow whole. Patient not taking: Reported on 08/15/2024 11/09/22   Levora Reyes SAUNDERS, MD   Social History   Socioeconomic History   Marital status: Married    Spouse name: [name]   Number of children: 5   Years of education: COLLEGE   Highest education level: Not on file  Occupational History   Occupation: UNEMPLOYED  Tobacco Use   Smoking status: Never   Smokeless tobacco: Never  Vaping Use   Vaping status: Never Used  Substance and Sexual  Activity   Alcohol use: No   Drug use: No   Sexual activity: Not on file  Other Topics Concern   Not on file  Social History Narrative   Not on file   Social Drivers of Health   Tobacco Use: Low Risk (06/03/2024)   Patient History    Smoking Tobacco Use: Never    Smokeless Tobacco Use: Never    Passive Exposure: Not on file  Financial Resource Strain: Not on file  Food Insecurity: Not on file  Transportation Needs: Not on file  Physical Activity: Not on file  Stress: Not on file  Social Connections: Not on file  Intimate Partner Violence: Not on file  Depression (PHQ2-9): Low Risk (08/15/2024)   Depression (PHQ2-9)    PHQ-2 Score: 0  Alcohol Screen: Not on file  Housing: Not on file  Utilities: Not on file  Health Literacy: Not on file    Review of Systems   Objective:   Vitals:   08/15/24 1533  BP: 124/66  Pulse: 87  Temp: 98 F (36.7 C)  TempSrc: Temporal  SpO2: 99%   Weight: 207 lb (93.9 kg)  Height: 5' 8 (1.727 m)     Physical Exam Vitals reviewed.  Constitutional:      Appearance: He is well-developed.  HENT:     Head: Normocephalic and atraumatic.  Neck:     Vascular: No carotid bruit or JVD.  Cardiovascular:     Rate and Rhythm: Normal rate and regular rhythm.     Heart sounds: Normal heart sounds. No murmur heard. Pulmonary:     Effort: Pulmonary effort is normal.     Breath sounds: Normal breath sounds. No rales.  Musculoskeletal:     Right lower leg: No edema.     Left lower leg: No edema.  Skin:    General: Skin is warm and dry.  Neurological:     Mental Status: He is alert and oriented to person, place, and time.  Psychiatric:        Mood and Affect: Mood normal.        Assessment & Plan:  Troy Yu is a 63 y.o. male . Type 2 diabetes mellitus with hyperglycemia, without long-term current use of insulin  (HCC) - Plan: Semaglutide , 2 MG/DOSE, (OZEMPIC , 2 MG/DOSE,) 8 MG/3ML SOPN  - Stable A1c on most recent testing, tolerating current dose of Ozempic , will continue same.  Heartburn less likely related to Ozempic , see information below.  RTC precautions if persistent or worsening symptoms.  20-month follow-up as stable on last A1c.  Gastroesophageal reflux disease, unspecified whether esophagitis present  - Still with intermittent symptoms, typical reflux symptoms, no red flags on history.  Typical water brash sensation with lying down.  Dietary choices discussed including avoidance of late-night meals, large meals and then laying down.  Famotidine  as needed initially with trigger foods or if he does eat late, but handout was given again on trigger food avoidance, treatment for GERD.  Option of nightly famotidine  if persistent symptoms with RTC precautions given.  Hyperlipidemia, unspecified hyperlipidemia type - Plan: atorvastatin  (LIPITOR ) 20 MG tablet  - Tolerating current dose Lipitor , stable lipids on most recent  labs, continue same.  Meds ordered this encounter  Medications   famotidine  (PEPCID ) 20 MG tablet    Sig: Take 1 tablet (20 mg total) by mouth at bedtime.    Dispense:  90 tablet    Refill:  1   atorvastatin  (LIPITOR ) 20 MG tablet  Sig: Take 1 tablet (20 mg total) by mouth daily.    Dispense:  90 tablet    Refill:  1   Semaglutide , 2 MG/DOSE, (OZEMPIC , 2 MG/DOSE,) 8 MG/3ML SOPN    Sig: Inject 2 mg as directed once a week.    Dispense:  9 mL    Refill:  1   Patient Instructions  Glad to hear the upper back symptoms have resolved.  Follow-up if those symptoms return.  The sensation in the back of your throat at night is likely reflux or heartburn, and you are tasting some of the stomach acid.  See information below on heartburn as well as foods to avoid and ways to avoid heartburn including how you eat and when you eat.  Late-night meals can make things worse, laying down right after eating can also make things worse.  If you are having to eat later at night, make sure you take a famotidine , the new heartburn medicine I put you on today.  If you eat some of the foods that have caused heartburn symptoms in the past, I recommend taking a famotidine  at that time as well.  You can take that medication every night to prevent heartburn unless you feel like you can control the symptoms with just taking that intermittently.  I will leave that up to you.  If any persistent or worsening symptoms please be seen.  Diabetes levels were controlled at your last visit.  No change in meds for now.  Will recheck labs in 4 months.  I am happy to see you sooner if any new symptoms or concerns.  Take care!  GERD in Adults: What to Know  Gastroesophageal reflux (GER) is when acid from your stomach flows up into your esophagus. Your esophagus is the part of your body that moves food from your mouth to your stomach. Normally, food goes down and stays in your stomach to be digested. But with GER, food and stomach  acid may go back up. You may have a disease called gastroesophageal reflux disease (GERD) if the reflux: Happens often. Causes very bad symptoms. Makes your esophagus sore and swollen. Over time, GERD can make small holes called ulcers in the lining of your esophagus. What are the causes? GERD is caused by a problem with the muscle between your esophagus and stomach. This muscle is called the lower esophageal sphincter (LES). When it's weak or not normal, it doesn't close like it should. This means food and stomach acid can go back up into your esophagus. The muscle can be weak if: You smoke or use products with tobacco in them. You're pregnant. You have a type of hernia called a hiatal hernia. You eat certain foods and drinks. These include: Alcohol. Coffee. Chocolate. Onions. Peppermint. What increases the risk? Being overweight. Having a disease that affects your connective tissue. Taking NSAIDs, such as ibuprofen. What are the signs or symptoms? Heartburn. Trouble swallowing. Pain when you swallow. The feeling of having a lump in your throat. A bitter taste in your mouth. Bad breath. Having an upset or bloated stomach. Burping. Chest pain. Other conditions can also cause chest pain. Make sure you see your health care provider if you have chest pain. Wheezing. This is when you make high-pitched whistling sounds when you breathe, most often when you breathe out. A long-term cough or a cough at night. How is this diagnosed? GERD may be diagnosed based on your medical history and a physical exam. You may also have  tests. These may include: An endoscopy. This test looks at your stomach and esophagus with a small camera. A barium swallow test. This shows the shape and size of your esophagus and how well it's working. Tests of your esophagus to check for: Acid levels. Pressure. How is this treated? Treatment may depend on how bad your symptoms are. It may include: Changes to  your diet and daily life. Medicines. Surgery. Follow these instructions at home: Eating and drinking Follow an eating plan as told by your provider. You may need to avoid certain foods and drinks. These may include: Coffee and tea, with or without caffeine. Alcohol. Energy drinks and sports drinks. Fizzy drinks or sodas. Chocolate and cocoa. Peppermint and mint flavorings. Garlic and onions. Horseradish. Spicy and acidic foods. These include: Peppers. Chili powder and curry powder. Vinegar. Hot sauces and BBQ sauce. Citrus fruits and juices. These include: Oranges. Lemons. Limes. Tomato-based foods. These include: Red sauce and pizza with red sauce. Chili. Salsa. Fried and fatty foods. These include: Donuts. French fries. Potato chips. High-fat dressings. High-fat meats. These include: Hot dogs and sausage. Rib eye steak. Ham and bacon. High-fat dairy items. These include: Whole milk. Butter. Cream cheese. Eat small meals often. Avoid eating big meals. Avoid drinking lots of liquid with your meals. Try not to eat meals during the 2-3 hours before bedtime. Try not to lie down right after you eat. Do not exercise right after you eat. Lifestyle  If you're overweight, lose an amount of weight that's healthy for you. Ask your provider about a safe weight loss goal. Do not smoke, vape, or use nicotine or tobacco. Wear loose clothes. Do not wear things that are tight around your waist. When you sleep, try: Raising the head of your bed about 6 inches (15 cm). You can use a wedge to do this. Lying down on your left side. Try to lower your stress. If you need help doing this, ask your provider. General instructions Take your medicines only as told. Do not take aspirin  or ibuprofen unless you're told to. Watch for any changes in your symptoms. Do not bend over if it makes your symptoms worse. Contact a health care provider if: You have new symptoms. You have  trouble: Drinking. Swallowing. Eating. It hurts to swallow. You have wheezing. You have a cough that won't go away. Your voice is hoarse. Your symptoms don't get better with treatment. Get help right away if: You have pain all of a sudden in your: Arm. Neck. Jaw. Teeth. Back. You feel sweaty, dizzy, or light-headed all of a sudden. You faint. You have chest pain or shortness of breath. You vomit and the vomit is: Green, yellow, or black. Looks like blood or coffee grounds. Your poop is red, bloody, or black. These symptoms may be an emergency. Call 911 right away. Do not wait to see if the symptoms will go away. Do not drive yourself to the hospital. This information is not intended to replace advice given to you by your health care provider. Make sure you discuss any questions you have with your health care provider. Document Revised: 06/27/2023 Document Reviewed: 01/11/2023 Elsevier Patient Education  2024 [employer]    Signed,   Reyes Pines, MD [facility], [facility] 08/15/2024 4:07 PM      [1]  Allergies Allergen Reactions   Metformin     Other reaction(s): Dizziness (intolerance)

## 2024-08-15 NOTE — Patient Instructions (Addendum)
 Glad to hear the upper back symptoms have resolved.  Follow-up if those symptoms return.  The sensation in the back of your throat at night is likely reflux or heartburn, and you are tasting some of the stomach acid.  See information below on heartburn as well as foods to avoid and ways to avoid heartburn including how you eat and when you eat.  Late-night meals can make things worse, laying down right after eating can also make things worse.  If you are having to eat later at night, make sure you take a famotidine , the new heartburn medicine I put you on today.  If you eat some of the foods that have caused heartburn symptoms in the past, I recommend taking a famotidine  at that time as well.  You can take that medication every night to prevent heartburn unless you feel like you can control the symptoms with just taking that intermittently.  I will leave that up to you.  If any persistent or worsening symptoms please be seen.  Diabetes levels were controlled at your last visit.  No change in meds for now.  Will recheck labs in 4 months.  I am happy to see you sooner if any new symptoms or concerns.  Take care!  GERD in Adults: What to Know  Gastroesophageal reflux (GER) is when acid from your stomach flows up into your esophagus. Your esophagus is the part of your body that moves food from your mouth to your stomach. Normally, food goes down and stays in your stomach to be digested. But with GER, food and stomach acid may go back up. You may have a disease called gastroesophageal reflux disease (GERD) if the reflux: Happens often. Causes very bad symptoms. Makes your esophagus sore and swollen. Over time, GERD can make small holes called ulcers in the lining of your esophagus. What are the causes? GERD is caused by a problem with the muscle between your esophagus and stomach. This muscle is called the lower esophageal sphincter (LES). When it's weak or not normal, it doesn't close like it should. This  means food and stomach acid can go back up into your esophagus. The muscle can be weak if: You smoke or use products with tobacco in them. You're pregnant. You have a type of hernia called a hiatal hernia. You eat certain foods and drinks. These include: Alcohol. Coffee. Chocolate. Onions. Peppermint. What increases the risk? Being overweight. Having a disease that affects your connective tissue. Taking NSAIDs, such as ibuprofen. What are the signs or symptoms? Heartburn. Trouble swallowing. Pain when you swallow. The feeling of having a lump in your throat. A bitter taste in your mouth. Bad breath. Having an upset or bloated stomach. Burping. Chest pain. Other conditions can also cause chest pain. Make sure you see your health care provider if you have chest pain. Wheezing. This is when you make high-pitched whistling sounds when you breathe, most often when you breathe out. A long-term cough or a cough at night. How is this diagnosed? GERD may be diagnosed based on your medical history and a physical exam. You may also have tests. These may include: An endoscopy. This test looks at your stomach and esophagus with a small camera. A barium swallow test. This shows the shape and size of your esophagus and how well it's working. Tests of your esophagus to check for: Acid levels. Pressure. How is this treated? Treatment may depend on how bad your symptoms are. It may include: Changes to  your diet and daily life. Medicines. Surgery. Follow these instructions at home: Eating and drinking Follow an eating plan as told by your provider. You may need to avoid certain foods and drinks. These may include: Coffee and tea, with or without caffeine. Alcohol. Energy drinks and sports drinks. Fizzy drinks or sodas. Chocolate and cocoa. Peppermint and mint flavorings. Garlic and onions. Horseradish. Spicy and acidic foods. These include: Peppers. Chili powder and curry  powder. Vinegar. Hot sauces and BBQ sauce. Citrus fruits and juices. These include: Oranges. Lemons. Limes. Tomato-based foods. These include: Red sauce and pizza with red sauce. Chili. Salsa. Fried and fatty foods. These include: Donuts. French fries. Potato chips. High-fat dressings. High-fat meats. These include: Hot dogs and sausage. Rib eye steak. Ham and bacon. High-fat dairy items. These include: Whole milk. Butter. Cream cheese. Eat small meals often. Avoid eating big meals. Avoid drinking lots of liquid with your meals. Try not to eat meals during the 2-3 hours before bedtime. Try not to lie down right after you eat. Do not exercise right after you eat. Lifestyle  If you're overweight, lose an amount of weight that's healthy for you. Ask your provider about a safe weight loss goal. Do not smoke, vape, or use nicotine or tobacco. Wear loose clothes. Do not wear things that are tight around your waist. When you sleep, try: Raising the head of your bed about 6 inches (15 cm). You can use a wedge to do this. Lying down on your left side. Try to lower your stress. If you need help doing this, ask your provider. General instructions Take your medicines only as told. Do not take aspirin  or ibuprofen unless you're told to. Watch for any changes in your symptoms. Do not bend over if it makes your symptoms worse. Contact a health care provider if: You have new symptoms. You have trouble: Drinking. Swallowing. Eating. It hurts to swallow. You have wheezing. You have a cough that won't go away. Your voice is hoarse. Your symptoms don't get better with treatment. Get help right away if: You have pain all of a sudden in your: Arm. Neck. Jaw. Teeth. Back. You feel sweaty, dizzy, or light-headed all of a sudden. You faint. You have chest pain or shortness of breath. You vomit and the vomit is: Green, yellow, or black. Looks like blood or coffee  grounds. Your poop is red, bloody, or black. These symptoms may be an emergency. Call 911 right away. Do not wait to see if the symptoms will go away. Do not drive yourself to the hospital. This information is not intended to replace advice given to you by your health care provider. Make sure you discuss any questions you have with your health care provider. Document Revised: 06/27/2023 Document Reviewed: 01/11/2023 Elsevier Patient Education  2024 [employer].

## 2024-08-26 ENCOUNTER — Other Ambulatory Visit (HOSPITAL_COMMUNITY): Payer: Self-pay

## 2024-08-26 ENCOUNTER — Ambulatory Visit: Attending: Cardiovascular Disease | Admitting: Cardiovascular Disease

## 2024-08-26 ENCOUNTER — Encounter: Payer: Self-pay | Admitting: *Deleted

## 2024-08-26 ENCOUNTER — Other Ambulatory Visit: Payer: Self-pay | Admitting: Family Medicine

## 2024-08-26 VITALS — BP 131/78 | HR 93 | Ht 69.6 in | Wt 204.8 lb

## 2024-08-26 DIAGNOSIS — I739 Peripheral vascular disease, unspecified: Secondary | ICD-10-CM

## 2024-08-26 DIAGNOSIS — E782 Mixed hyperlipidemia: Secondary | ICD-10-CM

## 2024-08-26 DIAGNOSIS — R062 Wheezing: Secondary | ICD-10-CM

## 2024-08-26 DIAGNOSIS — I255 Ischemic cardiomyopathy: Secondary | ICD-10-CM | POA: Diagnosis not present

## 2024-08-26 NOTE — Patient Instructions (Signed)
 Medication Instructions:  Your physician recommends that you continue on your current medications as directed. Please refer to the Current Medication list given to you today.  *If you need a refill on your cardiac medications before your next appointment, please call your pharmacy*  Lab Work: none If you have labs (blood work) drawn today and your tests are completely normal, you will receive your results only by: MyChart Message (if you have MyChart) OR A paper copy in the mail If you have any lab test that is abnormal or we need to change your treatment, we will call you to review the results.  Testing/Procedures: Echo  Your physician has requested that you have an echocardiogram. Echocardiography is a painless test that uses sound waves to create images of your heart. It provides your doctor with information about the size and shape of your heart and how well your hearts chambers and valves are working. This procedure takes approximately one hour. There are no restrictions for this procedure. Please do NOT wear cologne, perfume, aftershave, or lotions (deodorant is allowed). Please arrive 15 minutes prior to your appointment time.  Please note: We ask at that you not bring children with you during ultrasound (echo/ vascular) testing. Due to room size and safety concerns, children are not allowed in the ultrasound rooms during exams. Our front office staff cannot provide observation of children in our lobby area while testing is being conducted. An adult accompanying a patient to their appointment will only be allowed in the ultrasound room at the discretion of the ultrasound technician under special circumstances. We apologize for any inconvenience.   Follow-Up: At [facility], you and your health needs are our priority.  As part of our continuing mission to provide you with exceptional heart care, our providers are all part of one team.  This team includes your primary  Cardiologist (physician) and Advanced Practice Providers or APPs (Physician Assistants and Nurse Practitioners) who all work together to provide you with the care you need, when you need it.  Your next appointment:   1 year  Provider:   Dr. Delford  We recommend signing up for the patient portal called MyChart.  Sign up information is provided on this After Visit Summary.  MyChart is used to connect with patients for Virtual Visits (Telemedicine).  Patients are able to view lab/test results, encounter notes, upcoming appointments, etc.  Non-urgent messages can be sent to your provider as well.   To learn more about what you can do with MyChart, go to [email or web address].   Other Instructions none

## 2024-08-27 ENCOUNTER — Other Ambulatory Visit: Payer: Self-pay

## 2024-08-27 ENCOUNTER — Other Ambulatory Visit: Payer: Self-pay | Admitting: Family Medicine

## 2024-08-27 DIAGNOSIS — E1165 Type 2 diabetes mellitus with hyperglycemia: Secondary | ICD-10-CM

## 2024-08-27 MED ORDER — FLUTICASONE PROPIONATE 50 MCG/ACT NA SUSP
1.0000 | Freq: Every day | NASAL | 6 refills | Status: AC
  Filled 2024-08-27: qty 16, 60d supply, fill #0

## 2024-08-28 ENCOUNTER — Ambulatory Visit

## 2024-08-28 ENCOUNTER — Ambulatory Visit: Attending: Cardiology

## 2024-08-28 DIAGNOSIS — I255 Ischemic cardiomyopathy: Secondary | ICD-10-CM | POA: Diagnosis not present

## 2024-08-28 LAB — ECHOCARDIOGRAM COMPLETE
AR max vel: 3.03 cm2
AV Peak grad: 4.1 mmHg
Ao pk vel: 1.01 m/s
Area-P 1/2: 5.16 cm2
Calc EF: 50.4 %
P 1/2 time: 567 ms
S' Lateral: 3.5 cm
Single Plane A2C EF: 34.7 %
Single Plane A4C EF: 55.8 %

## 2024-08-28 MED ORDER — PERFLUTREN LIPID MICROSPHERE
1.0000 mL | INTRAVENOUS | Status: AC | PRN
  Administered 2024-08-28: 6 mL via INTRAVENOUS

## 2024-08-30 MED ORDER — TIMOLOL MALEATE 0.5 % OP SOLN
1.0000 [drp] | Freq: Every day | OPHTHALMIC | 2 refills | Status: AC
  Filled 2024-08-30: qty 5, 30d supply, fill #0
  Filled 2024-10-02: qty 5, 30d supply, fill #1

## 2024-08-30 MED ORDER — LATANOPROSTENE BUNOD 0.024 % OP SOLN
Freq: Every evening | OPHTHALMIC | 5 refills | Status: AC
  Filled 2024-08-30: qty 2.5, 25d supply, fill #0
  Filled 2024-10-01 – 2024-10-02 (×2): qty 2.5, 25d supply, fill #1

## 2024-09-01 ENCOUNTER — Ambulatory Visit: Payer: Self-pay | Admitting: Cardiovascular Disease

## 2024-09-03 ENCOUNTER — Other Ambulatory Visit (HOSPITAL_COMMUNITY): Payer: Self-pay

## 2024-09-03 ENCOUNTER — Other Ambulatory Visit: Payer: Self-pay

## 2024-09-03 ENCOUNTER — Telehealth (HOSPITAL_COMMUNITY): Payer: Self-pay

## 2024-09-03 NOTE — Telephone Encounter (Signed)
 PA request has been Received. New Encounter has been or will be created for follow up. For additional info see Pharmacy Prior Auth telephone encounter from 09/03/24.

## 2024-09-03 NOTE — Telephone Encounter (Signed)
 Pharmacy Patient Advocate Encounter   Received notification from Pt Calls Messages that prior authorization for Ozempic  (2 MG/DOSE) 8MG /3ML pen-injectors  is required/requested.   Insurance verification completed.   The patient is insured through [employer]   Per test claim: PA required; PA submitted to above mentioned insurance via Latent Key/confirmation #/EOC [id number] Status is pending

## 2024-09-03 NOTE — Telephone Encounter (Signed)
 Medication: Ozempic  2mg  Able to fill? No Prior authorization required? Yes Co-pay before assistance: N/A

## 2024-09-04 ENCOUNTER — Other Ambulatory Visit: Payer: Self-pay

## 2024-09-04 ENCOUNTER — Other Ambulatory Visit (HOSPITAL_COMMUNITY): Payer: Self-pay

## 2024-09-04 NOTE — Telephone Encounter (Signed)
 Pharmacy Patient Advocate Encounter  Received notification from [facility] [employer] that Prior Authorization for Ozempic  (2 MG/DOSE) 8MG /3ML pen-injectors  has been APPROVED from 09/03/24 to 09/03/25. Ran test claim, Copay is $20. This test claim was processed through [facility]- copay amounts may vary at other pharmacies due to pharmacy/plan contracts, or as the patient moves through the different stages of their insurance plan.   PA #/Case ID/Reference #: [id number]

## 2024-10-01 ENCOUNTER — Other Ambulatory Visit (HOSPITAL_COMMUNITY): Payer: Self-pay

## 2024-10-02 ENCOUNTER — Other Ambulatory Visit (HOSPITAL_COMMUNITY): Payer: Self-pay

## 2024-10-03 ENCOUNTER — Other Ambulatory Visit: Payer: Self-pay

## 2024-10-03 ENCOUNTER — Encounter: Payer: Self-pay | Admitting: Pharmacist

## 2024-10-04 ENCOUNTER — Other Ambulatory Visit: Payer: Self-pay

## 2024-12-12 ENCOUNTER — Ambulatory Visit: Admitting: Family Medicine
